# Patient Record
Sex: Female | Born: 1955 | Race: Black or African American | Hispanic: No | Marital: Married | Smoking: Current every day smoker
Health system: Southern US, Community
[De-identification: ages and names within clinical notes are randomized; demographics above are authoritative.]

## PROBLEM LIST (undated history)

## (undated) DIAGNOSIS — M797 Fibromyalgia: Secondary | ICD-10-CM

## (undated) DIAGNOSIS — I2510 Atherosclerotic heart disease of native coronary artery without angina pectoris: Secondary | ICD-10-CM

## (undated) DIAGNOSIS — J45909 Unspecified asthma, uncomplicated: Secondary | ICD-10-CM

## (undated) DIAGNOSIS — E119 Type 2 diabetes mellitus without complications: Secondary | ICD-10-CM

## (undated) DIAGNOSIS — I10 Essential (primary) hypertension: Secondary | ICD-10-CM

## (undated) HISTORY — PX: BREAST MASS EXCISION: SHX1267

## (undated) HISTORY — DX: Fibromyalgia: M79.7

## (undated) HISTORY — PX: BREAST EXCISIONAL BIOPSY: SUR124

---

## 1988-11-15 DIAGNOSIS — E119 Type 2 diabetes mellitus without complications: Secondary | ICD-10-CM

## 1988-11-15 HISTORY — DX: Type 2 diabetes mellitus without complications: E11.9

## 2005-11-16 ENCOUNTER — Other Ambulatory Visit: Payer: Self-pay

## 2005-11-16 ENCOUNTER — Emergency Department: Payer: Self-pay | Admitting: Emergency Medicine

## 2006-05-30 ENCOUNTER — Other Ambulatory Visit: Payer: Self-pay

## 2006-05-30 ENCOUNTER — Emergency Department: Payer: Self-pay | Admitting: Emergency Medicine

## 2006-08-28 ENCOUNTER — Emergency Department: Payer: Self-pay | Admitting: Emergency Medicine

## 2006-08-31 ENCOUNTER — Inpatient Hospital Stay: Payer: Self-pay | Admitting: Internal Medicine

## 2006-11-11 ENCOUNTER — Emergency Department: Payer: Self-pay | Admitting: Emergency Medicine

## 2006-12-28 ENCOUNTER — Ambulatory Visit: Payer: Self-pay | Admitting: Nurse Practitioner

## 2007-02-05 ENCOUNTER — Other Ambulatory Visit: Payer: Self-pay

## 2007-02-06 ENCOUNTER — Inpatient Hospital Stay: Payer: Self-pay | Admitting: Internal Medicine

## 2007-02-07 ENCOUNTER — Inpatient Hospital Stay: Payer: Self-pay | Admitting: Unknown Physician Specialty

## 2007-02-20 ENCOUNTER — Emergency Department: Payer: Self-pay | Admitting: Emergency Medicine

## 2007-04-25 ENCOUNTER — Emergency Department: Payer: Self-pay | Admitting: Emergency Medicine

## 2007-09-14 ENCOUNTER — Emergency Department: Payer: Self-pay

## 2007-09-14 ENCOUNTER — Other Ambulatory Visit: Payer: Self-pay

## 2007-10-25 DIAGNOSIS — I10 Essential (primary) hypertension: Secondary | ICD-10-CM | POA: Diagnosis present

## 2007-11-16 HISTORY — PX: CHOLECYSTECTOMY: SHX55

## 2007-12-28 ENCOUNTER — Ambulatory Visit: Payer: Self-pay | Admitting: Family Medicine

## 2008-01-02 ENCOUNTER — Ambulatory Visit: Payer: Self-pay | Admitting: Gastroenterology

## 2008-04-05 ENCOUNTER — Ambulatory Visit: Payer: Self-pay | Admitting: Family Medicine

## 2008-04-17 ENCOUNTER — Other Ambulatory Visit: Payer: Self-pay

## 2008-04-18 ENCOUNTER — Observation Stay: Payer: Self-pay | Admitting: Internal Medicine

## 2008-04-18 ENCOUNTER — Inpatient Hospital Stay: Payer: Self-pay | Admitting: Psychiatry

## 2008-07-14 ENCOUNTER — Other Ambulatory Visit: Payer: Self-pay

## 2008-07-14 ENCOUNTER — Emergency Department: Payer: Self-pay | Admitting: Emergency Medicine

## 2008-07-19 DIAGNOSIS — E785 Hyperlipidemia, unspecified: Secondary | ICD-10-CM | POA: Insufficient documentation

## 2008-08-08 ENCOUNTER — Emergency Department: Payer: Self-pay | Admitting: Emergency Medicine

## 2008-09-18 ENCOUNTER — Ambulatory Visit: Payer: Self-pay | Admitting: Pain Medicine

## 2008-10-09 ENCOUNTER — Emergency Department: Payer: Self-pay | Admitting: Emergency Medicine

## 2008-10-29 ENCOUNTER — Emergency Department: Payer: Self-pay | Admitting: Internal Medicine

## 2008-10-30 ENCOUNTER — Ambulatory Visit: Payer: Self-pay | Admitting: Internal Medicine

## 2008-11-18 ENCOUNTER — Inpatient Hospital Stay: Payer: Self-pay | Admitting: *Deleted

## 2008-11-19 ENCOUNTER — Inpatient Hospital Stay: Payer: Self-pay | Admitting: Psychiatry

## 2009-01-31 ENCOUNTER — Ambulatory Visit: Payer: Self-pay | Admitting: Family Medicine

## 2009-04-10 ENCOUNTER — Ambulatory Visit: Payer: Self-pay | Admitting: Family Medicine

## 2009-06-06 ENCOUNTER — Emergency Department: Payer: Self-pay | Admitting: Emergency Medicine

## 2009-06-15 ENCOUNTER — Emergency Department: Payer: Self-pay | Admitting: Unknown Physician Specialty

## 2009-08-07 ENCOUNTER — Ambulatory Visit: Payer: Self-pay | Admitting: Gastroenterology

## 2009-11-05 ENCOUNTER — Inpatient Hospital Stay: Payer: Self-pay | Admitting: Student

## 2010-02-22 ENCOUNTER — Inpatient Hospital Stay: Payer: Self-pay | Admitting: Internal Medicine

## 2010-07-01 ENCOUNTER — Ambulatory Visit: Payer: Self-pay | Admitting: Family Medicine

## 2010-09-14 ENCOUNTER — Emergency Department: Payer: Self-pay | Admitting: Emergency Medicine

## 2011-04-16 ENCOUNTER — Emergency Department: Payer: Self-pay | Admitting: Unknown Physician Specialty

## 2011-04-17 ENCOUNTER — Emergency Department: Payer: Self-pay | Admitting: Internal Medicine

## 2011-12-20 ENCOUNTER — Emergency Department: Payer: Self-pay | Admitting: Emergency Medicine

## 2011-12-20 LAB — CBC
HCT: 42.8 % (ref 35.0–47.0)
HGB: 14.7 g/dL (ref 12.0–16.0)
MCH: 31.1 pg (ref 26.0–34.0)
MCHC: 34.4 g/dL (ref 32.0–36.0)
MCV: 91 fL (ref 80–100)
Platelet: 307 10*3/uL (ref 150–440)
RBC: 4.73 10*6/uL (ref 3.80–5.20)
RDW: 14.5 % (ref 11.5–14.5)
WBC: 8.7 10*3/uL (ref 3.6–11.0)

## 2011-12-20 LAB — URINALYSIS, COMPLETE
Bacteria: NONE SEEN
Bilirubin,UR: NEGATIVE
Blood: NEGATIVE
Glucose,UR: >=500 mg/dL (ref 0–75)
Leukocyte Esterase: NEGATIVE
Nitrite: NEGATIVE
Ph: 7 (ref 4.5–8.0)
Protein: NEGATIVE
RBC,UR: <1 /HPF (ref 0–5)
Specific Gravity: 1.018 (ref 1.003–1.030)
Squamous Epithelial: 3
WBC UR: <1 /HPF (ref 0–5)

## 2011-12-20 LAB — COMPREHENSIVE METABOLIC PANEL
Albumin: 3.6 g/dL (ref 3.4–5.0)
Alkaline Phosphatase: 113 U/L (ref 50–136)
Anion Gap: 6 — ABNORMAL LOW (ref 7–16)
BUN: 8 mg/dL (ref 7–18)
Bilirubin,Total: 0.3 mg/dL (ref 0.2–1.0)
Calcium, Total: 9.1 mg/dL (ref 8.5–10.1)
Chloride: 103 mmol/L (ref 98–107)
Co2: 28 mmol/L (ref 21–32)
Creatinine: 0.64 mg/dL (ref 0.60–1.30)
EGFR (African American): >60
EGFR (Non-African Amer.): >60
Glucose: 212 mg/dL — ABNORMAL HIGH (ref 65–99)
Osmolality: 278 (ref 275–301)
Potassium: 3.9 mmol/L (ref 3.5–5.1)
SGOT(AST): 16 U/L (ref 15–37)
SGPT (ALT): 20 U/L
Sodium: 137 mmol/L (ref 136–145)
Total Protein: 7.8 g/dL (ref 6.4–8.2)

## 2011-12-20 LAB — LIPASE, BLOOD: Lipase: 334 U/L (ref 73–393)

## 2011-12-20 LAB — TROPONIN I: Troponin-I: <0.02 ng/mL

## 2012-08-16 ENCOUNTER — Ambulatory Visit: Payer: Self-pay | Admitting: Family Medicine

## 2013-04-18 DIAGNOSIS — E1140 Type 2 diabetes mellitus with diabetic neuropathy, unspecified: Secondary | ICD-10-CM | POA: Insufficient documentation

## 2013-07-14 ENCOUNTER — Emergency Department: Payer: Self-pay | Admitting: Emergency Medicine

## 2013-07-14 LAB — URINALYSIS, COMPLETE
Bacteria: NONE SEEN
Bilirubin,UR: NEGATIVE
Glucose,UR: >=500 mg/dL (ref 0–75)
Nitrite: NEGATIVE
Ph: 6 (ref 4.5–8.0)
Protein: NEGATIVE
RBC,UR: 4 /HPF (ref 0–5)
Specific Gravity: 1.025 (ref 1.003–1.030)
Squamous Epithelial: 1
WBC UR: 19 /HPF (ref 0–5)

## 2013-07-14 LAB — COMPREHENSIVE METABOLIC PANEL
Albumin: 3.7 g/dL (ref 3.4–5.0)
Alkaline Phosphatase: 118 U/L (ref 50–136)
Anion Gap: 6 — ABNORMAL LOW (ref 7–16)
BUN: 12 mg/dL (ref 7–18)
Bilirubin,Total: 0.3 mg/dL (ref 0.2–1.0)
Calcium, Total: 9.3 mg/dL (ref 8.5–10.1)
Chloride: 95 mmol/L — ABNORMAL LOW (ref 98–107)
Co2: 31 mmol/L (ref 21–32)
Creatinine: 0.91 mg/dL (ref 0.60–1.30)
EGFR (African American): >60
EGFR (Non-African Amer.): >60
Glucose: 433 mg/dL — ABNORMAL HIGH (ref 65–99)
Osmolality: 283 (ref 275–301)
Potassium: 4.3 mmol/L (ref 3.5–5.1)
SGOT(AST): 16 U/L (ref 15–37)
SGPT (ALT): 20 U/L (ref 12–78)
Sodium: 132 mmol/L — ABNORMAL LOW (ref 136–145)
Total Protein: 7.9 g/dL (ref 6.4–8.2)

## 2013-07-14 LAB — CBC
HCT: 42.5 % (ref 35.0–47.0)
HGB: 14.7 g/dL (ref 12.0–16.0)
MCH: 32.3 pg (ref 26.0–34.0)
MCHC: 34.6 g/dL (ref 32.0–36.0)
MCV: 93 fL (ref 80–100)
Platelet: 361 10*3/uL (ref 150–440)
RBC: 4.55 10*6/uL (ref 3.80–5.20)
RDW: 13.4 % (ref 11.5–14.5)
WBC: 10.3 10*3/uL (ref 3.6–11.0)

## 2013-08-28 ENCOUNTER — Ambulatory Visit: Payer: Self-pay | Admitting: Family Medicine

## 2013-11-01 DIAGNOSIS — Z794 Long term (current) use of insulin: Secondary | ICD-10-CM | POA: Insufficient documentation

## 2014-03-01 ENCOUNTER — Ambulatory Visit: Payer: Self-pay | Admitting: Family Medicine

## 2015-01-22 ENCOUNTER — Ambulatory Visit: Payer: Self-pay | Admitting: Nurse Practitioner

## 2015-10-21 ENCOUNTER — Other Ambulatory Visit: Payer: Self-pay | Admitting: Nurse Practitioner

## 2015-10-21 DIAGNOSIS — Z1231 Encounter for screening mammogram for malignant neoplasm of breast: Secondary | ICD-10-CM

## 2017-02-22 DIAGNOSIS — R42 Dizziness and giddiness: Secondary | ICD-10-CM | POA: Insufficient documentation

## 2017-03-01 ENCOUNTER — Emergency Department: Payer: Self-pay

## 2017-03-01 ENCOUNTER — Encounter: Payer: Self-pay | Admitting: *Deleted

## 2017-03-01 ENCOUNTER — Emergency Department
Admission: EM | Admit: 2017-03-01 | Discharge: 2017-03-01 | Disposition: A | Discharge status: Home / Self Care | Payer: Self-pay | Attending: Emergency Medicine | Admitting: Emergency Medicine

## 2017-03-01 DIAGNOSIS — Z5321 Procedure and treatment not carried out due to patient leaving prior to being seen by health care provider: Secondary | ICD-10-CM | POA: Insufficient documentation

## 2017-03-01 DIAGNOSIS — F17210 Nicotine dependence, cigarettes, uncomplicated: Secondary | ICD-10-CM | POA: Insufficient documentation

## 2017-03-01 DIAGNOSIS — R079 Chest pain, unspecified: Secondary | ICD-10-CM | POA: Insufficient documentation

## 2017-03-01 DIAGNOSIS — R109 Unspecified abdominal pain: Secondary | ICD-10-CM | POA: Insufficient documentation

## 2017-03-01 DIAGNOSIS — I10 Essential (primary) hypertension: Secondary | ICD-10-CM | POA: Insufficient documentation

## 2017-03-01 DIAGNOSIS — E119 Type 2 diabetes mellitus without complications: Secondary | ICD-10-CM | POA: Insufficient documentation

## 2017-03-01 DIAGNOSIS — J45909 Unspecified asthma, uncomplicated: Secondary | ICD-10-CM | POA: Insufficient documentation

## 2017-03-01 DIAGNOSIS — I2510 Atherosclerotic heart disease of native coronary artery without angina pectoris: Secondary | ICD-10-CM | POA: Insufficient documentation

## 2017-03-01 HISTORY — DX: Essential (primary) hypertension: I10

## 2017-03-01 HISTORY — DX: Unspecified asthma, uncomplicated: J45.909

## 2017-03-01 HISTORY — DX: Atherosclerotic heart disease of native coronary artery without angina pectoris: I25.10

## 2017-03-01 HISTORY — DX: Type 2 diabetes mellitus without complications: E11.9

## 2017-03-01 LAB — CBC
HCT: 40.2 % (ref 35.0–47.0)
Hemoglobin: 14 g/dL (ref 12.0–16.0)
MCH: 31.4 pg (ref 26.0–34.0)
MCHC: 34.8 g/dL (ref 32.0–36.0)
MCV: 90.4 fL (ref 80.0–100.0)
Platelets: 431 10*3/uL (ref 150–440)
RBC: 4.44 MIL/uL (ref 3.80–5.20)
RDW: 13.7 % (ref 11.5–14.5)
WBC: 9.5 10*3/uL (ref 3.6–11.0)

## 2017-03-01 LAB — BASIC METABOLIC PANEL
Anion gap: 9 (ref 5–15)
BUN: 15 mg/dL (ref 6–20)
CO2: 28 mmol/L (ref 22–32)
Calcium: 9.4 mg/dL (ref 8.9–10.3)
Chloride: 98 mmol/L — ABNORMAL LOW (ref 101–111)
Creatinine, Ser: 0.68 mg/dL (ref 0.44–1.00)
GFR calc Af Amer: >60 mL/min (ref >60)
GFR calc non Af Amer: >60 mL/min (ref >60)
Glucose, Bld: 278 mg/dL — ABNORMAL HIGH (ref 65–99)
Potassium: 4 mmol/L (ref 3.5–5.1)
Sodium: 135 mmol/L (ref 135–145)

## 2017-03-01 LAB — LIPASE, BLOOD: Lipase: 151 U/L — ABNORMAL HIGH (ref 11–51)

## 2017-03-01 LAB — TROPONIN I: Troponin I: <0.03 ng/mL (ref <0.03)

## 2017-03-01 NOTE — ED Triage Notes (Signed)
Pt complains of abdominal pain with nausea and intermittent chest pain and dizziness at times for the last week

## 2017-03-02 ENCOUNTER — Telehealth: Payer: Self-pay | Admitting: Emergency Medicine

## 2017-03-02 NOTE — Telephone Encounter (Signed)
Called patient due to lwot to inquire about condition and follow up plans. She says she is still sick and has called [facility] already.  I will fax her results to [facility].  Patient says she still feels bad with abdominal discomfort and dizziness.  Says chest pain has happened, but  That was not all the time.  Called [facility] and faxed results to carolyn.

## 2017-07-27 ENCOUNTER — Encounter: Payer: Self-pay | Admitting: Emergency Medicine

## 2017-07-27 ENCOUNTER — Emergency Department: Payer: Medicare HMO

## 2017-07-27 ENCOUNTER — Observation Stay
Admission: EM | Admit: 2017-07-27 | Discharge: 2017-07-28 | Disposition: A | Discharge status: Home / Self Care | Payer: Medicare HMO | Attending: Internal Medicine | Admitting: Internal Medicine

## 2017-07-27 DIAGNOSIS — Z79899 Other long term (current) drug therapy: Secondary | ICD-10-CM | POA: Insufficient documentation

## 2017-07-27 DIAGNOSIS — Z7982 Long term (current) use of aspirin: Secondary | ICD-10-CM | POA: Diagnosis not present

## 2017-07-27 DIAGNOSIS — F17210 Nicotine dependence, cigarettes, uncomplicated: Secondary | ICD-10-CM | POA: Diagnosis not present

## 2017-07-27 DIAGNOSIS — Z7984 Long term (current) use of oral hypoglycemic drugs: Secondary | ICD-10-CM | POA: Insufficient documentation

## 2017-07-27 DIAGNOSIS — R000 Tachycardia, unspecified: Secondary | ICD-10-CM | POA: Diagnosis not present

## 2017-07-27 DIAGNOSIS — I10 Essential (primary) hypertension: Secondary | ICD-10-CM | POA: Diagnosis not present

## 2017-07-27 DIAGNOSIS — J45909 Unspecified asthma, uncomplicated: Secondary | ICD-10-CM | POA: Insufficient documentation

## 2017-07-27 DIAGNOSIS — E119 Type 2 diabetes mellitus without complications: Secondary | ICD-10-CM | POA: Diagnosis not present

## 2017-07-27 DIAGNOSIS — I2510 Atherosclerotic heart disease of native coronary artery without angina pectoris: Secondary | ICD-10-CM | POA: Diagnosis not present

## 2017-07-27 DIAGNOSIS — G459 Transient cerebral ischemic attack, unspecified: Secondary | ICD-10-CM | POA: Diagnosis present

## 2017-07-27 DIAGNOSIS — R4702 Dysphasia: Secondary | ICD-10-CM

## 2017-07-27 DIAGNOSIS — R4781 Slurred speech: Secondary | ICD-10-CM | POA: Diagnosis present

## 2017-07-27 LAB — PROTIME-INR
INR: 0.9
Prothrombin Time: 12.1 seconds (ref 11.4–15.2)

## 2017-07-27 LAB — COMPREHENSIVE METABOLIC PANEL
ALT: 37 U/L (ref 14–54)
AST: 56 U/L — ABNORMAL HIGH (ref 15–41)
Albumin: 4 g/dL (ref 3.5–5.0)
Alkaline Phosphatase: 95 U/L (ref 38–126)
Anion gap: 11 (ref 5–15)
BUN: 11 mg/dL (ref 6–20)
CO2: 28 mmol/L (ref 22–32)
Calcium: 9.4 mg/dL (ref 8.9–10.3)
Chloride: 102 mmol/L (ref 101–111)
Creatinine, Ser: 0.63 mg/dL (ref 0.44–1.00)
GFR calc Af Amer: >60 mL/min (ref >60)
GFR calc non Af Amer: >60 mL/min (ref >60)
Glucose, Bld: 122 mg/dL — ABNORMAL HIGH (ref 65–99)
Potassium: 3.8 mmol/L (ref 3.5–5.1)
Sodium: 141 mmol/L (ref 135–145)
Total Bilirubin: 0.5 mg/dL (ref 0.3–1.2)
Total Protein: 7.6 g/dL (ref 6.5–8.1)

## 2017-07-27 LAB — DIFFERENTIAL
Basophils Absolute: 0 10*3/uL (ref 0–0.1)
Basophils Relative: 0 %
Eosinophils Absolute: 0 10*3/uL (ref 0–0.7)
Eosinophils Relative: 0 %
Lymphocytes Relative: 13 %
Lymphs Abs: 1.5 10*3/uL (ref 1.0–3.6)
Monocytes Absolute: 0.6 10*3/uL (ref 0.2–0.9)
Monocytes Relative: 6 %
Neutro Abs: 8.9 10*3/uL — ABNORMAL HIGH (ref 1.4–6.5)
Neutrophils Relative %: 81 %

## 2017-07-27 LAB — CBC
HCT: 37.9 % (ref 35.0–47.0)
Hemoglobin: 13.1 g/dL (ref 12.0–16.0)
MCH: 31.7 pg (ref 26.0–34.0)
MCHC: 34.5 g/dL (ref 32.0–36.0)
MCV: 91.8 fL (ref 80.0–100.0)
Platelets: 415 10*3/uL (ref 150–440)
RBC: 4.13 MIL/uL (ref 3.80–5.20)
RDW: 13.9 % (ref 11.5–14.5)
WBC: 11.1 10*3/uL — ABNORMAL HIGH (ref 3.6–11.0)

## 2017-07-27 LAB — GLUCOSE, CAPILLARY
Glucose-Capillary: 143 mg/dL — ABNORMAL HIGH (ref 65–99)
Glucose-Capillary: 130 mg/dL — ABNORMAL HIGH (ref 65–99)

## 2017-07-27 LAB — TROPONIN I: Troponin I: <0.03 ng/mL (ref <0.03)

## 2017-07-27 LAB — APTT: aPTT: 33 seconds (ref 24–36)

## 2017-07-27 MED ORDER — GABAPENTIN 300 MG PO CAPS
300.0000 mg | ORAL_CAPSULE | Freq: Every day | ORAL | Status: DC
  Filled 2017-07-27: qty 1

## 2017-07-27 MED ORDER — FLUOXETINE HCL 20 MG PO CAPS
40.0000 mg | ORAL_CAPSULE | Freq: Every day | ORAL | Status: DC
  Administered 2017-07-28: 40 mg via ORAL
  Filled 2017-07-27: qty 2

## 2017-07-27 MED ORDER — ACETAMINOPHEN 325 MG PO TABS
650.0000 mg | ORAL_TABLET | ORAL | Status: DC | PRN
  Administered 2017-07-28: 650 mg via ORAL
  Filled 2017-07-27: qty 2

## 2017-07-27 MED ORDER — INSULIN ASPART 100 UNIT/ML ~~LOC~~ SOLN
0.0000 [IU] | Freq: Three times a day (TID) | SUBCUTANEOUS | Status: DC
  Administered 2017-07-28: 2 [IU] via SUBCUTANEOUS
  Administered 2017-07-28: 5 [IU] via SUBCUTANEOUS
  Filled 2017-07-27 (×2): qty 1

## 2017-07-27 MED ORDER — ACETAMINOPHEN 650 MG RE SUPP
650.0000 mg | RECTAL | Status: DC | PRN
Start: 2017-07-27 — End: 2017-07-28

## 2017-07-27 MED ORDER — STROKE: EARLY STAGES OF RECOVERY BOOK
Freq: Once | Status: AC
  Administered 2017-07-27

## 2017-07-27 MED ORDER — AMITRIPTYLINE HCL 25 MG PO TABS: 50.0000 mg | ORAL_TABLET | Freq: Every evening | ORAL | Status: DC | PRN

## 2017-07-27 MED ORDER — ASPIRIN 81 MG PO CHEW
324.0000 mg | CHEWABLE_TABLET | Freq: Once | ORAL | Status: DC
Start: 2017-07-27 — End: 2017-07-27

## 2017-07-27 MED ORDER — ACETAMINOPHEN 160 MG/5ML PO SOLN
650.0000 mg | ORAL | Status: DC | PRN
  Filled 2017-07-27: qty 20.3

## 2017-07-27 MED ORDER — ENOXAPARIN SODIUM 40 MG/0.4ML ~~LOC~~ SOLN
40.0000 mg | SUBCUTANEOUS | Status: DC
  Administered 2017-07-27: 40 mg via SUBCUTANEOUS
  Filled 2017-07-27: qty 0.4

## 2017-07-27 NOTE — ED Provider Notes (Signed)
[facility] Emergency Department Provider Note  Time seen: 6:54 PM  I have reviewed the triage vital signs and the nursing notes.   HISTORY  Chief Complaint Aphasia    HPI Kristie Olson is a 61 y.o. female With a past medical history of asthma, diabetes, hypertension, presents to the emergency department for difficulty speaking. According to the patient she went to bed normal last night but awoke this morning with difficulty speaking. Per patient she was slurring her words and could not get out the words she was thinking. Patient was hoping this would improve on its own but it did not so eventually she called EMS. Patient was found have a blood sugar in the mid 50s was given an amp of dextrose, with improvement in blood sugar but no improvement in slurred speech. Patient denies any history of slurred speech or stroke in the past. Denies any weakness, numbness of any specific arm or leg. Denies any headache. Upon arrival to the emergency department the patient continues have mild slurred speech which appears to be somewhat intermittent. She states her symptoms are much improved but remained present.  Past Medical History:  Diagnosis Date  . Asthma   . Coronary artery disease   . Diabetes mellitus without complication ([place])   . Hypertension     There are no active problems to display for this patient.   Past Surgical History:  Procedure Laterality Date  . CESAREAN SECTION      Prior to Admission medications   Medication Sig Start Date End Date Taking? Authorizing Provider  amitriptyline (ELAVIL) 100 MG tablet Take 50 mg by mouth at bedtime as needed for sleep.   Yes [provider]  FLUoxetine (PROZAC) 40 MG capsule Take 40 mg by mouth daily.   Yes [provider]  gabapentin (NEURONTIN) 100 MG capsule Take 300 mg by mouth at bedtime.   Yes [provider]  glipiZIDE (GLUCOTROL) 10 MG tablet Take 10 mg by mouth daily before  breakfast.   Yes [provider]  lisinopril-hydrochlorothiazide (PRINZIDE,ZESTORETIC) 20-25 MG tablet Take 1 tablet by mouth daily.   Yes [provider]    No Known Allergies  No family history on file.  Social History Social History  Substance Use Topics  . Smoking status: Current Every Day Smoker    Packs/day: 0.50    Types: Cigarettes  . Smokeless tobacco: Never Used  . Alcohol use No    Review of Systems Constitutional: Negative for fever. Cardiovascular: Negative for chest pain. Respiratory: Negative for shortness of breath. Gastrointestinal: Negative for abdominal pain Neurological: Negative for headaches, focal weakness or numbness. positive for slurred speech. All other ROS negative  ____________________________________________   PHYSICAL EXAM:  VITAL SIGNS: ED Triage Vitals  Enc Vitals Group     BP 07/27/17 1724 136/78     Pulse Rate 07/27/17 1724 100     Resp 07/27/17 1724 14     Temp 07/27/17 1724 98.5 F (36.9 C)     Temp Source 07/27/17 1724 Oral     SpO2 07/27/17 1724 95 %     Weight 07/27/17 1720 216 lb (98 kg)     Height 07/27/17 1720 5' (1.524 m)     Head Circumference --      Peak Flow --      Pain Score 07/27/17 1720 5     Pain Loc --      Pain Edu? --      Excl. in  GC? --     Constitutional: Alert and oriented. Well appearing and in no distress. Eyes: Normal exam ENT   Head: Normocephalic and atraumatic.   Nose: No congestion/rhinnorhea.   Mouth/Throat: Mucous membranes are moist.  patient's time. Slightly enlarged however the patient states it is normal. Cardiovascular: Normal rate, regular rhythm.  Respiratory: Normal respiratory effort without tachypnea nor retractions. Breath sounds are clear  Gastrointestinal: Soft and nontender. No distention.   Musculoskeletal: Nontender with normal range of motion in all extremities.  Neurologic:  normal language, but fairly significant slurred speech at times.  No  gross focal neurologic deficits are appreciated.  cranial nerves are normal. No pronator drift. Skin:  Skin is warm, dry and intact.  Psychiatric: Mood and affect are normal.   ____________________________________________    EKG  EKG reviewed and interpreted by myself shows sinus tachycardia 103 bpm, narrow QRS, normal axis, origin normal intervals with nonspecific ST changes without ST elevation.  ____________________________________________    RADIOLOGY  CT head negative  ____________________________________________   INITIAL IMPRESSION / ASSESSMENT AND PLAN / ED COURSE  Pertinent labs & imaging results that were available during my care of the patient were reviewed by me and considered in my medical decision making (see chart for details).  patient presents to the emergency Department for slurred speech upon awakening this morning. Patient continues to have slurred speech in the emergency department although she states it is much improved from earlier today. Given the patient's continued slurred speech although improving I believe the patient is likely suffering from a mild CVA versus TIA. Patient's workup is otherwise normal. Labs are within normal limits. CT negative. Patient's neurological exam besides slurred speech is intact and normal. We'll admit the patient for a TIA workup. Patient agreeable to plan.  ____________________________________________   FINAL CLINICAL IMPRESSION(S) / ED DIAGNOSES  transient ischemic attack Slurred speech    Harvest Dark, MD 07/27/17 1901

## 2017-07-27 NOTE — ED Notes (Signed)
Patient transported to CT 

## 2017-07-27 NOTE — ED Notes (Signed)
ED Provider at bedside. 

## 2017-07-27 NOTE — ED Triage Notes (Addendum)
Pt comes into the ED via [facility] where her family called EMS due to her having slurred speech.  Patient states late last evening was the last time she had normal speech.  Patient has negative NIH screen with EMs.  States she had chest pain last night and took a 325 asp.  At home.  Patient presents with slurred speech, VS stable, and alert and oriented at this time. Initial CBG with EMS was 54, patient given 1 tube of dextrose and CBg then was 95.  Slurred speech still present after dextrose administration.

## 2017-07-27 NOTE — H&P (Signed)
[name] [place] NAME: Kristie Olson    MR#:  [id number]  DATE OF BIRTH:  [birth year]  DATE OF ADMISSION:  07/27/2017  PRIMARY CARE PHYSICIAN: [facility]   REQUESTING/REFERRING PHYSICIAN: Dr.Kevin Paduchowski  CHIEF COMPLAINT: Slurred Education officer, community Complaint  Patient presents with  . Aphasia    HISTORY OF PRESENT ILLNESS:  Kristie Olson  is a 61 y.o. female with a known history ofHistory of CAD, diabetes mellitus type 2, asthma comes in because of slurred speech. Patient woke up with slurred speech, continued to have slurred speech so she called EMS. EMS found that she was hypoglycemic with sugars in the 50s. They gave dextrose without any improvement in slurred speech. Patient continued to have slurred speech. She told me that she had headache last night. No blurred vision. Complains of right-sided weakness. Patient has history of falls and also uses cane.  PAST MEDICAL HISTORY:   Past Medical History:  Diagnosis Date  . Asthma   . Coronary artery disease   . Diabetes mellitus without complication ([place])   . Hypertension     PAST SURGICAL HISTOIRY:   Past Surgical History:  Procedure Laterality Date  . CESAREAN SECTION      SOCIAL HISTORY:   Social History  Substance Use Topics  . Smoking status: Current Every Day Smoker    Packs/day: 0.50    Types: Cigarettes  . Smokeless tobacco: Never Used  . Alcohol use No    FAMILY HISTORY:  No family history on file.  DRUG ALLERGIES:  No Known Allergies  REVIEW OF SYSTEMS:  CONSTITUTIONAL: No fever, fatigue or weakness.  EYES: No blurred or double vision.  EARS, NOSE, AND THROAT: No tinnitus or ear pain.  RESPIRATORY: No cough, shortness of breath, wheezing or hemoptysis.  CARDIOVASCULAR: No chest pain, orthopnea, edema.  GASTROINTESTINAL: No nausea, vomiting, diarrhea or abdominal pain.  GENITOURINARY: No dysuria, hematuria.  ENDOCRINE:  No polyuria, nocturia,  HEMATOLOGY: No anemia, easy bruising or bleeding SKIN: No rash or lesion. MUSCULOSKELETAL: No joint pain or arthritis.   NEUROLOGIC: Slurred speech, headache, right-sided weakness  PSYCHIATRY: No anxiety or depression.   MEDICATIONS AT HOME:   Prior to Admission medications   Medication Sig Start Date End Date Taking? Authorizing Provider  amitriptyline (ELAVIL) 100 MG tablet Take 50 mg by mouth at bedtime as needed for sleep.   Yes [provider]  FLUoxetine (PROZAC) 40 MG capsule Take 40 mg by mouth daily.   Yes [provider]  gabapentin (NEURONTIN) 100 MG capsule Take 300 mg by mouth at bedtime.   Yes [provider]  glipiZIDE (GLUCOTROL) 10 MG tablet Take 10 mg by mouth daily before breakfast.   Yes [provider]  lisinopril-hydrochlorothiazide (PRINZIDE,ZESTORETIC) 20-25 MG tablet Take 1 tablet by mouth daily.   Yes [provider]      VITAL SIGNS:  Blood pressure 133/66, pulse 91, temperature 98.5 F (36.9 C), temperature source Oral, resp. rate 12, height 5' (1.524 m), weight 98 kg (216 lb), SpO2 97 %.  PHYSICAL EXAMINATION:  GENERAL:  61 y.o.-year-old patient lying in the bed with no acute distress.  EYES: Pupils equal, round, reactive to light and accommodation. No scleral icterus. Extraocular muscles intact.  HEENT: Head atraumatic, normocephalic. Oropharynx and nasopharynx clear.  NECK:  Supple, no jugular venous distention. No thyroid enlargement, no tenderness.  LUNGS: Normal breath sounds bilaterally, no wheezing, rales,rhonchi or crepitation. No  use of accessory muscles of respiration.  CARDIOVASCULAR: S1, S2 normal. No murmurs, rubs, or gallops.  ABDOMEN: Soft, nontender, nondistended. Bowel sounds present. No organomegaly or mass.  EXTREMITIES: No pedal edema, cyanosis, or clubbing.  NEUROLOGIC: Cranial nerves II through XII are intact. Decreased muscle strength in the right side compared to  left side. Also has slurred speech but no facial droop.  PSYCHIATRIC: The patient is alert and oriented x 3.  SKIN: No obvious rash, lesion, or ulcer.   LABORATORY PANEL:   CBC  Recent Labs Lab 07/27/17 1721  WBC 11.1*  HGB 13.1  HCT 37.9  PLT 415   ------------------------------------------------------------------------------------------------------------------  Chemistries   Recent Labs Lab 07/27/17 1721  NA 141  K 3.8  CL 102  CO2 28  GLUCOSE 122*  BUN 11  CREATININE 0.63  CALCIUM 9.4  AST 56*  ALT 37  ALKPHOS 95  BILITOT 0.5   ------------------------------------------------------------------------------------------------------------------  Cardiac Enzymes  Recent Labs Lab 07/27/17 1721  TROPONINI <0.03   ------------------------------------------------------------------------------------------------------------------  RADIOLOGY:  Ct Head Wo Contrast  Result Date: 07/27/2017 CLINICAL DATA:  Slurred speech. EXAM: CT HEAD WITHOUT CONTRAST TECHNIQUE: Contiguous axial images were obtained from the base of the skull through the vertex without intravenous contrast. COMPARISON:  09/14/2010 FINDINGS: Brain: No acute intracranial abnormality. Specifically, no hemorrhage, hydrocephalus, mass lesion, acute infarction, or significant intracranial injury. Vascular: No hyperdense vessel or unexpected calcification. Skull: No acute calvarial abnormality. Sinuses/Orbits: Visualized paranasal sinuses and mastoids clear. Orbital soft tissues unremarkable. Other: None IMPRESSION: No acute intracranial abnormality. Electronically Signed   By: Rolm Baptise M.D.   On: 07/27/2017 17:41    EKG:   Orders placed or performed during the hospital encounter of 07/27/17  . ED EKG  . ED EKG  . EKG 12-Lead  . EKG 12-Lead  Sinus tachycardia 10 3 bpm  IMPRESSION AND PLAN:   #23.61 year old female patient with slurred speech and right-sided weakness: Patient still has slurred speech.  Admit to stroke unit under observation, Ultrasound of carotids, echocardiogram, MRI of the brain. Start aspirin, check fasting lipids #2/diabetes mellitus type 2 with hypoglycemia today: Continue sliding scale insulin coverage, stop oral hypoglycemics. #3. history of depression: Continue amitriptyline 50 mg as needed for sleep Continue Prozac 40 MG daily #4/ history of hypertension: Patient is on lisinopril/HCTZ.       All the records are reviewed and case discussed with ED provider. Management plans discussed with the patient, family and they are in agreement.  CODE STATUS: Full code  TOTAL TIME TAKING CARE OF THIS PATIENT: 55 minutes.    Epifanio Lesches M.D on 07/27/2017 at 8:46 PM  Between 7am to 6pm - Pager - [phone]  After 6pm go to [email or web address] - password [email or web address] [facility]  Office  [phone]  CC: Primary care physician; [facility]  Note: This dictation was prepared with Dragon dictation along with smaller phrase technology. Any transcriptional errors that result from this process are unintentional.

## 2017-07-27 NOTE — ED Notes (Signed)
Patient completed MRI screening with tech.

## 2017-07-28 ENCOUNTER — Observation Stay: Payer: Medicare HMO

## 2017-07-28 ENCOUNTER — Observation Stay (HOSPITAL_BASED_OUTPATIENT_CLINIC_OR_DEPARTMENT_OTHER)
Admit: 2017-07-28 | Discharge: 2017-07-28 | Disposition: A | Discharge status: Home / Self Care | Payer: Medicare HMO | Attending: Internal Medicine | Admitting: Internal Medicine

## 2017-07-28 DIAGNOSIS — G451 Carotid artery syndrome (hemispheric): Secondary | ICD-10-CM

## 2017-07-28 DIAGNOSIS — I5030 Unspecified diastolic (congestive) heart failure: Secondary | ICD-10-CM | POA: Diagnosis not present

## 2017-07-28 LAB — LIPID PANEL
Cholesterol: 138 mg/dL (ref 0–200)
HDL: 39 mg/dL — ABNORMAL LOW (ref >40)
LDL Cholesterol: 72 mg/dL (ref 0–99)
Total CHOL/HDL Ratio: 3.5 RATIO
Triglycerides: 133 mg/dL (ref <150)
VLDL: 27 mg/dL (ref 0–40)

## 2017-07-28 LAB — HEMOGLOBIN A1C
Hgb A1c MFr Bld: 9.6 % — ABNORMAL HIGH (ref 4.8–5.6)
Mean Plasma Glucose: 228.82 mg/dL

## 2017-07-28 LAB — ECHOCARDIOGRAM COMPLETE
Height: 60 in
Weight: 3456 oz

## 2017-07-28 LAB — GLUCOSE, CAPILLARY: Glucose-Capillary: 155 mg/dL — ABNORMAL HIGH (ref 65–99)

## 2017-07-28 MED ORDER — SALINE SPRAY 0.65 % NA SOLN
1.0000 | NASAL | Status: DC | PRN
Start: 2017-07-28 — End: 2017-07-28
  Filled 2017-07-28: qty 44

## 2017-07-28 MED ORDER — KETOROLAC TROMETHAMINE 30 MG/ML IJ SOLN
30.0000 mg | Freq: Once | INTRAMUSCULAR | Status: AC
  Administered 2017-07-28: 30 mg via INTRAVENOUS
  Filled 2017-07-28: qty 1

## 2017-07-28 MED ORDER — ATORVASTATIN CALCIUM 10 MG PO TABS: 10.0000 mg | ORAL_TABLET | Freq: Every day | ORAL | 0 refills | Status: DC

## 2017-07-28 MED ORDER — ASPIRIN EC 81 MG PO TBEC: 81.0000 mg | DELAYED_RELEASE_TABLET | Freq: Every day | ORAL | Status: DC

## 2017-07-28 NOTE — Progress Notes (Signed)
OT Cancellation Note  Patient Details Name: Kristie Olson MRN: [id number] DOB: 25-Sep-1956   Cancelled Treatment:    Reason Eval/Treat Not Completed: Patient at procedure or test/ unavailable. Order received, chart reviewed. Pt out of room for testing upon attempt. Will re-attempt OT evaluation at later date/time as pt is available and as schedule permits.   Jeni Salles, MPH, MS, OTR/L ascom [phone] 07/28/17, 10:33 AM

## 2017-07-28 NOTE — Care Management Note (Addendum)
Case Management Note  Patient Details  Name: Kristie Olson MRN: [id number] Date of Birth: 13-Aug-1956  Subjective/Objective:      Admitted to [facility] under observation status with the problem of slurred speech. Lives with husband, Leandro Reasoner [phone]), Last was at [facility] a month ago. Prescriptions are filled per mail order, [facility], or [facility]. Home Health 2-3 years ago. Doesn't remember name of agency. No skilled nursing. No home oxygen. Home nebulizer and cane. Takes care of all basic and instrumental activities of daily living herself, drives. [place] [address] last month. Good appetite. Husband will transport.            Action/Plan: Physical therapy evaluation completed. Recommending Home Health and Physical therapy. Requested [facility]. Also, youth rolling walker.. Doesn't want to wait for walker.  Will update Floydene Flock, [facility] representative.    Expected Discharge Date:                  Expected Discharge Plan:     In-House Referral:     Discharge planning Services     Post Acute Care Choice:   yes Choice offered to:   Ms. Almyra Deforest   DME Arranged:   yes [facility]  HH Arranged: yes    HH Agency:   [facility]  Status of Service:     If discussed at [place] of Stay Meetings, dates discussed:    Additional Comments:  Shelbie Ammons, RN MSN CCM Care Management [phone] 07/28/2017, 9:06 AM

## 2017-07-28 NOTE — Progress Notes (Signed)
OT Cancellation Note  Patient Details Name: Kristie Olson MRN: [id number] DOB: Aug 10, 1956   Cancelled Treatment:    Reason Eval/Treat Not Completed: Patient at procedure or test/ unavailable. Upon 2nd attempt, pt in process of discharging from hospital. Per chart review, pt back to baseline functional status. Will sign off. Please re-consult as needed.  Jeni Salles, MPH, MS, OTR/L ascom [phone] 07/28/17, 3:13 PM

## 2017-07-28 NOTE — Evaluation (Signed)
Clinical/Bedside Swallow Evaluation Patient Details  Name: Kristie Olson MRN: [id number] Date of Birth: [birth year]  Today's Date: 07/28/2017 Time: SLP Start Time (ACUTE ONLY): 0900 SLP Stop Time (ACUTE ONLY): 1000 SLP Time Calculation (min) (ACUTE ONLY): 60 min  Past Medical History:  Past Medical History:  Diagnosis Date  . Asthma   . Coronary artery disease   . Diabetes mellitus without complication ([place])   . Hypertension    Past Surgical History:  Past Surgical History:  Procedure Laterality Date  . CESAREAN SECTION     HPI:  Pt is a 61 y.o. female with a known history of HTN, CAD, diabetes mellitus type 2, asthma comes in because of slurred speech. Patient woke up with slurred speech, continued to have slurred speech so she called EMS. EMS found that she was hypoglycemic with sugars in the 50s. They gave dextrose without any improvement in slurred speech. Patient continued to have slurred speech. She told me that she had headache last night. No blurred vision. Complains of right-sided weakness. Patient has history of falls and also uses cane. Currently, pt stated her speech is "fine, I'm talking just fine" - pt is edentulous and does not wear dentures which can impact precise articulation of speech. Pt is following all commands; engaged in conversation. Pt is requesting food/drink.    Assessment / Plan / Recommendation Clinical Impression  Pt appears to present w/ no overt oropharyngeal phase dysphagia; she appears at reduced risk for aspiration when following general aspiration precautions w/ oral intake. Pt consumed po trials w/ no overt s/s of aspiration; no decline in vocal quality or respiratory status w/ po trials. Oral phase wfl for bolus management and clearing; pt is edentulous and does take min longer to chew the solid foods sufficiently for swallowing. Pt fed self. Recommend a regular consistency diet w/ thin liquids; general aspiration precautions. As pt appears at her  baseline w/ swallowing, no further services indicated at this time. Of note, pt stated and described that her speech-language issues have resolved and she is "talking fine now". Pt denied any need for ST services for speech-language issues. NSG to reconsult if any change in status while admitted. MD updated.  SLP Visit Diagnosis: Dysphagia, unspecified (R13.10)    Aspiration Risk   (reduced)    Diet Recommendation  Regular diet w/ thin liquids; general aspiration precautions  Medication Administration: Whole meds with liquid    Other  Recommendations Recommended Consults:  (none) Oral Care Recommendations: Oral care BID;Patient independent with oral care   Follow up Recommendations None      Frequency and Duration  n/a         Prognosis Prognosis for Safe Diet Advancement: Good Barriers to Reach Goals:  (none)      Swallow Study   General Date of Onset: 07/27/17 HPI: Pt is a 61 y.o. female with a known history of HTN, CAD, diabetes mellitus type 2, asthma comes in because of slurred speech. Patient woke up with slurred speech, continued to have slurred speech so she called EMS. EMS found that she was hypoglycemic with sugars in the 50s. They gave dextrose without any improvement in slurred speech. Patient continued to have slurred speech. She told me that she had headache last night. No blurred vision. Complains of right-sided weakness. Patient has history of falls and also uses cane. Currently, pt stated her speech is "fine, I'm talking just fine" - pt is edentulous and does not wear dentures which can impact precise articulation  of speech. Pt is following all commands; engaged in conversation. Pt is requesting food/drink.  Type of Study: Bedside Swallow Evaluation Previous Swallow Assessment: none Diet Prior to this Study: Regular;Thin liquids Temperature Spikes Noted: No (wbc 11.1) Respiratory Status: Room air History of Recent Intubation: No Behavior/Cognition:  Alert;Cooperative;Pleasant mood (follows all instructions appropriately) Oral Cavity Assessment: Within Functional Limits Oral Care Completed by SLP: Recent completion by staff Oral Cavity - Dentition: Edentulous (does not wear dentures) Vision: Functional for self-feeding Self-Feeding Abilities: Able to feed self Patient Positioning: Upright in bed Baseline Vocal Quality: Normal Volitional Cough: Strong Volitional Swallow: Able to elicit    Oral/Motor/Sensory Function Overall Oral Motor/Sensory Function: Within functional limits   Ice Chips Ice chips: Not tested   Thin Liquid Thin Liquid: Within functional limits Presentation: Cup;Self Fed;Straw (~4 ozs total)    Nectar Thick Nectar Thick Liquid: Not tested   Honey Thick Honey Thick Liquid: Not tested   Puree Puree: Within functional limits Presentation: Self Fed;Spoon (3 ozs)   Solid   GO   Solid: Within functional limits Presentation: Self Fed (graham crackers - 4 full crackers)    Functional Assessment Tool Used: clinical judgement Functional Limitations: Swallowing Swallow Current Status (Q2449): At least 1 percent but less than 20 percent impaired, limited or restricted Swallow Goal Status [phone]): At least 1 percent but less than 20 percent impaired, limited or restricted Swallow Discharge Status [phone]): At least 1 percent but less than 20 percent impaired, limited or restricted    Orinda Kenner, MS, CCC-SLP Eugina Row 07/28/2017,10:48 AM

## 2017-07-28 NOTE — Evaluation (Signed)
Physical Therapy Evaluation Patient Details Name: Kristie Olson MRN: [id number] DOB: 1956/09/22 Today's Date: 07/28/2017   History of Present Illness  presented to ER secondary to slurred speech; admitted with hyperglycemia.  TIA/CVA work up negative.  Clinical Impression  Upon evaluation, patient alert and oriented; follows commands.  Eager for upcoming discharge.  Bilat UE/LE strength and ROM grossly symmetrical and WFL; no focal weakness or sensory deficit appreciated (except baseline neuropathy in bilat hands and feet).  Currently requring cga/min assist to complete bed mobility; cga/close sup for sit/stand, basic transfers and gait (120').  Two standing rest breaks required throughout distance due to Roff; do recommend continued use of RW for optimal balance, safety and overall energy conservation.  Patient voiced understanding and awareness.  Higher level balance deficits indicated with poor SLS ability, increased time required to complete 5x sit/stand (34 seconds). Would benefit from skilled PT to address above deficits and promote optimal return to PLOF; Recommend transition to [facility] upon discharge from acute hospitalization.     Follow Up Recommendations Home health PT    Equipment Recommendations   (youth RW)    Recommendations for Other Services       Precautions / Restrictions Precautions Precautions: Fall Restrictions Weight Bearing Restrictions: No      Mobility  Bed Mobility Overal bed mobility: Needs Assistance Bed Mobility: Supine to Sit     Supine to sit: Min assist        Transfers Overall transfer level: Needs assistance Equipment used: Rolling walker (2 wheeled) Transfers: Sit to/from Stand Sit to Stand: Supervision;Min guard            Ambulation/Gait Ambulation/Gait assistance: Supervision;Min guard Ambulation Distance (Feet): 120 Feet Assistive device: Rolling walker (2 wheeled)       General Gait Details: reciprocal stepping  pattern with slow, guarded cadence; two standing rest breaks throughout distances due to fatigue.  Reports this is much longer distance than she typically mobilizes at home  Stairs            Wheelchair Mobility    Modified Rankin (Stroke Patients Only)       Balance Overall balance assessment: Needs assistance Sitting-balance support: No upper extremity supported;Feet supported Sitting balance-Leahy Scale: Normal     Standing balance support: Bilateral upper extremity supported Standing balance-Leahy Scale: Fair   Single Leg Stance - Right Leg: 2 Single Leg Stance - Left Leg: 1                         Pertinent Vitals/Pain Pain Assessment: No/denies pain    Home Living Family/patient expects to be discharged to:: Private residence Living Arrangements: Spouse/significant other;Children Available Help at Discharge: Family Type of Home: House Home Access: Stairs to enter Entrance Stairs-Rails: Right;Left;Can reach both Technical brewer of Steps: 7 Home Layout: One level Home Equipment: Cane - single point      Prior Function Level of Independence: Independent with assistive device(s)         Comments: Mod indep with ADLs (sponge bath) and household mobility, tends to 'furniture cruise'; [facility] for limited community distances.  Does endorse at least 3 falls in previous six months.     Hand Dominance        Extremity/Trunk Assessment   Upper Extremity Assessment Upper Extremity Assessment: Overall WFL for tasks assessed    Lower Extremity Assessment Lower Extremity Assessment: Overall WFL for tasks assessed (grossly 4/5 throughout; baseline paresthesia ankles distally)  Communication   Communication: No difficulties  Cognition Arousal/Alertness: Awake/alert Behavior During Therapy: WFL for tasks assessed/performed Overall Cognitive Status: Within Functional Limits for tasks assessed                                         General Comments      Exercises Other Exercises Other Exercises: 5x sit/stand without assist device, 34 seconds; significantly greater than age-matched norms, indicative of decreaased LE power, increased fall risk with functional activities   Assessment/Plan    PT Assessment Patient needs continued PT services  PT Problem List Decreased activity tolerance;Decreased mobility;Decreased balance;Decreased safety awareness       PT Treatment Interventions DME instruction;Gait training;Stair training;Functional mobility training;Therapeutic activities;Therapeutic exercise;Balance training;Patient/family education    PT Goals (Current goals can be found in the Care Plan section)  Acute Rehab PT Goals Patient Stated Goal: to go home PT Goal Formulation: With patient/family Time For Goal Achievement: 08/11/17 Potential to Achieve Goals: Good    Frequency Min 2X/week   Barriers to discharge        Co-evaluation               AM-PAC PT "6 Clicks" Daily Activity  Outcome Measure Difficulty turning over in bed (including adjusting bedclothes, sheets and blankets)?: Unable Difficulty moving from lying on back to sitting on the side of the bed? : Unable Difficulty sitting down on and standing up from a chair with arms (e.g., wheelchair, bedside commode, etc,.)?: Unable Help needed moving to and from a bed to chair (including a wheelchair)?: A Little Help needed walking in hospital room?: A Little Help needed climbing 3-5 steps with a railing? : A Little 6 Click Score: 12    End of Session Equipment Utilized During Treatment: Gait belt Activity Tolerance: Patient tolerated treatment well Patient left: in bed;with call bell/phone within reach;with bed alarm set;with family/visitor present Nurse Communication: Mobility status PT Visit Diagnosis: Muscle weakness (generalized) (M62.81);Difficulty in walking, not elsewhere classified (R26.2)    Time: 1355-1416 PT Time  Calculation (min) (ACUTE ONLY): 21 min   Charges:   PT Evaluation $PT Eval Low Complexity: 1 Low PT Treatments $Therapeutic Activity: 8-22 mins   PT G Codes:   PT G-Codes **NOT FOR INPATIENT CLASS** Functional Assessment Tool Used: AM-PAC 6 Clicks Basic Mobility Functional Limitation: Mobility: Walking and moving around Mobility: Walking and Moving Around Current Status (R4270): At least 40 percent but less than 60 percent impaired, limited or restricted Mobility: Walking and Moving Around Goal Status [phone]): At least 1 percent but less than 20 percent impaired, limited or restricted    Ellis Mehaffey H. Owens Shark, PT, DPT, NCS 07/28/17, 10:35 PM [phone]

## 2017-07-28 NOTE — Care Management Obs Status (Signed)
[place] NOTIFICATION   Patient Details  Name: OCEANA WALTHALL MRN: [id number] Date of Birth: 1955-12-21   Medicare Observation Status Notification Given:  Yes    Shelbie Ammons, RN 07/28/2017, 9:05 AM

## 2017-07-28 NOTE — Progress Notes (Signed)
PT Cancellation Note  Patient Details Name: FYNLEE ROWLANDS MRN: [id number] DOB: 1956/08/04   Cancelled Treatment:    Reason Eval/Treat Not Completed: Patient at procedure or test/unavailable (Consult received and chart reviewed.  Patient currently off unit for diagnostic imaging.  Will re-attempt at later time/date as medically appropriate and available.)   Brailon Don H. Owens Shark, PT, DPT, NCS 07/28/17, 11:02 AM [phone]

## 2017-07-28 NOTE — Progress Notes (Signed)
Pt for discharge home.no distress. Discharge instructions discussed with pt . presc given and discussed and home meds discussed. Diet / activity  F/u discussed.   Verbalize understanding of all.

## 2017-07-28 NOTE — Progress Notes (Signed)
Inpatient Diabetes Program Recommendations  AACE/ADA: New Consensus Statement on Inpatient Glycemic Control (2015)  Target Ranges:  Prepandial:   less than 140 mg/dL      Peak postprandial:   less than 180 mg/dL (1-2 hours)      Critically ill patients:  140 - 180 mg/dL  Results for Kristie Olson, Kristie Olson (MRN [id number]) as of 07/28/2017 12:52  Ref. Range 07/27/2017 18:29 07/27/2017 22:29 07/28/2017 07:45  Glucose-Capillary Latest Ref Range: 65 - 99 mg/dL 143 (H) 130 (H) 155 (H)   Results for Kristie Olson, Kristie Olson (MRN [id number]) as of 07/28/2017 12:52  Ref. Range 07/28/2017 02:51  Hemoglobin A1C Latest Ref Range: 4.8 - 5.6 % 9.6 (H)   Review of Glycemic Control  Diabetes history: DM2 Outpatient Diabetes medications: Glipizide XL 10 mg QAM Current orders for Inpatient glycemic control: Novolog 0-9 units TID with meals  Inpatient Diabetes Program Recommendations: HgbA1C: A1C 9.6% on 07/28/17 indicating an average glucose of 229 mg/dl over the past 2-3 months. Encouraged patient to monitor glucose at least 2 times per day and follow up at the clinic as directed by discharge instructions.  NOTE: Noted patient admitted with hypoglycemia and slurred speech. Patient also noted to have discharge order. Called room to try to talk with patient before she left. Spoke with patient (over phone) about diabetes and home regimen for diabetes control. Patient reports that she is followed by a PA at [facility] for diabetes management and currently she takes Glipizide 10 mg QAM as an outpatient for diabetes control. Patient reports that she has been taking Glipizide as prescribed and she has been taking the same medication and dose for a long time. Patient is not checking her glucose at home but states that she has everything for glucose monitoring at home. Inquired about events prior to admission. Patient reports that she had eaten early in the day (spagetti and bread) and laid down and went to sleep and when her son woke  her up she could not talk clear and was not feeling right. So EMS was called and her glucose was noted to be low. Inquired about any other hypoglycemic episodes and patient reports that her glucose has gotten low before (has symptoms of sweating and shaky) but usually her glucose is high.  Discussed A1C results (9.6% on 07/28/17) and explained that her current A1C indicates an average glucose of 229 mg/dl over the past 2-3 months. Discussed glucose and A1C goals. Discussed importance of checking CBGs and maintaining good CBG control to prevent long-term and short-term complications.  Discussed impact of nutrition, exercise, stress, sickness, and medications on diabetes control. Patient states that she knows what she should eat but she can not afford to purchase expensive foods which tend to be more expensive. Patient admits that she has recently been drinking sweet tea (for last month or so) but she usually drinks water and diet soda. Recommended she stop drinking sweet tea and that she use unsweetened tea and add artificial sweetener if needed. Discussed carbohydrates, carbohydrate goals per day and meal, along with portion sizes. Encouraged patient to check her glucose at least 2 times per day and to keep a log book of glucose readings which she will need to take to doctor appointments. Explained how the doctor she follows up with can use the log book to continue to make adjustments with DM medications if needed. In further talking with patient she reported that she was having nausea and some vomiting so the clinic ordered an ultrasound  of her abdomen and it was noted that she had a place on her pancreas and she was ordered an MRI of her pancreas. However, when she was called about scheduling the MRI she was told she would have to pay $295 up front the day she came and she told them she could not afford $295 and so the MRI was not scheduled. Explained in basic terms how the pancreas functions and stressed how the  pancreas impacts glycemic control. Stressed to the patient that she needed to have the MRI and encouraged her to check and see if she could make payments perhaps. Patient inquired about having the MRI now as an inpatient. Informed patient I would call Dr. Darvin Neighbours and make sure he is aware of pancreas issue.  Patient verbalized understanding of information discussed and she states that she has no further questions at this time related to diabetes. Talked with Dr. Darvin Neighbours and made him aware of patient's statements regarding pancreas issue and request for MRI as an inpatient.   Thanks, Barnie Alderman, RN, MSN, CDE Diabetes Coordinator Inpatient Diabetes Program [phone] (Team Pager)

## 2017-07-28 NOTE — Discharge Instructions (Signed)
Heart healthy diabetic diet ° °Activity as tolerated °

## 2017-07-28 NOTE — Progress Notes (Signed)
*  PRELIMINARY RESULTS* Echocardiogram 2D Echocardiogram has been performed.  Kristie Olson 07/28/2017, 11:04 AM

## 2017-07-28 NOTE — Consult Note (Signed)
Reason for Consult: slurry speech  Referring Physician: Dr. Darvin Neighbours  CC: Slurry speech   HPI: Kristie Olson is an 61 y.o. female with a known history of CAD, diabetes mellitus type 2, asthma comes in because of slurred speech which was transient and self resolve. Patient woke up with slurred speech, continued to have slurred speech so she called EMS. EMS found that she was hypoglycemic with sugars in the 50s. They gave dextrose without any improvement in slurred speech. Currently back to baseline. No anti platelet use prior to admission.    Past Medical History:  Diagnosis Date  . Asthma   . Coronary artery disease   . Diabetes mellitus without complication ([place])   . Hypertension     Past Surgical History:  Procedure Laterality Date  . CESAREAN SECTION      No family history on file.  Social History:  reports that she has been smoking Cigarettes.  She has been smoking about 0.50 packs per day. She has never used smokeless tobacco. She reports that she does not drink alcohol. Her drug history is not on file.  No Known Allergies  Medications: I have reviewed the patient's current medications.  ROS: History obtained from the patient  General ROS: negative for - chills, fatigue, fever, night sweats, weight gain or weight loss Psychological ROS: negative for - behavioral disorder, hallucinations, memory difficulties, mood swings or suicidal ideation Ophthalmic ROS: negative for - blurry vision, double vision, eye pain or loss of vision ENT ROS: negative for - epistaxis, nasal discharge, oral lesions, sore throat, tinnitus or vertigo Allergy and Immunology ROS: negative for - hives or itchy/watery eyes Hematological and Lymphatic ROS: negative for - bleeding problems, bruising or swollen lymph nodes Endocrine ROS: negative for - galactorrhea, hair pattern changes, polydipsia/polyuria or temperature intolerance Respiratory ROS: negative for - cough, hemoptysis, shortness of breath or  wheezing Cardiovascular ROS: negative for - chest pain, dyspnea on exertion, edema or irregular heartbeat Gastrointestinal ROS: negative for - abdominal pain, diarrhea, hematemesis, nausea/vomiting or stool incontinence Genito-Urinary ROS: negative for - dysuria, hematuria, incontinence or urinary frequency/urgency Musculoskeletal ROS: negative for - joint swelling or muscular weakness Neurological ROS: as noted in HPI Dermatological ROS: negative for rash and skin lesion changes  Physical Examination: Blood pressure (!) 148/55, pulse 88, temperature 98.6 F (37 C), resp. rate 18, height 5' (1.524 m), weight 98 kg (216 lb), SpO2 96 %.    Neurological Examination   Mental Status: Alert, oriented, thought content appropriate.  Speech fluent without evidence of aphasia.  Able to follow 3 step commands without difficulty. Cranial Nerves: II: Discs flat bilaterally; Visual fields grossly normal, pupils equal, round, reactive to light and accommodation III,IV, VI: ptosis not present, extra-ocular motions intact bilaterally V,VII: smile symmetric, facial light touch sensation normal bilaterally VIII: hearing normal bilaterally IX,X: gag reflex present XI: bilateral shoulder shrug XII: midline tongue extension Motor: Right : Upper extremity   5/5    Left:     Upper extremity   5/5  Lower extremity   5/5     Lower extremity   5/5 Tone and bulk:normal tone throughout; no atrophy noted Sensory: Pinprick and light touch intact throughout, bilaterally Deep Tendon Reflexes: 2+ and symmetric throughout Plantars: Right: downgoing   Left: downgoing Cerebellar: normal finger-to-nose, normal rapid alternating movements and normal heel-to-shin test Gait: not tested      Laboratory Studies:   Basic Metabolic Panel:  Recent Labs Lab 07/27/17 1721  NA 141  K  3.8  CL 102  CO2 28  GLUCOSE 122*  BUN 11  CREATININE 0.63  CALCIUM 9.4    Liver Function Tests:  Recent Labs Lab  07/27/17 1721  AST 56*  ALT 37  ALKPHOS 95  BILITOT 0.5  PROT 7.6  ALBUMIN 4.0   No results for input(s): LIPASE, AMYLASE in the last 168 hours. No results for input(s): AMMONIA in the last 168 hours.  CBC:  Recent Labs Lab 07/27/17 1721  WBC 11.1*  NEUTROABS 8.9*  HGB 13.1  HCT 37.9  MCV 91.8  PLT 415    Cardiac Enzymes:  Recent Labs Lab 07/27/17 1721  TROPONINI <0.03    BNP: Invalid input(s): POCBNP  CBG:  Recent Labs Lab 07/27/17 1829 07/27/17 2229 07/28/17 0745  GLUCAP 143* 130* 155*    Microbiology: No results found for this or any previous visit.  Coagulation Studies:  Recent Labs  07/27/17 1721  LABPROT 12.1  INR 0.90    Urinalysis: No results for input(s): COLORURINE, LABSPEC, PHURINE, GLUCOSEU, HGBUR, BILIRUBINUR, KETONESUR, PROTEINUR, UROBILINOGEN, NITRITE, LEUKOCYTESUR in the last 168 hours.  Invalid input(s): APPERANCEUR  Lipid Panel:     Component Value Date/Time   CHOL 138 07/28/2017 0251   TRIG 133 07/28/2017 0251   HDL 39 (L) 07/28/2017 0251   CHOLHDL 3.5 07/28/2017 0251   VLDL 27 07/28/2017 0251   LDLCALC 72 07/28/2017 0251    HgbA1C:  Lab Results  Component Value Date   HGBA1C 9.6 (H) 07/28/2017    Urine Drug Screen:  No results found for: LABOPIA, COCAINSCRNUR, LABBENZ, AMPHETMU, THCU, LABBARB  Alcohol Level: No results for input(s): ETH in the last 168 hours.  Other results: EKG: normal EKG, normal sinus rhythm, unchanged from previous tracings.  Imaging: Ct Head Wo Contrast  Result Date: 07/27/2017 CLINICAL DATA:  Slurred speech. EXAM: CT HEAD WITHOUT CONTRAST TECHNIQUE: Contiguous axial images were obtained from the base of the skull through the vertex without intravenous contrast. COMPARISON:  09/14/2010 FINDINGS: Brain: No acute intracranial abnormality. Specifically, no hemorrhage, hydrocephalus, mass lesion, acute infarction, or significant intracranial injury. Vascular: No hyperdense vessel or  unexpected calcification. Skull: No acute calvarial abnormality. Sinuses/Orbits: Visualized paranasal sinuses and mastoids clear. Orbital soft tissues unremarkable. Other: None IMPRESSION: No acute intracranial abnormality. Electronically Signed   By: Rolm Baptise M.D.   On: 07/27/2017 17:41   Mr Brain Wo Contrast  Result Date: 07/28/2017 CLINICAL DATA:  Slurred speech, altered level of  consciousness. EXAM: MRI HEAD WITHOUT CONTRAST MRA HEAD WITHOUT CONTRAST TECHNIQUE: Multiplanar, multiecho pulse sequences of the brain and surrounding structures were obtained without intravenous contrast. Angiographic images of the head were obtained using MRA technique without contrast. COMPARISON:  CT head 07/27/2017 FINDINGS: MRI HEAD FINDINGS Brain: Negative for acute infarct. Ventricle size normal. No significant chronic ischemia. Negative for hemorrhage or mass. Cerebral volume normal. Vascular: Normal arterial flow voids Skull and upper cervical spine: Negative Sinuses/Orbits: Mild mucosal edema paranasal sinuses. Bilateral lens replacement Other: None MRA HEAD FINDINGS Moderate stenosis left cavernous carotid and mild stenosis right cavernous carotid. Anterior and middle cerebral artery patent without significant stenosis or occlusion Both vertebral arteries patent to the basilar. PICA not included on the scan due to patient positioning. Basilar widely patent. AICA, superior cerebellar, and posterior cerebral artery patent. Fetal origin right posterior cerebral artery. Negative for cerebral aneurysm. IMPRESSION: Negative MRI head Negative MRA head Electronically Signed   By: Franchot Gallo M.D.   On: 07/28/2017 07:20   Mr Jodene Nam  Head/brain Wo Cm  Result Date: 07/28/2017 CLINICAL DATA:  Slurred speech, altered level of  consciousness. EXAM: MRI HEAD WITHOUT CONTRAST MRA HEAD WITHOUT CONTRAST TECHNIQUE: Multiplanar, multiecho pulse sequences of the brain and surrounding structures were obtained without intravenous  contrast. Angiographic images of the head were obtained using MRA technique without contrast. COMPARISON:  CT head 07/27/2017 FINDINGS: MRI HEAD FINDINGS Brain: Negative for acute infarct. Ventricle size normal. No significant chronic ischemia. Negative for hemorrhage or mass. Cerebral volume normal. Vascular: Normal arterial flow voids Skull and upper cervical spine: Negative Sinuses/Orbits: Mild mucosal edema paranasal sinuses. Bilateral lens replacement Other: None MRA HEAD FINDINGS Moderate stenosis left cavernous carotid and mild stenosis right cavernous carotid. Anterior and middle cerebral artery patent without significant stenosis or occlusion Both vertebral arteries patent to the basilar. PICA not included on the scan due to patient positioning. Basilar widely patent. AICA, superior cerebellar, and posterior cerebral artery patent. Fetal origin right posterior cerebral artery. Negative for cerebral aneurysm. IMPRESSION: Negative MRI head Negative MRA head Electronically Signed   By: Franchot Gallo M.D.   On: 07/28/2017 07:20     Assessment/Plan:  61 y.o. female with a known history of CAD, diabetes mellitus type 2, asthma comes in because of slurred speech which was transient and self resolve. Patient woke up with slurred speech, continued to have slurred speech so she called EMS. EMS found that she was hypoglycemic with sugars in the 50s. They gave dextrose without any improvement in slurred speech. Currently back to baseline. No anti platelet use prior to admission.    At baseline - Imaging no acute abnormality - awaiting echo results - ASA daily/statin - pt/ot and d/c planning today 07/28/2017, 12:02 PM

## 2017-07-29 LAB — HIV ANTIBODY (ROUTINE TESTING W REFLEX): HIV Screen 4th Generation wRfx: NONREACTIVE

## 2017-07-29 LAB — GLUCOSE, CAPILLARY: Glucose-Capillary: 268 mg/dL — ABNORMAL HIGH (ref 65–99)

## 2017-08-01 NOTE — Discharge Summary (Signed)
[facility] [place] NAME: Kristie Olson    MR#:  [id number]  DATE OF BIRTH:  [birth year]  DATE OF ADMISSION:  07/27/2017 ADMITTING PHYSICIAN: Epifanio Lesches, MD  DATE OF DISCHARGE: 07/28/2017  4:14 PM  PRIMARY CARE PHYSICIAN: [facility] [place]   ADMISSION DIAGNOSIS:  Dysphasia [R47.02] Transient cerebral ischemia, unspecified type [G45.9]  DISCHARGE DIAGNOSIS:  Active Problems:   Slurred speech   SECONDARY DIAGNOSIS:   Past Medical History:  Diagnosis Date  . Asthma   . Coronary artery disease   . Diabetes mellitus without complication ([place])   . Hypertension      ADMITTING HISTORY  HISTORY OF PRESENT ILLNESS:  Kristie Olson  is a 61 y.o. female with a known history ofHistory of CAD, diabetes mellitus type 2, asthma comes in because of slurred speech. Patient woke up with slurred speech, continued to have slurred speech so she called EMS. EMS found that she was hypoglycemic with sugars in the 50s. They gave dextrose without any improvement in slurred speech. Patient continued to have slurred speech. She told me that she had headache last night. No blurred vision. Complains of right-sided weakness. Patient has history of falls and also uses cane.   HOSPITAL COURSE:   * TIA MRI showed nothing acute. Echocardiogram and carotid Dopplers showed no significant stenosis or source of embolus. Patient started on aspirin and statin. PT and OT and speech saw the patient. Home health PT set up at discharge.  Other comorbidities remained stable.  Discharged home with home health physical therapy.  CONSULTS OBTAINED:  Treatment Team:  [name], MD  DRUG ALLERGIES:  No Known Allergies  DISCHARGE MEDICATIONS:   Discharge Medication List as of 07/28/2017  2:00 PM    START taking these medications   Details  aspirin EC 81 MG tablet Take 1 tablet (81 mg total) by mouth daily., Starting Thu 07/28/2017, No Print     atorvastatin (LIPITOR) 10 MG tablet Take 1 tablet (10 mg total) by mouth daily at 6 PM., Starting Thu 07/28/2017, Until Fri 07/28/2018, Print      CONTINUE these medications which have NOT CHANGED   Details  amitriptyline (ELAVIL) 100 MG tablet Take 50 mg by mouth at bedtime as needed for sleep., Historical Med    FLUoxetine (PROZAC) 40 MG capsule Take 40 mg by mouth daily., Historical Med    gabapentin (NEURONTIN) 100 MG capsule Take 300 mg by mouth at bedtime., Historical Med    glipiZIDE (GLUCOTROL) 10 MG tablet Take 10 mg by mouth daily before breakfast., Historical Med    lisinopril-hydrochlorothiazide (PRINZIDE,ZESTORETIC) 20-25 MG tablet Take 1 tablet by mouth daily., Historical Med        Today   VITAL SIGNS:  Blood pressure 139/61, pulse 84, temperature 98.6 F (37 C), resp. rate 18, height 5' (1.524 m), weight 98 kg (216 lb), SpO2 97 %.  I/O:  No intake or output data in the 24 hours ending 08/01/17 1425  PHYSICAL EXAMINATION:  Physical Exam  GENERAL:  61 y.o.-year-old patient lying in the bed with no acute distress.  LUNGS: Normal breath sounds bilaterally, no wheezing, rales,rhonchi or crepitation. No use of accessory muscles of respiration.  CARDIOVASCULAR: S1, S2 normal. No murmurs, rubs, or gallops.  ABDOMEN: Soft, non-tender, non-distended. Bowel sounds present. No organomegaly or mass.  NEUROLOGIC: Moves all 4 extremities. PSYCHIATRIC: The patient is alert and oriented x 3.  SKIN: No obvious rash, lesion, or ulcer.  DATA REVIEW:   CBC  Recent Labs Lab 07/27/17 1721  WBC 11.1*  HGB 13.1  HCT 37.9  PLT 415    Chemistries   Recent Labs Lab 07/27/17 1721  NA 141  K 3.8  CL 102  CO2 28  GLUCOSE 122*  BUN 11  CREATININE 0.63  CALCIUM 9.4  AST 56*  ALT 37  ALKPHOS 95  BILITOT 0.5    Cardiac Enzymes  Recent Labs Lab 07/27/17 1721  TROPONINI <0.03    Microbiology Results  No results found for this or any previous  visit.  RADIOLOGY:  No results found.  Follow up with PCP in 1 week.  Management plans discussed with the patient, family and they are in agreement.  CODE STATUS:  Code Status History    Date Active Date Inactive Code Status Order ID Comments User Context   07/27/2017  7:47 PM 07/28/2017  7:20 PM Full Code [id number]  Epifanio Lesches, MD ED      TOTAL TIME TAKING CARE OF THIS PATIENT ON DAY OF DISCHARGE: more than 30 minutes.   Hillary Bow R M.D on 08/01/2017 at 2:25 PM  Between 7am to 6pm - Pager - [phone]  After 6pm go to [email or web address] - password [email or web address] [facility]  Office  [phone]  CC: Primary care physician; [facility]  Note: This dictation was prepared with Dragon dictation along with smaller phrase technology. Any transcriptional errors that result from this process are unintentional.

## 2017-08-05 DIAGNOSIS — G459 Transient cerebral ischemic attack, unspecified: Secondary | ICD-10-CM | POA: Insufficient documentation

## 2018-02-07 ENCOUNTER — Emergency Department: Payer: Medicare HMO

## 2018-02-07 ENCOUNTER — Other Ambulatory Visit: Payer: Self-pay

## 2018-02-07 ENCOUNTER — Emergency Department
Admission: EM | Admit: 2018-02-07 | Discharge: 2018-02-07 | Disposition: A | Discharge status: Home / Self Care | Payer: Medicare HMO | Attending: Emergency Medicine | Admitting: Emergency Medicine

## 2018-02-07 DIAGNOSIS — F17210 Nicotine dependence, cigarettes, uncomplicated: Secondary | ICD-10-CM | POA: Insufficient documentation

## 2018-02-07 DIAGNOSIS — I10 Essential (primary) hypertension: Secondary | ICD-10-CM | POA: Insufficient documentation

## 2018-02-07 DIAGNOSIS — Z7984 Long term (current) use of oral hypoglycemic drugs: Secondary | ICD-10-CM | POA: Insufficient documentation

## 2018-02-07 DIAGNOSIS — R079 Chest pain, unspecified: Secondary | ICD-10-CM | POA: Insufficient documentation

## 2018-02-07 DIAGNOSIS — Z7982 Long term (current) use of aspirin: Secondary | ICD-10-CM | POA: Diagnosis not present

## 2018-02-07 DIAGNOSIS — I2510 Atherosclerotic heart disease of native coronary artery without angina pectoris: Secondary | ICD-10-CM | POA: Insufficient documentation

## 2018-02-07 DIAGNOSIS — Z79899 Other long term (current) drug therapy: Secondary | ICD-10-CM | POA: Insufficient documentation

## 2018-02-07 DIAGNOSIS — J45909 Unspecified asthma, uncomplicated: Secondary | ICD-10-CM | POA: Insufficient documentation

## 2018-02-07 DIAGNOSIS — E119 Type 2 diabetes mellitus without complications: Secondary | ICD-10-CM | POA: Insufficient documentation

## 2018-02-07 LAB — CBC WITH DIFFERENTIAL/PLATELET
Basophils Absolute: 0 10*3/uL (ref 0–0.1)
Basophils Relative: 0 %
Eosinophils Absolute: 0 10*3/uL (ref 0–0.7)
Eosinophils Relative: 1 %
HCT: 43.4 % (ref 35.0–47.0)
Hemoglobin: 14.4 g/dL (ref 12.0–16.0)
Lymphocytes Relative: 44 %
Lymphs Abs: 3 10*3/uL (ref 1.0–3.6)
MCH: 31 pg (ref 26.0–34.0)
MCHC: 33.2 g/dL (ref 32.0–36.0)
MCV: 93.2 fL (ref 80.0–100.0)
Monocytes Absolute: 0.5 10*3/uL (ref 0.2–0.9)
Monocytes Relative: 7 %
Neutro Abs: 3.3 10*3/uL (ref 1.4–6.5)
Neutrophils Relative %: 48 %
Platelets: 461 10*3/uL — ABNORMAL HIGH (ref 150–440)
RBC: 4.65 MIL/uL (ref 3.80–5.20)
RDW: 13.6 % (ref 11.5–14.5)
WBC: 6.9 10*3/uL (ref 3.6–11.0)

## 2018-02-07 LAB — COMPREHENSIVE METABOLIC PANEL
ALT: 24 U/L (ref 14–54)
AST: 30 U/L (ref 15–41)
Albumin: 4.1 g/dL (ref 3.5–5.0)
Alkaline Phosphatase: 105 U/L (ref 38–126)
Anion gap: 12 (ref 5–15)
BUN: 9 mg/dL (ref 6–20)
CO2: 24 mmol/L (ref 22–32)
Calcium: 9.4 mg/dL (ref 8.9–10.3)
Chloride: 96 mmol/L — ABNORMAL LOW (ref 101–111)
Creatinine, Ser: 0.83 mg/dL (ref 0.44–1.00)
GFR calc Af Amer: >60 mL/min (ref >60)
GFR calc non Af Amer: >60 mL/min (ref >60)
Glucose, Bld: 364 mg/dL — ABNORMAL HIGH (ref 65–99)
Potassium: 4.8 mmol/L (ref 3.5–5.1)
Sodium: 132 mmol/L — ABNORMAL LOW (ref 135–145)
Total Bilirubin: 0.9 mg/dL (ref 0.3–1.2)
Total Protein: 8.2 g/dL — ABNORMAL HIGH (ref 6.5–8.1)

## 2018-02-07 LAB — TROPONIN I
Troponin I: <0.03 ng/mL (ref <0.03)
Troponin I: <0.03 ng/mL (ref <0.03)

## 2018-02-07 LAB — LIPASE, BLOOD: Lipase: 18 U/L (ref 11–51)

## 2018-02-07 NOTE — ED Provider Notes (Signed)
[facility] Emergency Department Provider Note  ____________________________________________   First MD Initiated Contact with Patient 02/07/18 1724     (approximate)  I have reviewed the triage vital signs and the nursing notes.   HISTORY is Chief Complaint Chest Pain    HPI Kristie Olson is a 62 y.o. female with medical history as listed below who presents for evaluation of acute onset chest pain while she was walking in her house.  She states she has never felt anything like this in the past.  It was present substernally in the top of her chest and radiated a little bit up into her neck.  She says that it lasted for "a while" and she asked her grandson to call EMS, but as soon as she got loaded onto the ambulance the chest pain stopped.  She still feels a little bit of discomfort in her throat, like something is stuck.  She denies any burning sensation, like acid reflux, but states it felt more of a sharp and pressure-like sensation.  Nothing in particular made it better or worse.  She denies shortness of breath, sweating, fever/chills, nausea, vomiting, and abdominal pain.  She still feels a little bit of discomfort in her neck but those symptoms are mild, the chest pain was severe and acute in onset.  She has a history of hypertension, diabetes, hyperlipidemia, and she is a current smoker.  She denies ever having a heart attack and does not have a cardiologist.  She takes a daily aspirin.  She has no first-degree relatives who have had a heart attack.  Past Medical History:  Diagnosis Date  . Asthma   . Coronary artery disease   . Diabetes mellitus without complication ([place])   . Hypertension     Patient Active Problem List   Diagnosis Date Noted  . Slurred speech 07/27/2017    Past Surgical History:  Procedure Laterality Date  . CESAREAN SECTION      Prior to Admission medications   Medication Sig Start Date End Date Taking? Authorizing  Provider  amitriptyline (ELAVIL) 100 MG tablet Take 50 mg by mouth at bedtime as needed for sleep.    [provider]  aspirin EC 81 MG tablet Take 1 tablet (81 mg total) by mouth daily. 07/28/17   Hillary Bow, MD  atorvastatin (LIPITOR) 10 MG tablet Take 1 tablet (10 mg total) by mouth daily at 6 PM. 07/28/17 07/28/18  Hillary Bow, MD  FLUoxetine (PROZAC) 40 MG capsule Take 40 mg by mouth daily.    [provider]  gabapentin (NEURONTIN) 100 MG capsule Take 300 mg by mouth at bedtime.    [provider]  glipiZIDE (GLUCOTROL) 10 MG tablet Take 10 mg by mouth daily before breakfast.    [provider]  lisinopril-hydrochlorothiazide (PRINZIDE,ZESTORETIC) 20-25 MG tablet Take 1 tablet by mouth daily.    [provider]    Allergies Patient has no known allergies.  No family history on file.  Social History Social History   Tobacco Use  . Smoking status: Current Every Day Smoker    Packs/day: 0.50    Types: Cigarettes  . Smokeless tobacco: Never Used  Substance Use Topics  . Alcohol use: No  . Drug use: Not on file    Review of Systems Constitutional: No fever/chills Eyes: No visual changes. ENT: No sore throat. Cardiovascular: Chest pain as described above Respiratory: Denies shortness of breath. Gastrointestinal: No abdominal pain.  No nausea, no vomiting.  No diarrhea.  No constipation. Genitourinary: Negative for dysuria. Musculoskeletal: Negative for neck pain.  Negative for back pain. Integumentary: Negative for rash. Neurological: Negative for headaches, focal weakness or numbness.   ____________________________________________   PHYSICAL EXAM:  VITAL SIGNS: ED Triage Vitals  Enc Vitals Group     BP 02/07/18 1713 (!) 143/82     Pulse Rate 02/07/18 1713 84     Resp 02/07/18 1713 18     Temp 02/07/18 1713 98.6 F (37 C)     Temp Source 02/07/18 1713 Oral     SpO2 02/07/18 1713 99 %     Weight 02/07/18 1715 92.5  kg (204 lb)     Height 02/07/18 1715 1.549 m (5\' 1" )     Head Circumference --      Peak Flow --      Pain Score 02/07/18 1715 1     Pain Loc --      Pain Edu? --      Excl. in [place]? --     Constitutional: Alert and oriented. Well appearing and in no acute distress. Eyes: Conjunctivae are normal.  Head: Atraumatic. Nose: No congestion/rhinnorhea. Mouth/Throat: Mucous membranes are moist. Neck: No stridor.  No meningeal signs.   Cardiovascular: Normal rate, regular rhythm. Good peripheral circulation. Grossly normal heart sounds.  Some reproducible chest wall tenderness Respiratory: Normal respiratory effort.  No retractions. Lungs CTAB. Gastrointestinal: Soft with mild tenderness to the epigastrium, otherwise no abdominal tenderness, no rebound, no guarding Musculoskeletal: No lower extremity tenderness nor edema. No gross deformities of extremities. Neurologic:  Normal speech and language. No gross focal neurologic deficits are appreciated.  Skin:  Skin is warm, dry and intact. No rash noted. Psychiatric: Mood and affect are normal. Speech and behavior are normal.  ____________________________________________   LABS (all labs ordered are listed, but only abnormal results are displayed)  Labs Reviewed  CBC WITH DIFFERENTIAL/PLATELET - Abnormal; Notable for the following components:      Result Value   Platelets 461 (*)    All other components within normal limits  COMPREHENSIVE METABOLIC PANEL - Abnormal; Notable for the following components:   Sodium 132 (*)    Chloride 96 (*)    Glucose, Bld 364 (*)    Total Protein 8.2 (*)    All other components within normal limits  LIPASE, BLOOD  TROPONIN I  TROPONIN I   ____________________________________________  EKG  ED ECG REPORT I, Hinda Kehr, the attending physician, personally viewed and interpreted this ECG.  Date: 02/07/2018 EKG Time: 17: 16 Rate: 84 Rhythm: Sinus rhythm QRS Axis: normal Intervals: Incomplete  right bundle branch block and left anterior fascicular block ST/T Wave abnormalities: normal Narrative Interpretation: no evidence of acute ischemia and unchanged from prior EKG  ____________________________________________  RADIOLOGY I, Hinda Kehr, personally viewed and evaluated these images (plain radiographs) as part of my medical decision making, as well as reviewing the written report by the radiologist.  ED MD interpretation: 7 mm nodular density over the left hemidiaphragm of indeterminate origin.  Radiology recommends nonemergent outpatient follow-up  Official radiology report(s): Dg Chest 2 View  Result Date: 02/07/2018 CLINICAL DATA:  Initial evaluation for acute chest pain. EXAM: CHEST - 2 VIEW COMPARISON:  Prior radiograph from 03/01/2017. FINDINGS: The cardiac and mediastinal silhouettes are stable in size and contour, and remain within normal limits. The lungs are normally inflated. No airspace consolidation, pleural effusion, or pulmonary edema is identified. There is no pneumothorax. 7 mm nodular density overlying  the left hemidiaphragm noted, indeterminate. No acute osseus abnormality. Degenerative changes noted about the shoulders bilaterally. IMPRESSION: 1. No active cardiopulmonary disease. 2. 7 mm nodular density overlying the left hemidiaphragm, indeterminate. Follow-up examination with dedicated cross-sectional imaging of the chest suggested for complete evaluation. This could be performed on a nonemergent basis. Electronically Signed   By: Jeannine Boga M.D.   On: 02/07/2018 18:27    ____________________________________________   PROCEDURES  Critical Care performed: No   Procedure(s) performed:   Procedures   ____________________________________________   INITIAL IMPRESSION / ASSESSMENT AND PLAN / ED COURSE  As part of my medical decision making, I reviewed the following data within the [place] notes reviewed and  incorporated, Labs reviewed , EKG interpreted , Old EKG reviewed, Old chart reviewed and Radiograph reviewed     Differential diagnosis includes, but is not limited to, ACS, aortic dissection, pulmonary embolism, cardiac tamponade, pneumothorax, pneumonia, pericarditis, myocarditis, GI-related causes including esophagitis/gastritis, and musculoskeletal chest wall pain.    I believe that most likely the cause of the patient's chest pain is GI related because she is tender to palpation of the epigastrium and feels a little bit of discomfort in her throat.  She is in no distress at this time and her abdominal exam is benign.  Her first troponin is negative, CBC unremarkable, lipase normal, and metabolic panel is essentially normal with a sodium at 132, glucose 364, and vital signs are all stable.  Given that the patient is asymptomatic and has an unchanged EKG from prior I will check a second troponin and if her results are within normal limits and she is still pain-free I will encourage close outpatient follow-up with cardiology or her primary care doctor.  She understands and agrees with the plan.  Clinical Course as of Feb 08 2139  Tue Feb 07, 2018  2138 Patient is asymptomatic and has been asymptomatic since coming to the emergency department.  I just checked on her and she ate a full meal without any difficulties.  She says she is ready to go home.  I told her that it is extremely important she follow up with a cardiologist I am providing the contact information, and she says that she will do so.  Her husband is now present and I went over everything with him and he agrees with the plan as well.  She says that she took a full dose aspirin today so I will not give her an additional aspirin.  I gave my usual and customary return precautions.    [CF]    Clinical Course User Index [CF] Hinda Kehr, MD    ____________________________________________  FINAL CLINICAL IMPRESSION(S) / ED  DIAGNOSES  Final diagnoses:  Chest pain, unspecified type     MEDICATIONS GIVEN DURING THIS VISIT:  Medications - No data to display   ED Discharge Orders    None       Note:  This document was prepared using Dragon voice recognition software and may include unintentional dictation errors.    Hinda Kehr, MD 02/07/18 2140

## 2018-02-07 NOTE — ED Notes (Signed)

## 2018-02-07 NOTE — Discharge Instructions (Signed)

## 2018-02-07 NOTE — ED Triage Notes (Signed)
To ER via [facility] from home c/o chest pain to central part of chest that radiates up into throat at approx 4PM today. States when EMS arrived, chest pain gone but discomfort in neck continues. States pain radiated to left arm when event occurred. Pt talking on phone when CP occurred. Pt alert and oriented X4, active, cooperative, pt in NAD. RR even and unlabored, color WNL.

## 2018-03-01 ENCOUNTER — Encounter: Payer: Self-pay | Admitting: *Deleted

## 2018-03-28 ENCOUNTER — Encounter: Payer: Self-pay | Admitting: General Surgery

## 2018-03-28 ENCOUNTER — Ambulatory Visit (INDEPENDENT_AMBULATORY_CARE_PROVIDER_SITE_OTHER): Payer: Medicare HMO

## 2018-03-28 ENCOUNTER — Ambulatory Visit: Payer: Medicare HMO | Admitting: General Surgery

## 2018-03-28 VITALS — BP 112/64 | HR 99 | Temp 97.5°F | Resp 14 | Ht 61.0 in | Wt 202.0 lb

## 2018-03-28 DIAGNOSIS — N6312 Unspecified lump in the right breast, upper inner quadrant: Secondary | ICD-10-CM | POA: Diagnosis not present

## 2018-03-28 DIAGNOSIS — N611 Abscess of the breast and nipple: Secondary | ICD-10-CM

## 2018-03-28 NOTE — Progress Notes (Signed)
Patient ID: Kristie Olson, female   DOB: Mar 23, 1956, 62 y.o.   MRN: [id number]  Chief Complaint  Patient presents with  . Other    HPI Palyn Scrima Pelphrey is a 62 y.o. female here today for a evaluation pf a right breast abscess referred by Elisabeth Cara NP. Patient states this started about a 3-4 weeks ago. She states her right breast became red and swollen, no drainage. She states there was 2 little knots and one large knot. She completed the antibiotics about 2 weeks ago, she states they did help some. She states her sister had breast cancer, so she was worried.  She is recovering from a head cold and congestion that she has had for 2 weeks. Denies any breast injury or trauma. She states for 2 days she has been "weak as water". She states her last mammogram June 2018.  HPI  Past Medical History:  Diagnosis Date  . Asthma   . Coronary artery disease   . Diabetes mellitus without complication ([place]) 5732  . Hypertension     Past Surgical History:  Procedure Laterality Date  . BREAST MASS EXCISION Left    age 35"s, benign  . CESAREAN SECTION    . CHOLECYSTECTOMY  2009    Family History  Problem Relation Age of Onset  . Breast cancer Sister 33  . Breast cancer Paternal Aunt     Social History Social History   Tobacco Use  . Smoking status: Current Every Day Smoker    Packs/day: 0.50    Types: Cigarettes  . Smokeless tobacco: Never Used  Substance Use Topics  . Alcohol use: No  . Drug use: Not on file    No Known Allergies  Current Outpatient Medications  Medication Sig Dispense Refill  . amitriptyline (ELAVIL) 100 MG tablet Take 50 mg by mouth at bedtime as needed for sleep.    Marland Kitchen aspirin EC 81 MG tablet Take 1 tablet (81 mg total) by mouth daily.    Marland Kitchen atorvastatin (LIPITOR) 10 MG tablet Take 1 tablet (10 mg total) by mouth daily at 6 PM. 30 tablet 0  . FLUoxetine (PROZAC) 40 MG capsule Take 40 mg by mouth daily.    Marland Kitchen gabapentin (NEURONTIN) 100 MG capsule Take 300  mg by mouth at bedtime.    Marland Kitchen glipiZIDE (GLUCOTROL) 10 MG tablet Take 10 mg by mouth daily before breakfast.    . lisinopril-hydrochlorothiazide (PRINZIDE,ZESTORETIC) 20-25 MG tablet Take 1 tablet by mouth daily.     No current facility-administered medications for this visit.     Review of Systems Review of Systems  Constitutional: Negative.   Respiratory: Positive for cough.   Cardiovascular: Negative.   Neurological: Positive for weakness.    Blood pressure 112/64, pulse 99, temperature (!) 97.5 F (36.4 C), temperature source Oral, resp. rate 14, height 5\' 1"  (1.549 m), weight 202 lb (91.6 kg), SpO2 95 %.  Physical Exam Physical Exam  Constitutional: She is oriented to person, place, and time. She appears well-developed and well-nourished.  HENT:  Mouth/Throat: Oropharynx is clear and moist.  Eyes: Conjunctivae are normal. No scleral icterus.  Neck: Neck supple.  Cardiovascular: Normal rate, regular rhythm and normal heart sounds.  Pulmonary/Chest: Effort normal. She has wheezes. Right breast exhibits mass. Right breast exhibits no inverted nipple, no nipple discharge, no skin change and no tenderness. Left breast exhibits no inverted nipple, no mass, no nipple discharge, no skin change and no tenderness.  Right breast 10 oclock 3 CFN  a 1.5 cm nodule, 1.5 cm nodule at 1 oclock 9 CFN and at 2 oclock 2 CFN a < 1cm nodule    Lymphadenopathy:    She has no cervical adenopathy.    She has no axillary adenopathy.  Neurological: She is alert and oriented to person, place, and time.  Skin: Skin is warm and dry.  Psychiatric: Her behavior is normal.    Data Reviewed Ultrasound examination of the right breast was completed to assess the multiple palpable areas.  In the 1 o'clock position, 9 cm from the nipple a 0.8 x 1.1 x 2.5 cm irregular hypoechoic mass with areas of isoechoic tissue without increased vascular flow is noted in the area abutting the dermis and extending down to the  edge of the breast parenchyma.  This is consistent with a resolving abscess.  At the 10 o'clock position, 3 cm from the nipple a hypoechoic mass with posterior acoustic enhancement measuring 0.7 x 1.37 x 1.39 with suggestive of debris and fluid is noted, consistent with a resolving abscess.  At the 2 o'clock position, 2 cm from nipple a ill-defined hypoechoic area measuring 0.5 x 0.68 x 0.83 cm is noted adjacent to multiple prominent ducts.  Likely resolving abscess versus duct inflammation.  BI-RADS-3.  Assessment    Likely multiple breast abscesses in a poorly controlled long-standing diabetic without a predisposing event.      Plan     Would withhold additional antibiotic therapy at this time.  The patient has been encouraged to call promptly if she notices recurrent redness or tenderness in the breast.    The patient is aware to use a heating pad as needed for comfort. Follow up in one month     HPI, Physical Exam, Assessment and Plan have been scribed under the direction and in the presence of Robert Bellow, MD. Karie Fetch, RN  I have completed the exam and reviewed the above documentation for accuracy and completeness.  I agree with the above.  Haematologist has been used and any errors in dictation or transcription are unintentional.  Hervey Ard, M.D., F.A.C.S.  Forest Gleason Genavive Kubicki 03/29/2018, 9:13 PM

## 2018-03-28 NOTE — Patient Instructions (Addendum)
The patient is aware to call back for any questions or concerns. The patient is aware to use a heating pad as needed for comfort.  

## 2018-03-29 ENCOUNTER — Telehealth: Payer: Self-pay | Admitting: General Surgery

## 2018-03-29 DIAGNOSIS — N611 Abscess of the breast and nipple: Secondary | ICD-10-CM | POA: Insufficient documentation

## 2018-03-29 NOTE — Telephone Encounter (Signed)
I CALLED & L/M FOR PATIENT TO CALL BACK AND CLARIFY INFORMATION ON HER PATIENT DEMOGRAPHIC FORM & DPR .

## 2018-04-25 ENCOUNTER — Ambulatory Visit (INDEPENDENT_AMBULATORY_CARE_PROVIDER_SITE_OTHER): Payer: Medicare HMO | Admitting: General Surgery

## 2018-04-25 ENCOUNTER — Encounter: Payer: Self-pay | Admitting: General Surgery

## 2018-04-25 VITALS — BP 146/76 | HR 95 | Resp 14 | Ht 61.0 in | Wt 209.0 lb

## 2018-04-25 DIAGNOSIS — N611 Abscess of the breast and nipple: Secondary | ICD-10-CM | POA: Diagnosis not present

## 2018-04-25 NOTE — Progress Notes (Signed)
Patient ID: Kristie Olson, female   DOB: 1955-12-25, 62 y.o.   MRN: [id number]  Chief Complaint  Patient presents with  . Follow-up    HPI Kristie Olson is a 62 y.o. female.  Here for follow up right breast abscess. She states she is doing well. The area is less swollen and the redness has improved. Admits to occasional tenderness with pressure. She states she can feel a left abdominal knot. She is here with her granddaughter, Kristie Olson.  HPI  Past Medical History:  Diagnosis Date  . Asthma   . Coronary artery disease   . Diabetes mellitus without complication (Kristie Olson) 3474  . Hypertension     Past Surgical History:  Procedure Laterality Date  . BREAST MASS EXCISION Left    age 49"s, benign  . CESAREAN SECTION    . CHOLECYSTECTOMY  2009    Family History  Problem Relation Age of Onset  . Breast cancer Sister 44  . Breast cancer Paternal Aunt     Social History Social History   Tobacco Use  . Smoking status: Current Every Day Smoker    Packs/day: 0.50    Types: Cigarettes  . Smokeless tobacco: Never Used  Substance Use Topics  . Alcohol use: No  . Drug use: Not on file    No Known Allergies  Current Outpatient Medications  Medication Sig Dispense Refill  . amitriptyline (ELAVIL) 100 MG tablet Take 50 mg by mouth at bedtime as needed for sleep.    Marland Kitchen aspirin EC 81 MG tablet Take 1 tablet (81 mg total) by mouth daily.    Marland Kitchen atorvastatin (LIPITOR) 10 MG tablet Take 1 tablet (10 mg total) by mouth daily at 6 PM. 30 tablet 0  . FLUoxetine (PROZAC) 40 MG capsule Take 40 mg by mouth daily.    Marland Kitchen gabapentin (NEURONTIN) 100 MG capsule Take 300 mg by mouth at bedtime.    Marland Kitchen glipiZIDE (GLUCOTROL) 10 MG tablet Take 10 mg by mouth daily before breakfast.    . lisinopril-hydrochlorothiazide (PRINZIDE,ZESTORETIC) 20-25 MG tablet Take 1 tablet by mouth daily.     No current facility-administered medications for this visit.     Review of Systems Review of Systems   Constitutional: Negative.   Respiratory: Negative.   Cardiovascular: Negative.     Blood pressure (!) 146/76, pulse 95, resp. rate 14, height 5\' 1"  (1.549 m), weight 209 lb (94.8 kg), SpO2 99 %.  Physical Exam Physical Exam  Constitutional: She is oriented to person, place, and time. She appears well-developed and well-nourished.  HENT:  Mouth/Throat: Oropharynx is clear and moist.  Eyes: Conjunctivae are normal. No scleral icterus.  Neck: Neck supple.  Cardiovascular: Normal rate, regular rhythm and normal heart sounds.  Pulmonary/Chest: Effort normal and breath sounds normal. Right breast exhibits no inverted nipple, no mass, no nipple discharge, no skin change and no tenderness. Left breast exhibits no inverted nipple, no mass, no nipple discharge, no skin change and no tenderness.  Right breast at 12 o'clock 3 CFN area of residual thickening.     Abdominal: Soft.    Lymphadenopathy:    She has no cervical adenopathy.    She has no axillary adenopathy.  Neurological: She is alert and oriented to person, place, and time.  Skin: Skin is warm and dry.  Psychiatric: Her behavior is normal.       Assessment    Resolution of multiple right breast abscesses.    Plan    Encouraged Dietician evaluation  as recommended by her Primary Care.  With steady improvement over the last month, and tenderness only on self-exam, I think is reasonable to observe these areas for an additional 2 months.  The patient been encouraged to make use of local heat to the area to accelerate resolution.  She was encouraged to call if she notices worsening or recurrent symptoms.  Follow up in 2 months with office ultrasound.      HPI, Physical Exam, Assessment and Plan have been scribed under the direction and in the presence of Robert Bellow, MD. Karie Fetch, RN  I have completed the exam and reviewed the above documentation for accuracy and completeness.  I agree with the above.   Haematologist has been used and any errors in dictation or transcription are unintentional.  Hervey Ard, M.D., F.A.C.S.  Kristie Olson Permelia Bamba 04/26/2018, 4:55 PM

## 2018-04-25 NOTE — Patient Instructions (Addendum)
The patient is aware to call back for any questions or concerns. Follow up in 2 months with office ultrasound. Encouraged Dietician evaluation as recommended by her Primary Care.

## 2018-06-20 DIAGNOSIS — E1165 Type 2 diabetes mellitus with hyperglycemia: Secondary | ICD-10-CM | POA: Insufficient documentation

## 2018-06-21 ENCOUNTER — Other Ambulatory Visit: Payer: Self-pay | Admitting: Nurse Practitioner

## 2018-06-21 DIAGNOSIS — Z1231 Encounter for screening mammogram for malignant neoplasm of breast: Secondary | ICD-10-CM

## 2018-06-22 ENCOUNTER — Ambulatory Visit: Payer: Self-pay

## 2018-06-22 ENCOUNTER — Encounter: Payer: Self-pay | Admitting: General Surgery

## 2018-06-22 ENCOUNTER — Ambulatory Visit (INDEPENDENT_AMBULATORY_CARE_PROVIDER_SITE_OTHER): Payer: Medicare HMO | Admitting: General Surgery

## 2018-06-22 VITALS — BP 150/80 | HR 78 | Resp 18 | Ht 61.0 in | Wt 213.0 lb

## 2018-06-22 DIAGNOSIS — N611 Abscess of the breast and nipple: Secondary | ICD-10-CM

## 2018-06-22 NOTE — Progress Notes (Signed)
Patient ID: Kristie Olson, female   DOB: 12-Apr-1956, 62 y.o.   MRN: [id number]  Chief Complaint  Patient presents with  . Follow-up    HPI Kristie Olson is a 62 y.o. female.  Here for follow up right breast ultrasound for a right breast abscess. She states the area has improved.  She plans to see the dietician next week.  HPI  Past Medical History:  Diagnosis Date  . Asthma   . Coronary artery disease   . Diabetes mellitus without complication ([place]) 4098  . Hypertension     Past Surgical History:  Procedure Laterality Date  . BREAST MASS EXCISION Left    age 63"s, benign  . CESAREAN SECTION    . CHOLECYSTECTOMY  2009    Family History  Problem Relation Age of Onset  . Breast cancer Sister 85  . Breast cancer Paternal Aunt     Social History Social History   Tobacco Use  . Smoking status: Current Every Day Smoker    Packs/day: 0.50    Types: Cigarettes  . Smokeless tobacco: Never Used  Substance Use Topics  . Alcohol use: No  . Drug use: Not on file    No Known Allergies  Current Outpatient Medications  Medication Sig Dispense Refill  . amitriptyline (ELAVIL) 100 MG tablet Take 50 mg by mouth at bedtime as needed for sleep.    Marland Kitchen aspirin EC 81 MG tablet Take 1 tablet (81 mg total) by mouth daily.    Marland Kitchen atorvastatin (LIPITOR) 10 MG tablet Take 1 tablet (10 mg total) by mouth daily at 6 PM. 30 tablet 0  . FLUoxetine (PROZAC) 40 MG capsule Take 40 mg by mouth daily.    Marland Kitchen gabapentin (NEURONTIN) 100 MG capsule Take 300 mg by mouth at bedtime.    Marland Kitchen glipiZIDE (GLUCOTROL) 10 MG tablet Take 10 mg by mouth daily before breakfast.    . lisinopril-hydrochlorothiazide (PRINZIDE,ZESTORETIC) 20-25 MG tablet Take 1 tablet by mouth daily.     No current facility-administered medications for this visit.     Review of Systems Review of Systems  Constitutional: Negative.   Respiratory: Negative.   Cardiovascular: Negative.     Blood pressure (!) 150/80, pulse 78,  resp. rate 18, height 5\' 1"  (1.549 m), weight 213 lb (96.6 kg).  Physical Exam Physical Exam  Constitutional: She is oriented to person, place, and time. She appears well-developed and well-nourished.  HENT:  Mouth/Throat: Oropharynx is clear and moist.  Eyes: Conjunctivae are normal. No scleral icterus.  Neck: Neck supple.  Cardiovascular: Normal rate, regular rhythm and normal heart sounds.  Pulmonary/Chest: Effort normal and breath sounds normal. Right breast exhibits no inverted nipple, no mass, no nipple discharge, no skin change and no tenderness. Left breast exhibits no inverted nipple, no mass, no nipple discharge, no skin change and no tenderness.    Lymphadenopathy:    She has no cervical adenopathy.    She has no axillary adenopathy.  Neurological: She is alert and oriented to person, place, and time.  Skin: Skin is warm and dry.  Psychiatric: Her behavior is normal.    Data Reviewed Ultrasound examination of the right breast was completed to reassess the area of abscesses previously identified in May of this year.   Scanning in the 1 o'clock position, 9 cm from the nipple, 10 o'clock position 3 cm from the nipple and 2 o'clock position 2 cm from the nipple revealed resolution of all the previously noted inflammatory changes  in the soft tissue between the breast parenchyma and the dermis.  BI-RADS-1.  Assessment    Resolution of previously identified breast abscesses.  Candidate for annual screening mammogram with her PCP.    Plan    Recommend better control of her diabetes. Follow up as needed.     HPI, Physical Exam, Assessment and Plan have been scribed under the direction and in the presence of Robert Bellow, MD. Kristie Fetch, Kristie Olson  I have completed the exam and reviewed the above documentation for accuracy and completeness.  I agree with the above.  Haematologist has been used and any errors in dictation or transcription are unintentional.  Hervey Ard, M.D., F.A.C.S.   Kristie Olson 06/22/2018, 2:36 PM

## 2018-06-22 NOTE — Patient Instructions (Addendum)
The patient is aware to call back for any questions or new concerns.  

## 2018-07-06 ENCOUNTER — Encounter: Admission: RE | Payer: Self-pay | Source: Ambulatory Visit

## 2018-07-06 ENCOUNTER — Ambulatory Visit: Admission: RE | Admit: 2018-07-06 | Payer: Medicare HMO | Source: Ambulatory Visit | Admitting: Internal Medicine

## 2018-07-06 SURGERY — LEFT HEART CATH AND CORONARY ANGIOGRAPHY
Anesthesia: Moderate Sedation | Laterality: Left

## 2018-07-10 ENCOUNTER — Encounter: Payer: Self-pay | Admitting: Emergency Medicine

## 2018-07-10 ENCOUNTER — Emergency Department: Payer: Medicare HMO

## 2018-07-10 DIAGNOSIS — Z7982 Long term (current) use of aspirin: Secondary | ICD-10-CM | POA: Diagnosis not present

## 2018-07-10 DIAGNOSIS — I10 Essential (primary) hypertension: Secondary | ICD-10-CM | POA: Diagnosis not present

## 2018-07-10 DIAGNOSIS — I2510 Atherosclerotic heart disease of native coronary artery without angina pectoris: Secondary | ICD-10-CM | POA: Diagnosis not present

## 2018-07-10 DIAGNOSIS — F17210 Nicotine dependence, cigarettes, uncomplicated: Secondary | ICD-10-CM | POA: Insufficient documentation

## 2018-07-10 DIAGNOSIS — Z79899 Other long term (current) drug therapy: Secondary | ICD-10-CM | POA: Insufficient documentation

## 2018-07-10 DIAGNOSIS — Z7984 Long term (current) use of oral hypoglycemic drugs: Secondary | ICD-10-CM | POA: Diagnosis not present

## 2018-07-10 DIAGNOSIS — E119 Type 2 diabetes mellitus without complications: Secondary | ICD-10-CM | POA: Diagnosis not present

## 2018-07-10 DIAGNOSIS — M25511 Pain in right shoulder: Secondary | ICD-10-CM | POA: Diagnosis not present

## 2018-07-10 LAB — BASIC METABOLIC PANEL
Anion gap: 8 (ref 5–15)
BUN: 16 mg/dL (ref 8–23)
CO2: 27 mmol/L (ref 22–32)
Calcium: 9 mg/dL (ref 8.9–10.3)
Chloride: 101 mmol/L (ref 98–111)
Creatinine, Ser: 0.81 mg/dL (ref 0.44–1.00)
GFR calc Af Amer: >60 mL/min (ref >60)
GFR calc non Af Amer: >60 mL/min (ref >60)
Glucose, Bld: 223 mg/dL — ABNORMAL HIGH (ref 70–99)
Potassium: 4 mmol/L (ref 3.5–5.1)
Sodium: 136 mmol/L (ref 135–145)

## 2018-07-10 LAB — CBC
HCT: 38 % (ref 35.0–47.0)
Hemoglobin: 13.4 g/dL (ref 12.0–16.0)
MCH: 32.9 pg (ref 26.0–34.0)
MCHC: 35.3 g/dL (ref 32.0–36.0)
MCV: 93.3 fL (ref 80.0–100.0)
Platelets: 414 10*3/uL (ref 150–440)
RBC: 4.07 MIL/uL (ref 3.80–5.20)
RDW: 14.3 % (ref 11.5–14.5)
WBC: 10.9 10*3/uL (ref 3.6–11.0)

## 2018-07-10 LAB — TROPONIN I: Troponin I: <0.03 ng/mL (ref <0.03)

## 2018-07-10 NOTE — ED Triage Notes (Addendum)
Pt to triage via w/c with no distress noted, brought in by EMS; c/o right arm pain x 2 days with mid CP this evening radiating to left side; denies any other accomp symptoms; st hx of same and dx with "light stroke"; 324mg  ASA admin by EMS

## 2018-07-11 ENCOUNTER — Emergency Department
Admission: EM | Admit: 2018-07-11 | Discharge: 2018-07-11 | Disposition: A | Discharge status: Home / Self Care | Payer: Medicare HMO | Attending: Emergency Medicine | Admitting: Emergency Medicine

## 2018-07-11 DIAGNOSIS — M25511 Pain in right shoulder: Secondary | ICD-10-CM

## 2018-07-11 MED ORDER — METHYLPREDNISOLONE SODIUM SUCC 125 MG IJ SOLR
125.0000 mg | Freq: Once | INTRAMUSCULAR | Status: AC
  Administered 2018-07-11: 125 mg via INTRAVENOUS

## 2018-07-11 MED ORDER — METHYLPREDNISOLONE SODIUM SUCC 125 MG IJ SOLR
INTRAMUSCULAR | Status: AC
  Filled 2018-07-11: qty 2

## 2018-07-11 MED ORDER — LIDOCAINE 5 % EX PTCH
1.0000 | MEDICATED_PATCH | CUTANEOUS | Status: DC
  Administered 2018-07-11: 1 via TRANSDERMAL
  Filled 2018-07-11: qty 1

## 2018-07-11 MED ORDER — KETOROLAC TROMETHAMINE 30 MG/ML IJ SOLN
30.0000 mg | Freq: Once | INTRAMUSCULAR | Status: AC
  Administered 2018-07-11: 30 mg via INTRAMUSCULAR
  Filled 2018-07-11: qty 1

## 2018-07-11 MED ORDER — DEXAMETHASONE SODIUM PHOSPHATE 10 MG/ML IJ SOLN
10.0000 mg | Freq: Once | INTRAMUSCULAR | Status: DC
  Filled 2018-07-11: qty 1

## 2018-07-11 MED ORDER — TRAMADOL HCL 50 MG PO TABS
50.0000 mg | ORAL_TABLET | Freq: Once | ORAL | Status: AC
  Administered 2018-07-11: 50 mg via ORAL
  Filled 2018-07-11: qty 1

## 2018-07-11 MED ORDER — TRAMADOL HCL 50 MG PO TABS: 50.0000 mg | ORAL_TABLET | Freq: Four times a day (QID) | ORAL | 0 refills | Status: AC | PRN

## 2018-07-11 NOTE — ED Provider Notes (Signed)
[facility] Emergency Department Provider Note    First MD Initiated Contact with Patient 07/11/18 0018     (approximate)  I have reviewed the triage vital signs and the nursing notes.   HISTORY  Chief Complaint Chest Pain    HPI Kristie Olson is a 62 y.o. female with below list of chronic medical conditions including chronic right shoulder pain presents to the emergency department with 10 out of 10 right shoulder pain x2 days is worse with any movement of the shoulder.  Patient currently denies any chest pain or shortness of breath no arm swelling or decrease sensation.   Past Medical History:  Diagnosis Date  . Asthma   . Coronary artery disease   . Diabetes mellitus without complication ([place]) 3007  . Hypertension     Patient Active Problem List   Diagnosis Date Noted  . Breast abscess of female 03/29/2018  . Slurred speech 07/27/2017    Past Surgical History:  Procedure Laterality Date  . BREAST MASS EXCISION Left    age 44"s, benign  . CESAREAN SECTION    . CHOLECYSTECTOMY  2009    Prior to Admission medications   Medication Sig Start Date End Date Taking? Authorizing Provider  acetaminophen (TYLENOL) 500 MG tablet Take 1,000 mg by mouth every 8 (eight) hours as needed for moderate pain.    [provider]  albuterol (PROVENTIL HFA;VENTOLIN HFA) 108 (90 Base) MCG/ACT inhaler Inhale 2 puffs into the lungs every 4 (four) hours as needed for wheezing or shortness of breath.    [provider]  amitriptyline (ELAVIL) 100 MG tablet Take 50 mg by mouth at bedtime as needed for sleep.    [provider]  aspirin 325 MG tablet Take 325 mg by mouth daily.    [provider]  aspirin EC 81 MG tablet Take 1 tablet (81 mg total) by mouth daily. Patient not taking: Reported on 07/04/2018 07/28/17   Hillary Bow, MD  atorvastatin (LIPITOR) 10 MG tablet Take 1 tablet (10 mg total) by mouth daily at 6 PM. Patient  not taking: Reported on 07/03/2018 07/28/17 07/28/18  Hillary Bow, MD  diphenhydrAMINE (BENADRYL) 25 MG tablet Take 25 mg by mouth 2 (two) times daily as needed for itching or allergies.    [provider]  FLUoxetine (PROZAC) 40 MG capsule Take 40 mg by mouth daily.    [provider]  gabapentin (NEURONTIN) 100 MG capsule Take 300 mg by mouth at bedtime.    [provider]  glipiZIDE (GLUCOTROL) 10 MG tablet Take 10 mg by mouth daily before breakfast.    [provider]  lisinopril-hydrochlorothiazide (PRINZIDE,ZESTORETIC) 20-25 MG tablet Take 1 tablet by mouth daily.    [provider]  omeprazole (PRILOSEC) 40 MG capsule Take 40 mg by mouth daily.    [provider]  traMADol (ULTRAM) 50 MG tablet Take 1 tablet (50 mg total) by mouth every 6 (six) hours as needed. 07/11/18 07/11/19  Gregor Hams, MD    Allergies No known drug allergies  Family History  Problem Relation Age of Onset  . Breast cancer Sister 71  . Breast cancer Paternal Aunt     Social History Social History   Tobacco Use  . Smoking status: Current Every Day Smoker    Packs/day: 0.50    Types: Cigarettes  . Smokeless tobacco: Never Used  Substance Use Topics  . Alcohol use: No  . Drug use: Not on file  Review of Systems Constitutional: No fever/chills Eyes: No visual changes. ENT: No sore throat. Cardiovascular: Denies chest pain. Respiratory: Denies shortness of breath. Gastrointestinal: No abdominal pain.  No nausea, no vomiting.  No diarrhea.  No constipation. Genitourinary: Negative for dysuria. Musculoskeletal: Negative for neck pain.  Negative for back pain.  Positive for right shoulder pain Integumentary: Negative for rash. Neurological: Negative for headaches, focal weakness or numbness.   ____________________________________________   PHYSICAL EXAM:  VITAL SIGNS: ED Triage Vitals  Enc Vitals Group     BP 07/10/18 2155 (!) 151/80       Pulse Rate 07/10/18 2155 (!) 106     Resp 07/10/18 2155 18     Temp 07/10/18 2155 98 F (36.7 C)     Temp Source 07/10/18 2155 Oral     SpO2 07/10/18 2155 100 %     Weight 07/10/18 2152 94.8 kg (209 lb)     Height 07/10/18 2152 1.549 m (5\' 1" )     Head Circumference --      Peak Flow --      Pain Score 07/10/18 2150 8     Pain Loc --      Pain Edu? --      Excl. in [place]? --     Constitutional: Alert and oriented. Well appearing and in no acute distress. Eyes: Conjunctivae are normal.  Head: Atraumatic. Mouth/Throat: Mucous membranes are moist.  Oropharynx non-erythematous. Neck: No stridor. No cervical spine tenderness to palpation. Cardiovascular: Normal rate, regular rhythm. Good peripheral circulation. Grossly normal heart sounds. Respiratory: Normal respiratory effort.  No retractions. Lungs CTAB. Gastrointestinal: Soft and nontender. No distention.  Musculoskeletal: Pain to palpation of the anterior and posterior portions of the shoulder.  Pain with active and passive range of motion.  Patient unable to raise beyond 90 degrees abduction secondary to pain.   Neurologic:  Normal speech and language. No gross focal neurologic deficits are appreciated.  Skin:  Skin is warm, dry and intact. No rash noted. Psychiatric: Mood and affect are normal. Speech and behavior are normal.  ____________________________________________   LABS (all labs ordered are listed, but only abnormal results are displayed)  Labs Reviewed  BASIC METABOLIC PANEL - Abnormal; Notable for the following components:      Result Value   Glucose, Bld 223 (*)    All other components within normal limits  CBC  TROPONIN I   ____________________________________________  EKG  ED ECG REPORT I, Cassel N Latarsha Zani, the attending physician, personally viewed and interpreted this ECG.   Date: 07/11/2018  EKG Time: 9:55 PM  Rate: 104  Rhythm: Sinus Tachycardia RBBB  Axis: Normal   Intervals:Normal  ST&T  Change: None  ____________________________________________  RADIOLOGY I, [place] Kylia Grajales, personally viewed and evaluated these images (plain radiographs) as part of my medical decision making, as well as reviewing the written report by the radiologist.  ED MD interpretation: No acute pulmonary process noted on chest x-ray.  Official radiology report(s): Dg Chest 2 View  Result Date: 07/10/2018 CLINICAL DATA:  62 y/o  F; chest pain. EXAM: CHEST - 2 VIEW COMPARISON:  02/07/2018 chest radiograph FINDINGS: Normal cardiac silhouette. Aortic atherosclerosis with calcification. No consolidation, effusion, or pneumothorax. Stable subcentimeter nodule projecting over the left lower lung zone. No pleural effusion or pneumothorax. No acute osseous abnormality is evident. Cholecystectomy clips. IMPRESSION: No acute pulmonary process identified. Stable subcentimeter nodule projecting over left lower lung zone. This can be further characterized with chest CT on a nonemergent  basis. Electronically Signed   By: Kristine Garbe M.D.   On: 07/10/2018 22:18     Procedures   ____________________________________________   INITIAL IMPRESSION / ASSESSMENT AND PLAN / ED COURSE  As part of my medical decision making, I reviewed the following data within the electronic MEDICAL RECORD NUMBER   62 year old female presenting with above-stated history and physical exam secondary to right shoulder pain that is worse with movement.  Concern for possible bursitis versus rotator cuff injury.  Patient given Decadron Toradol and tramadol in the emergency department.  Will prescribe tramadol for home.  Patient has been taking prednisone as prescribed outpatient.  Spoke with patient at length regarding close monitoring of glucose secondary to steroid use.  Patient referred to orthopedic surgery for further outpatient evaluation management.    ____________________________________________  FINAL CLINICAL IMPRESSION(S)  / ED DIAGNOSES  Final diagnoses:  Acute pain of right shoulder     MEDICATIONS GIVEN DURING THIS VISIT:  Medications  lidocaine (LIDODERM) 5 % 1 patch (1 patch Transdermal Patch Applied 07/11/18 0058)  ketorolac (TORADOL) 30 MG/ML injection 30 mg (30 mg Intramuscular Given 07/11/18 0057)  traMADol (ULTRAM) tablet 50 mg (50 mg Oral Given 07/11/18 0057)  methylPREDNISolone sodium succinate (SOLU-MEDROL) 125 mg/2 mL injection 125 mg (125 mg Intravenous Given 07/11/18 0057)     ED Discharge Orders         Ordered    traMADol (ULTRAM) 50 MG tablet  Every 6 hours PRN     07/11/18 0103           Note:  This document was prepared using Dragon voice recognition software and may include unintentional dictation errors.    Gregor Hams, MD 07/11/18 [phone]

## 2018-08-08 ENCOUNTER — Ambulatory Visit
Admission: RE | Admit: 2018-08-08 | Discharge: 2018-08-08 | Disposition: A | Discharge status: Home / Self Care | Payer: Medicare HMO | Source: Ambulatory Visit | Attending: Nurse Practitioner | Admitting: Nurse Practitioner

## 2018-08-08 DIAGNOSIS — Z1231 Encounter for screening mammogram for malignant neoplasm of breast: Secondary | ICD-10-CM | POA: Insufficient documentation

## 2018-08-14 DIAGNOSIS — M5412 Radiculopathy, cervical region: Secondary | ICD-10-CM | POA: Insufficient documentation

## 2018-09-27 ENCOUNTER — Telehealth: Payer: Self-pay | Admitting: *Deleted

## 2018-09-27 NOTE — Telephone Encounter (Signed)
Received referral for low dose lung cancer screening CT scan. Message left at phone number listed in EMR for patient to call me back to facilitate scheduling scan.  

## 2018-10-04 ENCOUNTER — Telehealth: Payer: Self-pay | Admitting: *Deleted

## 2018-10-04 DIAGNOSIS — Z122 Encounter for screening for malignant neoplasm of respiratory organs: Secondary | ICD-10-CM

## 2018-10-04 DIAGNOSIS — Z87891 Personal history of nicotine dependence: Secondary | ICD-10-CM

## 2018-10-04 NOTE — Telephone Encounter (Signed)
Received referral for initial lung cancer screening scan. Contacted patient and obtained smoking history,(current, 34.5 pack year) as well as answering questions related to screening process. Patient denies signs of lung cancer such as weight loss or hemoptysis. Patient denies comorbidity that would prevent curative treatment if lung cancer were found. Patient is scheduled for shared decision making visit and CT scan on 10/24/18 at 145pm.

## 2018-10-05 IMAGING — MR MR HEAD W/O CM
9 of 11 series · 32 of 48 positions shown · non-contrast
Comparison: CT head 07/27/2017

CLINICAL DATA: Slurred speech, altered level of  consciousness.

EXAM:
MRI HEAD WITHOUT CONTRAST
MRA HEAD WITHOUT CONTRAST
TECHNIQUE: Multiplanar, multiecho pulse sequences of the brain and surrounding
structures were obtained without intravenous contrast. Angiographic
images of the head were obtained using MRA technique without
contrast.

[Series 2: GRE · sagittal · 5.0mm · 0.45mm/px · 2 of 21 slices shown (1 of 2)]
[im 1/21]
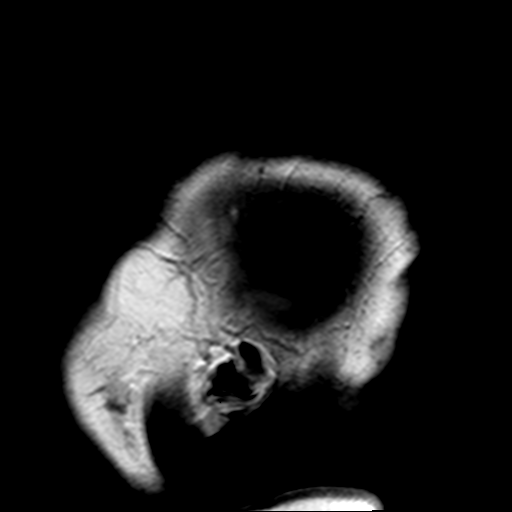
[im 21/21]
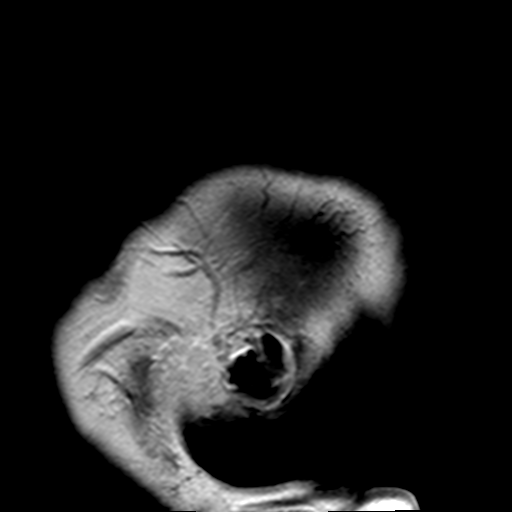

[Series 4: DWI · axial · 3.0mm · 1.80mm/px · z∈[-92,+58]mm · 5 of 52 slices shown (1 of 2)]
[im 1/52]
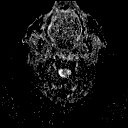
[im 13/52]
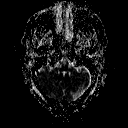
[im 26/52]
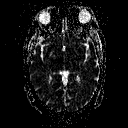
[im 39/52]
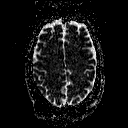
[im 52/52]
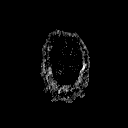

[Series 6: DWI · coronal · 3.0mm · 1.80mm/px · 4 of 43 slices shown (2 of 2)]
[im 1/43]
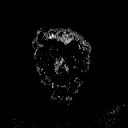
[im 15/43]
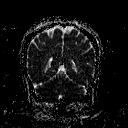
[im 29/43]
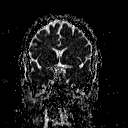
[im 43/43]
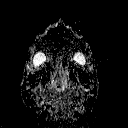

[Series 7: TOF · axial · non-contrast · 0.7mm · 0.37mm/px · z∈[-79,-50]mm · 4 of 110 slices shown]
[im 1/110]
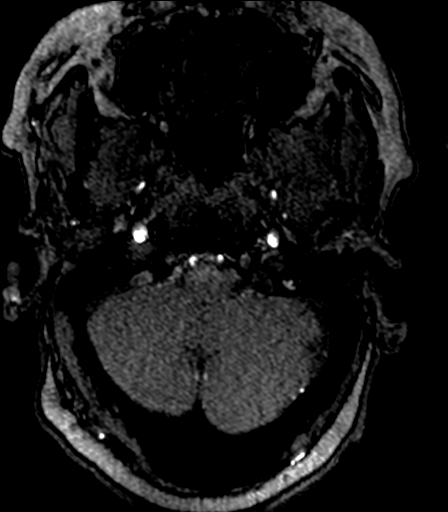
[im 22/110]
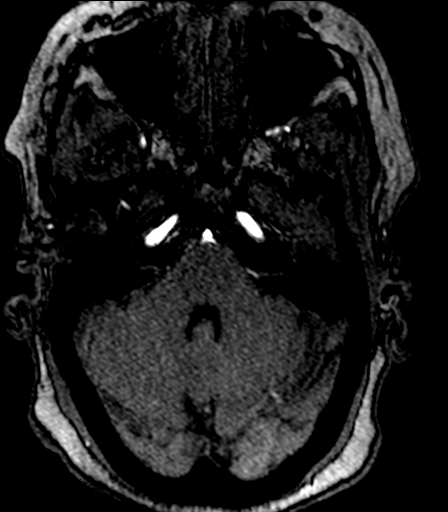
[im 33/110]
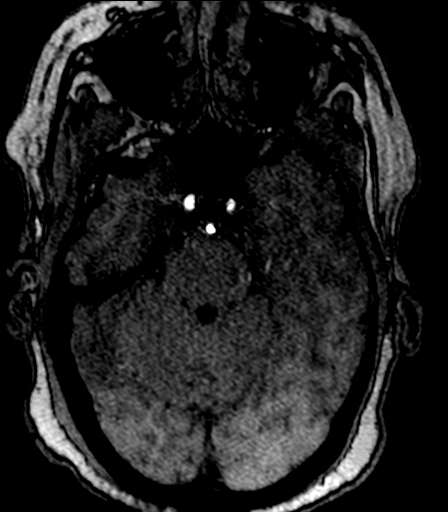
[im 44/110]
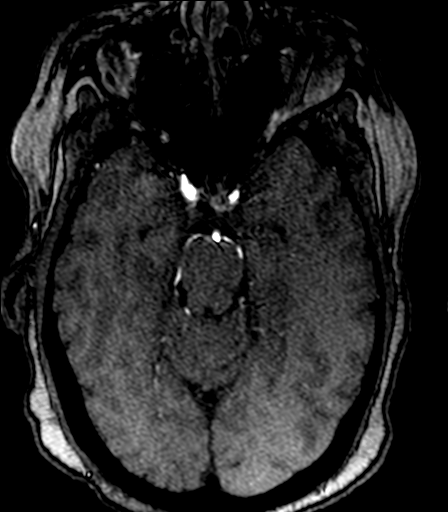

[Series 11: T2 · axial · 5.0mm · 0.45mm/px · z∈[-93,+59]mm · 3 of 25 slices shown (1 of 3)]
[im 1/25]
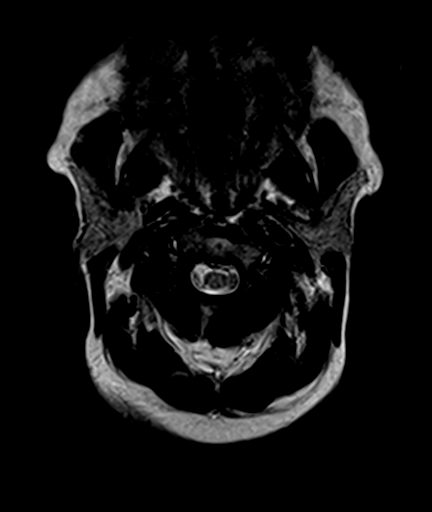
[im 13/25]
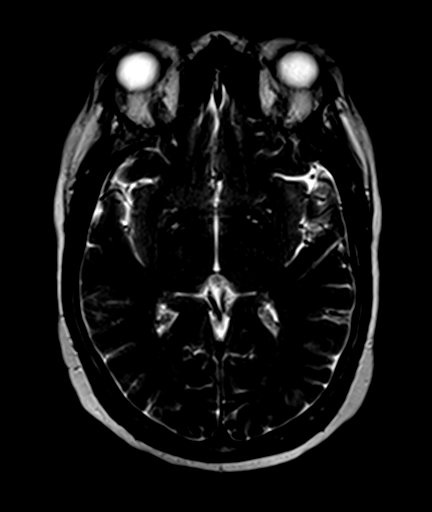
[im 25/25]
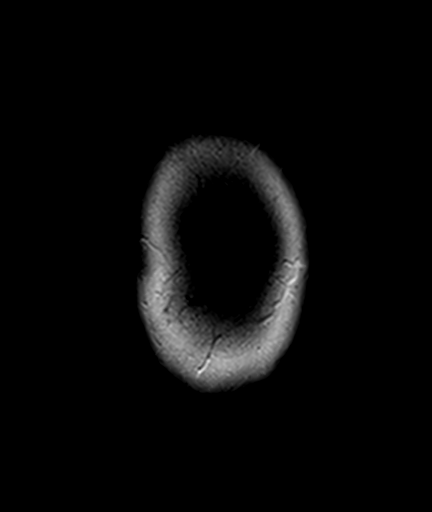

[Series 13: FLAIR · axial · 3.0mm · 0.45mm/px · z∈[-93,+59]mm · 5 of 53 slices shown]
[im 1/53]
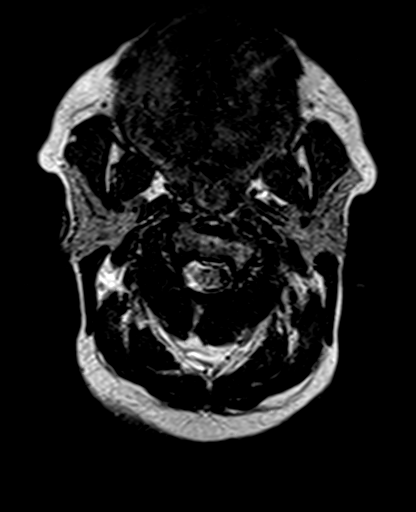
[im 14/53]
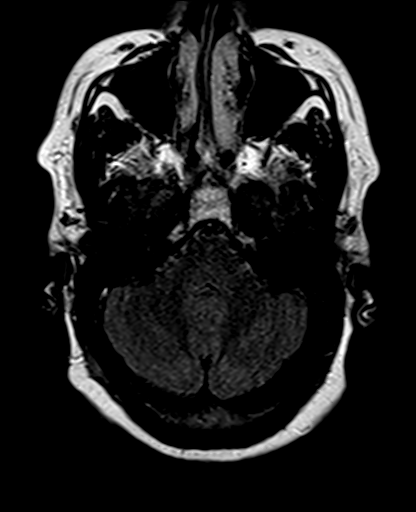
[im 27/53]
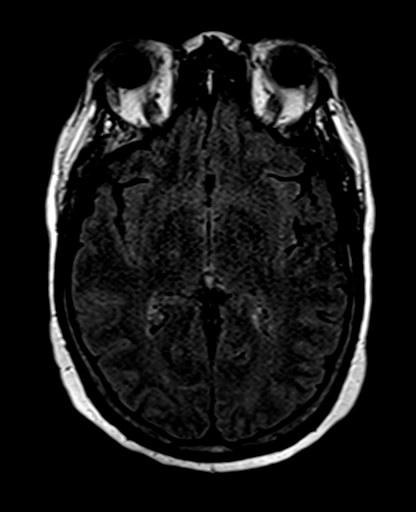
[im 40/53]
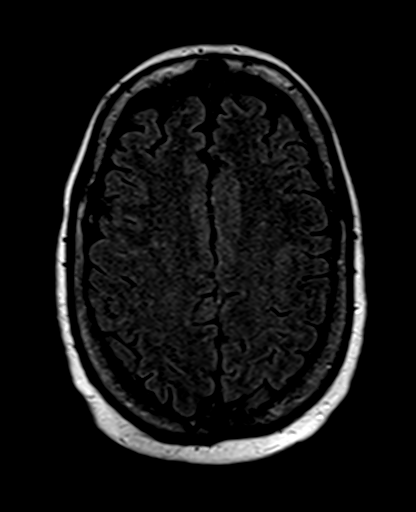
[im 53/53]
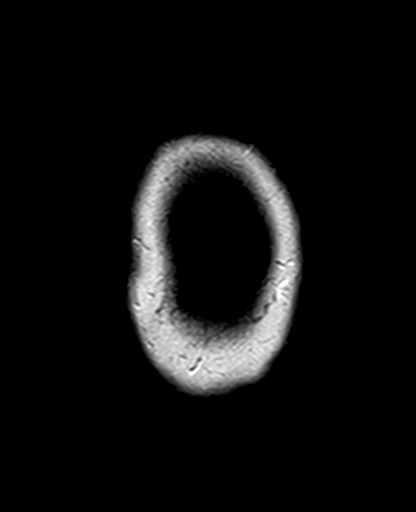

[Series 14: T2 · axial · 5.0mm · 1.20mm/px · z∈[-94,+59]mm · 3 of 25 slices shown (2 of 3)]
[im 1/25]
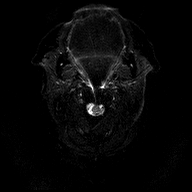
[im 13/25]
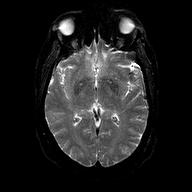
[im 25/25]
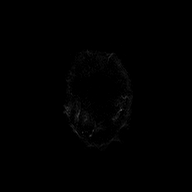

[Series 15: GRE · axial · 5.0mm · 0.45mm/px · z∈[-94,+59]mm · 3 of 25 slices shown (2 of 2)]
[im 1/25]
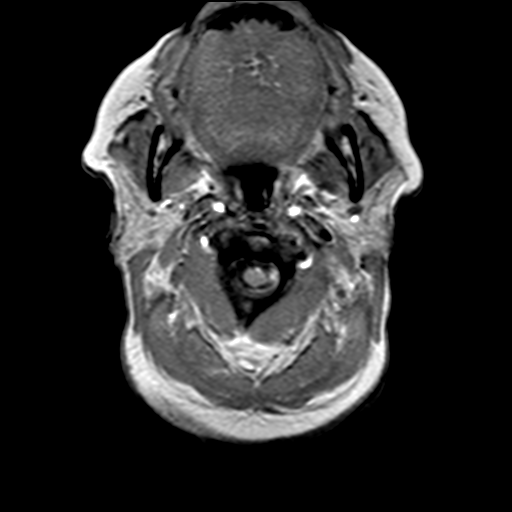
[im 13/25]
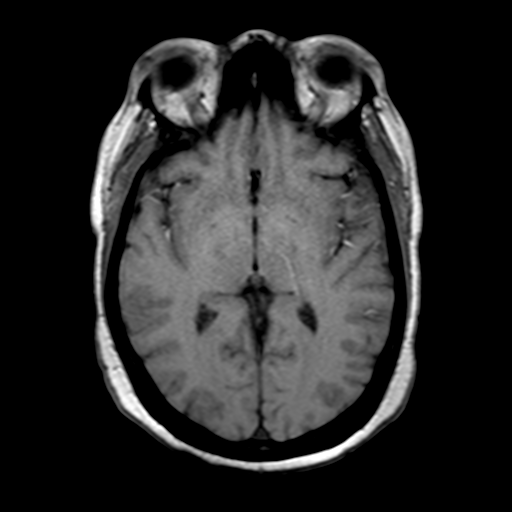
[im 25/25]
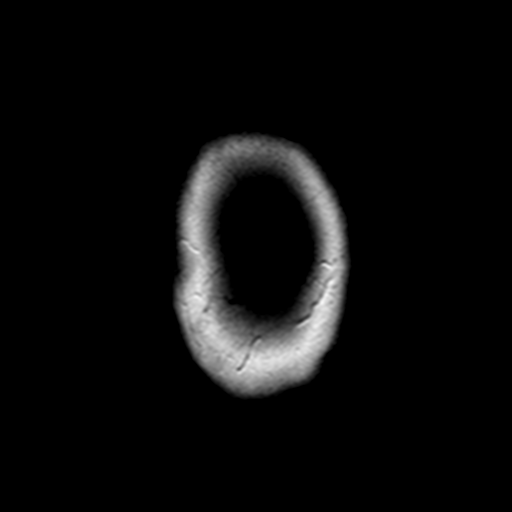

[Series 16: T2 · coronal · 5.0mm · 0.45mm/px · 3 of 27 slices shown (3 of 3)]
[im 1/27]
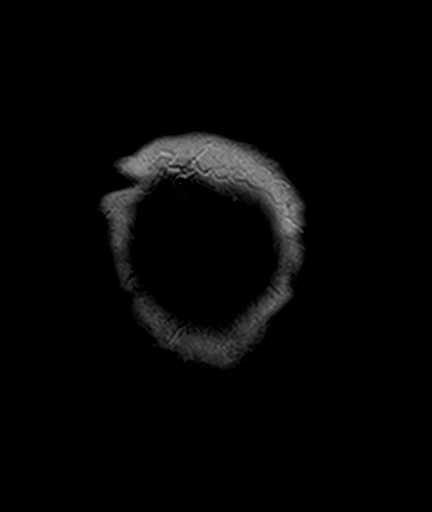
[im 14/27]
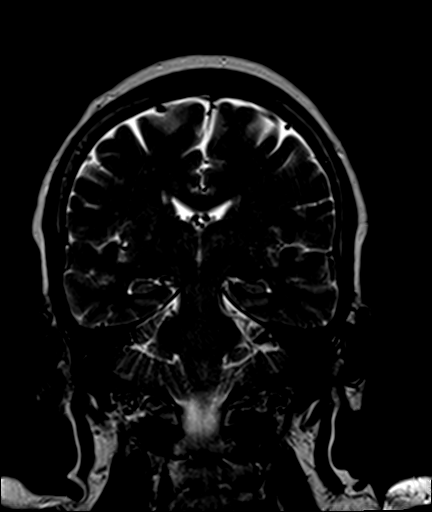
[im 27/27]
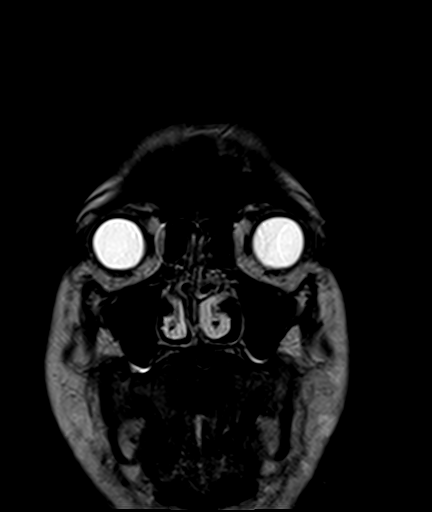

[32 of 48 positions shown; findings below may reference images not displayed]

FINDINGS: MRI HEAD FINDINGS

Brain: Negative for acute infarct. Ventricle size normal. No
significant chronic ischemia. Negative for hemorrhage or mass.
Cerebral volume normal.

Vascular: Normal arterial flow voids

Skull and upper cervical spine: Negative

Sinuses/Orbits: Mild mucosal edema paranasal sinuses. Bilateral lens
replacement

Other: None

MRA HEAD FINDINGS

Moderate stenosis left cavernous carotid and mild stenosis right
cavernous carotid. Anterior and middle cerebral artery patent
without significant stenosis or occlusion

Both vertebral arteries patent to the basilar. PICA not included on
the scan due to patient positioning. Basilar widely patent. AICA,
superior cerebellar, and posterior cerebral artery patent. Fetal
origin right posterior cerebral artery.

Negative for cerebral aneurysm.
IMPRESSION: Negative MRI head

Negative MRA head

## 2018-10-24 ENCOUNTER — Ambulatory Visit
Admission: RE | Admit: 2018-10-24 | Discharge: 2018-10-24 | Disposition: A | Discharge status: Home / Self Care | Payer: Medicare HMO | Source: Ambulatory Visit | Attending: Oncology | Admitting: Oncology

## 2018-10-24 ENCOUNTER — Inpatient Hospital Stay: Payer: Medicare HMO | Attending: Oncology | Admitting: Oncology

## 2018-10-24 ENCOUNTER — Encounter: Payer: Self-pay | Admitting: Oncology

## 2018-10-24 DIAGNOSIS — Z87891 Personal history of nicotine dependence: Secondary | ICD-10-CM | POA: Diagnosis not present

## 2018-10-24 DIAGNOSIS — Z122 Encounter for screening for malignant neoplasm of respiratory organs: Secondary | ICD-10-CM | POA: Diagnosis not present

## 2018-10-24 NOTE — Progress Notes (Signed)
In accordance with CMS guidelines, patient has met eligibility criteria including age, absence of signs or symptoms of lung cancer.  Social History   Tobacco Use  . Smoking status: Current Every Day Smoker    Packs/day: 0.75    Years: 46.00    Pack years: 34.50    Types: Cigarettes  . Smokeless tobacco: Never Used  Substance Use Topics  . Alcohol use: No  . Drug use: Not on file     A shared decision-making session was conducted prior to the performance of CT scan. This includes one or more decision aids, includes benefits and harms of screening, follow-up diagnostic testing, over-diagnosis, false positive rate, and total radiation exposure.  Counseling on the importance of adherence to annual lung cancer LDCT screening, impact of co-morbidities, and ability or willingness to undergo diagnosis and treatment is imperative for compliance of the program.  Counseling on the importance of continued smoking cessation for former smokers; the importance of smoking cessation for current smokers, and information about tobacco cessation interventions have been given to patient including [place] and [employer] quit [place] programs.  Written order for lung cancer screening with LDCT has been given to the patient and any and all questions have been answered to the best of my abilities.   Yearly follow up will be coordinated by Burgess Estelle, Thoracic Navigator.  Faythe Casa, NP 10/24/2018 2:39 PM

## 2018-10-27 ENCOUNTER — Encounter: Payer: Self-pay | Admitting: *Deleted

## 2018-11-26 ENCOUNTER — Emergency Department: Payer: Medicare Other

## 2018-11-26 ENCOUNTER — Other Ambulatory Visit: Payer: Self-pay

## 2018-11-26 ENCOUNTER — Observation Stay
Admission: EM | Admit: 2018-11-26 | Discharge: 2018-11-30 | Disposition: A | Discharge status: Home / Self Care | Payer: Medicare Other | Attending: Internal Medicine | Admitting: Internal Medicine

## 2018-11-26 ENCOUNTER — Encounter: Payer: Self-pay | Admitting: Emergency Medicine

## 2018-11-26 DIAGNOSIS — R9431 Abnormal electrocardiogram [ECG] [EKG]: Secondary | ICD-10-CM | POA: Insufficient documentation

## 2018-11-26 DIAGNOSIS — I10 Essential (primary) hypertension: Secondary | ICD-10-CM | POA: Insufficient documentation

## 2018-11-26 DIAGNOSIS — I2510 Atherosclerotic heart disease of native coronary artery without angina pectoris: Secondary | ICD-10-CM | POA: Diagnosis not present

## 2018-11-26 DIAGNOSIS — I4510 Unspecified right bundle-branch block: Secondary | ICD-10-CM | POA: Diagnosis not present

## 2018-11-26 DIAGNOSIS — M79642 Pain in left hand: Secondary | ICD-10-CM | POA: Diagnosis not present

## 2018-11-26 DIAGNOSIS — J441 Chronic obstructive pulmonary disease with (acute) exacerbation: Secondary | ICD-10-CM | POA: Insufficient documentation

## 2018-11-26 DIAGNOSIS — M25512 Pain in left shoulder: Secondary | ICD-10-CM | POA: Insufficient documentation

## 2018-11-26 DIAGNOSIS — F17210 Nicotine dependence, cigarettes, uncomplicated: Secondary | ICD-10-CM | POA: Insufficient documentation

## 2018-11-26 DIAGNOSIS — R739 Hyperglycemia, unspecified: Secondary | ICD-10-CM | POA: Diagnosis present

## 2018-11-26 DIAGNOSIS — M25511 Pain in right shoulder: Secondary | ICD-10-CM | POA: Diagnosis not present

## 2018-11-26 DIAGNOSIS — R296 Repeated falls: Secondary | ICD-10-CM | POA: Diagnosis not present

## 2018-11-26 DIAGNOSIS — Z794 Long term (current) use of insulin: Secondary | ICD-10-CM | POA: Diagnosis not present

## 2018-11-26 DIAGNOSIS — M25571 Pain in right ankle and joints of right foot: Secondary | ICD-10-CM | POA: Insufficient documentation

## 2018-11-26 DIAGNOSIS — Z9049 Acquired absence of other specified parts of digestive tract: Secondary | ICD-10-CM | POA: Diagnosis not present

## 2018-11-26 DIAGNOSIS — Z7982 Long term (current) use of aspirin: Secondary | ICD-10-CM | POA: Insufficient documentation

## 2018-11-26 DIAGNOSIS — Z716 Tobacco abuse counseling: Secondary | ICD-10-CM | POA: Insufficient documentation

## 2018-11-26 DIAGNOSIS — J440 Chronic obstructive pulmonary disease with acute lower respiratory infection: Secondary | ICD-10-CM | POA: Diagnosis not present

## 2018-11-26 DIAGNOSIS — M25572 Pain in left ankle and joints of left foot: Secondary | ICD-10-CM | POA: Diagnosis not present

## 2018-11-26 DIAGNOSIS — T380X5A Adverse effect of glucocorticoids and synthetic analogues, initial encounter: Secondary | ICD-10-CM | POA: Insufficient documentation

## 2018-11-26 DIAGNOSIS — E1165 Type 2 diabetes mellitus with hyperglycemia: Secondary | ICD-10-CM | POA: Diagnosis not present

## 2018-11-26 DIAGNOSIS — J40 Bronchitis, not specified as acute or chronic: Secondary | ICD-10-CM | POA: Diagnosis present

## 2018-11-26 DIAGNOSIS — M79641 Pain in right hand: Secondary | ICD-10-CM | POA: Insufficient documentation

## 2018-11-26 DIAGNOSIS — R000 Tachycardia, unspecified: Secondary | ICD-10-CM | POA: Insufficient documentation

## 2018-11-26 DIAGNOSIS — M6281 Muscle weakness (generalized): Secondary | ICD-10-CM | POA: Diagnosis not present

## 2018-11-26 DIAGNOSIS — J209 Acute bronchitis, unspecified: Secondary | ICD-10-CM | POA: Insufficient documentation

## 2018-11-26 DIAGNOSIS — Z79899 Other long term (current) drug therapy: Secondary | ICD-10-CM | POA: Diagnosis not present

## 2018-11-26 DIAGNOSIS — F329 Major depressive disorder, single episode, unspecified: Secondary | ICD-10-CM | POA: Diagnosis not present

## 2018-11-26 LAB — RESPIRATORY PANEL BY PCR

## 2018-11-26 LAB — URINALYSIS, COMPLETE (UACMP) WITH MICROSCOPIC
Bacteria, UA: NONE SEEN
Bilirubin Urine: NEGATIVE
Glucose, UA: >=500 mg/dL — AB
Hgb urine dipstick: NEGATIVE
Ketones, ur: 5 mg/dL — AB
Leukocytes, UA: NEGATIVE
Nitrite: NEGATIVE
Protein, ur: NEGATIVE mg/dL
Specific Gravity, Urine: 1.026 (ref 1.005–1.030)
Squamous Epithelial / LPF: NONE SEEN (ref 0–5)
pH: 7 (ref 5.0–8.0)

## 2018-11-26 LAB — COMPREHENSIVE METABOLIC PANEL
ALT: 15 U/L (ref 0–44)
AST: 16 U/L (ref 15–41)
Albumin: 3.1 g/dL — ABNORMAL LOW (ref 3.5–5.0)
Alkaline Phosphatase: 116 U/L (ref 38–126)
Anion gap: 12 (ref 5–15)
BUN: 11 mg/dL (ref 8–23)
CO2: 27 mmol/L (ref 22–32)
Calcium: 8.9 mg/dL (ref 8.9–10.3)
Chloride: 97 mmol/L — ABNORMAL LOW (ref 98–111)
Creatinine, Ser: 0.8 mg/dL (ref 0.44–1.00)
GFR calc Af Amer: >60 mL/min (ref >60)
GFR calc non Af Amer: >60 mL/min (ref >60)
Glucose, Bld: 453 mg/dL — ABNORMAL HIGH (ref 70–99)
Potassium: 4.1 mmol/L (ref 3.5–5.1)
Sodium: 136 mmol/L (ref 135–145)
Total Bilirubin: 0.4 mg/dL (ref 0.3–1.2)
Total Protein: 7.6 g/dL (ref 6.5–8.1)

## 2018-11-26 LAB — CBC WITH DIFFERENTIAL/PLATELET
Abs Immature Granulocytes: 0.24 10*3/uL — ABNORMAL HIGH (ref 0.00–0.07)
Basophils Absolute: 0 10*3/uL (ref 0.0–0.1)
Basophils Relative: 0 %
Eosinophils Absolute: 0 10*3/uL (ref 0.0–0.5)
Eosinophils Relative: 0 %
HCT: 36.3 % (ref 36.0–46.0)
Hemoglobin: 12 g/dL (ref 12.0–15.0)
Immature Granulocytes: 3 %
Lymphocytes Relative: 40 %
Lymphs Abs: 3.7 10*3/uL (ref 0.7–4.0)
MCH: 30.6 pg (ref 26.0–34.0)
MCHC: 33.1 g/dL (ref 30.0–36.0)
MCV: 92.6 fL (ref 80.0–100.0)
Monocytes Absolute: 0.9 10*3/uL (ref 0.1–1.0)
Monocytes Relative: 10 %
Neutro Abs: 4.4 10*3/uL (ref 1.7–7.7)
Neutrophils Relative %: 47 %
Platelets: 570 10*3/uL — ABNORMAL HIGH (ref 150–400)
RBC: 3.92 MIL/uL (ref 3.87–5.11)
RDW: 12.7 % (ref 11.5–15.5)
WBC: 9.4 10*3/uL (ref 4.0–10.5)
nRBC: 0 % (ref 0.0–0.2)

## 2018-11-26 LAB — GLUCOSE, CAPILLARY
Glucose-Capillary: 427 mg/dL — ABNORMAL HIGH (ref 70–99)
Glucose-Capillary: 427 mg/dL — ABNORMAL HIGH (ref 70–99)
Glucose-Capillary: 436 mg/dL — ABNORMAL HIGH (ref 70–99)
Glucose-Capillary: 414 mg/dL — ABNORMAL HIGH (ref 70–99)
Glucose-Capillary: 372 mg/dL — ABNORMAL HIGH (ref 70–99)

## 2018-11-26 LAB — INFLUENZA PANEL BY PCR (TYPE A & B)
Influenza A By PCR: NEGATIVE
Influenza B By PCR: NEGATIVE

## 2018-11-26 LAB — TROPONIN I: Troponin I: <0.03 ng/mL (ref <0.03)

## 2018-11-26 MED ORDER — IPRATROPIUM-ALBUTEROL 0.5-2.5 (3) MG/3ML IN SOLN
3.0000 mL | Freq: Once | RESPIRATORY_TRACT | Status: AC
  Administered 2018-11-26: 3 mL via RESPIRATORY_TRACT
  Filled 2018-11-26: qty 3

## 2018-11-26 MED ORDER — INSULIN ASPART 100 UNIT/ML ~~LOC~~ SOLN
0.0000 [IU] | Freq: Three times a day (TID) | SUBCUTANEOUS | Status: DC
  Administered 2018-11-27 (×2): 20 [IU] via SUBCUTANEOUS
  Administered 2018-11-27: 11 [IU] via SUBCUTANEOUS
  Administered 2018-11-28: 20 [IU] via SUBCUTANEOUS
  Administered 2018-11-28: 4 [IU] via SUBCUTANEOUS
  Administered 2018-11-28: 11 [IU] via SUBCUTANEOUS
  Administered 2018-11-29: 20 [IU] via SUBCUTANEOUS
  Administered 2018-11-29 (×2): 15 [IU] via SUBCUTANEOUS
  Administered 2018-11-30: 20 [IU] via SUBCUTANEOUS
  Administered 2018-11-30: 11 [IU] via SUBCUTANEOUS
  Filled 2018-11-26 (×11): qty 1

## 2018-11-26 MED ORDER — DOXYCYCLINE HYCLATE 100 MG PO TABS
100.0000 mg | ORAL_TABLET | Freq: Two times a day (BID) | ORAL | Status: DC
  Administered 2018-11-26 – 2018-11-30 (×9): 100 mg via ORAL
  Filled 2018-11-26 (×9): qty 1

## 2018-11-26 MED ORDER — INSULIN ASPART 100 UNIT/ML ~~LOC~~ SOLN
6.0000 [IU] | Freq: Once | SUBCUTANEOUS | Status: AC
  Administered 2018-11-26: 6 [IU] via INTRAVENOUS
  Filled 2018-11-26: qty 1

## 2018-11-26 MED ORDER — METHYLPREDNISOLONE SODIUM SUCC 40 MG IJ SOLR: 40.0000 mg | Freq: Every day | INTRAMUSCULAR | Status: DC

## 2018-11-26 MED ORDER — GUAIFENESIN-DM 100-10 MG/5ML PO SYRP
5.0000 mL | ORAL_SOLUTION | ORAL | Status: DC | PRN
  Administered 2018-11-26 – 2018-11-29 (×12): 5 mL via ORAL
  Filled 2018-11-26 (×12): qty 5

## 2018-11-26 MED ORDER — LEVALBUTEROL HCL 1.25 MG/0.5ML IN NEBU
1.2500 mg | INHALATION_SOLUTION | Freq: Four times a day (QID) | RESPIRATORY_TRACT | Status: DC | PRN
  Administered 2018-11-26 – 2018-11-27 (×2): 1.25 mg via RESPIRATORY_TRACT
  Filled 2018-11-26 (×3): qty 0.5

## 2018-11-26 MED ORDER — ONDANSETRON HCL 4 MG PO TABS: 4.0000 mg | ORAL_TABLET | Freq: Four times a day (QID) | ORAL | Status: DC | PRN

## 2018-11-26 MED ORDER — INSULIN ASPART 100 UNIT/ML ~~LOC~~ SOLN
18.0000 [IU] | Freq: Once | SUBCUTANEOUS | Status: AC
  Administered 2018-11-26: 18 [IU] via SUBCUTANEOUS
  Filled 2018-11-26: qty 1

## 2018-11-26 MED ORDER — IPRATROPIUM-ALBUTEROL 0.5-2.5 (3) MG/3ML IN SOLN: 3.0000 mL | Freq: Three times a day (TID) | RESPIRATORY_TRACT | Status: DC

## 2018-11-26 MED ORDER — SODIUM CHLORIDE 0.9 % IV SOLN
INTRAVENOUS | Status: AC
  Administered 2018-11-26 – 2018-11-27 (×2): via INTRAVENOUS

## 2018-11-26 MED ORDER — INSULIN ASPART 100 UNIT/ML ~~LOC~~ SOLN: 0.0000 [IU] | Freq: Three times a day (TID) | SUBCUTANEOUS | Status: DC

## 2018-11-26 MED ORDER — ACETAMINOPHEN 325 MG PO TABS
650.0000 mg | ORAL_TABLET | Freq: Four times a day (QID) | ORAL | Status: DC | PRN
  Administered 2018-11-26 – 2018-11-30 (×10): 650 mg via ORAL
  Filled 2018-11-26 (×10): qty 2

## 2018-11-26 MED ORDER — INSULIN ASPART 100 UNIT/ML ~~LOC~~ SOLN
25.0000 [IU] | Freq: Once | SUBCUTANEOUS | Status: AC
  Administered 2018-11-26: 25 [IU] via SUBCUTANEOUS
  Filled 2018-11-26: qty 1

## 2018-11-26 MED ORDER — METHYLPREDNISOLONE SODIUM SUCC 40 MG IJ SOLR
40.0000 mg | Freq: Two times a day (BID) | INTRAMUSCULAR | Status: DC
  Administered 2018-11-26: 40 mg via INTRAVENOUS
  Filled 2018-11-26: qty 1

## 2018-11-26 MED ORDER — ONDANSETRON HCL 4 MG/2ML IJ SOLN: 4.0000 mg | Freq: Four times a day (QID) | INTRAMUSCULAR | Status: DC | PRN

## 2018-11-26 MED ORDER — DOCUSATE SODIUM 100 MG PO CAPS
100.0000 mg | ORAL_CAPSULE | Freq: Two times a day (BID) | ORAL | Status: DC
  Administered 2018-11-26 – 2018-11-30 (×9): 100 mg via ORAL
  Filled 2018-11-26 (×9): qty 1

## 2018-11-26 MED ORDER — HYDROCOD POLST-CPM POLST ER 10-8 MG/5ML PO SUER
5.0000 mL | Freq: Two times a day (BID) | ORAL | Status: DC
  Administered 2018-11-26 – 2018-11-29 (×7): 5 mL via ORAL
  Filled 2018-11-26 (×8): qty 5

## 2018-11-26 MED ORDER — DOXYCYCLINE HYCLATE 100 MG PO TABS
100.0000 mg | ORAL_TABLET | Freq: Once | ORAL | Status: AC
  Administered 2018-11-26: 100 mg via ORAL
  Filled 2018-11-26: qty 1

## 2018-11-26 MED ORDER — INSULIN GLARGINE 100 UNIT/ML ~~LOC~~ SOLN
40.0000 [IU] | Freq: Every day | SUBCUTANEOUS | Status: DC
  Administered 2018-11-26: 40 [IU] via SUBCUTANEOUS
  Filled 2018-11-26 (×2): qty 0.4

## 2018-11-26 MED ORDER — ACETAMINOPHEN 650 MG RE SUPP: 650.0000 mg | Freq: Four times a day (QID) | RECTAL | Status: DC | PRN

## 2018-11-26 MED ORDER — INSULIN ASPART 100 UNIT/ML ~~LOC~~ SOLN
22.0000 [IU] | Freq: Once | SUBCUTANEOUS | Status: AC
  Administered 2018-11-26: 22 [IU] via SUBCUTANEOUS
  Filled 2018-11-26: qty 1

## 2018-11-26 MED ORDER — ALBUTEROL SULFATE (2.5 MG/3ML) 0.083% IN NEBU
2.5000 mg | INHALATION_SOLUTION | RESPIRATORY_TRACT | Status: DC | PRN
  Filled 2018-11-26: qty 3

## 2018-11-26 MED ORDER — INSULIN DETEMIR 100 UNIT/ML ~~LOC~~ SOLN
10.0000 [IU] | Freq: Once | SUBCUTANEOUS | Status: AC
  Administered 2018-11-26: 10 [IU] via SUBCUTANEOUS
  Filled 2018-11-26: qty 0.1

## 2018-11-26 MED ORDER — METHYLPREDNISOLONE SODIUM SUCC 125 MG IJ SOLR
125.0000 mg | Freq: Once | INTRAMUSCULAR | Status: AC
  Administered 2018-11-26: 125 mg via INTRAVENOUS
  Filled 2018-11-26: qty 2

## 2018-11-26 MED ORDER — LISINOPRIL-HYDROCHLOROTHIAZIDE 20-25 MG PO TABS: 1.0000 | ORAL_TABLET | Freq: Every day | ORAL | Status: DC

## 2018-11-26 MED ORDER — LISINOPRIL 10 MG PO TABS
20.0000 mg | ORAL_TABLET | Freq: Every day | ORAL | Status: DC
  Administered 2018-11-26 – 2018-11-29 (×4): 20 mg via ORAL
  Filled 2018-11-26 (×4): qty 2

## 2018-11-26 MED ORDER — ENOXAPARIN SODIUM 40 MG/0.4ML ~~LOC~~ SOLN
40.0000 mg | SUBCUTANEOUS | Status: DC
  Administered 2018-11-26 – 2018-11-29 (×4): 40 mg via SUBCUTANEOUS
  Filled 2018-11-26 (×4): qty 0.4

## 2018-11-26 MED ORDER — HYDROCHLOROTHIAZIDE 25 MG PO TABS
25.0000 mg | ORAL_TABLET | Freq: Every day | ORAL | Status: DC
  Administered 2018-11-26 – 2018-11-29 (×4): 25 mg via ORAL
  Filled 2018-11-26 (×4): qty 1

## 2018-11-26 MED ORDER — INSULIN GLARGINE 100 UNIT/ML ~~LOC~~ SOLN
50.0000 [IU] | Freq: Every day | SUBCUTANEOUS | Status: DC
  Filled 2018-11-26: qty 0.5

## 2018-11-26 MED ORDER — SODIUM CHLORIDE 0.9 % IV BOLUS
500.0000 mL | Freq: Once | INTRAVENOUS | Status: AC
  Administered 2018-11-26: 500 mL via INTRAVENOUS

## 2018-11-26 MED ORDER — ACETAMINOPHEN 500 MG PO TABS
1000.0000 mg | ORAL_TABLET | Freq: Once | ORAL | Status: AC
  Administered 2018-11-26: 1000 mg via ORAL
  Filled 2018-11-26: qty 2

## 2018-11-26 MED ORDER — IPRATROPIUM-ALBUTEROL 0.5-2.5 (3) MG/3ML IN SOLN
3.0000 mL | Freq: Three times a day (TID) | RESPIRATORY_TRACT | Status: DC
  Administered 2018-11-26 – 2018-11-30 (×12): 3 mL via RESPIRATORY_TRACT
  Filled 2018-11-26 (×14): qty 3

## 2018-11-26 NOTE — H&P (Signed)
Kristie Olson is an 63 y.o. female.   Chief Complaint: Cough HPI: The patient with past medical history of hypertension, diabetes and coronary artery disease presents to the emergency department due to cough.  The cough is productive but patient denies fever.  She admits to general malaise as well as some shortness of breath.  The patient received breathing treatments in the emergency department but remained wheezing.  Due to continued increased respiratory effort and discomfort the emergency department staff asked the hospitalist service for further further management  Past Medical History:  Diagnosis Date  . Asthma   . Coronary artery disease   . Diabetes mellitus without complication ([place]) 5956  . Hypertension     Past Surgical History:  Procedure Laterality Date  . BREAST EXCISIONAL BIOPSY Left 1980's   neg  . BREAST MASS EXCISION Left    age 71"s, benign  . CESAREAN SECTION    . CHOLECYSTECTOMY  2009    Family History  Problem Relation Age of Onset  . Breast cancer Sister 11  . Breast cancer Paternal Aunt    Social History:  reports that she has been smoking cigarettes. She has a 34.50 pack-year smoking history. She has never used smokeless tobacco. She reports that she does not drink alcohol. No history on file for drug.  Allergies: No Known Allergies  Medications Prior to Admission  Medication Sig Dispense Refill  . FLUoxetine (PROZAC) 40 MG capsule Take 40 mg by mouth daily.    Marland Kitchen gabapentin (NEURONTIN) 100 MG capsule Take 300 mg by mouth every evening.     Marland Kitchen glipiZIDE (GLUCOTROL) 10 MG tablet Take 10 mg by mouth daily with lunch.     . insulin aspart protamine- aspart (NOVOLOG MIX 70/30) (70-30) 100 UNIT/ML injection Inject 35-75 Units into the skin 2 (two) times daily with a meal. 75 units in the morning and 35 units at night    . lisinopril-hydrochlorothiazide (PRINZIDE,ZESTORETIC) 20-25 MG tablet Take 1 tablet by mouth daily.    Marland Kitchen acetaminophen (TYLENOL) 500 MG  tablet Take 1,000 mg by mouth every 8 (eight) hours as needed for moderate pain.    Marland Kitchen albuterol (PROVENTIL HFA;VENTOLIN HFA) 108 (90 Base) MCG/ACT inhaler Inhale 2 puffs into the lungs every 4 (four) hours as needed for wheezing or shortness of breath.    Marland Kitchen amitriptyline (ELAVIL) 100 MG tablet Take 50 mg by mouth at bedtime as needed for sleep.    Marland Kitchen aspirin 325 MG tablet Take 325 mg by mouth daily.    Marland Kitchen aspirin EC 81 MG tablet Take 1 tablet (81 mg total) by mouth daily. (Patient not taking: Reported on 07/04/2018)    . atorvastatin (LIPITOR) 10 MG tablet Take 1 tablet (10 mg total) by mouth daily at 6 PM. (Patient not taking: Reported on 07/03/2018) 30 tablet 0  . diphenhydrAMINE (BENADRYL) 25 MG tablet Take 25 mg by mouth 2 (two) times daily as needed for itching or allergies.    Marland Kitchen omeprazole (PRILOSEC) 40 MG capsule Take 40 mg by mouth daily.    . traMADol (ULTRAM) 50 MG tablet Take 1 tablet (50 mg total) by mouth every 6 (six) hours as needed. 20 tablet 0    Results for orders placed or performed during the hospital encounter of 11/26/18 (from the past 48 hour(s))  Influenza panel by PCR (type A & B)     Status: None   Collection Time: 11/26/18  1:05 AM  Result Value Ref Range   Influenza A  By PCR NEGATIVE NEGATIVE   Influenza B By PCR NEGATIVE NEGATIVE    Comment: (NOTE) The Xpert Xpress Flu assay is intended as an aid in the diagnosis of  influenza and should not be used as a sole basis for treatment.  This  assay is FDA approved for nasopharyngeal swab specimens only. Nasal  washings and aspirates are unacceptable for Xpert Xpress Flu testing. Performed at [facility], [address] [place], [place] [address]   CBC with Differential/Platelet     Status: Abnormal   Collection Time: 11/26/18  1:05 AM  Result Value Ref Range   WBC 9.4 4.0 - 10.5 K/uL   RBC 3.92 3.87 - 5.11 MIL/uL   Hemoglobin 12.0 12.0 - 15.0 g/dL   HCT 36.3 36.0 - 46.0 %   MCV 92.6 80.0 - 100.0 fL    MCH 30.6 26.0 - 34.0 pg   MCHC 33.1 30.0 - 36.0 g/dL   RDW 12.7 11.5 - 15.5 %   Platelets 570 (H) 150 - 400 K/uL   nRBC 0.0 0.0 - 0.2 %   Neutrophils Relative % 47 %   Neutro Abs 4.4 1.7 - 7.7 K/uL   Lymphocytes Relative 40 %   Lymphs Abs 3.7 0.7 - 4.0 K/uL   Monocytes Relative 10 %   Monocytes Absolute 0.9 0.1 - 1.0 K/uL   Eosinophils Relative 0 %   Eosinophils Absolute 0.0 0.0 - 0.5 K/uL   Basophils Relative 0 %   Basophils Absolute 0.0 0.0 - 0.1 K/uL   Immature Granulocytes 3 %   Abs Immature Granulocytes 0.24 (H) 0.00 - 0.07 K/uL    Comment: Performed at [facility], [address] [place], [place] [address]  Comprehensive metabolic panel     Status: Abnormal   Collection Time: 11/26/18  1:05 AM  Result Value Ref Range   Sodium 136 135 - 145 mmol/L   Potassium 4.1 3.5 - 5.1 mmol/L   Chloride 97 (L) 98 - 111 mmol/L   CO2 27 22 - 32 mmol/L   Glucose, Bld 453 (H) 70 - 99 mg/dL   BUN 11 8 - 23 mg/dL   Creatinine, Ser 0.80 0.44 - 1.00 mg/dL   Calcium 8.9 8.9 - 10.3 mg/dL   Total Protein 7.6 6.5 - 8.1 g/dL   Albumin 3.1 (L) 3.5 - 5.0 g/dL   AST 16 15 - 41 U/L   ALT 15 0 - 44 U/L   Alkaline Phosphatase 116 38 - 126 U/L   Total Bilirubin 0.4 0.3 - 1.2 mg/dL   GFR calc non Af Amer >60 >60 mL/min   GFR calc Af Amer >60 >60 mL/min   Anion gap 12 5 - 15    Comment: Performed at [facility], [address] [place], [place] [address]  Urinalysis, Complete w Microscopic     Status: Abnormal   Collection Time: 11/26/18  1:05 AM  Result Value Ref Range   Color, Urine STRAW (A) YELLOW   APPearance CLEAR (A) CLEAR   Specific Gravity, Urine 1.026 1.005 - 1.030   pH 7.0 5.0 - 8.0   Glucose, UA >=500 (A) NEGATIVE mg/dL   Hgb urine dipstick NEGATIVE NEGATIVE   Bilirubin Urine NEGATIVE NEGATIVE   Ketones, ur 5 (A) NEGATIVE mg/dL   Protein, ur NEGATIVE NEGATIVE mg/dL   Nitrite NEGATIVE NEGATIVE   Leukocytes, UA NEGATIVE NEGATIVE   RBC / HPF 0-5 0 - 5 RBC/hpf    WBC, UA 0-5 0 - 5  WBC/hpf   Bacteria, UA NONE SEEN NONE SEEN   Squamous Epithelial / LPF NONE SEEN 0 - 5    Comment: Performed at [facility], [address] [place], [place] [address]  Troponin I - ONCE - STAT     Status: None   Collection Time: 11/26/18  1:05 AM  Result Value Ref Range   Troponin I <0.03 <0.03 ng/mL    Comment: Performed at [facility], [address] [place], [place] [address]  Glucose, capillary     Status: Abnormal   Collection Time: 11/26/18  1:10 AM  Result Value Ref Range   Glucose-Capillary 427 (H) 70 - 99 mg/dL   Dg Chest 2 View  Result Date: 11/26/2018 CLINICAL DATA:  Productive cough for 8 days.  Shortness of breath. EXAM: CHEST - 2 VIEW COMPARISON:  07/10/2018 FINDINGS: Normal heart size and pulmonary vascularity. No focal airspace disease or consolidation in the lungs. No blunting of costophrenic angles. No pneumothorax. Mediastinal contours appear intact. Calcification projected over the left lower chest corresponding to breast calcification on previous CT 10/24/2018. Surgical clips in the right upper quadrant. IMPRESSION: No active cardiopulmonary disease. Electronically Signed   By: Lucienne Capers M.D.   On: 11/26/2018 02:17    Review of Systems  Constitutional: Negative for chills and fever.  HENT: Negative for sore throat and tinnitus.   Eyes: Negative for blurred vision and redness.  Respiratory: Positive for cough, shortness of breath and wheezing.   Cardiovascular: Negative for chest pain, palpitations, orthopnea and PND.  Gastrointestinal: Negative for abdominal pain, diarrhea, nausea and vomiting.  Genitourinary: Negative for dysuria, frequency and urgency.  Musculoskeletal: Negative for joint pain and myalgias.  Skin: Negative for rash.       No lesions  Neurological: Negative for speech change, focal weakness and weakness.  Endo/Heme/Allergies: Does not bruise/bleed easily.       No temperature intolerance   Psychiatric/Behavioral: Negative for depression and suicidal ideas.    Blood pressure (!) 141/67, pulse 99, temperature 97.8 F (36.6 C), temperature source Oral, resp. rate 18, height 5\' 5"  (1.651 m), weight 89.8 kg, SpO2 96 %. Physical Exam  Vitals reviewed. Constitutional: She is oriented to person, place, and time. She appears well-developed and well-nourished.  HENT:  Head: Normocephalic and atraumatic.  Eyes: Pupils are equal, round, and reactive to light. EOM are normal.  Neck: Normal range of motion. No tracheal deviation present. No thyromegaly present.  Cardiovascular: Normal rate, regular rhythm and normal heart sounds. Exam reveals no gallop and no friction rub.  No murmur heard. Respiratory: Effort normal and breath sounds normal.  GI: Soft. Bowel sounds are normal. She exhibits no distension. There is no abdominal tenderness.  Lymphadenopathy:    She has no cervical adenopathy.  Neurological: She is alert and oriented to person, place, and time. No cranial nerve deficit. She exhibits normal muscle tone.  Skin: Skin is warm and dry.  Psychiatric: She has a normal mood and affect. Judgment and thought content normal.     Assessment/Plan This is a 63 year old female admitted for bronchitis. 1.  Bronchitis: Chest x-ray does not show infiltrate.  Continue breathing treatments.  Steroids if necessary.   2.  Hypertension: Uncontrolled; continue lisinopril and hydrochlorothiazide.  Labetalol as needed. 3.  CAD: Stable 4.  Diabetes mellitus type 2: Continue basal insulin therapy as well as sliding scale insulin. 5.  DVT prophylaxis: Lovenox 6.  GI prophylaxis: None The patient is a full code.  Time  spent on admission orders and patient care approximately 45 minutes  Harrie Foreman, MD 11/26/2018, 6:58 AM

## 2018-11-26 NOTE — Care Management Obs Status (Signed)
MEDICARE OBSERVATION STATUS NOTIFICATION   Patient Details  Name: Kristie Olson MRN: [id number] Date of Birth: 1956-10-26   Medicare Observation Status Notification Given:  Yes    Gaynor Genco A Raydon Chappuis, RN 11/26/2018, 11:14 AM

## 2018-11-26 NOTE — ED Notes (Signed)
Pt ambulating in room with oxygen saturation at 96% on room air. Pt unable to ambulate large distances, but pt states this is her baseline. Pt states she became slightly short of breath with ambulation. Pt assisted back to bed. Pt in NAD at this time. MD notified.

## 2018-11-26 NOTE — Progress Notes (Signed)
[place] [facility] [place] NAME: Kristie Olson    MR#:  [id number]  DATE OF BIRTH:  January 31, 1956  SUBJECTIVE:  CHIEF COMPLAINT:  Pt became tachycardic today and heart rate went up to 130s.  EKG has revealed sinus tachycardia.  Patient is hyperglycemic No family at bedside  REVIEW OF SYSTEMS:  CONSTITUTIONAL: No fever, fatigue or weakness.  EYES: No blurred or double vision.  EARS, NOSE, AND THROAT: No tinnitus or ear pain.  RESPIRATORY: Reports cough, shortness of breath, wheezing or hemoptysis.  CARDIOVASCULAR: No chest pain, orthopnea, edema.  Endorses palpitations which are resolved eventually GASTROINTESTINAL: No nausea, vomiting, diarrhea or abdominal pain.  GENITOURINARY: No dysuria, hematuria.  ENDOCRINE: No polyuria, nocturia,  HEMATOLOGY: No anemia, easy bruising or bleeding SKIN: No rash or lesion. MUSCULOSKELETAL: No joint pain or arthritis.   NEUROLOGIC: No tingling, numbness, weakness.  PSYCHIATRY: No anxiety or depression.   DRUG ALLERGIES:  No Known Allergies  VITALS:  Blood pressure 135/65, pulse 98, temperature 97.9 F (36.6 C), temperature source Oral, resp. rate 17, height 5' (1.524 m), weight 86.4 kg, SpO2 97 %.  PHYSICAL EXAMINATION:  GENERAL:  63 y.o.-year-old patient lying in the bed with no acute distress.  EYES: Pupils equal, round, reactive to light and accommodation. No scleral icterus. Extraocular muscles intact.  HEENT: Head atraumatic, normocephalic. Oropharynx and nasopharynx clear.  NECK:  Supple, no jugular venous distention. No thyroid enlargement, no tenderness.  LUNGS: Normal breath sounds bilaterally, no wheezing, rales,rhonchi or crepitation. No use of accessory muscles of respiration.  CARDIOVASCULAR: S1, S2 normal.  Tachycardic no murmurs, rubs, or gallops.  ABDOMEN: Soft, nontender, nondistended. Bowel sounds present.  EXTREMITIES: No pedal edema, cyanosis, or clubbing.  NEUROLOGIC: Awake,  alert and oriented x3 sensation intact. Gait not checked.  PSYCHIATRIC: The patient is alert and oriented x 3.  SKIN: No obvious rash, lesion, or ulcer.    LABORATORY PANEL:   CBC Recent Labs  Lab 11/26/18 0105  WBC 9.4  HGB 12.0  HCT 36.3  PLT 570*   ------------------------------------------------------------------------------------------------------------------  Chemistries  Recent Labs  Lab 11/26/18 0105  NA 136  K 4.1  CL 97*  CO2 27  GLUCOSE 453*  BUN 11  CREATININE 0.80  CALCIUM 8.9  AST 16  ALT 15  ALKPHOS 116  BILITOT 0.4   ------------------------------------------------------------------------------------------------------------------  Cardiac Enzymes Recent Labs  Lab 11/26/18 0105  TROPONINI <0.03   --------------------------------------------------------------------------------------------------------- twelve-lead EKG with sinus tachycardia  RADIOLOGY:  Dg Chest 2 View  Result Date: 11/26/2018 CLINICAL DATA:  Productive cough for 8 days.  Shortness of breath. EXAM: CHEST - 2 VIEW COMPARISON:  07/10/2018 FINDINGS: Normal heart size and pulmonary vascularity. No focal airspace disease or consolidation in the lungs. No blunting of costophrenic angles. No pneumothorax. Mediastinal contours appear intact. Calcification projected over the left lower chest corresponding to breast calcification on previous CT 10/24/2018. Surgical clips in the right upper quadrant. IMPRESSION: No active cardiopulmonary disease. Electronically Signed   By: Lucienne Capers M.D.   On: 11/26/2018 02:17    EKG:   Orders placed or performed during the hospital encounter of 11/26/18  . ED EKG  . ED EKG  . EKG 12-Lead  . EKG 12-Lead  . EKG 12-Lead  . EKG 12-Lead    ASSESSMENT AND PLAN:   This is a 63 year old female admitted for bronchitis.  1.  Acute Bronchitis:  Bronchodilator treatments, IV steroids, doxycycline and IV fluids  chest x-ray does not  show infiltrate.    Flu test negative.  Respiratory panel also negative 2.  Hypertension: Uncontrolled; continue lisinopril and hydrochlorothiazide.  Labetalol as needed. 3.  CAD: Stable 4.  Diabetes mellitus type 2: With hyperglycemia steroid-induced  continue basal insulin therapy as well as sliding scale insulin-resistant scale Extra dose of 10 units of Levemir was given.  Diabetic coordinator consult placed 5.  DVT prophylaxis: Lovenox 6.  GI prophylaxis: None     All the records are reviewed and case discussed with Care Management/Social Workerr. Management plans discussed with the patient, she is  in agreement.  CODE STATUS: fc  TOTAL CRITICAL CARE TIME TAKING CARE OF THIS PATIENT: 56minutes.   POSSIBLE D/C IN 2DAYS, DEPENDING ON CLINICAL CONDITION.  Note: This dictation was prepared with Dragon dictation along with smaller phrase technology. Any transcriptional errors that result from this process are unintentional.   Nicholes Mango M.D on 11/26/2018 at 6:51 PM  Between 7am to 6pm - Pager - [phone] After 6pm go to [email or web address] - password [email or web address] [facility]  [facility]  Office  [phone]  CC: Primary care physician; Elisabeth Cara, NP

## 2018-11-26 NOTE — ED Triage Notes (Signed)
Pt presents from home via acems with c/o Productive cough for last 8 days. Afebrile for EMS. EMS reports breath sounds diminshed in bilateral lower lobes. Took Tylenol at home. BS 407, not taking insulin at home due to poor apetite. 20G in [place]. Received 100cc fluid in route. Pt currently c/o generalized body aches.

## 2018-11-26 NOTE — Care Management Note (Signed)
Case Management Note  Patient Details  Name: Kristie Olson MRN: [id number] Date of Birth: 06-09-1956  Subjective/Objective:   Patient admitted to [facility] under observation status for acute bronichitis. RNCM consulted on patient to provide MOON letter and complete assessment. Patient lives at home with spouse and son. Patient is ordinarily independent with activities of daily living and able to drive. She has reported increasing shortness of breath on exertion and difficulty completing household task, she is hopeful this will resolve with her current medical management plan. Patient would need a nebulizer should she continue to require SVN treatments, also is currently on acute O2. PCP is at [facility] clinic. Patient uses multiple pharmacies she recently canceled her [employer] delivery sytem, now fills her scripts at [facility].                     Action/Plan: RNCM to continue to follow for any DME needs.  Expected Discharge Date:                  Expected Discharge Plan:     In-House Referral:     Discharge planning Services     Post Acute Care Choice:    Choice offered to:     DME Arranged:    DME Agency:     HH Arranged:    HH Agency:     Status of Service:     If discussed at [employer], dates discussed:    Additional Comments:  Latanya Maudlin, RN 11/26/2018, 11:15 AM

## 2018-11-26 NOTE — ED Notes (Signed)
Pt given saltines and peanut butter as well as water; call bell in reach

## 2018-11-26 NOTE — ED Provider Notes (Signed)
[facility] Emergency Department Provider Note    First MD Initiated Contact with Patient 11/26/18 0112     (approximate)  I have reviewed the triage vital signs and the nursing notes.   HISTORY  Chief Complaint Cough and Hyperglycemia    HPI Kristie Olson is a 63 y.o. female below listed past medical history presents with myalgias productive cough shortness of breath and malaise over the past 24 hours.  Has had several sick contacts at home.  No recent antibiotics.  Denies any nausea or vomiting.  States that she feels generalized weakness.  No chest pain.  No back pain.  No diarrhea.    Past Medical History:  Diagnosis Date  . Asthma   . Coronary artery disease   . Diabetes mellitus without complication ([place]) 6962  . Hypertension    Family History  Problem Relation Age of Onset  . Breast cancer Sister 67  . Breast cancer Paternal Aunt    Past Surgical History:  Procedure Laterality Date  . BREAST EXCISIONAL BIOPSY Left 1980's   neg  . BREAST MASS EXCISION Left    age 33"s, benign  . CESAREAN SECTION    . CHOLECYSTECTOMY  2009   Patient Active Problem List   Diagnosis Date Noted  . Breast abscess of female 03/29/2018  . Slurred speech 07/27/2017      Prior to Admission medications   Medication Sig Start Date End Date Taking? Authorizing Provider  acetaminophen (TYLENOL) 500 MG tablet Take 1,000 mg by mouth every 8 (eight) hours as needed for moderate pain.    [provider]  albuterol (PROVENTIL HFA;VENTOLIN HFA) 108 (90 Base) MCG/ACT inhaler Inhale 2 puffs into the lungs every 4 (four) hours as needed for wheezing or shortness of breath.    [provider]  amitriptyline (ELAVIL) 100 MG tablet Take 50 mg by mouth at bedtime as needed for sleep.    [provider]  aspirin 325 MG tablet Take 325 mg by mouth daily.    [provider]  aspirin EC 81 MG tablet Take 1 tablet (81 mg total) by mouth  daily. Patient not taking: Reported on 07/04/2018 07/28/17   Hillary Bow, MD  atorvastatin (LIPITOR) 10 MG tablet Take 1 tablet (10 mg total) by mouth daily at 6 PM. Patient not taking: Reported on 07/03/2018 07/28/17 07/28/18  Hillary Bow, MD  diphenhydrAMINE (BENADRYL) 25 MG tablet Take 25 mg by mouth 2 (two) times daily as needed for itching or allergies.    [provider]  FLUoxetine (PROZAC) 40 MG capsule Take 40 mg by mouth daily.    [provider]  gabapentin (NEURONTIN) 100 MG capsule Take 300 mg by mouth at bedtime.    [provider]  glipiZIDE (GLUCOTROL) 10 MG tablet Take 10 mg by mouth daily before breakfast.    [provider]  lisinopril-hydrochlorothiazide (PRINZIDE,ZESTORETIC) 20-25 MG tablet Take 1 tablet by mouth daily.    [provider]  omeprazole (PRILOSEC) 40 MG capsule Take 40 mg by mouth daily.    [provider]  traMADol (ULTRAM) 50 MG tablet Take 1 tablet (50 mg total) by mouth every 6 (six) hours as needed. 07/11/18 07/11/19  Gregor Hams, MD    Allergies Patient has no known allergies.    Social History Social History   Tobacco Use  . Smoking status: Current Every Day Smoker    Packs/day: 0.75    Years: 46.00    Pack  years: 34.50    Types: Cigarettes  . Smokeless tobacco: Never Used  Substance Use Topics  . Alcohol use: No  . Drug use: Not on file    Review of Systems Patient denies headaches, rhinorrhea, blurry vision, numbness, shortness of breath, chest pain, edema, cough, abdominal pain, nausea, vomiting, diarrhea, dysuria, fevers, rashes or hallucinations unless otherwise stated above in HPI. ____________________________________________   PHYSICAL EXAM:  VITAL SIGNS: Vitals:   11/26/18 0103  Temp: 98.7 F (37.1 C)    Constitutional: Alert and oriented.  Eyes: Conjunctivae are normal.  Head: Atraumatic. Nose: No congestion/rhinnorhea. Mouth/Throat: Mucous membranes are  moist.   Neck: No stridor. Painless ROM.  Cardiovascular: Normal rate, regular rhythm. Grossly normal heart sounds.  Good peripheral circulation. Respiratory: Normal respiratory effort.  No retractions. Lungs with coarse bibasilar BS Gastrointestinal: Soft and nontender. No distention. No abdominal bruits. No CVA tenderness. Genitourinary: deferred Musculoskeletal: No lower extremity tenderness nor edema.  No joint effusions. Neurologic:  Normal speech and language. No gross focal neurologic deficits are appreciated. No facial droop Skin:  Skin is warm, dry and intact. No rash noted. Psychiatric: Mood and affect are normal. Speech and behavior are normal.  ____________________________________________   LABS (all labs ordered are listed, but only abnormal results are displayed)  Results for orders placed or performed during the hospital encounter of 11/26/18 (from the past 24 hour(s))  CBC with Differential/Platelet     Status: Abnormal   Collection Time: 11/26/18  1:05 AM  Result Value Ref Range   WBC 9.4 4.0 - 10.5 K/uL   RBC 3.92 3.87 - 5.11 MIL/uL   Hemoglobin 12.0 12.0 - 15.0 g/dL   HCT 36.3 36.0 - 46.0 %   MCV 92.6 80.0 - 100.0 fL   MCH 30.6 26.0 - 34.0 pg   MCHC 33.1 30.0 - 36.0 g/dL   RDW 12.7 11.5 - 15.5 %   Platelets 570 (H) 150 - 400 K/uL   nRBC 0.0 0.0 - 0.2 %   Neutrophils Relative % 47 %   Neutro Abs 4.4 1.7 - 7.7 K/uL   Lymphocytes Relative 40 %   Lymphs Abs 3.7 0.7 - 4.0 K/uL   Monocytes Relative 10 %   Monocytes Absolute 0.9 0.1 - 1.0 K/uL   Eosinophils Relative 0 %   Eosinophils Absolute 0.0 0.0 - 0.5 K/uL   Basophils Relative 0 %   Basophils Absolute 0.0 0.0 - 0.1 K/uL   Immature Granulocytes 3 %   Abs Immature Granulocytes 0.24 (H) 0.00 - 0.07 K/uL  Glucose, capillary     Status: Abnormal   Collection Time: 11/26/18  1:10 AM  Result Value Ref Range   Glucose-Capillary 427 (H) 70 - 99 mg/dL   ____________________________________________  EKG My  review and personal interpretation at Time: 1:09   Indication:  cough  Rate: 95  Rhythm: sinus Axis: normal Other: rbbb otherwise normal intervals, no stemi ____________________________________________  RADIOLOGY  I personally reviewed all radiographic images ordered to evaluate for the above acute complaints and reviewed radiology reports and findings.  These findings were personally discussed with the patient.  Please see medical record for radiology report.____________________________________________   PROCEDURES  Procedure(s) performed:  Procedures    Critical Care performed: no ____________________________________________   INITIAL IMPRESSION / ASSESSMENT AND PLAN / ED COURSE  Pertinent labs & imaging results that were available during my care of the patient were reviewed by me and considered in my medical decision making (see chart for details).  DDX: Asthma, copd, CHF, pna, ptx, malignancy, Pe, anemia   Kristie Olson is a 63 y.o. who presents to the ED with cough weakness hyperglycemia as described above.  Patient currently afebrile.  Does have diminished breath sounds throughout with very weak voice.  Concerning for bronchospastic process.  Will give nebulizer.  Abdominal exam soft and benign.  Give fluids for hyperglycemia.  No evidence of ACS.  Will check for flu as well as order x-ray to evaluate for pneumonia.  The patient will be placed on continuous pulse oximetry and telemetry for monitoring.  Laboratory evaluation will be sent to evaluate for the above complaints.     Clinical Course as of Nov 26 424  Sun Nov 26, 2018  0301 Repeat examination after nebulizer shows diffuse rhonchorous coarse wheezes in anterior lung fields.  Will give additional nebulizer.  She is having productive sputum describing chills and fevers at home.  She is flu negative.  Will give doxycycline for acute bronchitis.   [PR]  0425 Patient reassessed.  Still with significant wheezing  tachypnea, tachycardia and shortness of breath.  Voice is a little bit stronger now with improved air movement after multiple nebulizers.  At this point he would patient will require hospitalization for further support.   [PR]    Clinical Course User Index [PR] Merlyn Lot, MD     As part of my medical decision making, I reviewed the following data within the [place] notes reviewed and incorporated, Labs reviewed, notes from prior ED visits.   ____________________________________________   FINAL CLINICAL IMPRESSION(S) / ED DIAGNOSES  Final diagnoses:  Bronchitis  Hyperglycemia      NEW MEDICATIONS STARTED DURING THIS VISIT:  New Prescriptions   No medications on file     Note:  This document was prepared using Dragon voice recognition software and may include unintentional dictation errors.    Merlyn Lot, MD 11/26/18 [phone]

## 2018-11-26 NOTE — ED Notes (Signed)
Pt given water to drink. MD aware. 

## 2018-11-27 LAB — CBC
HCT: 33.3 % — ABNORMAL LOW (ref 36.0–46.0)
Hemoglobin: 11 g/dL — ABNORMAL LOW (ref 12.0–15.0)
MCH: 30.7 pg (ref 26.0–34.0)
MCHC: 33 g/dL (ref 30.0–36.0)
MCV: 93 fL (ref 80.0–100.0)
Platelets: 618 10*3/uL — ABNORMAL HIGH (ref 150–400)
RBC: 3.58 MIL/uL — ABNORMAL LOW (ref 3.87–5.11)
RDW: 12.6 % (ref 11.5–15.5)
WBC: 18 10*3/uL — ABNORMAL HIGH (ref 4.0–10.5)
nRBC: 0.1 % (ref 0.0–0.2)

## 2018-11-27 LAB — GLUCOSE, CAPILLARY
Glucose-Capillary: 295 mg/dL — ABNORMAL HIGH (ref 70–99)
Glucose-Capillary: 372 mg/dL — ABNORMAL HIGH (ref 70–99)
Glucose-Capillary: 384 mg/dL — ABNORMAL HIGH (ref 70–99)
Glucose-Capillary: 301 mg/dL — ABNORMAL HIGH (ref 70–99)

## 2018-11-27 LAB — BASIC METABOLIC PANEL
Anion gap: 8 (ref 5–15)
BUN: 18 mg/dL (ref 8–23)
CO2: 27 mmol/L (ref 22–32)
Calcium: 8.9 mg/dL (ref 8.9–10.3)
Chloride: 100 mmol/L (ref 98–111)
Creatinine, Ser: 0.81 mg/dL (ref 0.44–1.00)
GFR calc Af Amer: >60 mL/min (ref >60)
GFR calc non Af Amer: >60 mL/min (ref >60)
Glucose, Bld: 354 mg/dL — ABNORMAL HIGH (ref 70–99)
Potassium: 4.4 mmol/L (ref 3.5–5.1)
Sodium: 135 mmol/L (ref 135–145)

## 2018-11-27 LAB — HEMOGLOBIN A1C
Hgb A1c MFr Bld: 9.3 % — ABNORMAL HIGH (ref 4.8–5.6)
Mean Plasma Glucose: 220.21 mg/dL

## 2018-11-27 MED ORDER — INSULIN GLARGINE 100 UNIT/ML ~~LOC~~ SOLN
55.0000 [IU] | Freq: Every day | SUBCUTANEOUS | Status: DC
  Administered 2018-11-27 – 2018-11-29 (×3): 55 [IU] via SUBCUTANEOUS
  Filled 2018-11-27 (×4): qty 0.55

## 2018-11-27 MED ORDER — INSULIN ASPART 100 UNIT/ML ~~LOC~~ SOLN
6.0000 [IU] | Freq: Three times a day (TID) | SUBCUTANEOUS | Status: DC
  Administered 2018-11-27 – 2018-11-28 (×4): 6 [IU] via SUBCUTANEOUS
  Filled 2018-11-27 (×4): qty 1

## 2018-11-27 MED ORDER — METHYLPREDNISOLONE SODIUM SUCC 40 MG IJ SOLR
40.0000 mg | Freq: Two times a day (BID) | INTRAMUSCULAR | Status: DC
  Administered 2018-11-27 – 2018-11-30 (×7): 40 mg via INTRAVENOUS
  Filled 2018-11-27 (×7): qty 1

## 2018-11-27 MED ORDER — INSULIN ASPART 100 UNIT/ML ~~LOC~~ SOLN
0.0000 [IU] | Freq: Every day | SUBCUTANEOUS | Status: DC
  Administered 2018-11-27: 4 [IU] via SUBCUTANEOUS
  Administered 2018-11-28: 3 [IU] via SUBCUTANEOUS
  Filled 2018-11-27 (×2): qty 1

## 2018-11-27 NOTE — Progress Notes (Signed)
[place] at [place] NAME: Kristie Olson    MR#:  [id number]  DATE OF BIRTH:  May 08, 1956  SUBJECTIVE:  CHIEF COMPLAINT:  Pt is resting comfortably.  Feeling better than yesterday still having some intermittent episodes of cough  REVIEW OF SYSTEMS:  CONSTITUTIONAL: No fever, fatigue or weakness.  EYES: No blurred or double vision.  EARS, NOSE, AND THROAT: No tinnitus or ear pain.  RESPIRATORY: Reports cough, shortness of breath, wheezing or hemoptysis.  CARDIOVASCULAR: No chest pain, orthopnea, edema.   GASTROINTESTINAL: No nausea, vomiting, diarrhea or abdominal pain.  GENITOURINARY: No dysuria, hematuria.  ENDOCRINE: No polyuria, nocturia,  HEMATOLOGY: No anemia, easy bruising or bleeding SKIN: No rash or lesion. MUSCULOSKELETAL: No joint pain or arthritis.   NEUROLOGIC: No tingling, numbness, weakness.  PSYCHIATRY: No anxiety or depression.   DRUG ALLERGIES:  No Known Allergies  VITALS:  Blood pressure (!) 146/73, pulse 89, temperature 97.8 F (36.6 C), temperature source Oral, resp. rate 19, height 5' (1.524 m), weight 90.3 kg, SpO2 98 %.  PHYSICAL EXAMINATION:  GENERAL:  63 y.o.-year-old patient lying in the bed with no acute distress.  EYES: Pupils equal, round, reactive to light and accommodation. No scleral icterus. Extraocular muscles intact.  HEENT: Head atraumatic, normocephalic. Oropharynx and nasopharynx clear.  NECK:  Supple, no jugular venous distention. No thyroid enlargement, no tenderness.  LUNGS: Normal breath sounds bilaterally, no wheezing, rales,rhonchi or crepitation. No use of accessory muscles of respiration.  CARDIOVASCULAR: S1, S2 normal.  Tachycardic no murmurs, rubs, or gallops.  ABDOMEN: Soft, nontender, nondistended. Bowel sounds present.  EXTREMITIES: No pedal edema, cyanosis, or clubbing.  NEUROLOGIC: Awake, alert and oriented x3 sensation intact. Gait not checked.  PSYCHIATRIC: The patient is  alert and oriented x 3.  SKIN: No obvious rash, lesion, or ulcer.    LABORATORY PANEL:   CBC Recent Labs  Lab 11/27/18 0339  WBC 18.0*  HGB 11.0*  HCT 33.3*  PLT 618*   ------------------------------------------------------------------------------------------------------------------  Chemistries  Recent Labs  Lab 11/26/18 0105 11/27/18 0339  NA 136 135  K 4.1 4.4  CL 97* 100  CO2 27 27  GLUCOSE 453* 354*  BUN 11 18  CREATININE 0.80 0.81  CALCIUM 8.9 8.9  AST 16  --   ALT 15  --   ALKPHOS 116  --   BILITOT 0.4  --    ------------------------------------------------------------------------------------------------------------------  Cardiac Enzymes Recent Labs  Lab 11/26/18 0105  TROPONINI <0.03   --------------------------------------------------------------------------------------------------------- twelve-lead EKG with sinus tachycardia  RADIOLOGY:  Dg Chest 2 View  Result Date: 11/26/2018 CLINICAL DATA:  Productive cough for 8 days.  Shortness of breath. EXAM: CHEST - 2 VIEW COMPARISON:  07/10/2018 FINDINGS: Normal heart size and pulmonary vascularity. No focal airspace disease or consolidation in the lungs. No blunting of costophrenic angles. No pneumothorax. Mediastinal contours appear intact. Calcification projected over the left lower chest corresponding to breast calcification on previous CT 10/24/2018. Surgical clips in the right upper quadrant. IMPRESSION: No active cardiopulmonary disease. Electronically Signed   By: Lucienne Capers M.D.   On: 11/26/2018 02:17    EKG:   Orders placed or performed during the hospital encounter of 11/26/18  . ED EKG  . ED EKG  . EKG 12-Lead  . EKG 12-Lead  . EKG 12-Lead  . EKG 12-Lead  . EKG    ASSESSMENT AND PLAN:   This is a 63 year old female admitted for bronchitis.  1.  Acute Bronchitis with COPD  exacerbation Clinically better still with exertional dyspnea Bronchodilator treatments, IV steroids,  doxycycline and IV fluids  chest x-ray does not show infiltrate.   Flu test negative.  Respiratory panel also negative 2.  Hypertension: Improving continue lisinopril and hydrochlorothiazide.  Labetalol as needed. 3.  CAD: Stable 4.  Diabetes mellitus type 2: With hyperglycemia steroid-induced  continue basal insulin therapy increase Lantus to 55 units as well as sliding scale insulin-resistant scale.  NovoLog 6 units 3 times daily with meal coverage Appreciate diabetic coordinator recommendations 5.    Tobacco abuse disorder counseled patient to quit smoking for 5 minutes.  She verbalized understanding.  Refused nicotine patch DVT prophylaxis: Lovenox 6.  GI prophylaxis: None     All the records are reviewed and case discussed with Care Management/Social Workerr. Management plans discussed with the patient, she is  in agreement.  CODE STATUS: fc  TOTAL CRITICAL CARE TIME TAKING CARE OF THIS PATIENT: 14minutes.   POSSIBLE D/C IN 1-2DAYS, DEPENDING ON CLINICAL CONDITION.  Note: This dictation was prepared with Dragon dictation along with smaller phrase technology. Any transcriptional errors that result from this process are unintentional.   Nicholes Mango M.D on 11/27/2018 at 2:00 PM  Between 7am to 6pm - Pager - [phone] After 6pm go to [email or web address] - password [email or web address] [facility]  [facility]  Office  [phone]  CC: Primary care physician; Elisabeth Cara, NP

## 2018-11-27 NOTE — Progress Notes (Signed)
Inpatient Diabetes Program Recommendations  AACE/ADA: New Consensus Statement on Inpatient Glycemic Control  Target Ranges:  Prepandial:   less than 140 mg/dL      Peak postprandial:   less than 180 mg/dL (1-2 hours)      Critically ill patients:  140 - 180 mg/dL   Results for EBONEE, STOBER (MRN [id number]) as of 11/27/2018 08:05  Ref. Range 11/26/2018 07:33 11/26/2018 11:50 11/26/2018 16:33 11/26/2018 21:42 11/27/2018 07:41  Glucose-Capillary Latest Ref Range: 70 - 99 mg/dL 427 (H) 436 (H) 414 (H) 372 (H) 295 (H)   Review of Glycemic Control  Diabetes history: DM2 Outpatient Diabetes medications: 70/30 75 units QAM, 70/30 35 units QPM, Glipizide 10 mg daily Current orders for Inpatient glycemic control: Lantus 50 units daily, Novolog 0-20 units TID with meals; Solumedrol 40 mg daily  Inpatient Diabetes Program Recommendations:  Insulin - Basal: Please consider increasing Lantus to 55 units daily. Correction (SSI): Please consider ordering Novolog 0-5 units QHS for bedtime correction. Insulin - Meal Coverage: Please consider ordering Novolog 6 units TID with meals for meal coverage if patient eats at least 50% of meals.  Thanks, Barnie Alderman, RN, MSN, CDE Diabetes Coordinator Inpatient Diabetes Program [phone] (Team Pager from 8am to 5pm)

## 2018-11-27 NOTE — Progress Notes (Signed)
SATURATION QUALIFICATIONS: (This note is used to comply with regulatory documentation for home oxygen)  Patient Saturations on Room Air at Rest = 98%  Patient Saturations on Room Air while Ambulating = 98%  Patient Saturations on 0 Liters of oxygen while Ambulating = 97%  Please briefly explain why patient needs home oxygen:

## 2018-11-28 LAB — GLUCOSE, CAPILLARY
Glucose-Capillary: 160 mg/dL — ABNORMAL HIGH (ref 70–99)
Glucose-Capillary: 276 mg/dL — ABNORMAL HIGH (ref 70–99)
Glucose-Capillary: 379 mg/dL — ABNORMAL HIGH (ref 70–99)
Glucose-Capillary: 279 mg/dL — ABNORMAL HIGH (ref 70–99)

## 2018-11-28 MED ORDER — INSULIN ASPART 100 UNIT/ML ~~LOC~~ SOLN
12.0000 [IU] | Freq: Three times a day (TID) | SUBCUTANEOUS | Status: DC
  Administered 2018-11-28 – 2018-11-30 (×6): 12 [IU] via SUBCUTANEOUS
  Filled 2018-11-28 (×7): qty 1

## 2018-11-28 NOTE — Progress Notes (Addendum)
Inpatient Diabetes Program Recommendations  AACE/ADA: New Consensus Statement on Inpatient Glycemic Control  Target Ranges:  Prepandial:   less than 140 mg/dL      Peak postprandial:   less than 180 mg/dL (1-2 hours)      Critically ill patients:  140 - 180 mg/dL   Results for Kristie Olson, Kristie Olson (MRN [id number]) as of 11/28/2018 08:41  Ref. Range 11/27/2018 07:41 11/27/2018 12:01 11/27/2018 16:25 11/27/2018 21:18 11/28/2018 07:41  Glucose-Capillary Latest Ref Range: 70 - 99 mg/dL 295 (H) 372 (H) 384 (H) 301 (H) 160 (H)   Review of Glycemic Control  Diabetes history: DM2 Outpatient Diabetes medications: 70/30 75 units QAM, 70/30 35 units QPM, Glipizide 10 mg daily Current orders for Inpatient glycemic control: Lantus 55 units daily, Novolog 0-20 units TID with meals, Novolog 0-5 units QHS, Novolog 6 units TID with meals for meal coverage; Solumedrol 40 mg Q12H  Inpatient Diabetes Program Recommendations:   Insulin - Meal Coverage: If steroids are continued as ordered, please consider increasing meal coverage to Novolog 12 units TID with meals for meal coverage if patient eats at least 50% of meals.  Addendum 11/28/18@12 :92- Spoke with patient about diabetes and home regimen for diabetes control. Patient reports that she is followed by PCP for diabetes management and currently she takes 70/30 75 units with breakfast, 70/30 35 units with supper, and Glipizide 10 mg daily as an outpatient for diabetes control. Patient reports that she is taking DM medications consistently but admits that she forgets to take the Glipizide sometimes. Patient states that over the past few days she has been feeling so badly, her family has been checking her glucose and she can't remember if she took her 70/30 or not.  Patient states that she checks her glucose 1-3 times per day and that it is "up and down; all over the place."  Discussed A1C results (9.3%% on 11/27/18) and explained that her current A1C indicates an average  glucose of 220 mg/dl over the past 2-3 months. Patient states that her current A1C is an improvement for her.  Discussed glucose and A1C goals. Discussed importance of checking CBGs and maintaining good CBG control to prevent long-term and short-term complications. Explained how hyperglycemia leads to damage within blood vessels which lead to the common complications seen with uncontrolled diabetes. Stressed to the patient the importance of improving glycemic control to prevent further complications from uncontrolled diabetes. Discussed impact of nutrition, exercise, stress, sickness, and medications on diabetes control. Patient admits that she eats candy, sweets, and whatever else she wants to eat.  Patient states that she does not eat candy and sweets everyday but she is not willing to change her food choices. Asked patient about talking with RD while inpatient and patient stated "I don't want to talk to anyone about my diet. I don't care what they say, I am going to eat what I want."  Encouraged patient to make dietary changes and perhaps not buy snacks and candy so that it won't be readily available at home. Discussed impact of steroids on current glycemic control.  Encouraged patient to check her glucose 3-4 times per day (before meals and at bedtime) and to keep a log book of glucose readings and insulin taken which she will need to take to doctor appointments. Patient verbalized understanding of information discussed and she states that she has no further questions at this time related to diabetes.  Thanks, Barnie Alderman, RN, MSN, CDE Diabetes Coordinator Inpatient Diabetes Program [phone] (Team  Pager from [place] to [place])

## 2018-11-28 NOTE — Progress Notes (Signed)
[place] at [place] NAME: Kristie Olson    MR#:  [id number]  DATE OF BIRTH:  07/03/1956  SUBJECTIVE:  CHIEF COMPLAINT:  Pt is resting comfortably.  Getting coughing spells and could not breathe during coughing spells.  Still continues to smoke 1 pack a day  REVIEW OF SYSTEMS:  CONSTITUTIONAL: No fever, fatigue or weakness.  EYES: No blurred or double vision.  EARS, NOSE, AND THROAT: No tinnitus or ear pain.  RESPIRATORY: Reports cough, shortness of breath, wheezing or hemoptysis.  CARDIOVASCULAR: No chest pain, orthopnea, edema.   GASTROINTESTINAL: No nausea, vomiting, diarrhea or abdominal pain.  GENITOURINARY: No dysuria, hematuria.  ENDOCRINE: No polyuria, nocturia,  HEMATOLOGY: No anemia, easy bruising or bleeding SKIN: No rash or lesion. MUSCULOSKELETAL: No joint pain or arthritis.   NEUROLOGIC: No tingling, numbness, weakness.  PSYCHIATRY: No anxiety or depression.   DRUG ALLERGIES:  No Known Allergies  VITALS:  Blood pressure (!) 145/59, pulse 93, temperature 98.6 F (37 C), temperature source Oral, resp. rate 18, height 5' (1.524 m), weight 88.1 kg, SpO2 96 %.  PHYSICAL EXAMINATION:  GENERAL:  63 y.o.-year-old patient lying in the bed with no acute distress.  EYES: Pupils equal, round, reactive to light and accommodation. No scleral icterus. Extraocular muscles intact.  HEENT: Head atraumatic, normocephalic. Oropharynx and nasopharynx clear.  NECK:  Supple, no jugular venous distention. No thyroid enlargement, no tenderness.  LUNGS: Coarse bronchial breath sounds with minimal diffuse bilateral  wheezing, no rales,rhonchi or crepitation. No use of accessory muscles of respiration.  CARDIOVASCULAR: S1, S2 normal.  Tachycardic no murmurs, rubs, or gallops.  ABDOMEN: Soft, nontender, nondistended. Bowel sounds present.  EXTREMITIES: No pedal edema, cyanosis, or clubbing.  NEUROLOGIC: Awake, alert and oriented x3  sensation intact. Gait not checked.  PSYCHIATRIC: The patient is alert and oriented x 3.  SKIN: No obvious rash, lesion, or ulcer.    LABORATORY PANEL:   CBC Recent Labs  Lab 11/27/18 0339  WBC 18.0*  HGB 11.0*  HCT 33.3*  PLT 618*   ------------------------------------------------------------------------------------------------------------------  Chemistries  Recent Labs  Lab 11/26/18 0105 11/27/18 0339  NA 136 135  K 4.1 4.4  CL 97* 100  CO2 27 27  GLUCOSE 453* 354*  BUN 11 18  CREATININE 0.80 0.81  CALCIUM 8.9 8.9  AST 16  --   ALT 15  --   ALKPHOS 116  --   BILITOT 0.4  --    ------------------------------------------------------------------------------------------------------------------  Cardiac Enzymes Recent Labs  Lab 11/26/18 0105  TROPONINI <0.03   --------------------------------------------------------------------------------------------------------- twelve-lead EKG with sinus tachycardia  RADIOLOGY:  No results found.  EKG:   Orders placed or performed during the hospital encounter of 11/26/18  . ED EKG  . ED EKG  . EKG 12-Lead  . EKG 12-Lead  . EKG 12-Lead  . EKG 12-Lead  . EKG    ASSESSMENT AND PLAN:   This is a 63 year old female admitted for bronchitis.  1.  Acute Bronchitis with COPD exacerbation Clinically better still with exertional dyspnea Bronchodilator treatments, IV steroids, doxycycline and IV fluids  chest x-ray does not show infiltrate.   Flu test negative.  Respiratory panel also negative 2.  Hypertension: Improving continue lisinopril and hydrochlorothiazide.  Labetalol as needed. 3.  CAD: Stable 4.  Diabetes mellitus type 2: With hyperglycemia steroid-induced  continue basal insulin therapy increase Lantus to 55 units as well as sliding scale insulin-resistant scale.  NovoLog 6 units 3 times daily with  meal coverage Appreciate diabetic coordinator recommendations 5.    Tobacco abuse disorder counseled patient to  quit smoking for 5 minutes.  She verbalized understanding.  Refused nicotine patch DVT prophylaxis: Lovenox 6.  GI prophylaxis: None     All the records are reviewed and case discussed with Care Management/Social Workerr. Management plans discussed with the patient, she is  in agreement.  CODE STATUS: fc  TOTAL TIME TAKING CARE OF THIS PATIENT:25minutes.   POSSIBLE D/C IN 1-2DAYS, DEPENDING ON CLINICAL CONDITION.  Note: This dictation was prepared with Dragon dictation along with smaller phrase technology. Any transcriptional errors that result from this process are unintentional.   Nicholes Mango M.D on 11/28/2018 at 3:48 PM  Between 7am to 6pm - Pager - [phone] After 6pm go to [email or web address] - password [email or web address] [facility]  [place] [facility]  Office  [phone]  CC: Primary care physician; Elisabeth Cara, NP

## 2018-11-29 LAB — GLUCOSE, CAPILLARY
Glucose-Capillary: 419 mg/dL — ABNORMAL HIGH (ref 70–99)
Glucose-Capillary: 322 mg/dL — ABNORMAL HIGH (ref 70–99)
Glucose-Capillary: 314 mg/dL — ABNORMAL HIGH (ref 70–99)
Glucose-Capillary: 163 mg/dL — ABNORMAL HIGH (ref 70–99)

## 2018-11-29 MED ORDER — GABAPENTIN 300 MG PO CAPS
300.0000 mg | ORAL_CAPSULE | Freq: Every evening | ORAL | Status: DC
  Administered 2018-11-29: 300 mg via ORAL
  Filled 2018-11-29: qty 1

## 2018-11-29 MED ORDER — METOPROLOL TARTRATE 25 MG PO TABS
25.0000 mg | ORAL_TABLET | Freq: Two times a day (BID) | ORAL | Status: DC
  Administered 2018-11-29 – 2018-11-30 (×3): 25 mg via ORAL
  Filled 2018-11-29 (×3): qty 1

## 2018-11-29 MED ORDER — ACETYLCYSTEINE 20 % IN SOLN
4.0000 mL | Freq: Two times a day (BID) | RESPIRATORY_TRACT | Status: DC
  Administered 2018-11-29 – 2018-11-30 (×2): 4 mL via ORAL
  Filled 2018-11-29 (×4): qty 4

## 2018-11-29 MED ORDER — GUAIFENESIN-CODEINE 100-10 MG/5ML PO SOLN
10.0000 mL | ORAL | Status: DC | PRN
  Administered 2018-11-29 – 2018-11-30 (×2): 10 mL via ORAL
  Filled 2018-11-29 (×3): qty 10

## 2018-11-29 MED ORDER — BUDESONIDE 0.25 MG/2ML IN SUSP
0.2500 mg | Freq: Two times a day (BID) | RESPIRATORY_TRACT | Status: DC
  Administered 2018-11-29 – 2018-11-30 (×2): 0.25 mg via RESPIRATORY_TRACT
  Filled 2018-11-29 (×2): qty 2

## 2018-11-29 MED ORDER — FLUOXETINE HCL 20 MG PO CAPS: 20.0000 mg | ORAL_CAPSULE | Freq: Every evening | ORAL | Status: DC

## 2018-11-29 MED ORDER — INSULIN GLARGINE 100 UNIT/ML ~~LOC~~ SOLN
60.0000 [IU] | Freq: Every day | SUBCUTANEOUS | Status: DC
  Administered 2018-11-30: 60 [IU] via SUBCUTANEOUS
  Filled 2018-11-29 (×2): qty 0.6

## 2018-11-29 MED ORDER — KETOTIFEN FUMARATE 0.025 % OP SOLN
1.0000 [drp] | Freq: Two times a day (BID) | OPHTHALMIC | Status: DC
  Administered 2018-11-29 – 2018-11-30 (×3): 1 [drp] via OPHTHALMIC
  Filled 2018-11-29: qty 5

## 2018-11-29 MED ORDER — TRAMADOL HCL 50 MG PO TABS
50.0000 mg | ORAL_TABLET | Freq: Four times a day (QID) | ORAL | Status: DC | PRN
  Administered 2018-11-29 – 2018-11-30 (×3): 50 mg via ORAL
  Filled 2018-11-29 (×3): qty 1

## 2018-11-29 MED ORDER — ALUM & MAG HYDROXIDE-SIMETH 200-200-20 MG/5ML PO SUSP
30.0000 mL | ORAL | Status: DC | PRN
  Filled 2018-11-29 (×2): qty 30

## 2018-11-29 MED ORDER — GUAIFENESIN ER 600 MG PO TB12
600.0000 mg | ORAL_TABLET | Freq: Two times a day (BID) | ORAL | Status: DC
  Administered 2018-11-29 – 2018-11-30 (×3): 600 mg via ORAL
  Filled 2018-11-29 (×3): qty 1

## 2018-11-29 MED ORDER — INSULIN GLARGINE 100 UNIT/ML ~~LOC~~ SOLN
5.0000 [IU] | Freq: Once | SUBCUTANEOUS | Status: AC
  Administered 2018-11-29: 5 [IU] via SUBCUTANEOUS
  Filled 2018-11-29: qty 0.05

## 2018-11-29 MED ORDER — DIPHENHYDRAMINE HCL 25 MG PO TABS
25.0000 mg | ORAL_TABLET | Freq: Two times a day (BID) | ORAL | Status: DC | PRN
  Filled 2018-11-29: qty 1

## 2018-11-29 MED ORDER — FLUOXETINE HCL 20 MG PO CAPS
60.0000 mg | ORAL_CAPSULE | Freq: Every day | ORAL | Status: DC
  Administered 2018-11-29: 60 mg via ORAL
  Filled 2018-11-29 (×2): qty 3

## 2018-11-29 MED ORDER — PANTOPRAZOLE SODIUM 40 MG PO TBEC
40.0000 mg | DELAYED_RELEASE_TABLET | Freq: Every day | ORAL | Status: DC
  Administered 2018-11-29 – 2018-11-30 (×2): 40 mg via ORAL
  Filled 2018-11-29 (×2): qty 1

## 2018-11-29 MED ORDER — ASPIRIN EC 325 MG PO TBEC
325.0000 mg | DELAYED_RELEASE_TABLET | Freq: Every day | ORAL | Status: DC
  Administered 2018-11-29 – 2018-11-30 (×2): 325 mg via ORAL
  Filled 2018-11-29 (×2): qty 1

## 2018-11-29 MED ORDER — AMITRIPTYLINE HCL 50 MG PO TABS
50.0000 mg | ORAL_TABLET | Freq: Every evening | ORAL | Status: DC | PRN
  Filled 2018-11-29: qty 1

## 2018-11-29 NOTE — Progress Notes (Signed)
Inpatient Diabetes Program Recommendations  AACE/ADA: New Consensus Statement on Inpatient Glycemic Control  Target Ranges:  Prepandial:   less than 140 mg/dL      Peak postprandial:   less than 180 mg/dL (1-2 hours)      Critically ill patients:  140 - 180 mg/dL   Results for Kristie Olson, Kristie Olson (MRN [id number]) as of 11/29/2018 07:39  Ref. Range 11/28/2018 07:41 11/28/2018 11:34 11/28/2018 16:53 11/28/2018 20:46 11/29/2018 07:36  Glucose-Capillary Latest Ref Range: 70 - 99 mg/dL 160 (H) 276 (H) 379 (H) 279 (H) 419 (H)   Review of Glycemic Control  Diabetes history:DM2 Outpatient Diabetes medications:70/30 75 units QAM, 70/30 35 units QPM, Glipizide 10 mg daily Current orders for Inpatient glycemic control:Lantus 55 units daily, Novolog 0-20 units TID with meals, Novolog 0-5 units QHS, Novolog 12 units TID with meals for meal coverage; Solumedrol 40 mg Q12H  Inpatient Diabetes Program Recommendations: Insulin-Basal: Please consider increasing Lantus to 60 units daily.  Insulin - Meal Coverage: If steroids are continued as ordered, please consider increasing meal coverage to Novolog 20 units TID with meals for meal coverage if patient eats at least 50% of meals. Please start this morning with breakfast.  Thanks, Barnie Alderman, RN, MSN, CDE Diabetes Coordinator Inpatient Diabetes Program [phone] (Team Pager from 8am to 5pm)

## 2018-11-29 NOTE — Evaluation (Signed)
Occupational Therapy Evaluation Patient Details Name: Kristie Olson MRN: [id number] DOB: Jun 22, 1956 Today's Date: 11/29/2018    History of Present Illness 63yo female pt admitted on 1/12 for bronchitis. PMHx includes current smoker, COPD, DMII w/ neuropathy, HTN, and CAD.   Clinical Impression   Pt seen for OT evaluation this date. Prior to hospital admission, pt was modified independent with mobility (more recently using rollator) and ADL (seated shower) with increasing difficulty performing cooking, cleaning, and other tasks 2/2 fatigue and SOB.  Pt lives with her spouse in a 1 story home with 7 steps to enter. Pt endorses decreased assist/support from spouse at home and pt "has to do everything." Currently pt demonstrates impairments in activity tolerance, pain (recent R shoulder injury, bilat hand/foot neuropathy), BUE/BLE strength, and balance requiring supervision to PRN MIN assist for LB ADL and CGA with RW for mobility. Spouse in room at end of session. Per RN, pt ambulated with RW around the nursing unit prior to OT evaluation without significant difficulty, HR stable. HR during evaluation 77-78bpm. Pt would benefit from skilled OT to address noted impairments and functional limitations (see below for any additional details) with emphasis on energy conservation strategies and home/routines modifications in order to maximize safety and independence while minimizing falls risk and caregiver burden.  Upon hospital discharge, recommend pt discharge to home with [place].    Follow Up Recommendations  Home health OT    Equipment Recommendations  None recommended by OT    Recommendations for Other Services       Precautions / Restrictions Precautions Precautions: Fall Restrictions Weight Bearing Restrictions: No      Mobility Bed Mobility Overal bed mobility: Modified Independent                Transfers Overall transfer level: Needs assistance Equipment used: Rolling  walker (2 wheeled) Transfers: Sit to/from Stand Sit to Stand: Min guard              Balance Overall balance assessment: Mild deficits observed, not formally tested                                         ADL either performed or assessed with clinical judgement   ADL Overall ADL's : Needs assistance/impaired                                       General ADL Comments: supervision for LB ADL, CGA for amb w/ RW     Vision Baseline Vision/History: Wears glasses Wears Glasses: Reading only Patient Visual Report: No change from baseline       Perception     Praxis      Pertinent Vitals/Pain Pain Assessment: 0-10 Pain Score: 1  Pain Location: B hands, B feet (chronic neuropathy) Pain Descriptors / Indicators: Aching Pain Intervention(s): Limited activity within patient's tolerance;Monitored during session     Hand Dominance Right   Extremity/Trunk Assessment Upper Extremity Assessment Upper Extremity Assessment: Generalized weakness(hx R shoulder injury, B shoulders grossly 3+/5, otherwise 4-/5)   Lower Extremity Assessment Lower Extremity Assessment: Generalized weakness   Cervical / Trunk Assessment Cervical / Trunk Assessment: Normal   Communication Communication Communication: No difficulties   Cognition Arousal/Alertness: Awake/alert Behavior During Therapy: WFL for tasks assessed/performed Overall Cognitive Status: Within Functional Limits for tasks  assessed                                     General Comments       Exercises Other Exercises Other Exercises: pt educated in basic principles of energy conservation - will benefit from additional training and handout next session   Shoulder Instructions      [place] expects to be discharged to:: Private residence Living Arrangements: Spouse/significant other;Children Available Help at Discharge: Family(per pt, spouse not very  supportive) Type of Home: House Home Access: Stairs to enter [employer] of Steps: 7 Entrance Stairs-Rails: Right;Left;Can reach both Home Layout: One level     Bathroom Shower/Tub: Teacher, early years/pre: Standard(BSC over top)     Home Equipment: Bedside commode;Walker - 2 wheels;Walker - 4 wheels;Cane - single point;Shower seat;Hand held shower head          Prior Functioning/Environment Level of Independence: Independent with assistive device(s)        Comments: Mod I for ADL (seated shower), tends to "furniture cruise", has a SPC in every room. More recently has been using her rollator for mobility 2/2 increasing fatigue. 2 falls in past 12 months 2/2 turning too quickly and LOB.         OT Problem List: Decreased strength;Decreased knowledge of use of DME or AE;Decreased activity tolerance;Cardiopulmonary status limiting activity;Impaired UE functional use;Pain;Impaired sensation;Impaired balance (sitting and/or standing)      OT Treatment/Interventions: Self-care/ADL training;Balance training;Therapeutic exercise;Therapeutic activities;Energy conservation;DME and/or AE instruction;Patient/family education    OT Goals(Current goals can be found in the care plan section) Acute Rehab OT Goals Patient Stated Goal: to feel better and have more energy OT Goal Formulation: With patient Time For Goal Achievement: 12/13/18 Potential to Achieve Goals: Good ADL Goals Additional ADL Goal #1: Pt will utilize pursed lip breathing and incorporate rest breaks into functional ADL and mobility tasks independently to minimize SOB and support energy conservation. Additional ADL Goal #2: Pt will verbalize plan to implement at least 2 learned ECS to support safety and independence with ADL and IADL at home.  OT Frequency: Min 1X/week   Barriers to D/C: Decreased caregiver support          Co-evaluation              AM-PAC OT "6 Clicks" Daily Activity      Outcome Measure Help from another person eating meals?: None Help from another person taking care of personal grooming?: None Help from another person toileting, which includes using toliet, bedpan, or urinal?: A Little Help from another person bathing (including washing, rinsing, drying)?: A Little Help from another person to put on and taking off regular upper body clothing?: None Help from another person to put on and taking off regular lower body clothing?: A Little 6 Click Score: 21   End of Session    Activity Tolerance: Patient tolerated treatment well Patient left: in bed;with call bell/phone within reach;with family/visitor present  OT Visit Diagnosis: Repeated falls (R29.6);Muscle weakness (generalized) (M62.81);Pain Pain - Right/Left: Right(and L) Pain - part of body: Shoulder;Hand;Ankle and joints of foot                Time: 1324-4010 OT Time Calculation (min): 23 min Charges:  OT General Charges $OT Visit: 1 Visit OT Evaluation $OT Eval Moderate Complexity: 1 [place], MPH, MS, OTR/L ascom [phone] 11/29/18,  5:04 PM

## 2018-11-29 NOTE — Progress Notes (Signed)
MD messaged: Blood sugars have been elevated since yesterday. This morning it is 419. 32 units of insulin to be given. Patient is currently on prednisone.   MD messaged back and indicates he will see patient.

## 2018-11-29 NOTE — Progress Notes (Signed)
[facility] [place] NAME: Kristie Olson    MR#:  [id number]  DATE OF BIRTH:  [birth year]  SUBJECTIVE:  CHIEF COMPLAINT: Patient continues to get short of breath with activity and heart rate increases continues to complain of cough  REVIEW OF SYSTEMS:  CONSTITUTIONAL: No fever, fatigue or weakness.  EYES: No blurred or double vision.  EARS, NOSE, AND THROAT: No tinnitus or ear pain.  RESPIRATORY: Reports cough, shortness of breath, wheezing or hemoptysis.  CARDIOVASCULAR: No chest pain, orthopnea, edema.   GASTROINTESTINAL: No nausea, vomiting, diarrhea or abdominal pain.  GENITOURINARY: No dysuria, hematuria.  ENDOCRINE: No polyuria, nocturia,  HEMATOLOGY: No anemia, easy bruising or bleeding SKIN: No rash or lesion. MUSCULOSKELETAL: No joint pain or arthritis.   NEUROLOGIC: No tingling, numbness, weakness.  PSYCHIATRY: No anxiety or depression.   DRUG ALLERGIES:  No Known Allergies  VITALS:  Blood pressure 119/66, pulse 78, temperature 97.8 F (36.6 C), temperature source Oral, resp. rate 18, height 5' (1.524 m), weight 88.1 kg, SpO2 96 %.  PHYSICAL EXAMINATION:  GENERAL:  63 y.o.-year-old patient lying in the bed with no acute distress.  EYES: Pupils equal, round, reactive to light and accommodation. No scleral icterus. Extraocular muscles intact.  HEENT: Head atraumatic, normocephalic. Oropharynx and nasopharynx clear.  NECK:  Supple, no jugular venous distention. No thyroid enlargement, no tenderness.  LUNGS: Coarse bronchial breath sounds with minimal diffuse bilateral  wheezing, no rales,rhonchi or crepitation. No use of accessory muscles of respiration.  CARDIOVASCULAR: S1, S2 normal.  Tachycardic no murmurs, rubs, or gallops.  ABDOMEN: Soft, nontender, nondistended. Bowel sounds present.  EXTREMITIES: No pedal edema, cyanosis, or clubbing.  NEUROLOGIC: Awake, alert and oriented x3 sensation intact. Gait not checked.   PSYCHIATRIC: The patient is alert and oriented x 3.  SKIN: No obvious rash, lesion, or ulcer.    LABORATORY PANEL:   CBC Recent Labs  Lab 11/27/18 0339  WBC 18.0*  HGB 11.0*  HCT 33.3*  PLT 618*   ------------------------------------------------------------------------------------------------------------------  Chemistries  Recent Labs  Lab 11/26/18 0105 11/27/18 0339  NA 136 135  K 4.1 4.4  CL 97* 100  CO2 27 27  GLUCOSE 453* 354*  BUN 11 18  CREATININE 0.80 0.81  CALCIUM 8.9 8.9  AST 16  --   ALT 15  --   ALKPHOS 116  --   BILITOT 0.4  --    ------------------------------------------------------------------------------------------------------------------  Cardiac Enzymes Recent Labs  Lab 11/26/18 0105  TROPONINI <0.03   --------------------------------------------------------------------------------------------------------- twelve-lead EKG with sinus tachycardia  RADIOLOGY:  No results found.  EKG:   Orders placed or performed during the hospital encounter of 11/26/18  . ED EKG  . ED EKG  . EKG 12-Lead  . EKG 12-Lead  . EKG 12-Lead  . EKG 12-Lead  . EKG    ASSESSMENT AND PLAN:   This is a 63 year old female admitted for bronchitis.  1.  Acute Bronchitis with COPD exacerbation I will add Mucomyst to current therapy, add Mucinex to current therapy chest x-ray does not show infiltrate.   Flu test negative.  Respiratory panel also negative Change codeine-containing cough syrup 2.  Hypertension:  continue lisinopril and hydrochlorothiazide.  Labetalol as needed. 3.  CAD: Stable 4.  Diabetes mellitus type 2: With hyperglycemia steroid-induced  Lantus has been increased Appreciate diabetic coordinator recommendations 5.    Tobacco abuse disorder counseled patient to quit smoking for 5 minutes.  She verbalized understanding.  Refused nicotine patch  DVT prophylaxis: Lovenox 6.  GI prophylaxis: None     All the records are reviewed and case  discussed with Care Management/Social Workerr. Management plans discussed with the patient, she is  in agreement.  CODE STATUS: fc  TOTAL TIME TAKING CARE OF THIS PATIENT:97minutes.   POSSIBLE D/C IN 1-2DAYS, DEPENDING ON CLINICAL CONDITION.  Note: This dictation was prepared with Dragon dictation along with smaller phrase technology. Any transcriptional errors that result from this process are unintentional.   Dustin Flock M.D on 11/29/2018 at 4:58 PM  Between 7am to 6pm - Pager - [phone] After 6pm go to [email or web address] - password [email or web address] [facility]  [facility]  Office  [phone]  CC: Primary care physician; Elisabeth Cara, NP

## 2018-11-29 NOTE — Progress Notes (Signed)
Patient started to get tearful at shift change. She had spoke to doctor Kristie Olson about feeling depress at home.

## 2018-11-29 NOTE — Plan of Care (Signed)
The patient has been complaining of continuing to have a dry cough. Robitussin provided. Blood glucose was >400 this AM. MD was notified and medication was adjusted.  Blood sugar improving slowly. Heart rate >200bpm when getting out of bed. MD has adjusted medications. PT/OT order placed. Breathing treatments provided as schedule.  Problem: Education: Goal: Knowledge of General Education information will improve Description Including pain rating scale, medication(s)/side effects and non-pharmacologic comfort measures Outcome: Progressing   Problem: Health Behavior/Discharge Planning: Goal: Ability to manage health-related needs will improve Outcome: Progressing   Problem: Clinical Measurements: Goal: Ability to maintain clinical measurements within normal limits will improve Outcome: Progressing Goal: Will remain free from infection Outcome: Progressing Goal: Diagnostic test results will improve Outcome: Progressing Goal: Respiratory complications will improve Outcome: Progressing Goal: Cardiovascular complication will be avoided Outcome: Progressing   Problem: Activity: Goal: Risk for activity intolerance will decrease Outcome: Progressing   Problem: Nutrition: Goal: Adequate nutrition will be maintained Outcome: Progressing   Problem: Coping: Goal: Level of anxiety will decrease Outcome: Progressing   Problem: Elimination: Goal: Will not experience complications related to bowel motility Outcome: Progressing Goal: Will not experience complications related to urinary retention Outcome: Progressing   Problem: Pain Managment: Goal: General experience of comfort will improve Outcome: Progressing   Problem: Safety: Goal: Ability to remain free from injury will improve Outcome: Progressing   Problem: Skin Integrity: Goal: Risk for impaired skin integrity will decrease Outcome: Progressing

## 2018-11-30 LAB — GLUCOSE, CAPILLARY
Glucose-Capillary: 437 mg/dL — ABNORMAL HIGH (ref 70–99)
Glucose-Capillary: 273 mg/dL — ABNORMAL HIGH (ref 70–99)

## 2018-11-30 MED ORDER — DOXYCYCLINE HYCLATE 100 MG PO TABS: 100.0000 mg | ORAL_TABLET | Freq: Two times a day (BID) | ORAL | 0 refills | Status: AC

## 2018-11-30 MED ORDER — GUAIFENESIN ER 600 MG PO TB12: 600.0000 mg | ORAL_TABLET | Freq: Two times a day (BID) | ORAL | 0 refills | Status: AC

## 2018-11-30 MED ORDER — IPRATROPIUM-ALBUTEROL 0.5-2.5 (3) MG/3ML IN SOLN: 3.0000 mL | Freq: Three times a day (TID) | RESPIRATORY_TRACT | 2 refills | Status: DC

## 2018-11-30 MED ORDER — METOPROLOL TARTRATE 25 MG PO TABS: 25.0000 mg | ORAL_TABLET | Freq: Two times a day (BID) | ORAL | 0 refills | Status: DC

## 2018-11-30 MED ORDER — GUAIFENESIN-DM 100-10 MG/5ML PO SYRP: 15.0000 mL | ORAL_SOLUTION | ORAL | 0 refills | Status: DC | PRN

## 2018-11-30 MED ORDER — PREDNISONE 50 MG PO TABS: 50.0000 mg | ORAL_TABLET | Freq: Every day | ORAL | Status: DC

## 2018-11-30 MED ORDER — PREDNISONE 10 MG (21) PO TBPK: ORAL_TABLET | ORAL | 0 refills | Status: DC

## 2018-11-30 NOTE — Progress Notes (Signed)
MD messaged: Patient's blood sugar is 437 this Mornign. 32 units of insulin will be given this mornign.

## 2018-11-30 NOTE — Discharge Summary (Signed)
Kristie Olson at [facility] D [place], 63 y.o., DOB March 06, 1956, MRN [id number]. Admission date: 11/26/2018 Discharge Date 11/30/2018 Primary MD Elisabeth Cara, NP Admitting Physician Harrie Foreman, MD  Admission Diagnosis  Bronchitis [J40] Hyperglycemia [R73.9]  Discharge Diagnosis   Active Problems: Acute bronchitis with COPD exasperation Hypertension Coronary artery disease Diabetes type 2 Tobacco abuse Depression       Hospital Course   patient with past medical history of hypertension, diabetes and coronary artery disease presents to the emergency department due to cough.  The cough is productive but patient denies fever.  She admits to general malaise as well as some shortness of breath.  The patient received breathing treatments in the emergency department but remained wheezing.  Thought to have acute bronchitis and COPD exasperation she was admitted and treated with nebs steroids with significant improvement in her symptoms.  Blood sugars were elevated due to steroids. Patient doing much better stable for discharge to home.           Consults  None  Significant Tests:  See full reports for all details     Dg Chest 2 View  Result Date: 11/26/2018 CLINICAL DATA:  Productive cough for 8 days.  Shortness of breath. EXAM: CHEST - 2 VIEW COMPARISON:  07/10/2018 FINDINGS: Normal heart size and pulmonary vascularity. No focal airspace disease or consolidation in the lungs. No blunting of costophrenic angles. No pneumothorax. Mediastinal contours appear intact. Calcification projected over the left lower chest corresponding to breast calcification on previous CT 10/24/2018. Surgical clips in the right upper quadrant. IMPRESSION: No active cardiopulmonary disease. Electronically Signed   By: Lucienne Capers M.D.   On: 11/26/2018 02:17       Today   Subjective:   Kristie Olson feeling better shortness of breath improved Objective:    Blood pressure 124/63, pulse 74, temperature 98.1 F (36.7 C), temperature source Oral, resp. rate 17, height 5' (1.524 m), weight 88.1 kg, SpO2 98 %.  .  Intake/Output Summary (Last 24 hours) at 11/30/2018 1534 Last data filed at 11/29/2018 1800 Gross per 24 hour  Intake 200 ml  Output -  Net 200 ml    Exam VITAL SIGNS: Blood pressure 124/63, pulse 74, temperature 98.1 F (36.7 C), temperature source Oral, resp. rate 17, height 5' (1.524 m), weight 88.1 kg, SpO2 98 %.  GENERAL:  63 y.o.-year-old patient lying in the bed with no acute distress.  EYES: Pupils equal, round, reactive to light and accommodation. No scleral icterus. Extraocular muscles intact.  HEENT: Head atraumatic, normocephalic. Oropharynx and nasopharynx clear.  NECK:  Supple, no jugular venous distention. No thyroid enlargement, no tenderness.  LUNGS: Normal breath sounds bilaterally, no wheezing, rales,rhonchi or crepitation. No use of accessory muscles of respiration.  CARDIOVASCULAR: S1, S2 normal. No murmurs, rubs, or gallops.  ABDOMEN: Soft, nontender, nondistended. Bowel sounds present. No organomegaly or mass.  EXTREMITIES: No pedal edema, cyanosis, or clubbing.  NEUROLOGIC: Cranial nerves II through XII are intact. Muscle strength 5/5 in all extremities. Sensation intact. Gait not checked.  PSYCHIATRIC: The patient is alert and oriented x 3.  SKIN: No obvious rash, lesion, or ulcer.   Data Review     CBC w Diff:  Lab Results  Component Value Date   WBC 18.0 (H) 11/27/2018   HGB 11.0 (L) 11/27/2018   HGB 14.7 07/14/2013   HCT 33.3 (L) 11/27/2018   HCT 42.5 07/14/2013   PLT 618 (H) 11/27/2018   PLT  361 07/14/2013   LYMPHOPCT 40 11/26/2018   MONOPCT 10 11/26/2018   EOSPCT 0 11/26/2018   BASOPCT 0 11/26/2018   CMP:  Lab Results  Component Value Date   NA 135 11/27/2018   NA 132 (L) 07/14/2013   K 4.4 11/27/2018   K 4.3 07/14/2013   CL 100 11/27/2018   CL 95 (L) 07/14/2013   CO2 27  11/27/2018   CO2 31 07/14/2013   BUN 18 11/27/2018   BUN 12 07/14/2013   CREATININE 0.81 11/27/2018   CREATININE 0.91 07/14/2013   PROT 7.6 11/26/2018   PROT 7.9 07/14/2013   ALBUMIN 3.1 (L) 11/26/2018   ALBUMIN 3.7 07/14/2013   BILITOT 0.4 11/26/2018   BILITOT 0.3 07/14/2013   ALKPHOS 116 11/26/2018   ALKPHOS 118 07/14/2013   AST 16 11/26/2018   AST 16 07/14/2013   ALT 15 11/26/2018   ALT 20 07/14/2013  .  Micro Results Recent Results (from the past 240 hour(s))  Respiratory Panel by PCR     Status: None   Collection Time: 11/26/18 12:38 PM  Result Value Ref Range Status   Adenovirus NOT DETECTED NOT DETECTED Final   Coronavirus 229E NOT DETECTED NOT DETECTED Final   Coronavirus HKU1 NOT DETECTED NOT DETECTED Final   Coronavirus NL63 NOT DETECTED NOT DETECTED Final   Coronavirus OC43 NOT DETECTED NOT DETECTED Final   Metapneumovirus NOT DETECTED NOT DETECTED Final   Rhinovirus / Enterovirus NOT DETECTED NOT DETECTED Final   Influenza A NOT DETECTED NOT DETECTED Final   Influenza B NOT DETECTED NOT DETECTED Final   Parainfluenza Virus 1 NOT DETECTED NOT DETECTED Final   Parainfluenza Virus 2 NOT DETECTED NOT DETECTED Final   Parainfluenza Virus 3 NOT DETECTED NOT DETECTED Final   Parainfluenza Virus 4 NOT DETECTED NOT DETECTED Final   Respiratory Syncytial Virus NOT DETECTED NOT DETECTED Final   Bordetella pertussis NOT DETECTED NOT DETECTED Final   Chlamydophila pneumoniae NOT DETECTED NOT DETECTED Final   Mycoplasma pneumoniae NOT DETECTED NOT DETECTED Final        Code Status Orders  (From admission, onward)         Start     Ordered   11/26/18 0548  Full code  Continuous     11/26/18 0547        Code Status History    Date Active Date Inactive Code Status Order ID Comments User Context   07/27/2017 1947 07/28/2017 1920 Full Code [id number]  Epifanio Lesches, MD ED            Discharge Medications   Allergies as of 11/30/2018   No Known  Allergies     Medication List    STOP taking these medications   atorvastatin 10 MG tablet Commonly known as:  LIPITOR     TAKE these medications   acetaminophen 500 MG tablet Commonly known as:  TYLENOL Take 1,000 mg by mouth every 8 (eight) hours as needed for moderate pain.   albuterol 108 (90 Base) MCG/ACT inhaler Commonly known as:  PROVENTIL HFA;VENTOLIN HFA Inhale 2 puffs into the lungs every 4 (four) hours as needed for wheezing or shortness of breath.   amitriptyline 100 MG tablet Commonly known as:  ELAVIL Take 50 mg by mouth at bedtime as needed for sleep.   aspirin 325 MG tablet Take 325 mg by mouth daily.   azelastine 0.05 % ophthalmic solution Commonly known as:  OPTIVAR Place 1 drop into both eyes daily.   Drue Stager  2 % Generic drug:  mupirocin ointment Apply 1 application topically daily.   diphenhydrAMINE 25 MG tablet Commonly known as:  BENADRYL Take 25 mg by mouth 2 (two) times daily as needed for itching or allergies.   doxycycline 100 MG tablet Commonly known as:  VIBRA-TABS Take 1 tablet (100 mg total) by mouth every 12 (twelve) hours for 3 days. Notes to patient:  Take for 3 days   FLUoxetine 40 MG capsule Commonly known as:  PROZAC Take 40 mg by mouth daily. Take with 20 mg capsule at bedtime   FLUoxetine 20 MG capsule Commonly known as:  PROZAC Take 20 mg by mouth Nightly. Take along with 40 mg capsule at bedtime   fluticasone 44 MCG/ACT inhaler Commonly known as:  FLOVENT HFA Inhale 1 puff into the lungs 2 (two) times daily.   gabapentin 100 MG capsule Commonly known as:  NEURONTIN Take 300 mg by mouth every evening.   glipiZIDE 10 MG tablet Commonly known as:  GLUCOTROL Take 10 mg by mouth daily with lunch.   guaiFENesin 600 MG 12 hr tablet Commonly known as:  MUCINEX Take 1 tablet (600 mg total) by mouth 2 (two) times daily for 5 days.   guaiFENesin-dextromethorphan 100-10 MG/5ML syrup Commonly known as:  ROBITUSSIN  DM Take 15 mLs by mouth every 4 (four) hours as needed for cough.   insulin aspart protamine- aspart (70-30) 100 UNIT/ML injection Commonly known as:  NOVOLOG MIX 70/30 Inject 35-75 Units into the skin 2 (two) times daily with a meal. 75 units in the morning and 35 units at night   ipratropium-albuterol 0.5-2.5 (3) MG/3ML Soln Commonly known as:  DUONEB Take 3 mLs by nebulization 3 (three) times daily.   lisinopril-hydrochlorothiazide 20-25 MG tablet Commonly known as:  PRINZIDE,ZESTORETIC Take 1 tablet by mouth daily.   metoprolol tartrate 25 MG tablet Commonly known as:  LOPRESSOR Take 1 tablet (25 mg total) by mouth 2 (two) times daily.   omeprazole 40 MG capsule Commonly known as:  PRILOSEC Take 40 mg by mouth daily.   predniSONE 10 MG (21) Tbpk tablet Commonly known as:  STERAPRED UNI-PAK 21 TAB Start at 60mg  taper by 10mg  until complete   traMADol 50 MG tablet Commonly known as:  ULTRAM Take 1 tablet (50 mg total) by mouth every 6 (six) hours as needed.            Durable Medical Equipment  (From admission, onward)         Start     Ordered   11/30/18 1233  For home use only DME Nebulizer machine  Once    Question:  Patient needs a nebulizer to treat with the following condition  Answer:  COPD (chronic obstructive pulmonary disease) ([place])   11/30/18 1234             Total Time in preparing paper work, data evaluation and todays exam - 82 minutes  Dustin Flock M.D on 11/30/2018 at 3:34 PM [place]  [phone]

## 2018-11-30 NOTE — Care Management Note (Signed)
Case Management Note  Patient Details  Name: Kristie Olson MRN: [id number] Date of Birth: 08/13/1956   Patient to discharge home today.  Patient lives at home with husband and son.  Patient states "we are separated though, so he isn't any support".  PCP Elisabeth Cara at [facility]. Patient states that normally she drives her self to her appointments, however if she is not able to her sister in law does.  PT has assessed patient and recommends home health PT. Patient agreeable to home health RN, and PT, but is not interested home PT.  Patient states she has a RW, cane, and electric WC in  The home.  Patient states she does not preference of home health agency.  CMS Medicare.gov Compare Post Acute Care list reviewed with patient.  Referral made to [facility] with [place].  Brad with [facility] to deliver nebulizer prior to discharge.  Husband to transport at discharge.   Subjective/Objective:                    Action/Plan:   Expected Discharge Date:  11/30/18               Expected Discharge Plan:  [place]  In-House Referral:     Discharge planning Services  CM Consult  Post Acute Care Choice:  Home Health, Durable Medical Equipment Choice offered to:  Patient  DME Arranged:  Nebulizer/meds DME Agency:  [place]:  RN, Nurse's Aide [facility] Agency:  [place]  Status of Service:  Completed, signed off  If discussed at [place] of Stay Meetings, dates discussed:    Additional Comments:  Beverly Sessions, RN 11/30/2018, 3:07 PM

## 2018-11-30 NOTE — Evaluation (Signed)
Physical Therapy Evaluation Patient Details Name: Kristie Olson MRN: [id number] DOB: 10-23-1956 Today's Date: 11/30/2018   History of Present Illness  63 y/o female pt admitted on 1/12 for bronchitis. PMHx includes current smoker, COPD, DMII w/ neuropathy, HTN, and CAD.  Clinical Impression  Pt here with bronchitis and continues to have some general weakness and fatigue, but was able to ambulate >100 ft with walker and room air with O2 remaining in the 90s.  She was confident with EOB balance and ctivities and was also able to negotiate up/down steps despite some initial hesitancy to not "over-do-it."  She showed good overall safety though she remained slow and cautious t/o the session. Discussed continuing with PT at home on d/c to work on strength, balance, activity tolerance, etc but pt showed little interest.  She has some help at home, though indicates that family is not that helpful.      Follow Up Recommendations Home health PT    Equipment Recommendations  None recommended by PT    Recommendations for Other Services       Precautions / Restrictions Precautions Precautions: Fall Restrictions Weight Bearing Restrictions: No      Mobility  Bed Mobility Overal bed mobility: Modified Independent                Transfers Overall transfer level: Modified independent Equipment used: Rolling walker (2 wheeled) Transfers: Sit to/from Stand Sit to Stand: Min guard         General transfer comment: Pt able to rise to standing with FWW support  Ambulation/Gait Ambulation/Gait assistance: Modified independent (Device/Increase time) Gait Distance (Feet): 100 Feet Assistive device: Rolling walker (2 wheeled)       General Gait Details: Pt with slow, but safe ambulation using FWW.  She reports feeling slower and weaker than normal, but not unsafe  Stairs Stairs: Yes Stairs assistance: Supervision Stair Management: One rail Left;Sideways;Step to pattern Number of  Stairs: 7 General stair comments: Pt reports she typically uses both hands on one rail, managed steps safely w/o phyiscal assist this date  Wheelchair Mobility    Modified Rankin (Stroke Patients Only)       Balance Overall balance assessment: Modified Independent                                           Pertinent Vitals/Pain Pain Score: (chronic mild general pain)    Home Living Family/patient expects to be discharged to:: Private residence Living Arrangements: Spouse/significant other;Children Available Help at Discharge: Family Type of Home: House Home Access: Stairs to enter Entrance Stairs-Rails: Right;Left;Can reach both Entrance Stairs-Number of Steps: 7 Home Layout: One level Home Equipment: Bedside commode;Walker - 2 wheels;Walker - 4 wheels;Cane - single point;Shower seat;Hand held shower head      Prior Function Level of Independence: Independent with assistive device(s)         Comments: Mod I for ADL (seated shower), tends to "furniture cruise", has a SPC in every room. More recently has been using her rollator for mobility 2/2 increasing fatigue. 2 falls in past 12 months 2/2 turning too quickly and LOB.      Hand Dominance   Dominant Hand: Right    Extremity/Trunk Assessment   Upper Extremity Assessment Upper Extremity Assessment: Overall WFL for tasks assessed;Generalized weakness            Communication   Communication:  No difficulties  Cognition Arousal/Alertness: Awake/alert Behavior During Therapy: WFL for tasks assessed/performed Overall Cognitive Status: Within Functional Limits for tasks assessed                                        General Comments      Exercises     Assessment/Plan    PT Assessment Patient needs continued PT services  PT Problem List Decreased strength;Decreased range of motion;Decreased activity tolerance;Decreased balance;Decreased mobility;Decreased knowledge of use  of DME;Decreased safety awareness;Cardiopulmonary status limiting activity       PT Treatment Interventions DME instruction;Gait training;Stair training;Functional mobility training;Therapeutic activities;Therapeutic exercise;Balance training;Neuromuscular re-education;Patient/family education    PT Goals (Current goals can be found in the Care Plan section)  Acute Rehab PT Goals Patient Stated Goal: to feel better and have more energy PT Goal Formulation: With patient Time For Goal Achievement: 12/14/18 Potential to Achieve Goals: Fair    Frequency Min 2X/week   Barriers to discharge        Co-evaluation               AM-PAC PT "6 Clicks" Mobility  Outcome Measure Help needed turning from your back to your side while in a flat bed without using bedrails?: None Help needed moving from lying on your back to sitting on the side of a flat bed without using bedrails?: None Help needed moving to and from a bed to a chair (including a wheelchair)?: None Help needed standing up from a chair using your arms (e.g., wheelchair or bedside chair)?: None Help needed to walk in hospital room?: None Help needed climbing 3-5 steps with a railing? : A Little 6 Click Score: 23    End of Session Equipment Utilized During Treatment: Gait belt Activity Tolerance: Patient limited by fatigue;Patient tolerated treatment well Patient left: with bed alarm set;with call bell/phone within reach Nurse Communication: Mobility status PT Visit Diagnosis: Muscle weakness (generalized) (M62.81);Difficulty in walking, not elsewhere classified (R26.2)    Time: 0786-7544 PT Time Calculation (min) (ACUTE ONLY): 29 min   Charges:   PT Evaluation $PT Eval Low Complexity: 1 Low PT Treatments $Gait Training: 8-22 mins        Kreg Shropshire, DPT 11/30/2018, 11:06 AM

## 2018-11-30 NOTE — Progress Notes (Signed)
Inpatient Diabetes Program Recommendations  AACE/ADA: New Consensus Statement on Inpatient Glycemic Control   Target Ranges:  Prepandial:   less than 140 mg/dL      Peak postprandial:   less than 180 mg/dL (1-2 hours)      Critically ill patients:  140 - 180 mg/dL   Results for Kristie Olson, Kristie Olson (MRN [id number]) as of 11/30/2018 08:39  Ref. Range 11/29/2018 07:36 11/29/2018 11:22 11/29/2018 16:10 11/29/2018 22:00 11/30/2018 07:33  Glucose-Capillary Latest Ref Range: 70 - 99 mg/dL 419 (H) 322 (H) 314 (H) 163 (H) 437 (H)   Review of Glycemic Control  Diabetes history:DM2 Outpatient Diabetes medications:70/30 75 units QAM, 70/30 35 units QPM, Glipizide 10 mg daily Current orders for Inpatient glycemic control:Lantus60units daily, Novolog 0-20 units TID with meals, Novolog 0-5 units QHS, Novolog 12 units TID with meals for meal coverage; Solumedrol 40 mgQ12H  Inpatient Diabetes Program Recommendations: Insulin-Basal: If steroids are continued, please consider continuing Lantus to 60 units daily and adding Lantus 10 units QHS.  Insulin - Meal Coverage:If steroids are continued as ordered, please considerincreasing meal coverage toNovolog 16units TID with meals for meal coverage if patient eats at least 50% of meals.  Thanks, Barnie Alderman, RN, MSN, CDE Diabetes Coordinator Inpatient Diabetes Program [phone] (Team Pager from 8am to 5pm)

## 2019-05-24 ENCOUNTER — Emergency Department
Admission: EM | Admit: 2019-05-24 | Discharge: 2019-05-24 | Disposition: A | Discharge status: Home / Self Care | Payer: Medicare Other | Attending: Emergency Medicine | Admitting: Emergency Medicine

## 2019-05-24 ENCOUNTER — Emergency Department: Payer: Medicare Other

## 2019-05-24 ENCOUNTER — Other Ambulatory Visit: Payer: Self-pay

## 2019-05-24 ENCOUNTER — Encounter: Payer: Self-pay | Admitting: Emergency Medicine

## 2019-05-24 DIAGNOSIS — Z79899 Other long term (current) drug therapy: Secondary | ICD-10-CM | POA: Insufficient documentation

## 2019-05-24 DIAGNOSIS — R0789 Other chest pain: Secondary | ICD-10-CM | POA: Insufficient documentation

## 2019-05-24 DIAGNOSIS — I10 Essential (primary) hypertension: Secondary | ICD-10-CM | POA: Insufficient documentation

## 2019-05-24 DIAGNOSIS — Z794 Long term (current) use of insulin: Secondary | ICD-10-CM | POA: Insufficient documentation

## 2019-05-24 DIAGNOSIS — F17210 Nicotine dependence, cigarettes, uncomplicated: Secondary | ICD-10-CM | POA: Diagnosis not present

## 2019-05-24 DIAGNOSIS — R0602 Shortness of breath: Secondary | ICD-10-CM | POA: Insufficient documentation

## 2019-05-24 DIAGNOSIS — I2510 Atherosclerotic heart disease of native coronary artery without angina pectoris: Secondary | ICD-10-CM | POA: Insufficient documentation

## 2019-05-24 DIAGNOSIS — E119 Type 2 diabetes mellitus without complications: Secondary | ICD-10-CM | POA: Diagnosis not present

## 2019-05-24 DIAGNOSIS — J45909 Unspecified asthma, uncomplicated: Secondary | ICD-10-CM | POA: Diagnosis not present

## 2019-05-24 DIAGNOSIS — Z7982 Long term (current) use of aspirin: Secondary | ICD-10-CM | POA: Diagnosis not present

## 2019-05-24 LAB — CBC
HCT: 38.9 % (ref 36.0–46.0)
Hemoglobin: 13 g/dL (ref 12.0–15.0)
MCH: 31.4 pg (ref 26.0–34.0)
MCHC: 33.4 g/dL (ref 30.0–36.0)
MCV: 94 fL (ref 80.0–100.0)
Platelets: 412 10*3/uL — ABNORMAL HIGH (ref 150–400)
RBC: 4.14 MIL/uL (ref 3.87–5.11)
RDW: 13.6 % (ref 11.5–15.5)
WBC: 8.7 10*3/uL (ref 4.0–10.5)
nRBC: 0 % (ref 0.0–0.2)

## 2019-05-24 LAB — BASIC METABOLIC PANEL
Anion gap: 7 (ref 5–15)
BUN: 11 mg/dL (ref 8–23)
CO2: 30 mmol/L (ref 22–32)
Calcium: 9.1 mg/dL (ref 8.9–10.3)
Chloride: 102 mmol/L (ref 98–111)
Creatinine, Ser: 0.75 mg/dL (ref 0.44–1.00)
GFR calc Af Amer: >60 mL/min (ref >60)
GFR calc non Af Amer: >60 mL/min (ref >60)
Glucose, Bld: 225 mg/dL — ABNORMAL HIGH (ref 70–99)
Potassium: 4.2 mmol/L (ref 3.5–5.1)
Sodium: 139 mmol/L (ref 135–145)

## 2019-05-24 LAB — TROPONIN I (HIGH SENSITIVITY): Troponin I (High Sensitivity): 6 ng/L (ref <18)

## 2019-05-24 NOTE — ED Provider Notes (Signed)
[facility] Emergency Department Provider Note ____________________________________________   First MD Initiated Contact with Patient 05/24/19 1858     (approximate)  I have reviewed the triage vital signs and the nursing notes.   HISTORY  Chief Complaint Chest Pain    HPI Kristie Olson is a 63 y.o. female with chest pain, acute onset around 10 AM today, described as sharp and radiating up to her neck and throat.  It resolved after a short time.  She was at the dentist office and was given 324 of aspirin.  Patient reports some mild shortness of breath over the last few months but it was not acutely worse today.  She denies associated nausea or lightheadedness.  She has had no prior pain like this.  She has been asymptomatic during the whole time she has been waiting in the ED.  Past Medical History:  Diagnosis Date  . Asthma   . Coronary artery disease   . Diabetes mellitus without complication ([place]) 2080  . Hypertension     Patient Active Problem List   Diagnosis Date Noted  . Bronchitis 11/26/2018  . Breast abscess of female 03/29/2018  . Slurred speech 07/27/2017    Past Surgical History:  Procedure Laterality Date  . BREAST EXCISIONAL BIOPSY Left 1980's   neg  . BREAST MASS EXCISION Left    age 9"s, benign  . CESAREAN SECTION    . CHOLECYSTECTOMY  2009    Prior to Admission medications   Medication Sig Start Date End Date Taking? Authorizing Provider  acetaminophen (TYLENOL) 500 MG tablet Take 1,000 mg by mouth every 8 (eight) hours as needed for moderate pain.    [provider]  albuterol (PROVENTIL HFA;VENTOLIN HFA) 108 (90 Base) MCG/ACT inhaler Inhale 2 puffs into the lungs every 4 (four) hours as needed for wheezing or shortness of breath.    [provider]  amitriptyline (ELAVIL) 100 MG tablet Take 50 mg by mouth at bedtime as needed for sleep.    [provider]  aspirin 325 MG tablet Take 325 mg by  mouth daily.    [provider]  azelastine (OPTIVAR) 0.05 % ophthalmic solution Place 1 drop into both eyes daily. 04/12/17   [provider]  diphenhydrAMINE (BENADRYL) 25 MG tablet Take 25 mg by mouth 2 (two) times daily as needed for itching or allergies.    [provider]  FLUoxetine (PROZAC) 20 MG capsule Take 20 mg by mouth Nightly. Take along with 40 mg capsule at bedtime    [provider]  FLUoxetine (PROZAC) 40 MG capsule Take 40 mg by mouth daily. Take with 20 mg capsule at bedtime    [provider]  fluticasone (FLOVENT HFA) 44 MCG/ACT inhaler Inhale 1 puff into the lungs 2 (two) times daily.    [provider]  gabapentin (NEURONTIN) 100 MG capsule Take 300 mg by mouth every evening.     [provider]  glipiZIDE (GLUCOTROL) 10 MG tablet Take 10 mg by mouth daily with lunch.     [provider]  guaiFENesin-dextromethorphan (ROBITUSSIN DM) 100-10 MG/5ML syrup Take 15 mLs by mouth every 4 (four) hours as needed for cough. 11/30/18   Dustin Flock, MD  insulin aspart protamine- aspart (NOVOLOG MIX 70/30) (70-30) 100 UNIT/ML injection Inject 35-75 Units into the skin 2 (two) times daily with a meal. 75 units in the morning and 35 units at night    [provider]  ipratropium-albuterol (DUONEB)  0.5-2.5 (3) MG/3ML SOLN Take 3 mLs by nebulization 3 (three) times daily. 11/30/18   Dustin Flock, MD  lisinopril-hydrochlorothiazide (PRINZIDE,ZESTORETIC) 20-25 MG tablet Take 1 tablet by mouth daily.    [provider]  metoprolol tartrate (LOPRESSOR) 25 MG tablet Take 1 tablet (25 mg total) by mouth 2 (two) times daily. 11/30/18   Dustin Flock, MD  mupirocin ointment (BACTROBAN) 2 % Apply 1 application topically daily.    [provider]  omeprazole (PRILOSEC) 40 MG capsule Take 40 mg by mouth daily.    [provider]  predniSONE (STERAPRED UNI-PAK 21 TAB) 10 MG (21) TBPK tablet  Start at 60mg  taper by 10mg  until complete 11/30/18   Dustin Flock, MD  traMADol (ULTRAM) 50 MG tablet Take 1 tablet (50 mg total) by mouth every 6 (six) hours as needed. 07/11/18 07/11/19  Gregor Hams, MD    Allergies Ibuprofen  Family History  Problem Relation Age of Onset  . Breast cancer Sister 49  . Breast cancer Paternal Aunt     Social History Social History   Tobacco Use  . Smoking status: Current Every Day Smoker    Packs/day: 0.75    Years: 46.00    Pack years: 34.50    Types: Cigarettes  . Smokeless tobacco: Never Used  Substance Use Topics  . Alcohol use: No  . Drug use: Not on file    Review of Systems  Constitutional: No fever. Eyes: No visual changes. ENT: No sore throat. Cardiovascular: Positive for resolved chest pain. Respiratory: Denies shortness of breath. Gastrointestinal: No vomiting or diarrhea.  Genitourinary: Negative for dysuria.  Musculoskeletal: Negative for back pain. Skin: Negative for rash. Neurological: Negative for headache.   ____________________________________________   PHYSICAL EXAM:  VITAL SIGNS: ED Triage Vitals  Enc Vitals Group     BP 05/24/19 1410 (!) 126/53     Pulse Rate 05/24/19 1410 97     Resp 05/24/19 1410 18     Temp 05/24/19 1410 98.4 F (36.9 C)     Temp Source 05/24/19 1410 Oral     SpO2 05/24/19 1410 98 %     Weight 05/24/19 1426 200 lb (90.7 kg)     Height 05/24/19 1426 5' (1.524 m)     Head Circumference --      Peak Flow --      Pain Score 05/24/19 1426 3     Pain Loc --      Pain Edu? --      Excl. in [place]? --     Constitutional: Alert and oriented. Well appearing and in no acute distress. Eyes: Conjunctivae are normal.  Head: Atraumatic. Nose: No congestion/rhinnorhea. Mouth/Throat: Mucous membranes are moist.   Neck: Normal range of motion.  Cardiovascular: Good peripheral circulation. Respiratory: Normal respiratory effort.  No retractions.  Gastrointestinal: No distention.   Musculoskeletal: No lower extremity edema.  Extremities warm and well perfused.  Neurologic:  Normal speech and language. No gross focal neurologic deficits are appreciated.  Skin:  Skin is warm and dry. No rash noted. Psychiatric: Mood and affect are normal. Speech and behavior are normal.  ____________________________________________   LABS (all labs ordered are listed, but only abnormal results are displayed)  Labs Reviewed  BASIC METABOLIC PANEL - Abnormal; Notable for the following components:      Result Value   Glucose, Bld 225 (*)    All other components within normal limits  CBC - Abnormal; Notable for the following components:   Platelets  412 (*)    All other components within normal limits  TROPONIN I (HIGH SENSITIVITY)   ____________________________________________  EKG  ED ECG REPORT I, Arta Silence, the attending physician, personally viewed and interpreted this ECG.  Date: 05/24/2019 EKG Time: 1407 Rate: 101 Rhythm: normal sinus rhythm QRS Axis: Left axis Intervals: RBBB ST/T Wave abnormalities: normal Narrative Interpretation: no evidence of acute ischemia; no significant change when compared to EKG of 12/01/2018  ____________________________________________  RADIOLOGY  CXR: No focal infiltrate or other acute abnormality  ____________________________________________   PROCEDURES  Procedure(s) performed: No  Procedures  Critical Care performed: No ____________________________________________   INITIAL IMPRESSION / ASSESSMENT AND PLAN / ED COURSE  Pertinent labs & imaging results that were available during my care of the patient were reviewed by me and considered in my medical decision making (see chart for details).  63 year old female with PMH as noted above presents with brief episode of chest pain earlier this morning which resolved.  The patient received aspirin prior to coming to the ED.  She states that her pain essentially  resolved before arrival and she has had no pain while waiting.  On exam the patient is overall well-appearing.  Her vital signs are normal.  The physical exam is unremarkable.  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  Although the patient does have a CAD history, recent echo in 2018 shows no regional wall motion abnormalities.  Her presentation today is atypical for ACS.  Given the resolved pain, there is no clinical evidence for PE, aortic dissection, or other concerning emergent cause.  I recommended that we obtain a repeat troponin now that she has been waiting for several hours in order to rule out ACS.  However, the patient states that she wants to go home now.  She states she has already been here for over 5 hours and does not want to wait for further testing.  She understands that without this I cannot fully rule out a heart attack which could cause permanent disability or death.  She demonstrates appropriate decision-making capacity.  Clinically the risk of ACS is low.  I encouraged the patient to return at any time if she has recurrent or worsening pain or changes her mind and wants to resume her work-up.    ____________________________________________   FINAL CLINICAL IMPRESSION(S) / ED DIAGNOSES  Final diagnoses:  Atypical chest pain      NEW MEDICATIONS STARTED DURING THIS VISIT:  Discharge Medication List as of 05/24/2019  7:24 PM       Note:  This document was prepared using Dragon voice recognition software and may include unintentional dictation errors.    Arta Silence, MD 05/24/19 1949

## 2019-05-24 NOTE — Discharge Instructions (Addendum)
Follow-up with your primary care doctor within the next 1 to 2 weeks.  Return to the ER immediately if you have new, worsening, or persistent chest pain, shortness of breath, weakness, or any other new or worsening symptoms that concern you.

## 2019-05-24 NOTE — ED Triage Notes (Signed)
Patient states she started having central chest pain at approximately 1030 this morning. Reports she was given 324 of ASA at her dentist office. Patient also reports generalized weakness and SOB for the last few months.

## 2019-10-16 ENCOUNTER — Telehealth: Payer: Self-pay

## 2019-10-16 DIAGNOSIS — Z87891 Personal history of nicotine dependence: Secondary | ICD-10-CM

## 2019-10-16 NOTE — Telephone Encounter (Signed)
  Patient has been notified that lung cancer screening CT scan is due currently or will be in near future. Confirmed that patient is within the appropriate age range, and asymptomatic, (no signs or symptoms of lung cancer). Patient denies illness that would prevent curative treatment for lung cancer if found. Verified smoking history, 1/2 ppd. Patient is agreeable for CT scan being scheduled on a Wednesday or Thursday after lunch time.

## 2019-10-22 NOTE — Telephone Encounter (Signed)
Attempted to give appt for lung screening. Patient reports she "has too much going on right now". Agreed that I would contact her at a later date to schedule.

## 2019-11-01 NOTE — Telephone Encounter (Signed)
Contacted and scheduled 

## 2019-11-01 NOTE — Addendum Note (Signed)
Addended by: Lieutenant Diego on: 11/01/2019 10:55 AM   Modules accepted: Orders

## 2019-11-07 ENCOUNTER — Other Ambulatory Visit: Payer: Self-pay

## 2019-11-07 ENCOUNTER — Ambulatory Visit
Admission: RE | Admit: 2019-11-07 | Discharge: 2019-11-07 | Disposition: A | Discharge status: Home / Self Care | Payer: Medicare Other | Source: Ambulatory Visit | Attending: Oncology | Admitting: Oncology

## 2019-11-07 DIAGNOSIS — Z87891 Personal history of nicotine dependence: Secondary | ICD-10-CM | POA: Insufficient documentation

## 2019-11-13 ENCOUNTER — Encounter: Payer: Self-pay | Admitting: *Deleted

## 2020-04-28 ENCOUNTER — Emergency Department: Payer: Medicare Other

## 2020-04-28 ENCOUNTER — Inpatient Hospital Stay
Admission: AD | Admit: 2020-04-28 | Discharge: 2020-05-01 | DRG: 439 | Disposition: A | Discharge status: Home / Self Care | Payer: Medicare Other | Attending: Internal Medicine | Admitting: Internal Medicine

## 2020-04-28 ENCOUNTER — Encounter: Payer: Self-pay | Admitting: Radiology

## 2020-04-28 ENCOUNTER — Other Ambulatory Visit: Payer: Self-pay

## 2020-04-28 DIAGNOSIS — E860 Dehydration: Secondary | ICD-10-CM | POA: Diagnosis present

## 2020-04-28 DIAGNOSIS — I452 Bifascicular block: Secondary | ICD-10-CM | POA: Diagnosis present

## 2020-04-28 DIAGNOSIS — Z7951 Long term (current) use of inhaled steroids: Secondary | ICD-10-CM | POA: Diagnosis not present

## 2020-04-28 DIAGNOSIS — Z803 Family history of malignant neoplasm of breast: Secondary | ICD-10-CM | POA: Diagnosis not present

## 2020-04-28 DIAGNOSIS — T502X5A Adverse effect of carbonic-anhydrase inhibitors, benzothiadiazides and other diuretics, initial encounter: Secondary | ICD-10-CM | POA: Diagnosis present

## 2020-04-28 DIAGNOSIS — Z79899 Other long term (current) drug therapy: Secondary | ICD-10-CM | POA: Diagnosis not present

## 2020-04-28 DIAGNOSIS — E1165 Type 2 diabetes mellitus with hyperglycemia: Secondary | ICD-10-CM | POA: Diagnosis present

## 2020-04-28 DIAGNOSIS — K859 Acute pancreatitis without necrosis or infection, unspecified: Secondary | ICD-10-CM | POA: Diagnosis present

## 2020-04-28 DIAGNOSIS — Y929 Unspecified place or not applicable: Secondary | ICD-10-CM

## 2020-04-28 DIAGNOSIS — I2510 Atherosclerotic heart disease of native coronary artery without angina pectoris: Secondary | ICD-10-CM | POA: Diagnosis present

## 2020-04-28 DIAGNOSIS — Z9049 Acquired absence of other specified parts of digestive tract: Secondary | ICD-10-CM | POA: Diagnosis not present

## 2020-04-28 DIAGNOSIS — K853 Drug induced acute pancreatitis without necrosis or infection: Principal | ICD-10-CM

## 2020-04-28 DIAGNOSIS — F329 Major depressive disorder, single episode, unspecified: Secondary | ICD-10-CM

## 2020-04-28 DIAGNOSIS — J449 Chronic obstructive pulmonary disease, unspecified: Secondary | ICD-10-CM | POA: Diagnosis present

## 2020-04-28 DIAGNOSIS — E785 Hyperlipidemia, unspecified: Secondary | ICD-10-CM | POA: Diagnosis present

## 2020-04-28 DIAGNOSIS — E781 Pure hyperglyceridemia: Secondary | ICD-10-CM | POA: Diagnosis not present

## 2020-04-28 DIAGNOSIS — Z794 Long term (current) use of insulin: Secondary | ICD-10-CM

## 2020-04-28 DIAGNOSIS — I10 Essential (primary) hypertension: Secondary | ICD-10-CM | POA: Diagnosis present

## 2020-04-28 DIAGNOSIS — Z20822 Contact with and (suspected) exposure to covid-19: Secondary | ICD-10-CM | POA: Diagnosis present

## 2020-04-28 DIAGNOSIS — Z7982 Long term (current) use of aspirin: Secondary | ICD-10-CM | POA: Diagnosis not present

## 2020-04-28 DIAGNOSIS — F17210 Nicotine dependence, cigarettes, uncomplicated: Secondary | ICD-10-CM | POA: Diagnosis present

## 2020-04-28 DIAGNOSIS — F32A Depression, unspecified: Secondary | ICD-10-CM | POA: Diagnosis present

## 2020-04-28 LAB — URINALYSIS, COMPLETE (UACMP) WITH MICROSCOPIC
Bilirubin Urine: NEGATIVE
Glucose, UA: >=500 mg/dL — AB
Hgb urine dipstick: NEGATIVE
Ketones, ur: 5 mg/dL — AB
Leukocytes,Ua: NEGATIVE
Nitrite: NEGATIVE
Protein, ur: NEGATIVE mg/dL
Specific Gravity, Urine: 1.039 — ABNORMAL HIGH (ref 1.005–1.030)
pH: 5 (ref 5.0–8.0)

## 2020-04-28 LAB — COMPREHENSIVE METABOLIC PANEL
ALT: 14 U/L (ref 0–44)
AST: 17 U/L (ref 15–41)
Albumin: 4.3 g/dL (ref 3.5–5.0)
Alkaline Phosphatase: 89 U/L (ref 38–126)
Anion gap: 15 (ref 5–15)
BUN: 21 mg/dL (ref 8–23)
CO2: 27 mmol/L (ref 22–32)
Calcium: 10.4 mg/dL — ABNORMAL HIGH (ref 8.9–10.3)
Chloride: 89 mmol/L — ABNORMAL LOW (ref 98–111)
Creatinine, Ser: 1.18 mg/dL — ABNORMAL HIGH (ref 0.44–1.00)
GFR calc Af Amer: 56 mL/min — ABNORMAL LOW (ref >60)
GFR calc non Af Amer: 48 mL/min — ABNORMAL LOW (ref >60)
Glucose, Bld: 387 mg/dL — ABNORMAL HIGH (ref 70–99)
Potassium: 4.3 mmol/L (ref 3.5–5.1)
Sodium: 131 mmol/L — ABNORMAL LOW (ref 135–145)
Total Bilirubin: 0.6 mg/dL (ref 0.3–1.2)
Total Protein: 8.2 g/dL — ABNORMAL HIGH (ref 6.5–8.1)

## 2020-04-28 LAB — LIPASE, BLOOD: Lipase: 69 U/L — ABNORMAL HIGH (ref 11–51)

## 2020-04-28 LAB — CBC
HCT: 42.6 % (ref 36.0–46.0)
Hemoglobin: 14.9 g/dL (ref 12.0–15.0)
MCH: 32 pg (ref 26.0–34.0)
MCHC: 35 g/dL (ref 30.0–36.0)
MCV: 91.6 fL (ref 80.0–100.0)
Platelets: 483 10*3/uL — ABNORMAL HIGH (ref 150–400)
RBC: 4.65 MIL/uL (ref 3.87–5.11)
RDW: 13 % (ref 11.5–15.5)
WBC: 8.9 10*3/uL (ref 4.0–10.5)
nRBC: 0 % (ref 0.0–0.2)

## 2020-04-28 LAB — SARS CORONAVIRUS 2 BY RT PCR (HOSPITAL ORDER, PERFORMED IN ~~LOC~~ HOSPITAL LAB): SARS Coronavirus 2: NEGATIVE

## 2020-04-28 LAB — TROPONIN I (HIGH SENSITIVITY)
Troponin I (High Sensitivity): 7 ng/L (ref <18)
Troponin I (High Sensitivity): 7 ng/L (ref <18)

## 2020-04-28 LAB — HEMOGLOBIN A1C
Hgb A1c MFr Bld: 10.8 % — ABNORMAL HIGH (ref 4.8–5.6)
Mean Plasma Glucose: 263.26 mg/dL

## 2020-04-28 LAB — GLUCOSE, CAPILLARY
Glucose-Capillary: 289 mg/dL — ABNORMAL HIGH (ref 70–99)
Glucose-Capillary: 243 mg/dL — ABNORMAL HIGH (ref 70–99)

## 2020-04-28 MED ORDER — AMLODIPINE BESYLATE 5 MG PO TABS
2.5000 mg | ORAL_TABLET | Freq: Every day | ORAL | Status: DC
  Administered 2020-04-29 – 2020-05-01 (×3): 2.5 mg via ORAL
  Filled 2020-04-28 (×3): qty 1

## 2020-04-28 MED ORDER — IPRATROPIUM-ALBUTEROL 0.5-2.5 (3) MG/3ML IN SOLN
3.0000 mL | Freq: Three times a day (TID) | RESPIRATORY_TRACT | Status: DC
  Administered 2020-04-28 – 2020-04-29 (×2): 3 mL via RESPIRATORY_TRACT
  Filled 2020-04-28 (×2): qty 3

## 2020-04-28 MED ORDER — INSULIN ASPART 100 UNIT/ML ~~LOC~~ SOLN
0.0000 [IU] | SUBCUTANEOUS | Status: DC
  Administered 2020-04-28: 7 [IU] via SUBCUTANEOUS
  Administered 2020-04-28: 18:00:00 11 [IU] via SUBCUTANEOUS
  Administered 2020-04-29: 20 [IU] via SUBCUTANEOUS
  Administered 2020-04-29: 10:00:00 3 [IU] via SUBCUTANEOUS
  Administered 2020-04-29: 01:00:00 7 [IU] via SUBCUTANEOUS
  Administered 2020-04-29: 20:00:00 3 [IU] via SUBCUTANEOUS
  Administered 2020-04-30: 15 [IU] via SUBCUTANEOUS
  Administered 2020-04-30 (×2): 4 [IU] via SUBCUTANEOUS
  Administered 2020-04-30: 21:00:00 7 [IU] via SUBCUTANEOUS
  Administered 2020-05-01: 09:00:00 4 [IU] via SUBCUTANEOUS
  Administered 2020-05-01: 11 [IU] via SUBCUTANEOUS
  Filled 2020-04-28 (×12): qty 1

## 2020-04-28 MED ORDER — SODIUM CHLORIDE 0.9 % IV SOLN
1000.0000 mL | Freq: Once | INTRAVENOUS | Status: AC
  Administered 2020-04-28: 1000 mL via INTRAVENOUS

## 2020-04-28 MED ORDER — AMITRIPTYLINE HCL 25 MG PO TABS: 50.0000 mg | ORAL_TABLET | Freq: Every evening | ORAL | Status: DC | PRN

## 2020-04-28 MED ORDER — ENOXAPARIN SODIUM 40 MG/0.4ML ~~LOC~~ SOLN
40.0000 mg | SUBCUTANEOUS | Status: DC
  Administered 2020-04-28 – 2020-04-30 (×3): 40 mg via SUBCUTANEOUS
  Filled 2020-04-28 (×3): qty 0.4

## 2020-04-28 MED ORDER — DEXTROSE-NACL 5-0.9 % IV SOLN
INTRAVENOUS | Status: DC
Start: 2020-04-28 — End: 2020-04-28

## 2020-04-28 MED ORDER — FLUOXETINE HCL 20 MG PO CAPS
40.0000 mg | ORAL_CAPSULE | Freq: Every day | ORAL | Status: DC
  Administered 2020-04-29 – 2020-05-01 (×3): 40 mg via ORAL
  Filled 2020-04-28 (×3): qty 2

## 2020-04-28 MED ORDER — MORPHINE SULFATE (PF) 2 MG/ML IV SOLN
2.0000 mg | INTRAVENOUS | Status: DC | PRN
  Administered 2020-04-28 – 2020-04-29 (×4): 2 mg via INTRAVENOUS
  Filled 2020-04-28 (×4): qty 1

## 2020-04-28 MED ORDER — ONDANSETRON HCL 4 MG PO TABS: 4.0000 mg | ORAL_TABLET | Freq: Four times a day (QID) | ORAL | Status: DC | PRN

## 2020-04-28 MED ORDER — METOPROLOL TARTRATE 25 MG PO TABS
25.0000 mg | ORAL_TABLET | Freq: Two times a day (BID) | ORAL | Status: DC
  Administered 2020-04-29 – 2020-05-01 (×4): 25 mg via ORAL
  Filled 2020-04-28 (×5): qty 1

## 2020-04-28 MED ORDER — ASPIRIN 325 MG PO TABS
325.0000 mg | ORAL_TABLET | Freq: Every day | ORAL | Status: DC
  Administered 2020-04-29 – 2020-05-01 (×3): 325 mg via ORAL
  Filled 2020-04-28 (×4): qty 1

## 2020-04-28 MED ORDER — ACETAMINOPHEN 500 MG PO TABS
1000.0000 mg | ORAL_TABLET | Freq: Three times a day (TID) | ORAL | Status: DC | PRN
  Filled 2020-04-28: qty 2

## 2020-04-28 MED ORDER — MORPHINE SULFATE (PF) 4 MG/ML IV SOLN
4.0000 mg | Freq: Once | INTRAVENOUS | Status: AC
  Administered 2020-04-28: 4 mg via INTRAVENOUS
  Filled 2020-04-28: qty 1

## 2020-04-28 MED ORDER — SODIUM CHLORIDE 0.9 % IV SOLN: INTRAVENOUS | Status: DC

## 2020-04-28 MED ORDER — ONDANSETRON HCL 4 MG/2ML IJ SOLN
4.0000 mg | Freq: Once | INTRAMUSCULAR | Status: AC
  Administered 2020-04-28: 4 mg via INTRAVENOUS
  Filled 2020-04-28: qty 2

## 2020-04-28 MED ORDER — BUDESONIDE 0.25 MG/2ML IN SUSP
0.2500 mg | Freq: Two times a day (BID) | RESPIRATORY_TRACT | Status: DC
  Administered 2020-04-28 – 2020-05-01 (×6): 0.25 mg via RESPIRATORY_TRACT
  Filled 2020-04-28 (×6): qty 2

## 2020-04-28 MED ORDER — DIPHENHYDRAMINE HCL 25 MG PO TABS
25.0000 mg | ORAL_TABLET | Freq: Two times a day (BID) | ORAL | Status: DC | PRN
  Filled 2020-04-28: qty 1

## 2020-04-28 MED ORDER — IPRATROPIUM-ALBUTEROL 0.5-2.5 (3) MG/3ML IN SOLN: 3.0000 mL | Freq: Three times a day (TID) | RESPIRATORY_TRACT | Status: DC

## 2020-04-28 MED ORDER — ONDANSETRON HCL 4 MG/2ML IJ SOLN: 4.0000 mg | Freq: Four times a day (QID) | INTRAMUSCULAR | Status: DC | PRN

## 2020-04-28 MED ORDER — FLUTICASONE PROPIONATE HFA 44 MCG/ACT IN AERO: 1.0000 | INHALATION_SPRAY | Freq: Two times a day (BID) | RESPIRATORY_TRACT | Status: DC

## 2020-04-28 MED ORDER — FLUOXETINE HCL 20 MG PO CAPS: 20.0000 mg | ORAL_CAPSULE | Freq: Every evening | ORAL | Status: DC

## 2020-04-28 MED ORDER — IOHEXOL 300 MG/ML  SOLN
100.0000 mL | Freq: Once | INTRAMUSCULAR | Status: AC | PRN
  Administered 2020-04-28: 100 mL via INTRAVENOUS

## 2020-04-28 MED ORDER — PANTOPRAZOLE SODIUM 40 MG IV SOLR
40.0000 mg | INTRAVENOUS | Status: DC
  Administered 2020-04-28 – 2020-04-30 (×3): 40 mg via INTRAVENOUS
  Filled 2020-04-28 (×4): qty 40

## 2020-04-28 NOTE — ED Notes (Signed)
Pt to ct 

## 2020-04-28 NOTE — ED Provider Notes (Signed)
[facility] Emergency Department Provider Note   ____________________________________________    I have reviewed the triage vital signs and the nursing notes.   HISTORY  Chief Complaint Abdominal Pain and Chest Pain     HPI Kristie Olson is a 64 y.o. female with a history of asthma, CAD, diabetes, hypertension who presents with complaints of epigastric abdominal pain for 2 days which has worsened.  She describes burning pain.  Decreased p.o. intake.  Feels fatigued and weak from lack of food.  No history of the same the past.  Does smoke cigarettes.  No history of pancreatitis.  Does not drink alcohol, denies drug use.  Has not take anything for this.  No sick contacts.  No recent travel.  No cough or shortness of breath.  No fevers noted  Past Medical History:  Diagnosis Date  . Asthma   . Coronary artery disease   . Diabetes mellitus without complication ([place]) 5681  . Hypertension     Patient Active Problem List   Diagnosis Date Noted  . Bronchitis 11/26/2018  . Breast abscess of female 03/29/2018  . Slurred speech 07/27/2017    Past Surgical History:  Procedure Laterality Date  . BREAST EXCISIONAL BIOPSY Left 1980's   neg  . BREAST MASS EXCISION Left    age 46"s, benign  . CESAREAN SECTION    . CHOLECYSTECTOMY  2009    Prior to Admission medications   Medication Sig Start Date End Date Taking? Authorizing Provider  acetaminophen (TYLENOL) 500 MG tablet Take 1,000 mg by mouth every 8 (eight) hours as needed for moderate pain.    [provider]  albuterol (PROVENTIL HFA;VENTOLIN HFA) 108 (90 Base) MCG/ACT inhaler Inhale 2 puffs into the lungs every 4 (four) hours as needed for wheezing or shortness of breath.    [provider]  amitriptyline (ELAVIL) 100 MG tablet Take 50 mg by mouth at bedtime as needed for sleep.    [provider]  aspirin 325 MG tablet Take 325 mg by mouth daily.    [provider]  azelastine (OPTIVAR) 0.05 % ophthalmic solution Place 1 drop into both eyes daily. 04/12/17   [provider]  diphenhydrAMINE (BENADRYL) 25 MG tablet Take 25 mg by mouth 2 (two) times daily as needed for itching or allergies.    [provider]  FLUoxetine (PROZAC) 20 MG capsule Take 20 mg by mouth Nightly. Take along with 40 mg capsule at bedtime    [provider]  FLUoxetine (PROZAC) 40 MG capsule Take 40 mg by mouth daily. Take with 20 mg capsule at bedtime    [provider]  fluticasone (FLOVENT HFA) 44 MCG/ACT inhaler Inhale 1 puff into the lungs 2 (two) times daily.    [provider]  gabapentin (NEURONTIN) 100 MG capsule Take 300 mg by mouth every evening.     [provider]  glipiZIDE (GLUCOTROL) 10 MG tablet Take 10 mg by mouth daily with lunch.     [provider]  guaiFENesin-dextromethorphan (ROBITUSSIN DM) 100-10 MG/5ML syrup Take 15 mLs by mouth every 4 (four) hours as needed for cough. 11/30/18   Dustin Flock, MD  insulin aspart protamine- aspart (NOVOLOG MIX 70/30) (70-30) 100 UNIT/ML injection Inject 35-75 Units into the skin 2 (two) times daily with a meal. 75 units in the morning and 35 units at night    [provider]  ipratropium-albuterol (DUONEB) 0.5-2.5 (3) MG/3ML SOLN Take 3 mLs  by nebulization 3 (three) times daily. 11/30/18   Dustin Flock, MD  lisinopril-hydrochlorothiazide (PRINZIDE,ZESTORETIC) 20-25 MG tablet Take 1 tablet by mouth daily.    [provider]  metoprolol tartrate (LOPRESSOR) 25 MG tablet Take 1 tablet (25 mg total) by mouth 2 (two) times daily. 11/30/18   Dustin Flock, MD  mupirocin ointment (BACTROBAN) 2 % Apply 1 application topically daily.    [provider]  omeprazole (PRILOSEC) 40 MG capsule Take 40 mg by mouth daily.    [provider]  predniSONE (STERAPRED UNI-PAK 21 TAB) 10 MG (21) TBPK tablet Start at 60mg  taper by 10mg   until complete 11/30/18   Dustin Flock, MD     Allergies Ibuprofen  Family History  Problem Relation Age of Onset  . Breast cancer Sister 43  . Breast cancer Paternal Aunt     Social History Social History   Tobacco Use  . Smoking status: Current Every Day Smoker    Packs/day: 0.75    Years: 46.00    Pack years: 34.50    Types: Cigarettes  . Smokeless tobacco: Never Used  Substance Use Topics  . Alcohol use: No  . Drug use: Not on file    Review of Systems  Constitutional: No fever/chills Eyes: No visual changes.  ENT: No sore throat. Cardiovascular: Sometimes pain radiates from abdomen into chest pain, burning Respiratory: As above Gastrointestinal: As above Genitourinary: Negative for dysuria. Musculoskeletal: Negative for back pain. Skin: Negative for rash. Neurological: Negative for headaches    ____________________________________________   PHYSICAL EXAM:  VITAL SIGNS: ED Triage Vitals  Enc Vitals Group     BP 04/28/20 0415 139/77     Pulse Rate 04/28/20 0415 (!) 104     Resp 04/28/20 0415 (!) 22     Temp 04/28/20 0415 98.2 F (36.8 C)     Temp Source 04/28/20 0415 Oral     SpO2 04/28/20 0415 100 %     Weight 04/28/20 0416 90.7 kg (200 lb)     Height 04/28/20 0416 1.524 m (5')     Head Circumference --      Peak Flow --      Pain Score 04/28/20 0416 3     Pain Loc --      Pain Edu? --      Excl. in [place]? --     Constitutional: Alert and oriented.   Nose: No congestion/rhinnorhea. Mouth/Throat: Mucous membranes are moist.   Neck:  Painless ROM Cardiovascular: Normal rate, regular rhythm.   Good peripheral circulation. Respiratory: Normal respiratory effort.  No retractions. Lungs CTAB. Gastrointestinal: Mild epigastric tenderness palpation. No distention.  No CVA tenderness.  Musculoskeletal:   Warm and well perfused Neurologic:  Normal speech and language. No gross focal neurologic deficits are appreciated.  Skin:  Skin is warm, dry  and intact. No rash noted. Psychiatric: Mood and affect are normal. Speech and behavior are normal.  ____________________________________________   LABS (all labs ordered are listed, but only abnormal results are displayed)  Labs Reviewed  CBC - Abnormal; Notable for the following components:      Result Value   Platelets 483 (*)    All other components within normal limits  COMPREHENSIVE METABOLIC PANEL - Abnormal; Notable for the following components:   Sodium 131 (*)    Chloride 89 (*)    Glucose, Bld 387 (*)    Creatinine, Ser 1.18 (*)    Calcium 10.4 (*)    Total Protein 8.2 (*)  GFR calc non Af Amer 48 (*)    GFR calc Af Amer 56 (*)    All other components within normal limits  LIPASE, BLOOD - Abnormal; Notable for the following components:   Lipase 69 (*)    All other components within normal limits  URINALYSIS, COMPLETE (UACMP) WITH MICROSCOPIC - Abnormal; Notable for the following components:   Color, Urine YELLOW (*)    APPearance CLEAR (*)    Specific Gravity, Urine 1.039 (*)    Glucose, UA >=500 (*)    Ketones, ur 5 (*)    Bacteria, UA RARE (*)    All other components within normal limits  SARS CORONAVIRUS 2 BY RT PCR ([facility])  TROPONIN I (HIGH SENSITIVITY)  TROPONIN I (HIGH SENSITIVITY)   ____________________________________________  EKG  ED ECG REPORT I, Lavonia Drafts, the attending physician, personally viewed and interpreted this ECG.  Date: 04/28/2020  Rhythm: normal sinus rhythm QRS Axis: normal Intervals: Incomplete right bundle branch block ST/T Wave abnormalities: normal Narrative Interpretation: no evidence of acute ischemia  ____________________________________________  RADIOLOGY  Chest x-ray, reviewed by me, no infiltrate or edema ____________________________________________   PROCEDURES  Procedure(s) performed: No  Procedures   Critical Care performed:  No ____________________________________________   INITIAL IMPRESSION / ASSESSMENT AND PLAN / ED COURSE  Pertinent labs & imaging results that were available during my care of the patient were reviewed by me and considered in my medical decision making (see chart for details).  Patient presents with epigastric pain is described above.  Has a history of a cholecystectomy.  Differential includes gastritis/PUD, pancreatitis, colitis, gastroenteritis.  Lab work demonstrates mildly elevated creatinine with GFR of 56.  Lipase is mildly elevated as well raising suspicion for pancreatitis.  EKG is unchanged from prior, troponin of 7.  White blood cell count is normal.  Elevation of platelets likely inflammatory in nature.  Will obtain CT imaging, give IV morphine, IV Zofran to evaluate for possible pancreatitis.  CT scan demonstrates stranding around the pancreas.  Noted incidental adrenal gland findings, discussed with patient and son for outpatient follow-up  I discussed with the hospitalist for admission    ____________________________________________   FINAL CLINICAL IMPRESSION(S) / ED DIAGNOSES  Final diagnoses:  Acute pancreatitis without infection or necrosis, unspecified pancreatitis type        Note:  This document was prepared using Dragon voice recognition software and may include unintentional dictation errors.   Lavonia Drafts, MD 04/28/20 1013

## 2020-04-28 NOTE — ED Triage Notes (Signed)
Pt states 4-6 days of upper abd pain, nausea, chest pain and weakness. Pt ambulatory with a cane. Pt denies known fever.

## 2020-04-28 NOTE — ED Notes (Signed)
Pt from home with abdominal pain and chest discomfort x 3-4 days. Pt states she is unable to eat well, and that she feels like she is "choking" when she eats and that she gets full really quickly. She states she is concerned about the inability to eat, because she "likes food."  Pt c/o pain 8/10 in upper abdomen and left chest area.

## 2020-04-28 NOTE — ED Notes (Signed)
covid swab collected and walked to lab

## 2020-04-28 NOTE — H&P (Signed)
History and Physical    Kristie Olson KDT:[id number] DOB: 24-Aug-1956 DOA: 04/28/2020  PCP: [facility], [facility]   Patient coming from: Home I have personally briefly reviewed patient's old medical records in [place]  Chief Complaint: Abdominal pain HPI: Kristie Olson is a 64 y.o. female with medical history significant for coronary artery disease, insulin-dependent diabetes mellitus, hypertension and asthma who presents to the emergency room for evaluation of a 1 week history of abdominal pain mostly in the epigastrium with radiation to her back.  She rates her pain a 6 x 10 in intensity at its worst associated with nausea but no vomiting as well as anorexia.  She describes the pain as a burning sensation.  She denies alcohol use and is status post cholecystectomy.  She denies having any fever, no shortness of breath, no chest pain, no cough, no urinary symptoms, no dizziness, no lightheadedness. She had a CT scan of abdomen and pelvis which showed subtle peripancreatic fat stranding anterior to the pancreatic body suggestive of acute pancreatitis. No peripancreatic fluid collection or evidence of necrosis. Bilateral indeterminate adrenal gland nodules, measuring up to 1.5 cm on the left and 1.0 cm on the right. Further evaluation with a nonemergent adrenal protocol CT is recommended. Lipase level is slightly elevated Twelve-lead EKG shows sinus rhythm with incomplete right bundle branch block left anterior fascicular block   ED Course: Patient is a 64 year old African-American female who presents to the emergency room with radiation to the back associated with nausea.  She has mildly elevated lipase level and CT scan is shows peripancreatic stranding suggestive of acute pancreatitis.  Review of Systems: As per HPI otherwise 10 point review of systems negative.    Past Medical History:  Diagnosis Date  . Asthma   . Coronary artery disease   . Diabetes mellitus without  complication ([place]) 9983  . Hypertension     Past Surgical History:  Procedure Laterality Date  . BREAST EXCISIONAL BIOPSY Left 1980's   neg  . BREAST MASS EXCISION Left    age 78"s, benign  . CESAREAN SECTION    . CHOLECYSTECTOMY  2009     reports that she has been smoking cigarettes. She has a 34.50 pack-year smoking history. She has never used smokeless tobacco. She reports that she does not drink alcohol. No history on file for drug use.  Allergies  Allergen Reactions  . Ibuprofen Other (See Comments)    STOMACHACHE    Family History  Problem Relation Age of Onset  . Breast cancer Sister 63  . Breast cancer Paternal Aunt      Prior to Admission medications   Medication Sig Start Date End Date Taking? Authorizing Provider  acetaminophen (TYLENOL) 500 MG tablet Take 1,000 mg by mouth every 8 (eight) hours as needed for moderate pain.   Yes [provider]  amLODipine (NORVASC) 2.5 MG tablet Take 2.5 mg by mouth daily. 03/14/20  Yes [provider]  aspirin 325 MG tablet Take 325 mg by mouth daily.   Yes [provider]  diphenhydrAMINE (BENADRYL) 25 MG tablet Take 25 mg by mouth 2 (two) times daily as needed for itching or allergies.   Yes [provider]  FLUoxetine (PROZAC) 20 MG capsule Take 20 mg by mouth Nightly. Take along with 40 mg capsule at bedtime   Yes [provider]  FLUoxetine (PROZAC) 40 MG capsule Take 40 mg by mouth daily. Take with 20 mg capsule at bedtime  Yes [provider]  fluticasone (FLOVENT HFA) 44 MCG/ACT inhaler Inhale 1 puff into the lungs 2 (two) times daily.   Yes [provider]  gabapentin (NEURONTIN) 100 MG capsule Take 300 mg by mouth every evening.    Yes [provider]  glipiZIDE (GLUCOTROL) 10 MG tablet Take 10 mg by mouth daily with lunch.    Yes [provider]  insulin aspart protamine- aspart (NOVOLOG MIX 70/30) (70-30) 100 UNIT/ML injection Inject  25-75 Units into the skin 2 (two) times daily with a meal. 75 units in the morning and 25 units at night   Yes [provider]  lisinopril-hydrochlorothiazide (PRINZIDE,ZESTORETIC) 20-25 MG tablet Take 1 tablet by mouth daily.   Yes [provider]  metoprolol tartrate (LOPRESSOR) 25 MG tablet Take 1 tablet (25 mg total) by mouth 2 (two) times daily. 11/30/18  Yes Dustin Flock, MD  omeprazole (PRILOSEC) 40 MG capsule Take 40 mg by mouth daily.   Yes [provider]  predniSONE (STERAPRED UNI-PAK 21 TAB) 10 MG (21) TBPK tablet Start at 60mg  taper by 10mg  until complete 11/30/18  Yes Dustin Flock, MD  albuterol (PROVENTIL HFA;VENTOLIN HFA) 108 (90 Base) MCG/ACT inhaler Inhale 2 puffs into the lungs every 4 (four) hours as needed for wheezing or shortness of breath.    [provider]  amitriptyline (ELAVIL) 100 MG tablet Take 50 mg by mouth at bedtime as needed for sleep.    [provider]  azelastine (OPTIVAR) 0.05 % ophthalmic solution Place 1 drop into both eyes daily. Patient not taking: Reported on 04/28/2020 04/12/17   [provider]  guaiFENesin-dextromethorphan (ROBITUSSIN DM) 100-10 MG/5ML syrup Take 15 mLs by mouth every 4 (four) hours as needed for cough. Patient not taking: Reported on 04/28/2020 11/30/18   Dustin Flock, MD  ipratropium-albuterol (DUONEB) 0.5-2.5 (3) MG/3ML SOLN Take 3 mLs by nebulization 3 (three) times daily. 11/30/18   Dustin Flock, MD  mupirocin ointment (BACTROBAN) 2 % Apply 1 application topically daily.    [provider]    Physical Exam: Vitals:   04/28/20 0415 04/28/20 0416 04/28/20 1130 04/28/20 1330  BP: 139/77  (!) 163/79 (!) 151/81  Pulse: (!) 104  87 92  Resp: (!) 22  14 18   Temp: 98.2 F (36.8 C)     TempSrc: Oral     SpO2: 100%  96% 96%  Weight:  90.7 kg    Height:  5' (1.524 m)       Vitals:   04/28/20 0415 04/28/20 0416 04/28/20 1130 04/28/20 1330  BP: 139/77  (!) 163/79  (!) 151/81  Pulse: (!) 104  87 92  Resp: (!) 22  14 18   Temp: 98.2 F (36.8 C)     TempSrc: Oral     SpO2: 100%  96% 96%  Weight:  90.7 kg    Height:  5' (1.524 m)      Constitutional: NAD, alert and oriented x 3 Eyes: PERRL, lids and conjunctivae normal ENMT: Mucous membranes are dry Neck: normal, supple, no masses, no thyromegaly Respiratory: clear to auscultation bilaterally, no wheezing, no crackles. Normal respiratory effort. No accessory muscle use.  Cardiovascular: Regular rate and rhythm, no murmurs / rubs / gallops. No extremity edema. 2+ pedal pulses. No carotid bruits.  Abdomen: tenderness in the epigastrium, no masses palpated. No hepatosplenomegaly. Bowel sounds positive.  Musculoskeletal: no clubbing / cyanosis. No joint deformity upper and lower extremities.  Skin: no rashes, lesions, ulcers.  Neurologic: No gross  focal neurologic deficit. Psychiatric: Normal mood and affect.   Labs on Admission: I have personally reviewed following labs and imaging studies  CBC: Recent Labs  Lab 04/28/20 0423  WBC 8.9  HGB 14.9  HCT 42.6  MCV 91.6  PLT 585*   Basic Metabolic Panel: Recent Labs  Lab 04/28/20 0423  NA 131*  K 4.3  CL 89*  CO2 27  GLUCOSE 387*  BUN 21  CREATININE 1.18*  CALCIUM 10.4*   GFR: Estimated Creatinine Clearance: 47.7 mL/min (A) (by C-G formula based on SCr of 1.18 mg/dL (H)). Liver Function Tests: Recent Labs  Lab 04/28/20 0423  AST 17  ALT 14  ALKPHOS 89  BILITOT 0.6  PROT 8.2*  ALBUMIN 4.3   Recent Labs  Lab 04/28/20 0423  LIPASE 69*   No results for input(s): AMMONIA in the last 168 hours. Coagulation Profile: No results for input(s): INR, PROTIME in the last 168 hours. Cardiac Enzymes: No results for input(s): CKTOTAL, CKMB, CKMBINDEX, TROPONINI in the last 168 hours. BNP (last 3 results) No results for input(s): PROBNP in the last 8760 hours. HbA1C: No results for input(s): HGBA1C in the last 72 hours. CBG: No  results for input(s): GLUCAP in the last 168 hours. Lipid Profile: No results for input(s): CHOL, HDL, LDLCALC, TRIG, CHOLHDL, LDLDIRECT in the last 72 hours. Thyroid Function Tests: No results for input(s): TSH, T4TOTAL, FREET4, T3FREE, THYROIDAB in the last 72 hours. Anemia Panel: No results for input(s): VITAMINB12, FOLATE, FERRITIN, TIBC, IRON, RETICCTPCT in the last 72 hours. Urine analysis:    Component Value Date/Time   COLORURINE YELLOW (A) 04/28/2020 0938   APPEARANCEUR CLEAR (A) 04/28/2020 0938   APPEARANCEUR Clear 07/14/2013 2143   LABSPEC 1.039 (H) 04/28/2020 0938   LABSPEC 1.025 07/14/2013 2143   PHURINE 5.0 04/28/2020 0938   GLUCOSEU >=500 (A) 04/28/2020 0938   GLUCOSEU >=500 07/14/2013 2143   HGBUR NEGATIVE 04/28/2020 0938   BILIRUBINUR NEGATIVE 04/28/2020 0938   BILIRUBINUR Negative 07/14/2013 2143   KETONESUR 5 (A) 04/28/2020 0938   PROTEINUR NEGATIVE 04/28/2020 0938   NITRITE NEGATIVE 04/28/2020 0938   LEUKOCYTESUR NEGATIVE 04/28/2020 0938   LEUKOCYTESUR 1+ 07/14/2013 2143    Radiological Exams on Admission: DG Chest 2 View  Result Date: 04/28/2020 CLINICAL DATA:  Chest pain EXAM: CHEST - 2 VIEW COMPARISON:  05/24/2019 FINDINGS: Normal heart size and mediastinal contours. No acute infiltrate or edema. Nodular calcification at the left base is in the breast by CT. No effusion or pneumothorax. No acute osseous findings. IMPRESSION: No active cardiopulmonary disease. Electronically Signed   By: Monte Fantasia M.D.   On: 04/28/2020 04:52   CT ABDOMEN PELVIS W CONTRAST  Result Date: 04/28/2020 CLINICAL DATA:  Abdominal pain for 4 days.  Elevated serum lipase EXAM: CT ABDOMEN AND PELVIS WITH CONTRAST TECHNIQUE: Multidetector CT imaging of the abdomen and pelvis was performed using the standard protocol following bolus administration of intravenous contrast. CONTRAST:  145mL OMNIPAQUE IOHEXOL 300 MG/ML  SOLN COMPARISON:  08/28/2013 FINDINGS: Lower chest: No acute  abnormality. Hepatobiliary: No focal liver abnormality is seen. Status post cholecystectomy. No biliary dilatation. Pancreas: Subtle peripancreatic fat stranding anterior to the pancreatic body (series 2, image 25). No pancreatic ductal dilatation. Unremarkable parenchymal enhancement without evidence of necrosis. No peripancreatic fluid collection. Spleen: Normal in size without focal abnormality. Adrenals/Urinary Tract: 1.5 cm indeterminate density left adrenal gland nodule, increased from prior. 1.0 cm indeterminate right adrenal gland nodule, similar in appearance to prior. Focal area of  cortical scarring at the superior pole the right kidney. Kidneys have otherwise symmetric enhancement. No renal mass, stone, or hydronephrosis. Urinary bladder is unremarkable for the degree of distension. Stomach/Bowel: Stomach is within normal limits. Appendix appears normal. No evidence of bowel wall thickening, distention, or inflammatory changes. Vascular/Lymphatic: Aortoiliac atherosclerosis without aneurysm. Abdominal aorta is diminutive in caliber, unchanged from prior. No abdominopelvic lymphadenopathy. Reproductive: Unremarkable uterus with bilateral tubal ligation clips. No adnexal masses. Other: No free-air, free-fluid, or abscess. No abdominal wall hernia. Musculoskeletal: Ill-defined area of soft tissue density within the left lower anterior abdominal wall, which was seen on multiple previous studies and may reflect prior injection related changes. No acute osseous abnormality. IMPRESSION: 1. Subtle peripancreatic fat stranding anterior to the pancreatic body suggestive of acute pancreatitis. No peripancreatic fluid collection or evidence of necrosis. 2. Bilateral indeterminate adrenal gland nodules, measuring up to 1.5 cm on the left and 1.0 cm on the right. Further evaluation with a nonemergent adrenal protocol CT is recommended. 3. Aortic atherosclerosis. (ICD10-I70.0). Electronically Signed   By: Davina Poke D.O.   On: 04/28/2020 08:30    EKG: Independently reviewed.  Sinus rhythm Incomplete right bundle branch block  Assessment/Plan Principal Problem:   Acute pancreatitis Active Problems:   Hypertension   Diabetes mellitus with hyperglycemia (HCC)   Dehydration   Depression      Acute pancreatitis Most likely medication induced Patient denies alcohol use and is status post cholecystectomy. Patient was on hydrochlorothiazide/lisinopril combination which is currently on hold Supportive care Pain control, IV fluid hydration, IV PPI and antiemetics Obtain lipid panel   Diabetes mellitus with hyperglycemia Hold oral hypoglycemic agents IV fluid hydration Place patient on sliding scale coverage with insulin   Dehydration Secondary to poor oral intake Patient's baseline serum creatinine is 0.75 on admission today it is 1.18 Repeat renal parameters in a.m. following IV fluid hydration   Depression Continue Prozac   Hypertension Continue metoprolol and amlodipine   COPD  Continue as needed bronchodilator therapy as well as inhaled steroids  DVT prophylaxis: Lovenox Code Status: Full code Family Communication: Greater than 50% of time was spent discussing plan of care with patient at the bedside.  She verbalizes understanding and agrees with the plan. Disposition Plan: Back to previous home environment Consults called: None    Kelsen Celona MD [facility]     04/28/2020, 2:01 PM

## 2020-04-28 NOTE — ED Notes (Signed)
Answered pts call bell. This tech and Mitch, RN repositioned pt in bed and assisted pt to use bedpan. Pt able to void.

## 2020-04-29 DIAGNOSIS — I10 Essential (primary) hypertension: Secondary | ICD-10-CM

## 2020-04-29 LAB — GLUCOSE, CAPILLARY
Glucose-Capillary: 100 mg/dL — ABNORMAL HIGH (ref 70–99)
Glucose-Capillary: 125 mg/dL — ABNORMAL HIGH (ref 70–99)
Glucose-Capillary: 139 mg/dL — ABNORMAL HIGH (ref 70–99)
Glucose-Capillary: 227 mg/dL — ABNORMAL HIGH (ref 70–99)
Glucose-Capillary: 358 mg/dL — ABNORMAL HIGH (ref 70–99)
Glucose-Capillary: 97 mg/dL (ref 70–99)

## 2020-04-29 LAB — LIPID PANEL
Cholesterol: 226 mg/dL — ABNORMAL HIGH (ref 0–200)
HDL: 32 mg/dL — ABNORMAL LOW (ref >40)
LDL Cholesterol: 136 mg/dL — ABNORMAL HIGH (ref 0–99)
Total CHOL/HDL Ratio: 7.1 RATIO
Triglycerides: 292 mg/dL — ABNORMAL HIGH (ref <150)
VLDL: 58 mg/dL — ABNORMAL HIGH (ref 0–40)

## 2020-04-29 LAB — BASIC METABOLIC PANEL
Anion gap: 7 (ref 5–15)
BUN: 13 mg/dL (ref 8–23)
CO2: 33 mmol/L — ABNORMAL HIGH (ref 22–32)
Calcium: 9 mg/dL (ref 8.9–10.3)
Chloride: 97 mmol/L — ABNORMAL LOW (ref 98–111)
Creatinine, Ser: 0.75 mg/dL (ref 0.44–1.00)
GFR calc Af Amer: >60 mL/min (ref >60)
GFR calc non Af Amer: >60 mL/min (ref >60)
Glucose, Bld: 115 mg/dL — ABNORMAL HIGH (ref 70–99)
Potassium: 3.8 mmol/L (ref 3.5–5.1)
Sodium: 137 mmol/L (ref 135–145)

## 2020-04-29 LAB — CBC
HCT: 38.3 % (ref 36.0–46.0)
Hemoglobin: 13.2 g/dL (ref 12.0–15.0)
MCH: 31.9 pg (ref 26.0–34.0)
MCHC: 34.5 g/dL (ref 30.0–36.0)
MCV: 92.5 fL (ref 80.0–100.0)
Platelets: 409 10*3/uL — ABNORMAL HIGH (ref 150–400)
RBC: 4.14 MIL/uL (ref 3.87–5.11)
RDW: 13.1 % (ref 11.5–15.5)
WBC: 5.7 10*3/uL (ref 4.0–10.5)
nRBC: 0 % (ref 0.0–0.2)

## 2020-04-29 LAB — HIV ANTIBODY (ROUTINE TESTING W REFLEX): HIV Screen 4th Generation wRfx: NONREACTIVE

## 2020-04-29 MED ORDER — ACETAMINOPHEN 500 MG PO TABS
500.0000 mg | ORAL_TABLET | Freq: Three times a day (TID) | ORAL | Status: DC
  Administered 2020-04-29 – 2020-05-01 (×6): 500 mg via ORAL
  Filled 2020-04-29 (×6): qty 1

## 2020-04-29 MED ORDER — MORPHINE SULFATE (PF) 4 MG/ML IV SOLN
3.0000 mg | INTRAVENOUS | Status: DC | PRN
  Administered 2020-04-29 – 2020-04-30 (×4): 3 mg via INTRAVENOUS
  Filled 2020-04-29 (×5): qty 1

## 2020-04-29 MED ORDER — IPRATROPIUM-ALBUTEROL 0.5-2.5 (3) MG/3ML IN SOLN: 3.0000 mL | RESPIRATORY_TRACT | Status: DC | PRN

## 2020-04-29 MED ORDER — LACTATED RINGERS IV SOLN: INTRAVENOUS | Status: DC

## 2020-04-29 MED ORDER — ACETAMINOPHEN 500 MG PO TABS: 500.0000 mg | ORAL_TABLET | Freq: Four times a day (QID) | ORAL | Status: DC | PRN

## 2020-04-29 MED ORDER — INSULIN ASPART PROT & ASPART (70-30 MIX) 100 UNIT/ML ~~LOC~~ SUSP
15.0000 [IU] | Freq: Two times a day (BID) | SUBCUTANEOUS | Status: DC
  Administered 2020-04-29 – 2020-04-30 (×2): 15 [IU] via SUBCUTANEOUS
  Filled 2020-04-29 (×2): qty 10

## 2020-04-29 NOTE — Plan of Care (Signed)

## 2020-04-29 NOTE — Progress Notes (Signed)
PROGRESS NOTE    Kristie Olson  FXT:[id number]  DOB: December 13, 1955  PCP: [facility], [place] date:04/28/2020 64 y.o. female with medical history significant for HTN, CAD, DM-insulin dependent and asthma presents to the emergency room for evaluation of a 1 week history of burning epigastric pain radiating to her back associated with nausea but no vomiting. She denies alcohol use and is status post cholecystectomy.  ED Course: Afebrile,mildly elevated lipase level at 69 and CT scan shows peripancreatic stranding suggestive of acute pancreatitis.Bilateral indeterminate adrenal gland nodules, measuring up to 1.5 cm on the left and 1.0 cm on the right also reported on CT. Further evaluation with a nonemergent adrenal protocol CT is recommended. Hospital course: Patient admitted to [facility] for further evaluation and management of acute pancreatitis-possibly drug induced . HCTZ held.   Subjective:  Patient reports ongoing intermittent abdominal pain-epigastric, 8/10 partially relieved with morphine.  Tolerating liquid diet okay but does not feel ready to advance diet.  Feels nauseous with poor appetite.  Objective: Vitals:   04/28/20 1928 04/28/20 2016 04/29/20 0002 04/29/20 0437  BP: (!) 144/70  138/64 (!) 152/73  Pulse: 89  81 80  Resp: 20  17 16   Temp: 98.1 F (36.7 C)  98.6 F (37 C) 98.1 F (36.7 C)  TempSrc:   Oral   SpO2: 97% 95% 95% 98%  Weight:      Height:        Intake/Output Summary (Last 24 hours) at 04/29/2020 0717 Last data filed at 04/29/2020 0500 Gross per 24 hour  Intake 2059.1 ml  Output --  Net 2059.1 ml   Filed Weights   04/28/20 0416  Weight: 90.7 kg    Physical Examination:  General exam: Appears calm and comfortable  Respiratory system: Clear to auscultation. Respiratory effort normal. Cardiovascular system: S1 & S2 heard, RRR. No JVD, murmurs, rubs, gallops or clicks. No pedal edema. Gastrointestinal system: Abdomen is obese, has  epigastric tenderness, soft with no rebound. Normal bowel sounds heard. Central nervous system: Alert and oriented. No new focal neurological deficits. Extremities: No contractures, edema or joint deformities.  Skin: No rashes, lesions or ulcers Psychiatry: Judgement and insight appear normal. Mood & affect appropriate.   Data Reviewed: I have personally reviewed following labs and imaging studies  CBC: Recent Labs  Lab 04/28/20 0423 04/29/20 0533  WBC 8.9 5.7  HGB 14.9 13.2  HCT 42.6 38.3  MCV 91.6 92.5  PLT 483* 299*   Basic Metabolic Panel: Recent Labs  Lab 04/28/20 0423 04/29/20 0533  NA 131* 137  K 4.3 3.8  CL 89* 97*  CO2 27 33*  GLUCOSE 387* 115*  BUN 21 13  CREATININE 1.18* 0.75  CALCIUM 10.4* 9.0   GFR: Estimated Creatinine Clearance: 70.4 mL/min (by C-G formula based on SCr of 0.75 mg/dL). Liver Function Tests: Recent Labs  Lab 04/28/20 0423  AST 17  ALT 14  ALKPHOS 89  BILITOT 0.6  PROT 8.2*  ALBUMIN 4.3   Recent Labs  Lab 04/28/20 0423  LIPASE 69*   No results for input(s): AMMONIA in the last 168 hours. Coagulation Profile: No results for input(s): INR, PROTIME in the last 168 hours. Cardiac Enzymes: No results for input(s): CKTOTAL, CKMB, CKMBINDEX, TROPONINI in the last 168 hours. BNP (last 3 results) No results for input(s): PROBNP in the last 8760 hours. HbA1C: Recent Labs    04/28/20 0423  HGBA1C 10.8*   CBG: Recent Labs  Lab 04/28/20 1631 04/28/20 2033  04/29/20 0053 04/29/20 0432  GLUCAP 289* 243* 227* 100*   Lipid Profile: Recent Labs    04/29/20 0533  CHOL 226*  HDL 32*  LDLCALC 136*  TRIG 292*  CHOLHDL 7.1   Thyroid Function Tests: No results for input(s): TSH, T4TOTAL, FREET4, T3FREE, THYROIDAB in the last 72 hours. Anemia Panel: No results for input(s): VITAMINB12, FOLATE, FERRITIN, TIBC, IRON, RETICCTPCT in the last 72 hours. Sepsis Labs: No results for input(s): PROCALCITON, LATICACIDVEN in the last 168  hours.  Recent Results (from the past 240 hour(s))  SARS Coronavirus 2 by RT PCR (hospital order, performed in [facility] hospital lab) Nasopharyngeal Nasopharyngeal Swab     Status: None   Collection Time: 04/28/20  9:48 AM   Specimen: Nasopharyngeal Swab  Result Value Ref Range Status   SARS Coronavirus 2 NEGATIVE NEGATIVE Final    Comment: (NOTE) SARS-CoV-2 target nucleic acids are NOT DETECTED.  The SARS-CoV-2 RNA is generally detectable in upper and lower respiratory specimens during the acute phase of infection. The lowest concentration of SARS-CoV-2 viral copies this assay can detect is 250 copies / mL. A negative result does not preclude SARS-CoV-2 infection and should not be used as the sole basis for treatment or other patient management decisions.  A negative result may occur with improper specimen collection / handling, submission of specimen other than nasopharyngeal swab, presence of viral mutation(s) within the areas targeted by this assay, and inadequate number of viral copies (<250 copies / mL). A negative result must be combined with clinical observations, patient history, and epidemiological information.  Fact Sheet for Patients:   StrictlyIdeas.no  Fact Sheet for Healthcare Providers: BankingDealers.co.za  This test is not yet approved or  cleared by the [place] FDA and has been authorized for detection and/or diagnosis of SARS-CoV-2 by FDA under an Emergency Use Authorization (EUA).  This EUA will remain in effect (meaning this test can be used) for the duration of the COVID-19 declaration under Section 564(b)(1) of the Act, 21 U.S.C. section 360bbb-3(b)(1), unless the authorization is terminated or revoked sooner.  Performed at [facility], [address], [place], [place] [address]       Radiology Studies: DG Chest 2 View  Result Date: 04/28/2020 CLINICAL DATA:  Chest pain EXAM: CHEST  - 2 VIEW COMPARISON:  05/24/2019 FINDINGS: Normal heart size and mediastinal contours. No acute infiltrate or edema. Nodular calcification at the left base is in the breast by CT. No effusion or pneumothorax. No acute osseous findings. IMPRESSION: No active cardiopulmonary disease. Electronically Signed   By: Monte Fantasia M.D.   On: 04/28/2020 04:52   CT ABDOMEN PELVIS W CONTRAST  Result Date: 04/28/2020 CLINICAL DATA:  Abdominal pain for 4 days.  Elevated serum lipase EXAM: CT ABDOMEN AND PELVIS WITH CONTRAST TECHNIQUE: Multidetector CT imaging of the abdomen and pelvis was performed using the standard protocol following bolus administration of intravenous contrast. CONTRAST:  171mL OMNIPAQUE IOHEXOL 300 MG/ML  SOLN COMPARISON:  08/28/2013 FINDINGS: Lower chest: No acute abnormality. Hepatobiliary: No focal liver abnormality is seen. Status post cholecystectomy. No biliary dilatation. Pancreas: Subtle peripancreatic fat stranding anterior to the pancreatic body (series 2, image 25). No pancreatic ductal dilatation. Unremarkable parenchymal enhancement without evidence of necrosis. No peripancreatic fluid collection. Spleen: Normal in size without focal abnormality. Adrenals/Urinary Tract: 1.5 cm indeterminate density left adrenal gland nodule, increased from prior. 1.0 cm indeterminate right adrenal gland nodule, similar in appearance to prior. Focal area of cortical scarring at the  superior pole the right kidney. Kidneys have otherwise symmetric enhancement. No renal mass, stone, or hydronephrosis. Urinary bladder is unremarkable for the degree of distension. Stomach/Bowel: Stomach is within normal limits. Appendix appears normal. No evidence of bowel wall thickening, distention, or inflammatory changes. Vascular/Lymphatic: Aortoiliac atherosclerosis without aneurysm. Abdominal aorta is diminutive in caliber, unchanged from prior. No abdominopelvic lymphadenopathy. Reproductive: Unremarkable uterus with  bilateral tubal ligation clips. No adnexal masses. Other: No free-air, free-fluid, or abscess. No abdominal wall hernia. Musculoskeletal: Ill-defined area of soft tissue density within the left lower anterior abdominal wall, which was seen on multiple previous studies and may reflect prior injection related changes. No acute osseous abnormality. IMPRESSION: 1. Subtle peripancreatic fat stranding anterior to the pancreatic body suggestive of acute pancreatitis. No peripancreatic fluid collection or evidence of necrosis. 2. Bilateral indeterminate adrenal gland nodules, measuring up to 1.5 cm on the left and 1.0 cm on the right. Further evaluation with a nonemergent adrenal protocol CT is recommended. 3. Aortic atherosclerosis. (ICD10-I70.0). Electronically Signed   By: Davina Poke D.O.   On: 04/28/2020 08:30        Scheduled Meds: . amLODipine  2.5 mg Oral Daily  . aspirin  325 mg Oral Daily  . budesonide (PULMICORT) nebulizer solution  0.25 mg Nebulization BID  . enoxaparin (LOVENOX) injection  40 mg Subcutaneous Q24H  . FLUoxetine  40 mg Oral Daily  . insulin aspart  0-20 Units Subcutaneous Q4H  . ipratropium-albuterol  3 mL Nebulization TID  . metoprolol tartrate  25 mg Oral BID  . pantoprazole (PROTONIX) IV  40 mg Intravenous Q24H   Continuous Infusions: . sodium chloride 100 mL/hr at 04/29/20 0500     Assessment/Plan:  1. Acute pancreatitis: likely drug induced. No h/o alcohol use and is s/p cholecystectomy for previous history of cholecystitis (patient denies cholelithiasis history). Will defer autoimmune w/u for now. Continue supportive management with IVF, analgesics, antiemetics. Change IVF to LR (studies show better results) and clear liquid diet today, advance diet as tolerated.  Increase morphine from 2 mg to 3 mg every 3 hours for better pain control.  2. HTN: HCTZ/lisinopril held on admission in concern for #1. Now on norvasc/metoprolol.   3. Diabetes mellitus: on SSI.  On glipizide and 70/30 at home. Resume at lower dose and titrate as diet advanced.   4. CAD: on asa, metoprolol  5. Depression; cont prozac  6. COPD: resume home regimen   DVT prophylaxis: lovenox Code Status: Full code Family / Patient Communication: Discussed with patient in detail and all questions answered to satisfaction. Disposition Plan:   Status is: Inpatient  Remains inpatient appropriate because:IV treatments appropriate due to intensity of illness or inability to take PO   Dispo: The patient is from: Home              Anticipated d/c is to: Home              Anticipated d/c date is: 2 days              Patient currently is not medically stable to d/c.     LOS: 1 day    Time spent:     Guilford Shi, MD [facility] Pager in [place]  If 7PM-7AM, please contact night-coverage [email or web address] 04/29/2020, 7:17 AM

## 2020-04-30 DIAGNOSIS — E781 Pure hyperglyceridemia: Secondary | ICD-10-CM

## 2020-04-30 LAB — GLUCOSE, CAPILLARY
Glucose-Capillary: 133 mg/dL — ABNORMAL HIGH (ref 70–99)
Glucose-Capillary: 153 mg/dL — ABNORMAL HIGH (ref 70–99)
Glucose-Capillary: 173 mg/dL — ABNORMAL HIGH (ref 70–99)
Glucose-Capillary: 237 mg/dL — ABNORMAL HIGH (ref 70–99)
Glucose-Capillary: 239 mg/dL — ABNORMAL HIGH (ref 70–99)
Glucose-Capillary: 305 mg/dL — ABNORMAL HIGH (ref 70–99)
Glucose-Capillary: 86 mg/dL (ref 70–99)

## 2020-04-30 MED ORDER — OXYCODONE HCL 5 MG PO TABS
5.0000 mg | ORAL_TABLET | Freq: Four times a day (QID) | ORAL | Status: DC | PRN
  Administered 2020-05-01 (×2): 5 mg via ORAL
  Filled 2020-04-30 (×2): qty 1

## 2020-04-30 MED ORDER — PRAVASTATIN SODIUM 20 MG PO TABS
20.0000 mg | ORAL_TABLET | Freq: Every day | ORAL | Status: DC
  Administered 2020-04-30 – 2020-05-01 (×2): 20 mg via ORAL
  Filled 2020-04-30: qty 1

## 2020-04-30 MED ORDER — INSULIN ASPART PROT & ASPART (70-30 MIX) 100 UNIT/ML ~~LOC~~ SUSP
20.0000 [IU] | Freq: Two times a day (BID) | SUBCUTANEOUS | Status: DC
  Administered 2020-04-30 – 2020-05-01 (×2): 20 [IU] via SUBCUTANEOUS
  Filled 2020-04-30 (×2): qty 10

## 2020-04-30 NOTE — Progress Notes (Signed)
PROGRESS NOTE    Kristie Olson  TMH:[id number]  DOB: 04/09/1956  PCP: [facility] [place] date:04/28/2020 64 y.o. female with medical history significant for HTN, CAD, DM-insulin dependent and asthma presents to the emergency room for evaluation of a 1 week history of burning epigastric pain radiating to her back associated with nausea but no vomiting. She denies alcohol use and is status post cholecystectomy.  ED Course: Afebrile,mildly elevated lipase level at 69 and CT scan shows peripancreatic stranding suggestive of acute pancreatitis.Bilateral indeterminate adrenal gland nodules, measuring up to 1.5 cm on the left and 1.0 cm on the right also reported on CT. Further evaluation with a nonemergent adrenal protocol CT is recommended. Hospital course: Patient admitted to [facility] for further evaluation and management of acute pancreatitis-possibly drug induced . HCTZ held.   Subjective: Patient reports feeling better now. Required morphine twice yesterday and once this morning.  Tolerating liquid diet okay   Objective: Vitals:   04/30/20 0335 04/30/20 0753 04/30/20 0754 04/30/20 1150  BP: 134/69 (!) 155/74 (!) 155/74 122/83  Pulse: 73 68 68 64  Resp: 16 16 16 18   Temp: 97.8 F (36.6 C) (!) 97.5 F (36.4 C) (!) 97.5 F (36.4 C) 97.9 F (36.6 C)  TempSrc: Oral Oral Oral Oral  SpO2: 95% 96%  97%  Weight:      Height:        Intake/Output Summary (Last 24 hours) at 04/30/2020 1344 Last data filed at 04/29/2020 1916 Gross per 24 hour  Intake 360 ml  Output --  Net 360 ml   Filed Weights   04/28/20 0416  Weight: 90.7 kg    Physical Examination:  General exam: Appears to be in good spirits Respiratory system: Clear to auscultation. Respiratory effort normal. Cardiovascular system: S1 & S2 heard, RRR. No JVD, murmurs, rubs, gallops or clicks. No pedal edema. Gastrointestinal system: Abdomen is obese, improving epigastric tenderness, soft with no rebound.  Normal bowel sounds heard. Central nervous system: Alert and oriented. No new focal neurological deficits. Extremities: No contractures, edema or joint deformities.  Skin: No rashes, lesions or ulcers Psychiatry: Judgement and insight appear normal. Mood & affect appropriate.   Data Reviewed: I have personally reviewed following labs and imaging studies  CBC: Recent Labs  Lab 04/28/20 0423 04/29/20 0533  WBC 8.9 5.7  HGB 14.9 13.2  HCT 42.6 38.3  MCV 91.6 92.5  PLT 483* 921*   Basic Metabolic Panel: Recent Labs  Lab 04/28/20 0423 04/29/20 0533  NA 131* 137  K 4.3 3.8  CL 89* 97*  CO2 27 33*  GLUCOSE 387* 115*  BUN 21 13  CREATININE 1.18* 0.75  CALCIUM 10.4* 9.0   GFR: Estimated Creatinine Clearance: 70.4 mL/min (by C-G formula based on SCr of 0.75 mg/dL). Liver Function Tests: Recent Labs  Lab 04/28/20 0423  AST 17  ALT 14  ALKPHOS 89  BILITOT 0.6  PROT 8.2*  ALBUMIN 4.3   Recent Labs  Lab 04/28/20 0423  LIPASE 69*   No results for input(s): AMMONIA in the last 168 hours. Coagulation Profile: No results for input(s): INR, PROTIME in the last 168 hours. Cardiac Enzymes: No results for input(s): CKTOTAL, CKMB, CKMBINDEX, TROPONINI in the last 168 hours. BNP (last 3 results) No results for input(s): PROBNP in the last 8760 hours. HbA1C: Recent Labs    04/28/20 0423  HGBA1C 10.8*   CBG: Recent Labs  Lab 04/29/20 2359 04/30/20 0333 04/30/20 0447 04/30/20 0750 04/30/20 1147  GLUCAP 86 133* 153* 173* 237*   Lipid Profile: Recent Labs    04/29/20 0533  CHOL 226*  HDL 32*  LDLCALC 136*  TRIG 292*  CHOLHDL 7.1   Thyroid Function Tests: No results for input(s): TSH, T4TOTAL, FREET4, T3FREE, THYROIDAB in the last 72 hours. Anemia Panel: No results for input(s): VITAMINB12, FOLATE, FERRITIN, TIBC, IRON, RETICCTPCT in the last 72 hours. Sepsis Labs: No results for input(s): PROCALCITON, LATICACIDVEN in the last 168 hours.  Recent Results  (from the past 240 hour(s))  SARS Coronavirus 2 by RT PCR (hospital order, performed in [facility] hospital lab) Nasopharyngeal Nasopharyngeal Swab     Status: None   Collection Time: 04/28/20  9:48 AM   Specimen: Nasopharyngeal Swab  Result Value Ref Range Status   SARS Coronavirus 2 NEGATIVE NEGATIVE Final    Comment: (NOTE) SARS-CoV-2 target nucleic acids are NOT DETECTED.  The SARS-CoV-2 RNA is generally detectable in upper and lower respiratory specimens during the acute phase of infection. The lowest concentration of SARS-CoV-2 viral copies this assay can detect is 250 copies / mL. A negative result does not preclude SARS-CoV-2 infection and should not be used as the sole basis for treatment or other patient management decisions.  A negative result may occur with improper specimen collection / handling, submission of specimen other than nasopharyngeal swab, presence of viral mutation(s) within the areas targeted by this assay, and inadequate number of viral copies (<250 copies / mL). A negative result must be combined with clinical observations, patient history, and epidemiological information.  Fact Sheet for Patients:   StrictlyIdeas.no  Fact Sheet for Healthcare Providers: BankingDealers.co.za  This test is not yet approved or  cleared by the [place] FDA and has been authorized for detection and/or diagnosis of SARS-CoV-2 by FDA under an Emergency Use Authorization (EUA).  This EUA will remain in effect (meaning this test can be used) for the duration of the COVID-19 declaration under Section 564(b)(1) of the Act, 21 U.S.C. section 360bbb-3(b)(1), unless the authorization is terminated or revoked sooner.  Performed at [facility], [address], [place], [place] [address]       Radiology Studies: No results found.      Scheduled Meds: . acetaminophen  500 mg Oral TID  . amLODipine  2.5 mg Oral  Daily  . aspirin  325 mg Oral Daily  . budesonide (PULMICORT) nebulizer solution  0.25 mg Nebulization BID  . enoxaparin (LOVENOX) injection  40 mg Subcutaneous Q24H  . FLUoxetine  40 mg Oral Daily  . insulin aspart  0-20 Units Subcutaneous Q4H  . insulin aspart protamine- aspart  20 Units Subcutaneous BID AC  . metoprolol tartrate  25 mg Oral BID  . pantoprazole (PROTONIX) IV  40 mg Intravenous Q24H   Continuous Infusions: . lactated ringers 100 mL/hr at 04/30/20 1003     Assessment/Plan:  1. Acute pancreatitis: likely drug induced. TG somewhat elevated at 296. No h/o alcohol use and is s/p cholecystectomy for previous history of cholecystitis (patient denies cholelithiasis history). Will defer autoimmune w/u for now. Continue supportive management with IVF, analgesics, antiemetics. Full liquid diet today, advance diet as tolerated.  Taper morphine to off, advance diet as tolerated. Add statins  2. HTN: HCTZ/lisinopril held on admission in concern for #1. Now on norvasc/metoprolol.   3. Diabetes mellitus: on SSI. On glipizide and 70/30 at home. Resume at lower dose and titrate as diet advanced.   4. CAD: on asa, metoprolol. Add statins  5.  Hyperlipidemia: Has elevated TG, T.col and LDL as above with low HDL. Will add statins. Advised low fat diet and should follow up PCP for repeat labs in 3 months.   6. COPD: resume home regimen  7.Depression; cont prozac  DVT prophylaxis: lovenox Code Status: Full code Family / Patient Communication: Discussed with patient in detail and all questions answered to satisfaction. Disposition Plan:   Status is: Inpatient  Remains inpatient appropriate because:IV treatments appropriate due to intensity of illness or inability to take PO   Dispo: The patient is from: Home              Anticipated d/c is to: Home              Anticipated d/c date is: 2 days              Patient currently is not medically stable to d/c.     LOS: 2 days     Time spent:     Guilford Shi, MD [facility] Pager in [place]  If 7PM-7AM, please contact night-coverage [email or web address] 04/30/2020, 1:44 PM

## 2020-05-01 LAB — GLUCOSE, CAPILLARY
Glucose-Capillary: 188 mg/dL — ABNORMAL HIGH (ref 70–99)
Glucose-Capillary: 285 mg/dL — ABNORMAL HIGH (ref 70–99)
Glucose-Capillary: 82 mg/dL (ref 70–99)
Glucose-Capillary: 85 mg/dL (ref 70–99)

## 2020-05-01 MED ORDER — ONDANSETRON HCL 4 MG PO TABS: 4.0000 mg | ORAL_TABLET | Freq: Four times a day (QID) | ORAL | 0 refills | Status: DC | PRN

## 2020-05-01 MED ORDER — PANTOPRAZOLE SODIUM 40 MG PO TBEC
40.0000 mg | DELAYED_RELEASE_TABLET | Freq: Every day | ORAL | Status: DC
  Administered 2020-05-01: 40 mg via ORAL
  Filled 2020-05-01: qty 1

## 2020-05-01 MED ORDER — PANTOPRAZOLE SODIUM 40 MG PO TBEC
40.0000 mg | DELAYED_RELEASE_TABLET | Freq: Every day | ORAL | 1 refills | Status: DC
Start: 2020-05-01 — End: 2022-09-03

## 2020-05-01 MED ORDER — OXYCODONE HCL 5 MG PO TABS: 5.0000 mg | ORAL_TABLET | Freq: Four times a day (QID) | ORAL | 0 refills | Status: AC | PRN

## 2020-05-01 MED ORDER — PRAVASTATIN SODIUM 20 MG PO TABS: 20.0000 mg | ORAL_TABLET | Freq: Every day | ORAL | 1 refills | Status: DC

## 2020-05-01 MED ORDER — LISINOPRIL 20 MG PO TABS
10.0000 mg | ORAL_TABLET | Freq: Every day | ORAL | 11 refills | Status: DC
Start: 2020-05-01 — End: 2022-09-03

## 2020-05-01 NOTE — Discharge Summary (Signed)
Physician Discharge Summary  Kristie Olson LNL:[id number] DOB: June 12, 1956 DOA: 04/28/2020  PCP: [facility] [place] date: 04/28/2020 Discharge date: 05/01/2020 Consultations: None Admitted From: home Disposition: home  Discharge Diagnoses:  Principal Problem:   Acute pancreatitis Active Problems:   Hypertension   Diabetes mellitus with hyperglycemia ([place])   Dehydration   Depression   Hospital Course Summary: 64 y.o.femalewith medical history significant forHTN, CAD, DM-insulin dependent and asthma presents to the emergency room for evaluation of a 1 week history of burning epigastric pain radiating to her back associated with nausea but no vomiting. She denies alcohol use and is status post cholecystectomy.  ED Course: Afebrile,mildly elevated lipase level at 69 and CT scan shows peripancreatic stranding suggestive of acute pancreatitis.Bilateral indeterminate adrenal gland nodules, measuring up to 1.5 cm on the left and 1.0 cm on the right also reported on CT. Further evaluation with a nonemergent adrenal protocol CT is recommended. Hospital course: Patient admitted to [facility] for further evaluation and management of acute pancreatitis-possibly drug induced . HCTZ held.   1. Acute pancreatitis: likely drug induced. TG somewhat elevated at 296. No h/o alcohol use and is s/p cholecystectomy for previous history of cholecystitis (patient denies cholelithiasis history).  Patient improved with supportive management with IVF, analgesics, antiemetics. Advanced diet as tolerated,tapered IV morphine to off, added statins. Advised low fat diet,   2. HTN: HCTZ/lisinopril held on admission in concern for #1. Continued on norvasc/metoprolol. Resumed lisinopril for adequate BP control.   3. Diabetes mellitus: on SSI. On glipizide and 70/30 at home. Resume at lower dose and titrated as diet advanced. May resume home regimen on discharge  4. CAD: on asa, metoprolol. Added  statins  5.  Hyperlipidemia: Has elevated TG, T.col and LDL as above with low HDL. Will add statins. Advised low fat diet and should follow up PCP for repeat labs in 3 months.   6. COPD: resume home regimen  7.Depression; cont prozac    Discharge Exam:   Vitals:   05/01/20 0430 05/01/20 0742 05/01/20 0813 05/01/20 1135  BP: (!) 144/68  (!) 146/64 127/83  Pulse: 64  66 68  Resp: 19  14 18   Temp: 98.1 F (36.7 C)  98.1 F (36.7 C) 98.5 F (36.9 C)  TempSrc:   Oral Oral  SpO2: 100% 98% 98% 100%  Weight:      Height:        General: Pt is alert, awake, not in acute distress Cardiovascular: RRR, S1/S2 +, no rubs, no gallops Respiratory: CTA bilaterally, no wheezing, no rhonchi Abdominal: Soft, NT, ND, bowel sounds + Extremities: no edema, no cyanosis  Discharge Condition:Stable CODE STATUS: FULL  Diet recommendation: low fat diabetic diet Recommendations for Outpatient Follow-up:  1. Follow up with PCP: 1 week 2. Follow up with consultants:  3. Please obtain follow up labs including: Lipid profile in 3 months  Home Health services upon discharge:  Equipment/Devices upon discharge:   Discharge Instructions:  Discharge Instructions    Call MD for:  difficulty breathing, headache or visual disturbances   Complete by: As directed    Call MD for:  extreme fatigue   Complete by: As directed    Call MD for:  persistant dizziness or light-headedness   Complete by: As directed    Call MD for:  persistant nausea and vomiting   Complete by: As directed    Call MD for:  severe uncontrolled pain   Complete by: As directed  Call MD for:  temperature >100.4   Complete by: As directed    Diet - low sodium heart healthy   Complete by: As directed    Increase activity slowly   Complete by: As directed      Allergies as of 05/01/2020      Reactions   Ibuprofen Other (See Comments)   STOMACHACHE      Medication List    STOP taking these medications    guaiFENesin-dextromethorphan 100-10 MG/5ML syrup Commonly known as: ROBITUSSIN DM   lisinopril-hydrochlorothiazide 20-25 MG tablet Commonly known as: ZESTORETIC   predniSONE 10 MG (21) Tbpk tablet Commonly known as: STERAPRED UNI-PAK 21 TAB     TAKE these medications   acetaminophen 500 MG tablet Commonly known as: TYLENOL Take 1,000 mg by mouth every 8 (eight) hours as needed for moderate pain.   albuterol 108 (90 Base) MCG/ACT inhaler Commonly known as: VENTOLIN HFA Inhale 2 puffs into the lungs every 4 (four) hours as needed for wheezing or shortness of breath.   amitriptyline 100 MG tablet Commonly known as: ELAVIL Take 50 mg by mouth at bedtime as needed for sleep.   amLODipine 2.5 MG tablet Commonly known as: NORVASC Take 2.5 mg by mouth daily.   aspirin 325 MG tablet Take 325 mg by mouth daily.   azelastine 0.05 % ophthalmic solution Commonly known as: OPTIVAR Place 1 drop into both eyes daily.   Bactroban 2 % Generic drug: mupirocin ointment Apply 1 application topically daily.   diphenhydrAMINE 25 MG tablet Commonly known as: BENADRYL Take 25 mg by mouth 2 (two) times daily as needed for itching or allergies.   FLUoxetine 40 MG capsule Commonly known as: PROZAC Take 40 mg by mouth daily. Take with 20 mg capsule at bedtime   FLUoxetine 20 MG capsule Commonly known as: PROZAC Take 20 mg by mouth Nightly. Take along with 40 mg capsule at bedtime   fluticasone 44 MCG/ACT inhaler Commonly known as: FLOVENT HFA Inhale 1 puff into the lungs 2 (two) times daily.   gabapentin 100 MG capsule Commonly known as: NEURONTIN Take 300 mg by mouth every evening.   glipiZIDE 10 MG tablet Commonly known as: GLUCOTROL Take 10 mg by mouth daily with lunch.   insulin aspart protamine- aspart (70-30) 100 UNIT/ML injection Commonly known as: NOVOLOG MIX 70/30 Inject 25-75 Units into the skin 2 (two) times daily with a meal. 75 units in the morning and 25 units at  night   ipratropium-albuterol 0.5-2.5 (3) MG/3ML Soln Commonly known as: DUONEB Take 3 mLs by nebulization 3 (three) times daily.   lisinopril 20 MG tablet Commonly known as: ZESTRIL Take 0.5 tablets (10 mg total) by mouth daily.   metoprolol tartrate 25 MG tablet Commonly known as: LOPRESSOR Take 1 tablet (25 mg total) by mouth 2 (two) times daily.   omeprazole 40 MG capsule Commonly known as: PRILOSEC Take 40 mg by mouth daily.   ondansetron 4 MG tablet Commonly known as: ZOFRAN Take 1 tablet (4 mg total) by mouth every 6 (six) hours as needed for nausea.   oxyCODONE 5 MG immediate release tablet Commonly known as: Oxy IR/ROXICODONE Take 1 tablet (5 mg total) by mouth every 6 (six) hours as needed for up to 3 days for severe pain.   pantoprazole 40 MG tablet Commonly known as: Protonix Take 1 tablet (40 mg total) by mouth daily.   pravastatin 20 MG tablet Commonly known as: PRAVACHOL Take 1 tablet (20 mg total) by  mouth daily.       Allergies  Allergen Reactions  . Ibuprofen Other (See Comments)    STOMACHACHE      The results of significant diagnostics from this hospitalization (including imaging, microbiology, ancillary and laboratory) are listed below for reference.    Labs: BNP (last 3 results) No results for input(s): BNP in the last 8760 hours. Basic Metabolic Panel: Recent Labs  Lab 04/28/20 0423 04/29/20 0533  NA 131* 137  K 4.3 3.8  CL 89* 97*  CO2 27 33*  GLUCOSE 387* 115*  BUN 21 13  CREATININE 1.18* 0.75  CALCIUM 10.4* 9.0   Liver Function Tests: Recent Labs  Lab 04/28/20 0423  AST 17  ALT 14  ALKPHOS 89  BILITOT 0.6  PROT 8.2*  ALBUMIN 4.3   Recent Labs  Lab 04/28/20 0423  LIPASE 69*   No results for input(s): AMMONIA in the last 168 hours. CBC: Recent Labs  Lab 04/28/20 0423 04/29/20 0533  WBC 8.9 5.7  HGB 14.9 13.2  HCT 42.6 38.3  MCV 91.6 92.5  PLT 483* 409*   Cardiac Enzymes: No results for input(s):  CKTOTAL, CKMB, CKMBINDEX, TROPONINI in the last 168 hours. BNP: Invalid input(s): POCBNP CBG: Recent Labs  Lab 04/30/20 1957 05/01/20 0006 05/01/20 0433 05/01/20 0815 05/01/20 1133  GLUCAP 239* 85 82 188* 285*   D-Dimer No results for input(s): DDIMER in the last 72 hours. Hgb A1c No results for input(s): HGBA1C in the last 72 hours. Lipid Profile Recent Labs    04/29/20 0533  CHOL 226*  HDL 32*  LDLCALC 136*  TRIG 292*  CHOLHDL 7.1   Thyroid function studies No results for input(s): TSH, T4TOTAL, T3FREE, THYROIDAB in the last 72 hours.  Invalid input(s): FREET3 Anemia work up No results for input(s): VITAMINB12, FOLATE, FERRITIN, TIBC, IRON, RETICCTPCT in the last 72 hours. Urinalysis    Component Value Date/Time   COLORURINE YELLOW (A) 04/28/2020 0938   APPEARANCEUR CLEAR (A) 04/28/2020 0938   APPEARANCEUR Clear 07/14/2013 2143   LABSPEC 1.039 (H) 04/28/2020 0938   LABSPEC 1.025 07/14/2013 2143   PHURINE 5.0 04/28/2020 0938   GLUCOSEU >=500 (A) 04/28/2020 0938   GLUCOSEU >=500 07/14/2013 2143   HGBUR NEGATIVE 04/28/2020 0938   BILIRUBINUR NEGATIVE 04/28/2020 0938   BILIRUBINUR Negative 07/14/2013 2143   KETONESUR 5 (A) 04/28/2020 0938   PROTEINUR NEGATIVE 04/28/2020 0938   NITRITE NEGATIVE 04/28/2020 0938   LEUKOCYTESUR NEGATIVE 04/28/2020 0938   LEUKOCYTESUR 1+ 07/14/2013 2143   Sepsis Labs Invalid input(s): PROCALCITONIN,  WBC,  LACTICIDVEN Microbiology Recent Results (from the past 240 hour(s))  SARS Coronavirus 2 by RT PCR (hospital order, performed in [place] hospital lab) Nasopharyngeal Nasopharyngeal Swab     Status: None   Collection Time: 04/28/20  9:48 AM   Specimen: Nasopharyngeal Swab  Result Value Ref Range Status   SARS Coronavirus 2 NEGATIVE NEGATIVE Final    Comment: (NOTE) SARS-CoV-2 target nucleic acids are NOT DETECTED.  The SARS-CoV-2 RNA is generally detectable in upper and lower respiratory specimens during the acute phase  of infection. The lowest concentration of SARS-CoV-2 viral copies this assay can detect is 250 copies / mL. A negative result does not preclude SARS-CoV-2 infection and should not be used as the sole basis for treatment or other patient management decisions.  A negative result may occur with improper specimen collection / handling, submission of specimen other than nasopharyngeal swab, presence of viral mutation(s) within the areas targeted by this  assay, and inadequate number of viral copies (<250 copies / mL). A negative result must be combined with clinical observations, patient history, and epidemiological information.  Fact Sheet for Patients:   StrictlyIdeas.no  Fact Sheet for Healthcare Providers: BankingDealers.co.za  This test is not yet approved or  cleared by the [place] FDA and has been authorized for detection and/or diagnosis of SARS-CoV-2 by FDA under an Emergency Use Authorization (EUA).  This EUA will remain in effect (meaning this test can be used) for the duration of the COVID-19 declaration under Section 564(b)(1) of the Act, 21 U.S.C. section 360bbb-3(b)(1), unless the authorization is terminated or revoked sooner.  Performed at [facility], [address] [place],  [address]     Procedures/Studies: [place] Chest 2 View  Result Date: 04/28/2020 CLINICAL DATA:  Chest pain EXAM: CHEST - 2 VIEW COMPARISON:  05/24/2019 FINDINGS: Normal heart size and mediastinal contours. No acute infiltrate or edema. Nodular calcification at the left base is in the breast by CT. No effusion or pneumothorax. No acute osseous findings. IMPRESSION: No active cardiopulmonary disease. Electronically Signed   By: Monte Fantasia M.D.   On: 04/28/2020 04:52   CT ABDOMEN PELVIS W CONTRAST  Result Date: 04/28/2020 CLINICAL DATA:  Abdominal pain for 4 days.  Elevated serum lipase EXAM: CT ABDOMEN AND PELVIS WITH CONTRAST  TECHNIQUE: Multidetector CT imaging of the abdomen and pelvis was performed using the standard protocol following bolus administration of intravenous contrast. CONTRAST:  147mL OMNIPAQUE IOHEXOL 300 MG/ML  SOLN COMPARISON:  08/28/2013 FINDINGS: Lower chest: No acute abnormality. Hepatobiliary: No focal liver abnormality is seen. Status post cholecystectomy. No biliary dilatation. Pancreas: Subtle peripancreatic fat stranding anterior to the pancreatic body (series 2, image 25). No pancreatic ductal dilatation. Unremarkable parenchymal enhancement without evidence of necrosis. No peripancreatic fluid collection. Spleen: Normal in size without focal abnormality. Adrenals/Urinary Tract: 1.5 cm indeterminate density left adrenal gland nodule, increased from prior. 1.0 cm indeterminate right adrenal gland nodule, similar in appearance to prior. Focal area of cortical scarring at the superior pole the right kidney. Kidneys have otherwise symmetric enhancement. No renal mass, stone, or hydronephrosis. Urinary bladder is unremarkable for the degree of distension. Stomach/Bowel: Stomach is within normal limits. Appendix appears normal. No evidence of bowel wall thickening, distention, or inflammatory changes. Vascular/Lymphatic: Aortoiliac atherosclerosis without aneurysm. Abdominal aorta is diminutive in caliber, unchanged from prior. No abdominopelvic lymphadenopathy. Reproductive: Unremarkable uterus with bilateral tubal ligation clips. No adnexal masses. Other: No free-air, free-fluid, or abscess. No abdominal wall hernia. Musculoskeletal: Ill-defined area of soft tissue density within the left lower anterior abdominal wall, which was seen on multiple previous studies and may reflect prior injection related changes. No acute osseous abnormality. IMPRESSION: 1. Subtle peripancreatic fat stranding anterior to the pancreatic body suggestive of acute pancreatitis. No peripancreatic fluid collection or evidence of necrosis.  2. Bilateral indeterminate adrenal gland nodules, measuring up to 1.5 cm on the left and 1.0 cm on the right. Further evaluation with a nonemergent adrenal protocol CT is recommended. 3. Aortic atherosclerosis. (ICD10-I70.0). Electronically Signed   By: Davina Poke D.O.   On: 04/28/2020 08:30    Time coordinating discharge: Over 30 minutes  SIGNED:   Guil[place] Shi, MD  [facility] 05/01/2020, 12:28 PM

## 2020-05-01 NOTE — TOC Transition Note (Signed)
Transition of Care [facility]) - CM/SW Discharge Note   Patient Details  Name: Kristie Olson MRN: [id number] Date of Birth: March 13, 1956  [facility]) CM/SW Contact:  Shelbie Hutching, RN Phone Number: 05/01/2020, 3:12 PM   Clinical Narrative:    Patient is medically cleared for discharge home.  Patient has no discharge needs but did request information on Meals on Wheels.  This RNCM gave patient handout about Meals on Wheels and instructed to patient to call and see if she is eligible.     Final next level of care: Home/Self Care Barriers to Discharge: Barriers Resolved   Patient Goals and CMS Choice        Discharge Placement                       Discharge Plan and Services                                     Social Determinants of Health (SDOH) Interventions     Readmission Risk Interventions No flowsheet data found.

## 2020-05-01 NOTE — Progress Notes (Signed)
PHARMACIST - PHYSICIAN COMMUNICATION  DR:   Earnest Conroy  CONCERNING: IV to Oral Route Change Policy  RECOMMENDATION: This patient is receiving pantoprazole by the intravenous route.  Based on criteria approved by the Pharmacy and Therapeutics Committee, the intravenous medication(s) is/are being converted to the equivalent oral dose form(s).   DESCRIPTION: These criteria include:  The patient is eating (either orally or via tube) and/or has been taking other orally administered medications for a least 24 hours  The patient has no evidence of active gastrointestinal bleeding or impaired GI absorption (gastrectomy, short bowel, patient on TNA or NPO).  If you have questions about this conversion, please contact the Pharmacy Department  []   [phone] )  [facility] [x]   [phone] )  [facility] []   [phone] )  [facility] []   [phone] )  [facility] []   [phone] )  [place], PharmD, BCPS Clinical Pharmacist 05/01/2020 11:25 AM

## 2020-05-01 NOTE — Care Management Important Message (Signed)
Important Message  Patient Details  Name: Kristie Olson MRN: [id number] Date of Birth: 03-31-1956   Medicare Important Message Given:  Yes     Juliann Pulse A Jcion Buddenhagen 05/01/2020, 11:03 AM

## 2020-05-07 ENCOUNTER — Other Ambulatory Visit: Payer: Self-pay | Admitting: Family Medicine

## 2020-05-07 DIAGNOSIS — E278 Other specified disorders of adrenal gland: Secondary | ICD-10-CM

## 2020-05-22 ENCOUNTER — Other Ambulatory Visit: Payer: Self-pay

## 2020-05-22 ENCOUNTER — Ambulatory Visit
Admission: RE | Admit: 2020-05-22 | Discharge: 2020-05-22 | Disposition: A | Discharge status: Home / Self Care | Payer: Medicare Other | Source: Ambulatory Visit | Attending: Family Medicine | Admitting: Family Medicine

## 2020-05-22 ENCOUNTER — Other Ambulatory Visit: Payer: Self-pay | Admitting: Family Medicine

## 2020-05-22 DIAGNOSIS — D3501 Benign neoplasm of right adrenal gland: Secondary | ICD-10-CM | POA: Diagnosis not present

## 2020-05-22 DIAGNOSIS — E278 Other specified disorders of adrenal gland: Secondary | ICD-10-CM

## 2020-05-22 DIAGNOSIS — D3502 Benign neoplasm of left adrenal gland: Secondary | ICD-10-CM | POA: Insufficient documentation

## 2020-05-22 DIAGNOSIS — I700 Atherosclerosis of aorta: Secondary | ICD-10-CM | POA: Insufficient documentation

## 2020-07-01 ENCOUNTER — Other Ambulatory Visit: Payer: Self-pay

## 2020-07-01 DIAGNOSIS — R350 Frequency of micturition: Secondary | ICD-10-CM | POA: Diagnosis present

## 2020-07-01 DIAGNOSIS — Z5321 Procedure and treatment not carried out due to patient leaving prior to being seen by health care provider: Secondary | ICD-10-CM | POA: Insufficient documentation

## 2020-07-01 LAB — URINALYSIS, COMPLETE (UACMP) WITH MICROSCOPIC
Bilirubin Urine: NEGATIVE
Glucose, UA: >=500 mg/dL — AB
Ketones, ur: NEGATIVE mg/dL
Leukocytes,Ua: NEGATIVE
Nitrite: NEGATIVE
Protein, ur: NEGATIVE mg/dL
Specific Gravity, Urine: 1.01 (ref 1.005–1.030)
pH: 6 (ref 5.0–8.0)

## 2020-07-01 LAB — CBC
HCT: 41.3 % (ref 36.0–46.0)
Hemoglobin: 14.1 g/dL (ref 12.0–15.0)
MCH: 32.1 pg (ref 26.0–34.0)
MCHC: 34.1 g/dL (ref 30.0–36.0)
MCV: 94.1 fL (ref 80.0–100.0)
Platelets: 414 10*3/uL — ABNORMAL HIGH (ref 150–400)
RBC: 4.39 MIL/uL (ref 3.87–5.11)
RDW: 13.2 % (ref 11.5–15.5)
WBC: 7.8 10*3/uL (ref 4.0–10.5)
nRBC: 0 % (ref 0.0–0.2)

## 2020-07-01 LAB — BASIC METABOLIC PANEL
Anion gap: 10 (ref 5–15)
BUN: 9 mg/dL (ref 8–23)
CO2: 29 mmol/L (ref 22–32)
Calcium: 9.7 mg/dL (ref 8.9–10.3)
Chloride: 101 mmol/L (ref 98–111)
Creatinine, Ser: 0.65 mg/dL (ref 0.44–1.00)
GFR calc Af Amer: >60 mL/min (ref >60)
GFR calc non Af Amer: >60 mL/min (ref >60)
Glucose, Bld: 190 mg/dL — ABNORMAL HIGH (ref 70–99)
Potassium: 3.5 mmol/L (ref 3.5–5.1)
Sodium: 140 mmol/L (ref 135–145)

## 2020-07-01 NOTE — ED Notes (Signed)
Pt taken to restroom and cleaned of urine.  New brief placed.

## 2020-07-01 NOTE — ED Notes (Addendum)
Family called per pt request and asked them to bring her phone back to her.

## 2020-07-01 NOTE — ED Triage Notes (Signed)
Pt states urinary frequency since yesterday morning. States she feels like something is going to come out. States pain from bladder up to head. A&O, in wheelchair. Had urinated self PTA and provided gown and paper scrub pants to wear. Pt shown bathroom and provided wipes to clean self. Clothes placed in pt belongings bags.

## 2020-07-02 ENCOUNTER — Emergency Department
Admission: EM | Admit: 2020-07-02 | Discharge: 2020-07-02 | Disposition: A | Discharge status: Home / Self Care | Payer: Medicare Other | Attending: Emergency Medicine | Admitting: Emergency Medicine

## 2020-07-03 ENCOUNTER — Telehealth: Payer: Self-pay | Admitting: Emergency Medicine

## 2020-07-03 NOTE — Telephone Encounter (Signed)
Called patient due to lwot to inquire about condition and follow up plans. Left message.   

## 2020-07-31 DIAGNOSIS — I10 Essential (primary) hypertension: Secondary | ICD-10-CM | POA: Insufficient documentation

## 2020-07-31 DIAGNOSIS — E1129 Type 2 diabetes mellitus with other diabetic kidney complication: Secondary | ICD-10-CM | POA: Insufficient documentation

## 2020-07-31 DIAGNOSIS — R319 Hematuria, unspecified: Secondary | ICD-10-CM | POA: Insufficient documentation

## 2020-07-31 DIAGNOSIS — R809 Proteinuria, unspecified: Secondary | ICD-10-CM | POA: Insufficient documentation

## 2020-09-16 DIAGNOSIS — K859 Acute pancreatitis without necrosis or infection, unspecified: Secondary | ICD-10-CM | POA: Insufficient documentation

## 2020-09-16 DIAGNOSIS — E119 Type 2 diabetes mellitus without complications: Secondary | ICD-10-CM | POA: Insufficient documentation

## 2020-09-16 DIAGNOSIS — G629 Polyneuropathy, unspecified: Secondary | ICD-10-CM | POA: Insufficient documentation

## 2020-09-16 DIAGNOSIS — J209 Acute bronchitis, unspecified: Secondary | ICD-10-CM | POA: Insufficient documentation

## 2020-09-16 DIAGNOSIS — J45909 Unspecified asthma, uncomplicated: Secondary | ICD-10-CM | POA: Insufficient documentation

## 2020-09-16 DIAGNOSIS — F419 Anxiety disorder, unspecified: Secondary | ICD-10-CM | POA: Insufficient documentation

## 2020-09-16 DIAGNOSIS — F32A Depression, unspecified: Secondary | ICD-10-CM | POA: Insufficient documentation

## 2020-09-18 DIAGNOSIS — I4510 Unspecified right bundle-branch block: Secondary | ICD-10-CM | POA: Insufficient documentation

## 2020-09-18 DIAGNOSIS — L292 Pruritus vulvae: Secondary | ICD-10-CM | POA: Insufficient documentation

## 2020-09-18 DIAGNOSIS — R21 Rash and other nonspecific skin eruption: Secondary | ICD-10-CM | POA: Insufficient documentation

## 2020-10-28 ENCOUNTER — Other Ambulatory Visit: Payer: Self-pay | Admitting: Family Medicine

## 2020-10-28 DIAGNOSIS — Z1231 Encounter for screening mammogram for malignant neoplasm of breast: Secondary | ICD-10-CM

## 2020-11-03 ENCOUNTER — Telehealth: Payer: Self-pay | Admitting: *Deleted

## 2020-11-03 NOTE — Telephone Encounter (Signed)
Attempted to contact and schedule lung screening scan. Message left for patient to call back to schedule. 

## 2020-11-19 ENCOUNTER — Telehealth: Payer: Self-pay | Admitting: *Deleted

## 2020-11-19 NOTE — Telephone Encounter (Signed)
Attempted to contact and schedule lung screening scan. Message left for patient to call back to schedule. 

## 2021-01-07 ENCOUNTER — Telehealth: Payer: Self-pay | Admitting: *Deleted

## 2021-01-07 NOTE — Telephone Encounter (Signed)
Attempted to contact and schedule annual lung screeing. Patient's voice mail was full.

## 2021-01-16 ENCOUNTER — Telehealth: Payer: Self-pay

## 2021-01-16 DIAGNOSIS — M47812 Spondylosis without myelopathy or radiculopathy, cervical region: Secondary | ICD-10-CM | POA: Insufficient documentation

## 2021-01-16 DIAGNOSIS — M25819 Other specified joint disorders, unspecified shoulder: Secondary | ICD-10-CM | POA: Insufficient documentation

## 2021-01-16 DIAGNOSIS — M719 Bursopathy, unspecified: Secondary | ICD-10-CM | POA: Insufficient documentation

## 2021-01-16 NOTE — Telephone Encounter (Signed)
01/16/2021 called  Home and Mobile # listed  Both went straight to voicemail, left a message state I was calling from the [place] screening program, with the the call back number [phone]. SJC

## 2021-03-09 ENCOUNTER — Emergency Department
Admission: EM | Admit: 2021-03-09 | Discharge: 2021-03-10 | Disposition: A | Discharge status: Home / Self Care | Payer: Medicare Other | Attending: Emergency Medicine | Admitting: Emergency Medicine

## 2021-03-09 DIAGNOSIS — I10 Essential (primary) hypertension: Secondary | ICD-10-CM | POA: Insufficient documentation

## 2021-03-09 DIAGNOSIS — R42 Dizziness and giddiness: Secondary | ICD-10-CM | POA: Diagnosis present

## 2021-03-09 DIAGNOSIS — I2510 Atherosclerotic heart disease of native coronary artery without angina pectoris: Secondary | ICD-10-CM | POA: Diagnosis not present

## 2021-03-09 DIAGNOSIS — Z79899 Other long term (current) drug therapy: Secondary | ICD-10-CM | POA: Diagnosis not present

## 2021-03-09 DIAGNOSIS — E1142 Type 2 diabetes mellitus with diabetic polyneuropathy: Secondary | ICD-10-CM | POA: Insufficient documentation

## 2021-03-09 DIAGNOSIS — F17210 Nicotine dependence, cigarettes, uncomplicated: Secondary | ICD-10-CM | POA: Diagnosis not present

## 2021-03-09 DIAGNOSIS — J45909 Unspecified asthma, uncomplicated: Secondary | ICD-10-CM | POA: Insufficient documentation

## 2021-03-09 DIAGNOSIS — Z794 Long term (current) use of insulin: Secondary | ICD-10-CM | POA: Insufficient documentation

## 2021-03-09 LAB — CBC
HCT: 39.2 % (ref 36.0–46.0)
Hemoglobin: 13.4 g/dL (ref 12.0–15.0)
MCH: 31.5 pg (ref 26.0–34.0)
MCHC: 34.2 g/dL (ref 30.0–36.0)
MCV: 92 fL (ref 80.0–100.0)
Platelets: 392 10*3/uL (ref 150–400)
RBC: 4.26 MIL/uL (ref 3.87–5.11)
RDW: 13.3 % (ref 11.5–15.5)
WBC: 8.6 10*3/uL (ref 4.0–10.5)
nRBC: 0 % (ref 0.0–0.2)

## 2021-03-09 LAB — URINALYSIS, COMPLETE (UACMP) WITH MICROSCOPIC
Bacteria, UA: NONE SEEN
Bilirubin Urine: NEGATIVE
Glucose, UA: >=500 mg/dL — AB
Hgb urine dipstick: NEGATIVE
Ketones, ur: NEGATIVE mg/dL
Leukocytes,Ua: NEGATIVE
Nitrite: NEGATIVE
Protein, ur: NEGATIVE mg/dL
Specific Gravity, Urine: 1.022 (ref 1.005–1.030)
Squamous Epithelial / HPF: NONE SEEN (ref 0–5)
pH: 7 (ref 5.0–8.0)

## 2021-03-09 LAB — BASIC METABOLIC PANEL
Anion gap: 10 (ref 5–15)
BUN: 12 mg/dL (ref 8–23)
CO2: 25 mmol/L (ref 22–32)
Calcium: 8.9 mg/dL (ref 8.9–10.3)
Chloride: 105 mmol/L (ref 98–111)
Creatinine, Ser: 0.89 mg/dL (ref 0.44–1.00)
GFR, Estimated: >60 mL/min (ref >60)
Glucose, Bld: 255 mg/dL — ABNORMAL HIGH (ref 70–99)
Potassium: 3.6 mmol/L (ref 3.5–5.1)
Sodium: 140 mmol/L (ref 135–145)

## 2021-03-09 MED ORDER — MECLIZINE HCL 25 MG PO TABS
50.0000 mg | ORAL_TABLET | Freq: Once | ORAL | Status: AC
  Administered 2021-03-09: 50 mg via ORAL
  Filled 2021-03-09: qty 2

## 2021-03-09 MED ORDER — SODIUM CHLORIDE 0.9 % IV BOLUS
1000.0000 mL | Freq: Once | INTRAVENOUS | Status: AC
  Administered 2021-03-09: 1000 mL via INTRAVENOUS

## 2021-03-09 NOTE — ED Provider Notes (Signed)
[facility] Emergency Department Provider Note  ____________________________________________  Time seen: Approximately 11:54 PM  I have reviewed the triage vital signs and the nursing notes.   HISTORY  Chief Complaint Dizziness (Reports dizziness x 2-3 hours PTA.  Unable to ambulate.  Denies pain/SOB.  //Denies ETOH.  VSS.  BGL 290 ems.  )    HPI Kristie Olson is a 65 y.o. female with a history of CAD hypertension diabetes who comes to ED complaining of dizziness.  She was in her usual state of health, folding laundry, and reports a sudden onset of room spinning motion sensation that made her feel "drunk."  She laid down, and being still seem to help but whenever she turned her head it got worse.  It is now improved but still persistent.  It has been constant during this time.  Denies any pain, no headache or vision changes.  No focal paresthesias or weakness.  No recent fall or head trauma.   EMR reviewed, last neuroimaging was 2008 CT head at outside hospital which was unremarkable   Past Medical History:  Diagnosis Date  . Asthma   . Coronary artery disease   . Diabetes mellitus without complication ([place]) 0086  . Hypertension      Patient Active Problem List   Diagnosis Date Noted  . Cervical spondylosis without myelopathy 01/16/2021  . Disorder of bursae of shoulder region 01/16/2021  . Right bundle branch block 09/18/2020  . Skin macule or macular rash 09/18/2020  . Vulvar itching 09/18/2020  . Asthma 09/16/2020  . Peripheral neuropathy 09/16/2020  . Pancreatitis 09/16/2020  . Anxiety and depression 09/16/2020  . Type 2 diabetes mellitus ([place]) 09/16/2020  . Benign essential hypertension 07/31/2020  . Hematuria 07/31/2020  . Proteinuria 07/31/2020  . Type II or unspecified type diabetes mellitus with renal manifestations, not stated as uncontrolled 07/31/2020  . Acute pancreatitis 04/28/2020  . Diabetes mellitus with hyperglycemia ([place])   .  Dehydration   . Depression   . Bronchitis 11/26/2018  . Cervical radiculopathy 08/14/2018  . Type 2 diabetes mellitus with hyperglycemia ([place]) 06/20/2018  . Breast abscess of female 03/29/2018  . Transient ischemic attack 08/05/2017  . Slurred speech 07/27/2017  . Dizziness 02/22/2017  . Long term current use of insulin ([place]) 11/01/2013  . Type 2 diabetes mellitus with diabetic neuropathy ([place]) 04/18/2013  . Hyperlipidemia 07/19/2008  . Hypertension 10/25/2007     Past Surgical History:  Procedure Laterality Date  . BREAST EXCISIONAL BIOPSY Left 1980's   neg  . BREAST MASS EXCISION Left    age 20"s, benign  . CESAREAN SECTION    . CHOLECYSTECTOMY  2009     Prior to Admission medications   Medication Sig Start Date End Date Taking? Authorizing Provider  meclizine (ANTIVERT) 25 MG tablet Take 1 tablet (25 mg total) by mouth 3 (three) times daily as needed for dizziness or nausea. 03/10/21  Yes Carrie Mew, MD  acetaminophen (TYLENOL) 500 MG tablet Take 1,000 mg by mouth every 8 (eight) hours as needed for moderate pain.    [provider]  albuterol (PROVENTIL HFA;VENTOLIN HFA) 108 (90 Base) MCG/ACT inhaler Inhale 2 puffs into the lungs every 4 (four) hours as needed for wheezing or shortness of breath.    [provider]  amitriptyline (ELAVIL) 100 MG tablet Take 50 mg by mouth at bedtime as needed for sleep.    [provider]  amLODipine (NORVASC) 2.5 MG tablet Take 2.5 mg by mouth  daily. 03/14/20   [provider]  aspirin 325 MG tablet Take 325 mg by mouth daily.    [provider]  azelastine (OPTIVAR) 0.05 % ophthalmic solution Place 1 drop into both eyes daily. Patient not taking: Reported on 04/28/2020 04/12/17   [provider]  diphenhydrAMINE (BENADRYL) 25 MG tablet Take 25 mg by mouth 2 (two) times daily as needed for itching or allergies.    [provider]  FLUoxetine (PROZAC) 20 MG capsule Take 20 mg  by mouth Nightly. Take along with 40 mg capsule at bedtime    [provider]  FLUoxetine (PROZAC) 40 MG capsule Take 40 mg by mouth daily. Take with 20 mg capsule at bedtime    [provider]  fluticasone (FLOVENT HFA) 44 MCG/ACT inhaler Inhale 1 puff into the lungs 2 (two) times daily.    [provider]  gabapentin (NEURONTIN) 100 MG capsule Take 300 mg by mouth every evening.     [provider]  glipiZIDE (GLUCOTROL) 10 MG tablet Take 10 mg by mouth daily with lunch.     [provider]  insulin aspart protamine- aspart (NOVOLOG MIX 70/30) (70-30) 100 UNIT/ML injection Inject 25-75 Units into the skin 2 (two) times daily with a meal. 75 units in the morning and 25 units at night    [provider]  ipratropium-albuterol (DUONEB) 0.5-2.5 (3) MG/3ML SOLN Take 3 mLs by nebulization 3 (three) times daily. 11/30/18   Dustin Flock, MD  lisinopril (ZESTRIL) 20 MG tablet Take 0.5 tablets (10 mg total) by mouth daily. 05/01/20 05/01/21  Guilford Shi, MD  metoprolol tartrate (LOPRESSOR) 25 MG tablet Take 1 tablet (25 mg total) by mouth 2 (two) times daily. 11/30/18   Dustin Flock, MD  mupirocin ointment (BACTROBAN) 2 % Apply 1 application topically daily.    [provider]  omeprazole (PRILOSEC) 40 MG capsule Take 40 mg by mouth daily.    [provider]  ondansetron (ZOFRAN) 4 MG tablet Take 1 tablet (4 mg total) by mouth every 6 (six) hours as needed for nausea. 05/01/20   Guilford Shi, MD  pantoprazole (PROTONIX) 40 MG tablet Take 1 tablet (40 mg total) by mouth daily. 05/01/20 05/01/21  Guilford Shi, MD  pravastatin (PRAVACHOL) 20 MG tablet Take 1 tablet (20 mg total) by mouth daily. 05/01/20   Guilford Shi, MD     Allergies Ibuprofen   Family History  Problem Relation Age of Onset  . Breast cancer Sister 52  . Breast cancer Paternal Aunt     Social History Social History   Tobacco Use  . Smoking  status: Current Every Day Smoker    Packs/day: 0.75    Years: 46.00    Pack years: 34.50    Types: Cigarettes  . Smokeless tobacco: Never Used  Substance Use Topics  . Alcohol use: No    Review of Systems  Constitutional:   No fever or chills.  ENT:   No sore throat. No rhinorrhea. Cardiovascular:   No chest pain or syncope. Respiratory:   No dyspnea or cough. Gastrointestinal:   Negative for abdominal pain, vomiting and diarrhea.  Musculoskeletal:   Negative for focal pain or swelling All other systems reviewed and are negative except as documented above in ROS and HPI.  ____________________________________________   PHYSICAL EXAM:  VITAL SIGNS: ED Triage Vitals  Enc Vitals Group     BP 03/09/21 2238 135/65     Pulse Rate 03/09/21 2238 82  Resp 03/09/21 2238 18     Temp 03/09/21 2238 98.7 F (37.1 C)     Temp Source 03/09/21 2238 Oral     SpO2 03/09/21 2238 98 %     Weight 03/09/21 2235 209 lb (94.8 kg)     Height 03/09/21 2235 5' (1.524 m)     Head Circumference --      Peak Flow --      Pain Score 03/09/21 2235 6     Pain Loc --      Pain Edu? --      Excl. in [place]? --     Vital signs reviewed, nursing assessments reviewed.   Constitutional:   Alert and oriented. Non-toxic appearance. Eyes:   Conjunctivae are normal. EOMI. PERRL.  ENT      Head:   Normocephalic and atraumatic.      Nose:   Wearing a mask.      Mouth/Throat:   Wearing a mask.      Neck:   No meningismus. Full ROM. Hematological/Lymphatic/Immunilogical:   No cervical lymphadenopathy. Cardiovascular:   RRR. Symmetric bilateral radial and DP pulses.  No murmurs. Cap refill less than 2 seconds. Respiratory:   Normal respiratory effort without tachypnea/retractions. Breath sounds are clear and equal bilaterally. No wheezes/rales/rhonchi. Gastrointestinal:   Soft and nontender. Non distended. There is no CVA tenderness.  No rebound, rigidity, or guarding. Genitourinary:    deferred Musculoskeletal:   Normal range of motion in all extremities. No joint effusions.  No lower extremity tenderness.  No edema. Neurologic:   Normal speech and language.  Motor grossly intact. Finger-nose-finger normal.  Cerebellar function intact. No acute focal neurologic deficits are appreciated.  Skin:    Skin is warm, dry and intact. No rash noted.  No petechiae, purpura, or bullae.  ____________________________________________    LABS (pertinent positives/negatives) (all labs ordered are listed, but only abnormal results are displayed) Labs Reviewed  BASIC METABOLIC PANEL - Abnormal; Notable for the following components:      Result Value   Glucose, Bld 255 (*)    All other components within normal limits  URINALYSIS, COMPLETE (UACMP) WITH MICROSCOPIC - Abnormal; Notable for the following components:   Color, Urine STRAW (*)    APPearance CLEAR (*)    Glucose, UA >=500 (*)    All other components within normal limits  CBC  CBG MONITORING, ED   ____________________________________________   EKG  Interpreted by me Sinus rhythm rate of 82, normal axis.  Right bundle branch block.  Normal ST segments and T waves.  ____________________________________________    RADIOLOGY  CT Head Wo Contrast  Result Date: 03/10/2021 CLINICAL DATA:  Dizziness EXAM: CT HEAD WITHOUT CONTRAST TECHNIQUE: Contiguous axial images were obtained from the base of the skull through the vertex without intravenous contrast. COMPARISON:  MRI 07/28/2017 FINDINGS: Brain: Normal anatomic configuration. No abnormal intra or extra-axial mass lesion or fluid collection. No abnormal mass effect or midline shift. No evidence of acute intracranial hemorrhage or infarct. Ventricular size is normal. Cerebellum unremarkable. Vascular: Unremarkable Skull: Intact Sinuses/Orbits: Paranasal sinuses are clear. Orbits are unremarkable. Other: Mastoid air cells and middle ear cavities are clear. IMPRESSION: No acute  intracranial abnormality. Electronically Signed   By: Fidela Salisbury MD   On: 03/10/2021 01:24    ____________________________________________   PROCEDURES Procedures  ____________________________________________  DIFFERENTIAL DIAGNOSIS   Intracranial hemorrhage, intracranial mass, dehydration, peripheral vertigo, electrolyte abnormality  CLINICAL IMPRESSION / ASSESSMENT AND PLAN / ED COURSE  Medications ordered  in the ED: Medications  acetaminophen (TYLENOL) tablet 1,000 mg (has no administration in time range)  sodium chloride 0.9 % bolus 1,000 mL (0 mLs Intravenous Stopped 03/10/21 0041)  meclizine (ANTIVERT) tablet 50 mg (50 mg Oral Given 03/09/21 2338)    Pertinent labs & imaging results that were available during my care of the patient were reviewed by me and considered in my medical decision making (see chart for details).  Kristie Olson was evaluated in Emergency Department on 03/10/2021 for the symptoms described in the history of present illness. She was evaluated in the context of the global COVID-19 pandemic, which necessitated consideration that the patient might be at risk for infection with the SARS-CoV-2 virus that causes COVID-19. Institutional protocols and algorithms that pertain to the evaluation of patients at risk for COVID-19 are in a state of rapid change based on information released by regulatory bodies including the CDC and federal and state organizations. These policies and algorithms were followed during the patient's care in the ED.   Patient presents with sudden onset of vertiginous symptoms.  Otherwise neurologically intact.  By history, is consistent with peripheral vertigo.  Vital signs are normal, EKG unremarkable.  Labs unremarkable.  Will give IV fluids for hydration, oral meclizine, obtain CT head.   ----------------------------------------- 2:41 AM on 03/10/2021 -----------------------------------------  CT head unremarkable.  Vital signs  remained stable, patient feeling better.  Stable for discharge home to follow-up with PCP.     ____________________________________________   FINAL CLINICAL IMPRESSION(S) / ED DIAGNOSES    Final diagnoses:  Vertigo     ED Discharge Orders         Ordered    meclizine (ANTIVERT) 25 MG tablet  3 times daily PRN        03/10/21 0240          Portions of this note were generated with dragon dictation software. Dictation errors may occur despite best attempts at proofreading.   Carrie Mew, MD 03/10/21 [phone]

## 2021-03-10 ENCOUNTER — Emergency Department: Payer: Medicare Other

## 2021-03-10 MED ORDER — MECLIZINE HCL 25 MG PO TABS: 25.0000 mg | ORAL_TABLET | Freq: Three times a day (TID) | ORAL | 1 refills | Status: DC | PRN

## 2021-03-10 MED ORDER — ACETAMINOPHEN 500 MG PO TABS
1000.0000 mg | ORAL_TABLET | Freq: Once | ORAL | Status: AC
  Administered 2021-03-10: 1000 mg via ORAL
  Filled 2021-03-10: qty 2

## 2021-03-10 NOTE — Discharge Instructions (Signed)
Your lab tests and CT scan of the head were all okay today.  Please follow-up with your doctor for continued monitoring of your symptoms.

## 2021-08-20 ENCOUNTER — Other Ambulatory Visit: Payer: Self-pay | Admitting: Family Medicine

## 2021-08-20 DIAGNOSIS — Z1231 Encounter for screening mammogram for malignant neoplasm of breast: Secondary | ICD-10-CM

## 2021-08-20 DIAGNOSIS — Z13820 Encounter for screening for osteoporosis: Secondary | ICD-10-CM

## 2021-10-16 ENCOUNTER — Other Ambulatory Visit (HOSPITAL_COMMUNITY): Payer: Self-pay | Admitting: Physician Assistant

## 2021-10-16 ENCOUNTER — Other Ambulatory Visit: Payer: Self-pay | Admitting: Physician Assistant

## 2021-10-16 DIAGNOSIS — R42 Dizziness and giddiness: Secondary | ICD-10-CM

## 2021-10-16 DIAGNOSIS — H539 Unspecified visual disturbance: Secondary | ICD-10-CM

## 2021-10-16 DIAGNOSIS — R519 Headache, unspecified: Secondary | ICD-10-CM

## 2021-12-01 ENCOUNTER — Ambulatory Visit
Admission: RE | Admit: 2021-12-01 | Discharge: 2021-12-01 | Disposition: A | Discharge status: Home / Self Care | Payer: Medicare Other | Source: Ambulatory Visit | Attending: Physician Assistant | Admitting: Physician Assistant

## 2021-12-01 ENCOUNTER — Other Ambulatory Visit: Payer: Self-pay

## 2021-12-01 DIAGNOSIS — R42 Dizziness and giddiness: Secondary | ICD-10-CM | POA: Insufficient documentation

## 2021-12-01 DIAGNOSIS — H539 Unspecified visual disturbance: Secondary | ICD-10-CM

## 2021-12-01 DIAGNOSIS — R519 Headache, unspecified: Secondary | ICD-10-CM

## 2022-02-01 ENCOUNTER — Telehealth: Payer: Self-pay

## 2022-02-01 ENCOUNTER — Other Ambulatory Visit: Payer: Self-pay

## 2022-02-01 NOTE — Telephone Encounter (Signed)
CALLED PATIENT NO ANSWER LEFT VOICEMAIL FOR A CALL BACK ?NEW PATIENT ANY ?

## 2022-02-02 ENCOUNTER — Telehealth: Payer: Self-pay

## 2022-02-02 NOTE — Telephone Encounter (Signed)
Scheduled for 05/24/2022 ?

## 2022-05-24 ENCOUNTER — Ambulatory Visit: Payer: Medicare Other | Admitting: Gastroenterology

## 2022-05-24 ENCOUNTER — Other Ambulatory Visit: Payer: Self-pay

## 2022-09-02 ENCOUNTER — Other Ambulatory Visit: Payer: Self-pay

## 2022-09-02 ENCOUNTER — Emergency Department: Payer: Medicare Other

## 2022-09-02 ENCOUNTER — Inpatient Hospital Stay
Admission: EM | Admit: 2022-09-02 | Discharge: 2022-09-10 | DRG: 254 | Disposition: A | Discharge status: Home Health | Payer: Medicare Other | Attending: Student in an Organized Health Care Education/Training Program | Admitting: Student in an Organized Health Care Education/Training Program

## 2022-09-02 ENCOUNTER — Encounter: Payer: Self-pay | Admitting: Emergency Medicine

## 2022-09-02 DIAGNOSIS — K5900 Constipation, unspecified: Secondary | ICD-10-CM | POA: Diagnosis not present

## 2022-09-02 DIAGNOSIS — D75839 Thrombocytosis, unspecified: Secondary | ICD-10-CM | POA: Diagnosis present

## 2022-09-02 DIAGNOSIS — J45909 Unspecified asthma, uncomplicated: Secondary | ICD-10-CM | POA: Diagnosis present

## 2022-09-02 DIAGNOSIS — Z886 Allergy status to analgesic agent status: Secondary | ICD-10-CM

## 2022-09-02 DIAGNOSIS — Z79899 Other long term (current) drug therapy: Secondary | ICD-10-CM

## 2022-09-02 DIAGNOSIS — E669 Obesity, unspecified: Secondary | ICD-10-CM | POA: Diagnosis present

## 2022-09-02 DIAGNOSIS — I2510 Atherosclerotic heart disease of native coronary artery without angina pectoris: Secondary | ICD-10-CM | POA: Diagnosis present

## 2022-09-02 DIAGNOSIS — E1151 Type 2 diabetes mellitus with diabetic peripheral angiopathy without gangrene: Secondary | ICD-10-CM | POA: Diagnosis not present

## 2022-09-02 DIAGNOSIS — Z803 Family history of malignant neoplasm of breast: Secondary | ICD-10-CM

## 2022-09-02 DIAGNOSIS — L97529 Non-pressure chronic ulcer of other part of left foot with unspecified severity: Secondary | ICD-10-CM | POA: Diagnosis present

## 2022-09-02 DIAGNOSIS — Z9049 Acquired absence of other specified parts of digestive tract: Secondary | ICD-10-CM

## 2022-09-02 DIAGNOSIS — I70245 Atherosclerosis of native arteries of left leg with ulceration of other part of foot: Secondary | ICD-10-CM | POA: Diagnosis present

## 2022-09-02 DIAGNOSIS — I10 Essential (primary) hypertension: Secondary | ICD-10-CM | POA: Diagnosis present

## 2022-09-02 DIAGNOSIS — F17210 Nicotine dependence, cigarettes, uncomplicated: Secondary | ICD-10-CM | POA: Diagnosis present

## 2022-09-02 DIAGNOSIS — I70223 Atherosclerosis of native arteries of extremities with rest pain, bilateral legs: Principal | ICD-10-CM

## 2022-09-02 DIAGNOSIS — F32A Depression, unspecified: Secondary | ICD-10-CM | POA: Diagnosis present

## 2022-09-02 DIAGNOSIS — E1165 Type 2 diabetes mellitus with hyperglycemia: Secondary | ICD-10-CM | POA: Diagnosis present

## 2022-09-02 DIAGNOSIS — I739 Peripheral vascular disease, unspecified: Secondary | ICD-10-CM | POA: Diagnosis not present

## 2022-09-02 DIAGNOSIS — Z6839 Body mass index (BMI) 39.0-39.9, adult: Secondary | ICD-10-CM

## 2022-09-02 DIAGNOSIS — I70221 Atherosclerosis of native arteries of extremities with rest pain, right leg: Secondary | ICD-10-CM | POA: Diagnosis present

## 2022-09-02 DIAGNOSIS — E11621 Type 2 diabetes mellitus with foot ulcer: Secondary | ICD-10-CM | POA: Diagnosis present

## 2022-09-02 DIAGNOSIS — Z7984 Long term (current) use of oral hypoglycemic drugs: Secondary | ICD-10-CM

## 2022-09-02 DIAGNOSIS — Z794 Long term (current) use of insulin: Secondary | ICD-10-CM

## 2022-09-02 DIAGNOSIS — E785 Hyperlipidemia, unspecified: Secondary | ICD-10-CM | POA: Diagnosis present

## 2022-09-02 LAB — APTT: aPTT: 36 seconds (ref 24–36)

## 2022-09-02 LAB — BASIC METABOLIC PANEL
Anion gap: 9 (ref 5–15)
BUN: 12 mg/dL (ref 8–23)
CO2: 29 mmol/L (ref 22–32)
Calcium: 9.6 mg/dL (ref 8.9–10.3)
Chloride: 100 mmol/L (ref 98–111)
Creatinine, Ser: 0.75 mg/dL (ref 0.44–1.00)
GFR, Estimated: >60 mL/min (ref >60)
Glucose, Bld: 80 mg/dL (ref 70–99)
Potassium: 3.9 mmol/L (ref 3.5–5.1)
Sodium: 138 mmol/L (ref 135–145)

## 2022-09-02 LAB — CBC
HCT: 41.7 % (ref 36.0–46.0)
Hemoglobin: 13.7 g/dL (ref 12.0–15.0)
MCH: 31.4 pg (ref 26.0–34.0)
MCHC: 32.9 g/dL (ref 30.0–36.0)
MCV: 95.4 fL (ref 80.0–100.0)
Platelets: 493 10*3/uL — ABNORMAL HIGH (ref 150–400)
RBC: 4.37 MIL/uL (ref 3.87–5.11)
RDW: 14 % (ref 11.5–15.5)
WBC: 9 10*3/uL (ref 4.0–10.5)
nRBC: 0 % (ref 0.0–0.2)

## 2022-09-02 LAB — PROTIME-INR
INR: 1 (ref 0.8–1.2)
Prothrombin Time: 13 seconds (ref 11.4–15.2)

## 2022-09-02 MED ORDER — OXYCODONE HCL 5 MG PO TABS
5.0000 mg | ORAL_TABLET | Freq: Once | ORAL | Status: AC
  Administered 2022-09-03: 5 mg via ORAL
  Filled 2022-09-02: qty 1

## 2022-09-02 MED ORDER — IOHEXOL 350 MG/ML SOLN
125.0000 mL | Freq: Once | INTRAVENOUS | Status: AC | PRN
  Administered 2022-09-02: 125 mL via INTRAVENOUS

## 2022-09-02 MED ORDER — ACETAMINOPHEN 500 MG PO TABS
1000.0000 mg | ORAL_TABLET | Freq: Once | ORAL | Status: AC
  Administered 2022-09-03: 1000 mg via ORAL
  Filled 2022-09-02: qty 2

## 2022-09-02 MED ORDER — HEPARIN BOLUS VIA INFUSION
4700.0000 [IU] | Freq: Once | INTRAVENOUS | Status: AC
  Administered 2022-09-02: 4700 [IU] via INTRAVENOUS
  Filled 2022-09-02: qty 4700

## 2022-09-02 MED ORDER — HEPARIN (PORCINE) 25000 UT/250ML-% IV SOLN
1100.0000 [IU]/h | INTRAVENOUS | Status: DC
  Administered 2022-09-02: 1100 [IU]/h via INTRAVENOUS
  Filled 2022-09-02: qty 250

## 2022-09-02 NOTE — Progress Notes (Signed)
[facility] for heparin infusion Indication: Arterial Occlusion  Allergies  Allergen Reactions   Ibuprofen Other (See Comments)    STOMACHACHE   Nsaids     Other reaction(s): Other (See Comments) PUD    Patient Measurements: Height: 5' (152.4 cm) Weight: 90.7 kg (200 lb) IBW/kg (Calculated) : 45.5 Heparin Dosing Weight: 67 kg  Vital Signs: Temp: 98.2 F (36.8 C) (10/19 2026) Temp Source: Oral (10/19 2026) BP: 144/70 (10/19 2026) Pulse Rate: 88 (10/19 2026)  Labs: Recent Labs    09/02/22 1738  HGB 13.7  HCT 41.7  PLT 493*  CREATININE 0.75    Estimated Creatinine Clearance: 67.6 mL/min (by C-G formula based on SCr of 0.75 mg/dL).   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

## 2022-09-02 NOTE — ED Provider Triage Note (Addendum)
Emergency Medicine Provider Triage Evaluation Note  Kristie Olson , a 66 y.o. female  was evaluated in triage.  Pt complains of wounds to the left and right leg.  She said pain, skin discoloration, wounds in both lower legs for couple of months.  No fevers, chills.  She had some numbness in both lower legs.  Pain is constant..  Review of Systems  Positive: Pain numbness skin rash and wounds lower extremities Negative: Fevers  Physical Exam  BP (!) 131/55   Pulse 91   Temp 98.2 F (36.8 C)   Resp 16   Ht 5' (1.524 m)   Wt 90.7 kg   SpO2 99%   BMI 39.06 kg/m  Gen:   Awake, no distress presents in a wheelchair Resp:  Normal effort  MSK:   Moves extremities without difficulty  Other:  Bilateral lower extremities unable to palpate pulses along the DPs bilaterally.  Was able to find a left DP pulse with Doppler, unable to find a right DP pulse with Doppler.  Unable to find bilateral posterior tibialis pulses with Doppler.  Chronic ulcerations left leg, no signs of any infectious process.  Chronic skin discoloration, scaling right lower leg with thickening of the nails.  No open wounds right lower leg  Medical Decision Making  Medically screening exam initiated at 5:33 PM.  Appropriate orders placed.  Kristie Olson was informed that the remainder of the evaluation will be completed by another provider, this initial triage assessment does not replace that evaluation, and the importance of remaining in the ED until their evaluation is complete.  I consulted with vein and vascular, due to chronic nature of patient's symptoms and wounds we will order CT angiogram lower extremities   Duanne Guess, PA-C 09/02/22 1848    Duanne Guess, PA-C 09/02/22 1909    Duanne Guess, PA-C 09/02/22 1946

## 2022-09-02 NOTE — ED Triage Notes (Signed)
Pt states she has had "sores" on her legs in the past and a "wound doctor came out"  Pt has multiple lesions to bilateral legs and states they are on the back of her neck as well.  Pt has some days she can't walk at all d/t pain.  Some lesions are open and appear to be oozing.

## 2022-09-02 NOTE — ED Provider Notes (Signed)
[facility] Provider Note    Event Date/Time   First MD Initiated Contact with Patient 09/02/22 2300     (approximate)   History   Leg Pain   HPI  Kristie HEYDEN is a 66 y.o. female who presents to the ED for evaluation of Leg Pain   I reviewed medical DC summary from 2021.  Obese patient with history of DM, CAD, HTN and HLD.  Recurrent pancreatitis. Patient self-reports a lifelong smoking history with continued 1 PPD smoking.  Estimate 50+ pack year smoker history.  She presents to the ED due to acute on chronic lower extremity pain.  She reports symptoms of claudication chronically.  Her left leg is worse than her right.  She reports increasing pain over the past 1 week and rest pain over the past few days.  No falls or injuries, fevers.  Reports chronic ulcerations to her lower extremities.   Physical Exam   Triage Vital Signs: ED Triage Vitals  Enc Vitals Group     BP 09/02/22 1723 (!) 131/55     Pulse Rate 09/02/22 1723 91     Resp 09/02/22 1723 16     Temp 09/02/22 1723 98.2 F (36.8 C)     Temp Source 09/02/22 2026 Oral     SpO2 09/02/22 1723 99 %     Weight 09/02/22 1724 200 lb (90.7 kg)     Height 09/02/22 1724 5' (1.524 m)     Head Circumference --      Peak Flow --      Pain Score 09/02/22 1728 8     Pain Loc --      Pain Edu? --      Excl. in [place]? --     Most recent vital signs: Vitals:   09/02/22 1723 09/02/22 2026  BP: (!) 131/55 (!) 144/70  Pulse: 91 88  Resp: 16 17  Temp: 98.2 F (36.8 C) 98.2 F (36.8 C)  SpO2: 99% 95%    General: Awake, no distress.  Pleasant and conversational. CV:  Poor capillary refill and very faint DP pulse noted on the right.  Unable to appreciate a DP pulse on the left. Resp:  Normal effort.  Abd:  No distention.  MSK:  Chronic ulcerative lesions noted to her bilateral legs, as pictured below.  No associated erythema, induration fluctuance or superimposed infectious features. Neuro:  No  focal deficits appreciated. Other:        ED Results / Procedures / Treatments   Labs (all labs ordered are listed, but only abnormal results are displayed) Labs Reviewed  CBC - Abnormal; Notable for the following components:      Result Value   Platelets 493 (*)    All other components within normal limits  BASIC METABOLIC PANEL  APTT  PROTIME-INR    EKG   RADIOLOGY CTA bifemoral runoff with what appears to be occlusions of bilateral superficial femoral arteries  Official radiology report(s): CT Angio Aortobifemoral W and/or Wo Contrast  Result Date: 09/02/2022 CLINICAL DATA:  Bilateral lower extremity wounds, discoloration, pain EXAM: CT ANGIOGRAPHY OF ABDOMINAL AORTA WITH ILIOFEMORAL RUNOFF TECHNIQUE: Multidetector CT imaging of the abdomen, pelvis and lower extremities was performed using the standard protocol during bolus administration of intravenous contrast. Multiplanar CT image reconstructions and MIPs were obtained to evaluate the vascular anatomy. RADIATION DOSE REDUCTION: This exam was performed according to the departmental dose-optimization program which includes automated exposure control, adjustment of the mA and/or kV  according to patient size and/or use of iterative reconstruction technique. CONTRAST:  156m OMNIPAQUE IOHEXOL 350 MG/ML SOLN COMPARISON:  None Available. FINDINGS: VASCULAR Aorta: Normal caliber aorta without aneurysm, dissection, vasculitis or significant stenosis. Calcified athero sclerotic plaque. Celiac: Patent without evidence of aneurysm, dissection, vasculitis or significant stenosis. SMA: Patent without evidence of aneurysm, dissection, vasculitis or significant stenosis. Renals: Both renal arteries are patent without evidence of aneurysm, dissection, vasculitis, fibromuscular dysplasia or significant stenosis. IMA: Patent without evidence of aneurysm, dissection, vasculitis or significant stenosis. RIGHT Lower Extremity Inflow: Calcified plaque  causes mild-moderate stenosis of the proximal common iliac artery. Moderate-severe narrowing of the right internal iliac artery. No aneurysm or dissection. Outflow: Calcified plaque causes up to moderate narrowing of the common femoral artery and advanced narrowing of the superficial femoral artery which appears occluded distally. Reconstitution of the distal superficial femoral artery and popliteal artery. Runoff: Patent three vessel runoff to the ankle. LEFT Lower Extremity Inflow: Calcified plaque causes mild-moderate stenosis of the proximal common iliac artery. Moderate-severe narrowing of the right internal iliac artery. No aneurysm or dissection. Outflow: Calcified plaque causes up to moderate narrowing of the common femoral artery and advanced narrowing of the superficial femoral artery which appears occluded distally. Reconstitution of the distal superficial femoral artery and popliteal artery. Runoff: Patent three vessel runoff to the ankle. Veins: No obvious venous abnormality within the limitations of this arterial phase study. Review of the MIP images confirms the above findings. NON-VASCULAR Lower chest: Coronary artery calcification. Normal heart size. No pericardial effusion. Hepatobiliary: No focal liver abnormality is seen. Status post cholecystectomy. No biliary dilatation. Pancreas: Unremarkable. No pancreatic ductal dilatation or surrounding inflammatory changes. Spleen: Normal in size without focal abnormality. Adrenals/Urinary Tract: Unchanged nodules in both adrenal glands likely representing adenomas. Kidneys are normal, without renal calculi, focal lesion, or hydronephrosis. Low-attenuation lesions in the kidneys are statistically likely to represent cysts. No follow-up is required. Bladder is unremarkable. Stomach/Bowel: Stomach is within normal limits. No evidence of bowel wall thickening, distention, or inflammatory changes. Lymphatic: No significant adenopathy. Reproductive:  Unremarkable. Other: No free intraperitoneal fluid or air. Musculoskeletal: No acute osseous abnormality. Foci of soft tissue thickening and adjacent stranding in the anterior abdominal fat about the lower abdomen bilaterally (series 4/image 116). IMPRESSION: VASCULAR Advanced narrowing and likely occlusion of the bilateral superficial femoral arteries with distal reconstitution of the superior femoral/popliteal arteries. Patent three-vessel runoff of both lower extremities. Coronary artery and Aortic Atherosclerosis (ICD10-I70.0). NON-VASCULAR No acute abnormality in the abdomen or pelvis. Foci of soft tissue thickening and adjacent stranding in the lower anterior abdominal wall fat bilaterally. Correlate for panniculitis. Electronically Signed   By: TPlacido SouM.D.   On: 09/02/2022 22:09   DG Tibia/Fibula Left  Result Date: 09/02/2022 CLINICAL DATA:  Wound in the distal left lower extremity. EXAM: LEFT TIBIA AND FIBULA - 2 VIEW COMPARISON:  None Available. FINDINGS: No acute fracture or dislocation. Old appearing fracture of the proximal tibia extending from the tibial spine into the proximal tibial diaphysis. The bones are mildly osteopenic. No significant joint effusion. No periosteal elevation or bone erosion to suggest acute osteomyelitis. Mild soft tissue thickening over the anterior ankle. Faint focus of low attenuation in the skin anterior to the distal tibia likely corresponds to the wound. No radiopaque foreign object. IMPRESSION: 1. No acute fracture or dislocation. 2. No radiographic evidence of acute osteomyelitis. 3. Old appearing fracture of the proximal tibia. Electronically Signed   By: ALaren EvertsD.  On: 09/02/2022 18:06    PROCEDURES and INTERVENTIONS:  .1-3 Lead EKG Interpretation  Performed by: Vladimir Crofts, MD Authorized by: Vladimir Crofts, MD     Interpretation: normal     ECG rate:  80   ECG rate assessment: normal     Rhythm: sinus rhythm     Ectopy: none      Conduction: normal   .Critical Care  Performed by: Vladimir Crofts, MD Authorized by: Vladimir Crofts, MD   Critical care provider statement:    Critical care time (minutes):  30   Critical care time was exclusive of:  Separately billable procedures and treating other patients   Critical care was necessary to treat or prevent imminent or life-threatening deterioration of the following conditions:  Circulatory failure   Critical care was time spent personally by me on the following activities:  Development of treatment plan with patient or surrogate, discussions with consultants, evaluation of patient's response to treatment, examination of patient, ordering and review of laboratory studies, ordering and review of radiographic studies, ordering and performing treatments and interventions, pulse oximetry, re-evaluation of patient's condition and review of old charts   Medications  heparin bolus via infusion 4,700 Units (has no administration in time range)  heparin ADULT infusion 100 units/mL (25000 units/290m) (has no administration in time range)  iohexol (OMNIPAQUE) 350 MG/ML injection 125 mL (125 mLs Intravenous Contrast Given 09/02/22 2131)     IMPRESSION / MDM / ASSESSMENT AND PLAN / ED COURSE  I reviewed the triage vital signs and the nursing notes.  Differential diagnosis includes, but is not limited to, leg ischemia, cellulitis, fracture  {Patient presents with symptoms of an acute illness or injury that is potentially life-threatening.  66year old lifelong smoker presents with acute on chronic lower extremity pain, likely leg ischemia and worsening claudication requiring heparinization and medical admission for vascular angiography.  Reassuring vital signs.  She looks systemically well, but does report chronic and persistent wrist pains.  Poor perfusion on exam.  No neurologic deficits.  Screening blood work is reassuring with a normal CBC and metabolic panel.  Lower extremity runoff  with high-grade stenosis.  She is started on heparin and admitted to medicine for vascular evaluation in the morning.  Clinical Course as of 09/03/22 0001  Thu Sep 02, 2022  2359 I consult with hospitalist, who agrees to admit [DS]    Clinical Course User Index [DS] SVladimir Crofts MD     FINAL CLINICAL IMPRESSION(S) / ED DIAGNOSES   Final diagnoses:  None     Rx / DC Orders   ED Discharge Orders     None        Note:  This document was prepared using Dragon voice recognition software and may include unintentional dictation errors.   SVladimir Crofts MD 09/03/22 [phone]

## 2022-09-03 ENCOUNTER — Encounter
Admission: EM | Disposition: A | Discharge status: Home Health | Payer: Self-pay | Source: Home / Self Care | Attending: Internal Medicine

## 2022-09-03 ENCOUNTER — Encounter: Payer: Self-pay | Admitting: Internal Medicine

## 2022-09-03 DIAGNOSIS — F17210 Nicotine dependence, cigarettes, uncomplicated: Secondary | ICD-10-CM | POA: Diagnosis present

## 2022-09-03 DIAGNOSIS — L97529 Non-pressure chronic ulcer of other part of left foot with unspecified severity: Secondary | ICD-10-CM

## 2022-09-03 DIAGNOSIS — Z7984 Long term (current) use of oral hypoglycemic drugs: Secondary | ICD-10-CM | POA: Diagnosis not present

## 2022-09-03 DIAGNOSIS — Z9889 Other specified postprocedural states: Secondary | ICD-10-CM | POA: Diagnosis not present

## 2022-09-03 DIAGNOSIS — Z886 Allergy status to analgesic agent status: Secondary | ICD-10-CM | POA: Diagnosis not present

## 2022-09-03 DIAGNOSIS — K5903 Drug induced constipation: Secondary | ICD-10-CM | POA: Diagnosis not present

## 2022-09-03 DIAGNOSIS — I739 Peripheral vascular disease, unspecified: Secondary | ICD-10-CM

## 2022-09-03 DIAGNOSIS — I70223 Atherosclerosis of native arteries of extremities with rest pain, bilateral legs: Principal | ICD-10-CM | POA: Insufficient documentation

## 2022-09-03 DIAGNOSIS — I70245 Atherosclerosis of native arteries of left leg with ulceration of other part of foot: Secondary | ICD-10-CM

## 2022-09-03 DIAGNOSIS — I70221 Atherosclerosis of native arteries of extremities with rest pain, right leg: Secondary | ICD-10-CM | POA: Diagnosis present

## 2022-09-03 DIAGNOSIS — F32A Depression, unspecified: Secondary | ICD-10-CM | POA: Diagnosis present

## 2022-09-03 DIAGNOSIS — I701 Atherosclerosis of renal artery: Secondary | ICD-10-CM | POA: Diagnosis not present

## 2022-09-03 DIAGNOSIS — J45909 Unspecified asthma, uncomplicated: Secondary | ICD-10-CM | POA: Diagnosis present

## 2022-09-03 DIAGNOSIS — I70238 Atherosclerosis of native arteries of right leg with ulceration of other part of lower right leg: Secondary | ICD-10-CM | POA: Diagnosis not present

## 2022-09-03 DIAGNOSIS — E669 Obesity, unspecified: Secondary | ICD-10-CM | POA: Diagnosis present

## 2022-09-03 DIAGNOSIS — I70222 Atherosclerosis of native arteries of extremities with rest pain, left leg: Secondary | ICD-10-CM | POA: Diagnosis not present

## 2022-09-03 DIAGNOSIS — E785 Hyperlipidemia, unspecified: Secondary | ICD-10-CM | POA: Diagnosis present

## 2022-09-03 DIAGNOSIS — D75839 Thrombocytosis, unspecified: Secondary | ICD-10-CM | POA: Diagnosis present

## 2022-09-03 DIAGNOSIS — E1151 Type 2 diabetes mellitus with diabetic peripheral angiopathy without gangrene: Secondary | ICD-10-CM | POA: Diagnosis present

## 2022-09-03 DIAGNOSIS — Z803 Family history of malignant neoplasm of breast: Secondary | ICD-10-CM | POA: Diagnosis not present

## 2022-09-03 DIAGNOSIS — E11621 Type 2 diabetes mellitus with foot ulcer: Secondary | ICD-10-CM | POA: Diagnosis present

## 2022-09-03 DIAGNOSIS — Z794 Long term (current) use of insulin: Secondary | ICD-10-CM | POA: Diagnosis not present

## 2022-09-03 DIAGNOSIS — E1165 Type 2 diabetes mellitus with hyperglycemia: Secondary | ICD-10-CM | POA: Diagnosis present

## 2022-09-03 DIAGNOSIS — K5900 Constipation, unspecified: Secondary | ICD-10-CM | POA: Diagnosis not present

## 2022-09-03 DIAGNOSIS — Z6839 Body mass index (BMI) 39.0-39.9, adult: Secondary | ICD-10-CM | POA: Diagnosis not present

## 2022-09-03 DIAGNOSIS — Z79899 Other long term (current) drug therapy: Secondary | ICD-10-CM | POA: Diagnosis not present

## 2022-09-03 DIAGNOSIS — Z9049 Acquired absence of other specified parts of digestive tract: Secondary | ICD-10-CM | POA: Diagnosis not present

## 2022-09-03 DIAGNOSIS — I2510 Atherosclerotic heart disease of native coronary artery without angina pectoris: Secondary | ICD-10-CM | POA: Diagnosis present

## 2022-09-03 DIAGNOSIS — E0865 Diabetes mellitus due to underlying condition with hyperglycemia: Secondary | ICD-10-CM | POA: Diagnosis not present

## 2022-09-03 DIAGNOSIS — I10 Essential (primary) hypertension: Secondary | ICD-10-CM | POA: Diagnosis present

## 2022-09-03 HISTORY — DX: Peripheral vascular disease, unspecified: I73.9

## 2022-09-03 HISTORY — PX: LOWER EXTREMITY ANGIOGRAPHY: CATH118251

## 2022-09-03 LAB — CBC
HCT: 40.2 % (ref 36.0–46.0)
Hemoglobin: 13.3 g/dL (ref 12.0–15.0)
MCH: 31.4 pg (ref 26.0–34.0)
MCHC: 33.1 g/dL (ref 30.0–36.0)
MCV: 94.8 fL (ref 80.0–100.0)
Platelets: 440 10*3/uL — ABNORMAL HIGH (ref 150–400)
RBC: 4.24 MIL/uL (ref 3.87–5.11)
RDW: 13.9 % (ref 11.5–15.5)
WBC: 6.3 10*3/uL (ref 4.0–10.5)
nRBC: 0 % (ref 0.0–0.2)

## 2022-09-03 LAB — GLUCOSE, CAPILLARY
Glucose-Capillary: 212 mg/dL — ABNORMAL HIGH (ref 70–99)
Glucose-Capillary: 250 mg/dL — ABNORMAL HIGH (ref 70–99)
Glucose-Capillary: 287 mg/dL — ABNORMAL HIGH (ref 70–99)

## 2022-09-03 LAB — BASIC METABOLIC PANEL
Anion gap: 11 (ref 5–15)
BUN: 11 mg/dL (ref 8–23)
CO2: 25 mmol/L (ref 22–32)
Calcium: 9.2 mg/dL (ref 8.9–10.3)
Chloride: 98 mmol/L (ref 98–111)
Creatinine, Ser: 0.7 mg/dL (ref 0.44–1.00)
GFR, Estimated: >60 mL/min (ref >60)
Glucose, Bld: 325 mg/dL — ABNORMAL HIGH (ref 70–99)
Potassium: 4.2 mmol/L (ref 3.5–5.1)
Sodium: 134 mmol/L — ABNORMAL LOW (ref 135–145)

## 2022-09-03 LAB — HEPARIN LEVEL (UNFRACTIONATED)
Heparin Unfractionated: 0.53 IU/mL (ref 0.30–0.70)
Heparin Unfractionated: 0.47 IU/mL (ref 0.30–0.70)

## 2022-09-03 LAB — HEMOGLOBIN A1C
Hgb A1c MFr Bld: 8.2 % — ABNORMAL HIGH (ref 4.8–5.6)
Mean Plasma Glucose: 188.64 mg/dL

## 2022-09-03 LAB — CREATININE, SERUM
Creatinine, Ser: 0.8 mg/dL (ref 0.44–1.00)
GFR, Estimated: >60 mL/min (ref >60)

## 2022-09-03 LAB — CBG MONITORING, ED
Glucose-Capillary: 308 mg/dL — ABNORMAL HIGH (ref 70–99)
Glucose-Capillary: 237 mg/dL — ABNORMAL HIGH (ref 70–99)

## 2022-09-03 SURGERY — LOWER EXTREMITY ANGIOGRAPHY
Anesthesia: Moderate Sedation | Laterality: Left

## 2022-09-03 MED ORDER — ACETAMINOPHEN 650 MG RE SUPP: 650.0000 mg | Freq: Four times a day (QID) | RECTAL | Status: DC | PRN

## 2022-09-03 MED ORDER — DIPHENHYDRAMINE HCL 50 MG/ML IJ SOLN
INTRAMUSCULAR | Status: AC
  Filled 2022-09-03: qty 1

## 2022-09-03 MED ORDER — ACETAMINOPHEN 325 MG PO TABS
650.0000 mg | ORAL_TABLET | Freq: Four times a day (QID) | ORAL | Status: DC | PRN
  Administered 2022-09-04 – 2022-09-07 (×2): 650 mg via ORAL
  Filled 2022-09-03 (×2): qty 2

## 2022-09-03 MED ORDER — FAMOTIDINE 20 MG PO TABS: 40.0000 mg | ORAL_TABLET | Freq: Once | ORAL | Status: DC | PRN

## 2022-09-03 MED ORDER — FLUOXETINE HCL 20 MG PO CAPS
40.0000 mg | ORAL_CAPSULE | Freq: Every day | ORAL | Status: DC
  Administered 2022-09-03 – 2022-09-10 (×7): 40 mg via ORAL
  Filled 2022-09-03 (×8): qty 2

## 2022-09-03 MED ORDER — ATORVASTATIN CALCIUM 10 MG PO TABS
10.0000 mg | ORAL_TABLET | Freq: Every day | ORAL | Status: DC
  Administered 2022-09-03 – 2022-09-09 (×7): 10 mg via ORAL
  Filled 2022-09-03 (×7): qty 1

## 2022-09-03 MED ORDER — INSULIN ASPART 100 UNIT/ML IJ SOLN
0.0000 [IU] | Freq: Three times a day (TID) | INTRAMUSCULAR | Status: DC
  Administered 2022-09-03: 5 [IU] via SUBCUTANEOUS
  Administered 2022-09-03 – 2022-09-04 (×2): 11 [IU] via SUBCUTANEOUS
  Filled 2022-09-03 (×3): qty 1

## 2022-09-03 MED ORDER — MIDAZOLAM HCL 2 MG/2ML IJ SOLN
INTRAMUSCULAR | Status: AC
  Filled 2022-09-03: qty 2

## 2022-09-03 MED ORDER — MORPHINE SULFATE (PF) 2 MG/ML IV SOLN
1.0000 mg | Freq: Once | INTRAVENOUS | Status: AC
  Administered 2022-09-03: 1 mg via INTRAVENOUS
  Filled 2022-09-03: qty 1

## 2022-09-03 MED ORDER — METHYLPREDNISOLONE SODIUM SUCC 125 MG IJ SOLR: 125.0000 mg | Freq: Once | INTRAMUSCULAR | Status: DC | PRN

## 2022-09-03 MED ORDER — CEFAZOLIN SODIUM-DEXTROSE 2-4 GM/100ML-% IV SOLN: 2.0000 g | INTRAVENOUS | Status: AC

## 2022-09-03 MED ORDER — OXYCODONE-ACETAMINOPHEN 7.5-325 MG PO TABS
1.0000 | ORAL_TABLET | ORAL | Status: DC | PRN
  Administered 2022-09-03: 1 via ORAL
  Filled 2022-09-03: qty 1

## 2022-09-03 MED ORDER — LISINOPRIL-HYDROCHLOROTHIAZIDE 20-25 MG PO TABS: 1.0000 | ORAL_TABLET | Freq: Every day | ORAL | Status: DC

## 2022-09-03 MED ORDER — CEFAZOLIN SODIUM-DEXTROSE 2-4 GM/100ML-% IV SOLN
INTRAVENOUS | Status: AC
  Administered 2022-09-03: 2 g via INTRAVENOUS
  Filled 2022-09-03: qty 100

## 2022-09-03 MED ORDER — DIPHENHYDRAMINE HCL 50 MG/ML IJ SOLN: 50.0000 mg | Freq: Once | INTRAMUSCULAR | Status: DC | PRN

## 2022-09-03 MED ORDER — SEMAGLUTIDE(0.25 OR 0.5MG/DOS) 2 MG/1.5ML ~~LOC~~ SOPN: 2.0000 mg | PEN_INJECTOR | Freq: Every day | SUBCUTANEOUS | Status: DC

## 2022-09-03 MED ORDER — AMITRIPTYLINE HCL 10 MG PO TABS
10.0000 mg | ORAL_TABLET | Freq: Every evening | ORAL | Status: DC | PRN
  Administered 2022-09-04: 10 mg via ORAL
  Filled 2022-09-03 (×2): qty 1

## 2022-09-03 MED ORDER — TRAZODONE HCL 50 MG PO TABS
25.0000 mg | ORAL_TABLET | Freq: Every evening | ORAL | Status: DC | PRN
  Administered 2022-09-09: 25 mg via ORAL
  Filled 2022-09-03: qty 1

## 2022-09-03 MED ORDER — HEPARIN SODIUM (PORCINE) 1000 UNIT/ML IJ SOLN
INTRAMUSCULAR | Status: DC | PRN
  Administered 2022-09-03: 5000 [IU] via INTRAVENOUS

## 2022-09-03 MED ORDER — CLOPIDOGREL BISULFATE 75 MG PO TABS
75.0000 mg | ORAL_TABLET | Freq: Every day | ORAL | Status: DC
  Administered 2022-09-03 – 2022-09-10 (×7): 75 mg via ORAL
  Filled 2022-09-03 (×8): qty 1

## 2022-09-03 MED ORDER — ASPIRIN 81 MG PO TBEC
81.0000 mg | DELAYED_RELEASE_TABLET | Freq: Every day | ORAL | Status: DC
  Administered 2022-09-03 – 2022-09-10 (×7): 81 mg via ORAL
  Filled 2022-09-03 (×8): qty 1

## 2022-09-03 MED ORDER — MIDAZOLAM HCL 2 MG/ML PO SYRP
8.0000 mg | ORAL_SOLUTION | Freq: Once | ORAL | Status: DC | PRN
  Filled 2022-09-03: qty 5

## 2022-09-03 MED ORDER — PANTOPRAZOLE SODIUM 40 MG PO TBEC
40.0000 mg | DELAYED_RELEASE_TABLET | Freq: Every day | ORAL | Status: DC
  Administered 2022-09-03 – 2022-09-10 (×7): 40 mg via ORAL
  Filled 2022-09-03 (×8): qty 1

## 2022-09-03 MED ORDER — FENTANYL CITRATE (PF) 100 MCG/2ML IJ SOLN
INTRAMUSCULAR | Status: AC
  Filled 2022-09-03: qty 2

## 2022-09-03 MED ORDER — IODIXANOL 320 MG/ML IV SOLN
INTRAVENOUS | Status: DC | PRN
  Administered 2022-09-03: 55 mL via INTRA_ARTERIAL

## 2022-09-03 MED ORDER — HEPARIN SODIUM (PORCINE) 1000 UNIT/ML IJ SOLN
INTRAMUSCULAR | Status: AC
  Filled 2022-09-03: qty 10

## 2022-09-03 MED ORDER — ALBUTEROL SULFATE HFA 108 (90 BASE) MCG/ACT IN AERS: 2.0000 | INHALATION_SPRAY | RESPIRATORY_TRACT | Status: DC | PRN

## 2022-09-03 MED ORDER — FENTANYL CITRATE (PF) 100 MCG/2ML IJ SOLN
INTRAMUSCULAR | Status: DC | PRN
  Administered 2022-09-03 (×2): 50 ug via INTRAVENOUS

## 2022-09-03 MED ORDER — ALBUTEROL SULFATE (2.5 MG/3ML) 0.083% IN NEBU: 2.5000 mg | INHALATION_SOLUTION | RESPIRATORY_TRACT | Status: DC | PRN

## 2022-09-03 MED ORDER — ORAL CARE MOUTH RINSE: 15.0000 mL | OROMUCOSAL | Status: DC | PRN

## 2022-09-03 MED ORDER — HYDROCHLOROTHIAZIDE 25 MG PO TABS
25.0000 mg | ORAL_TABLET | Freq: Every day | ORAL | Status: DC
  Administered 2022-09-03 – 2022-09-10 (×7): 25 mg via ORAL
  Filled 2022-09-03 (×8): qty 1

## 2022-09-03 MED ORDER — FENTANYL CITRATE PF 50 MCG/ML IJ SOSY: 12.5000 ug | PREFILLED_SYRINGE | INTRAMUSCULAR | Status: DC | PRN

## 2022-09-03 MED ORDER — DIPHENHYDRAMINE HCL 50 MG/ML IJ SOLN
INTRAMUSCULAR | Status: DC | PRN
  Administered 2022-09-03: 25 mg via INTRAVENOUS

## 2022-09-03 MED ORDER — HYDROMORPHONE HCL 1 MG/ML IJ SOLN
1.0000 mg | Freq: Once | INTRAMUSCULAR | Status: AC | PRN
  Administered 2022-09-05: 1 mg via INTRAVENOUS
  Filled 2022-09-03: qty 1

## 2022-09-03 MED ORDER — SODIUM CHLORIDE 0.45 % IV SOLN: INTRAVENOUS | Status: DC

## 2022-09-03 MED ORDER — MORPHINE SULFATE (PF) 2 MG/ML IV SOLN
1.0000 mg | INTRAVENOUS | Status: DC | PRN
  Administered 2022-09-03 – 2022-09-05 (×7): 1 mg via INTRAVENOUS
  Filled 2022-09-03 (×7): qty 1

## 2022-09-03 MED ORDER — SENNA 8.6 MG PO TABS
1.0000 | ORAL_TABLET | Freq: Two times a day (BID) | ORAL | Status: DC
  Administered 2022-09-03 – 2022-09-10 (×12): 8.6 mg via ORAL
  Filled 2022-09-03 (×15): qty 1

## 2022-09-03 MED ORDER — MIDAZOLAM HCL 2 MG/2ML IJ SOLN
INTRAMUSCULAR | Status: DC | PRN
  Administered 2022-09-03: 1 mg via INTRAVENOUS
  Administered 2022-09-03: 2 mg via INTRAVENOUS

## 2022-09-03 MED ORDER — SODIUM CHLORIDE 0.9 % IV SOLN: INTRAVENOUS | Status: DC

## 2022-09-03 MED ORDER — LISINOPRIL 20 MG PO TABS
20.0000 mg | ORAL_TABLET | Freq: Every day | ORAL | Status: DC
  Administered 2022-09-03 – 2022-09-10 (×7): 20 mg via ORAL
  Filled 2022-09-03 (×6): qty 1
  Filled 2022-09-03: qty 2
  Filled 2022-09-03: qty 1

## 2022-09-03 MED ORDER — ONDANSETRON HCL 4 MG/2ML IJ SOLN: 4.0000 mg | Freq: Four times a day (QID) | INTRAMUSCULAR | Status: DC | PRN

## 2022-09-03 SURGICAL SUPPLY — 23 items
BALLN LUTONIX  018 4X60X130 (BALLOONS) ×1
BALLN LUTONIX 018 4X300X130 (BALLOONS) ×1
BALLN LUTONIX 018 4X60X130 (BALLOONS) ×1
BALLN LUTONIX 018 5X220X130 (BALLOONS) ×1
BALLOON LUTONIX 018 4X300X130 (BALLOONS) IMPLANT
BALLOON LUTONIX 018 4X60X130 (BALLOONS) IMPLANT
BALLOON LUTONIX 018 5X220X130 (BALLOONS) IMPLANT
CATH ANGIO 5F PIGTAIL 65CM (CATHETERS) IMPLANT
CATH VERT 5X100 (CATHETERS) IMPLANT
DEVICE STARCLOSE SE CLOSURE (Vascular Products) IMPLANT
GLIDEWIRE ADV .035X260CM (WIRE) IMPLANT
KIT ENCORE 26 ADVANTAGE (KITS) IMPLANT
NDL PERC 18GX7CM (NEEDLE) IMPLANT
NEEDLE PERC 18GX7CM (NEEDLE) ×1 IMPLANT
PACK ANGIOGRAPHY (CUSTOM PROCEDURE TRAY) ×1 IMPLANT
SHEATH ANL2 6FRX45 HC (SHEATH) IMPLANT
SHEATH BRITE TIP 5FRX11 (SHEATH) IMPLANT
SHEATH PROBE COVER 6X72 (BAG) IMPLANT
STENT VIABAHN 6X250X120 (Permanent Stent) IMPLANT
SYR MEDRAD MARK 7 150ML (SYRINGE) IMPLANT
TUBING CONTRAST HIGH PRESS 72 (TUBING) IMPLANT
WIRE G V18X300CM (WIRE) IMPLANT
WIRE GUIDERIGHT .035X150 (WIRE) IMPLANT

## 2022-09-03 NOTE — Assessment & Plan Note (Signed)
Last A1C in [facility] 04/2020 with marked elevation. Home regimen includes glucotrol and insulin 70/30.  Plan While in hospital basal insulin and sliding scale.

## 2022-09-03 NOTE — Progress Notes (Signed)
[facility] for heparin infusion Indication: Arterial Occlusion  Allergies  Allergen Reactions   Ibuprofen Other (See Comments)    STOMACHACHE   Nsaids     Other reaction(s): Other (See Comments) PUD    Patient Measurements: Height: 5' (152.4 cm) Weight: 90.7 kg (200 lb) IBW/kg (Calculated) : 45.5 Heparin Dosing Weight: 67 kg  Vital Signs: Temp: 97.8 F (36.6 C) (10/20 1639) Temp Source: Oral (10/20 1522) BP: 128/59 (10/20 1639) Pulse Rate: 83 (10/20 1639)  Labs: Recent Labs    09/02/22 1738 09/02/22 2322 09/03/22 0627 09/03/22 0927 09/03/22 1550  HGB 13.7  --   --  13.3  --   HCT 41.7  --   --  40.2  --   PLT 493*  --   --  440*  --   APTT  --  36  --   --   --   LABPROT  --  13.0  --   --   --   INR  --  1.0  --   --   --   HEPARINUNFRC  --   --  0.53  --  0.47  CREATININE 0.75  --  0.80 0.70  --      Estimated Creatinine Clearance: 67.6 mL/min (by C-G formula based on SCr of 0.7 mg/dL).   Medical History: Past Medical History:  Diagnosis Date   Asthma    Coronary artery disease    Diabetes mellitus without complication ([place]) 8413   Hypertension    PAD (peripheral artery disease) ([place]) 09/03/2022    Assessment: Pt is a 66 yo female presenting to ED c/o leg pain found w/ "likely occlusion of the bilateral superficial femoral arteries with distal reconstitution of the superior femoral/popliteal arteries." No chronic anticoagulation PTA noted from chart review. Pt assessed to have symptomatic bilateral PAD w/ ulceration. Underwent LL angiogram on 10/20 with stenting, and possible RL angiogram planned for next week. Hgb, HCT, PLT stable.  Baseline Labs: aPTT - 36; INR - 1.0 Hgb - 13.7; Plts - 493  Goal of Therapy:  Heparin level 0.3-0.7 units/ml Monitor platelets by anticoagulation protocol: Yes   10/20'@0627'$ , HL 0.53, therapeutic x 1 10/20'@1619'$ , HL 0.47, therapeutic x 2  Plan:  Continue heparin infusion at  1100 units/hr Check HL next with AM labs on 10/21, now that HL has been therapeutic x 2 Monitor CBC daily while on heparin  Dara Hoyer, PharmD PGY-1 Pharmacy Resident 09/03/2022 6:12 PM

## 2022-09-03 NOTE — Progress Notes (Addendum)
[facility] for heparin infusion Indication: Arterial Occlusion  Allergies  Allergen Reactions   Ibuprofen Other (See Comments)    STOMACHACHE   Nsaids     Other reaction(s): Other (See Comments) PUD    Patient Measurements: Height: 5' (152.4 cm) Weight: 90.7 kg (200 lb) IBW/kg (Calculated) : 45.5 Heparin Dosing Weight: 67 kg  Vital Signs: Temp: 98.2 F (36.8 C) (10/19 2026) Temp Source: Oral (10/19 2026) BP: 154/73 (10/20 0530) Pulse Rate: 91 (10/20 0530)  Labs: Recent Labs    09/02/22 1738 09/02/22 2322 09/03/22 0627  HGB 13.7  --   --   HCT 41.7  --   --   PLT 493*  --   --   APTT  --  36  --   LABPROT  --  13.0  --   INR  --  1.0  --   HEPARINUNFRC  --   --  0.53  CREATININE 0.75  --  0.80     Estimated Creatinine Clearance: 67.6 mL/min (by C-G formula based on SCr of 0.8 mg/dL).   Medical History: Past Medical History:  Diagnosis Date   Asthma    Coronary artery disease    Diabetes mellitus without complication ([place]) 6387   Hypertension    PAD (peripheral artery disease) ([place]) 09/03/2022    Assessment: Pt is a 66 yo female presenting to ED c/o leg pain found w/ "likely occlusion of the bilateral superficial femoral arteries with distal reconstitution of the superior femoral/popliteal arteries."  Goal of Therapy:  Heparin level 0.3-0.7 units/ml Monitor platelets by anticoagulation protocol: Yes   10/20'@0627'$ , HL 0.53, therapeutic x 1  Plan:  Continue heparin infusion at 1100 units/hr Will check confirmatory HL in 6 hrs CBC daily while on heparin  Pearla Dubonnet, PharmD Clinical Pharmacist 09/03/2022 7:26 AM

## 2022-09-03 NOTE — ED Notes (Signed)
Patient to preop

## 2022-09-03 NOTE — Subjective & Objective (Signed)
Kristie Olson is a 66 y/o with a history of DM, CAD, HTN and HLD, recurrent pancreatitis. Patient self-reports a lifelong smoking history with continued 1 PPD smoking.  Estimate 50+ pack year smoker history.She presents to the ED due to acute on chronic lower extremity pain.  She reports symptoms of claudication chronically.  Her left leg is worse than her right.  She reports increasing pain over the past 1 week and rest pain over the past few days.  No falls or injuries, fevers.  Reports chronic ulcerations to her lower extremities.

## 2022-09-03 NOTE — Op Note (Signed)
[facility]  Percutaneous Study/Intervention Procedural Note   Date of Surgery: 09/03/2022  Surgeon(s):Jens Siems    Assistants:none  Pre-operative Diagnosis: PAD with ulceration LLE  Post-operative diagnosis:  Same  Procedure(s) Performed:             1.  Ultrasound guidance for vascular access right femoral artery             2.  Catheter placement into left common femoral artery from right femoral approach             3.  Aortogram and selective left lower extremity angiogram             4.  Percutaneous transluminal angioplasty of left SFA and proximal popliteal artery with a pair 4 mm diameter Lutonix drug-coated angioplasty balloons             5.  Stent placement to the left SFA with 6 mm diameter by 25 cm length Viabahn stent  6.  StarClose closure device right femoral artery  EBL: 10 cc  Contrast: 55 cc  Fluoro Time: 4.5 minutes  Moderate Conscious Sedation Time: approximately 46 minutes using 3 mg of Versed and 100 mcg of Fentanyl              Indications:  Patient is a 66 y.o.female with ulceration on the left foot and pain on both feet. The patient has a CT scan suggesting multilevel disease The patient is brought in for angiography for further evaluation and potential treatment.  Due to the limb threatening nature of the situation, angiogram was performed for attempted limb salvage. The patient is aware that if the procedure fails, amputation would be expected.  The patient also understands that even with successful revascularization, amputation may still be required due to the severity of the situation. Risks and benefits are discussed and informed consent is obtained.   Procedure:  The patient was identified and appropriate procedural time out was performed.  The patient was then placed supine on the table and prepped and draped in the usual sterile fashion. Moderate conscious sedation was administered during a face to face encounter with the  patient throughout the procedure with my supervision of the RN administering medicines and monitoring the patient's vital signs, pulse oximetry, telemetry and mental status throughout from the start of the procedure until the patient was taken to the recovery room. Ultrasound was used to evaluate the right common femoral artery.  It was patent .  A digital ultrasound image was acquired.  A Seldinger needle was used to access the right common femoral artery under direct ultrasound guidance and a permanent image was performed.  A 0.035 J wire was advanced without resistance and a 5Fr sheath was placed.  Pigtail catheter was placed into the aorta and an AP aortogram was performed. This demonstrated normal renal arteries.  The aorta and iliac arteries were calcific but only mildly stenotic and not as significant as had been suggested by the official report of the CT scan. I then crossed the aortic bifurcation and advanced to the left femoral head. Selective left lower extremity angiogram was then performed. This demonstrated calcific and mildly stenotic common femoral artery this appeared to be less than 50%.  The proximal left SFA had about an 80% stenosis at the origin.  The vessel normalized over about 8 to 10 cm and then became heavily diseased with a short to medium length occlusion of about 8 to 10 cm and disease proximal  and distal to this.  The below-knee popliteal artery normalized and there was three-vessel runoff distally without significant tibial stenosis identified. It was felt that it was in the patient's best interest to proceed with intervention after these images to avoid a second procedure and a larger amount of contrast and fluoroscopy based off of the findings from the initial angiogram. The patient was systemically heparinized and a 6 [place] Ansell sheath was then placed over the [employer] wire. I then used a Kumpe catheter and the advantage wire to easily cross the occlusion in the left SFA  and proximal popliteal artery and confirm intraluminal flow in the mid popliteal artery.  I then exchanged for a V18 wire.  A 4 mm diameter by 30 cm length Lutonix drug-coated angioplasty balloon was inflated to 12 atm for 1 minute in the proximal popliteal artery up to the proximal SFA.  A 4 mm diameter by 6 cm length Lutonix drug-coated angioplasty balloon was inflated in the common femoral artery most proximal superficial femoral artery to get the origin lesion.  This was inflated to 10 atm for 1 minute.  Completion imaging showed proximally and distally that the flow was good, but in the mid to distal SFA there were multiple areas of greater than 50% stenosis and this was mostly in the area that was occluded.  I elected to place a 6 mm diameter by 25 cm length Viabahn stent and postdilated this with 5 mm balloon with excellent angiographic completion result and less than 10% residual stenosis. I elected to terminate the procedure. The sheath was removed and StarClose closure device was deployed in the right femoral artery with excellent hemostatic result. The patient was taken to the recovery room in stable condition having tolerated the procedure well.  Findings:               Aortogram:  This demonstrated normal renal arteries.  The aorta and iliac arteries were calcific but only mildly stenotic and not as significant as had been suggested by the official report of the CT scan.             Left lower Extremity: This demonstrated calcific and mildly stenotic common femoral artery this appeared to be less than 50%.  The proximal left SFA had about an 80% stenosis at the origin.  The vessel normalized over about 8 to 10 cm and then became heavily diseased with a short to medium length occlusion of about 8 to 10 cm and disease proximal and distal to this.  The below-knee popliteal artery normalized and there was three-vessel runoff distally without significant tibial stenosis identified   Disposition:  Patient was taken to the recovery room in stable condition having tolerated the procedure well.  Complications: None  [name] 09/03/2022 1:52 PM   This note was created with Dragon Medical transcription system. Any errors in dictation are purely unintentional.

## 2022-09-03 NOTE — Consult Note (Signed)
[facility] Vascular Consult Note  MRN : [id number]  Kristie Olson is a 66 y.o. ([birth year]) female who presents with chief complaint of  Chief Complaint  Patient presents with   Leg Pain  .   Consulting Physician: Vladimir Crofts, MD Reason for consult: Limb ischemia History of Present Illness: Kristie Olson is a 66 year old female who has a previous medical history of smoking, diabetes, hypertension, and hyperlipidemia.  The presents to [facility] for leg pain.  She notes that she has been having pain in her bilateral lower extremities for the last 2 months however recently over the last week the pain has steadily worsened to unbearable point for her.  She does report having claudication-like symptoms.  She notes that the left leg is worse than her right.  She also has multiple wounds of various stages on her left lower extremity.  There are also mostly healed wounds on her right lower extremity as well.  The patient underwent CT angiogram independently reviewed by myself which does show bilateral femoral artery occlusions she also has notable iliac disease.  Current Facility-Administered Medications  Medication Dose Route Frequency Provider Last Rate Last Admin   0.45 % sodium chloride infusion   Intravenous Continuous Norins, Heinz Knuckles, MD       acetaminophen (TYLENOL) tablet 650 mg  650 mg Oral Q6H PRN Norins, Heinz Knuckles, MD       Or   acetaminophen (TYLENOL) suppository 650 mg  650 mg Rectal Q6H PRN Norins, Heinz Knuckles, MD       albuterol (PROVENTIL) (2.5 MG/3ML) 0.083% nebulizer solution 2.5 mg  2.5 mg Nebulization Q4H PRN Renda Rolls, RPH       amitriptyline (ELAVIL) tablet 10 mg  10 mg Oral QHS PRN Norins, Heinz Knuckles, MD       FLUoxetine (PROZAC) capsule 40 mg  40 mg Oral Daily Norins, Heinz Knuckles, MD   40 mg at 09/03/22 0906   heparin ADULT infusion 100 units/mL (25000 units/220m)  1,100 Units/hr Intravenous Continuous BRenda Rolls  RPH 11 mL/hr at 09/03/22 0730 1,100 Units/hr at 09/03/22 0730   lisinopril (ZESTRIL) tablet 20 mg  20 mg Oral Daily BRenda Rolls RPH   20 mg at 09/03/22 02353  And   hydrochlorothiazide (HYDRODIURIL) tablet 25 mg  25 mg Oral Daily BRenda Rolls RPH   25 mg at 09/03/22 0906   insulin aspart (novoLOG) injection 0-15 Units  0-15 Units Subcutaneous TID WC Norins, MHeinz Knuckles MD   11 Units at 09/03/22 0900   morphine (PF) 2 MG/ML injection 1 mg  1 mg Intravenous Q4H PRN WWyvonnia Dusky MD   1 mg at 09/03/22 0948   oxyCODONE-acetaminophen (PERCOCET) 7.5-325 MG per tablet 1 tablet  1 tablet Oral Q4H PRN WWyvonnia Dusky MD       pantoprazole (PROTONIX) EC tablet 40 mg  40 mg Oral Daily Norins, MHeinz Knuckles MD   40 mg at 09/03/22 06144  senna (SENOKOT) tablet 8.6 mg  1 tablet Oral BID Norins, MHeinz Knuckles MD       traZODone (DESYREL) tablet 25 mg  25 mg Oral QHS PRN Norins, MHeinz Knuckles MD       Current Outpatient Medications  Medication Sig Dispense Refill   albuterol (PROVENTIL HFA;VENTOLIN HFA) 108 (90 Base) MCG/ACT inhaler Inhale 2 puffs into the lungs every 4 (four) hours as needed for wheezing or shortness of breath.     amitriptyline (  ELAVIL) 100 MG tablet Take 10 mg by mouth at bedtime as needed for sleep.     esomeprazole (NEXIUM) 20 MG capsule Take 20 mg by mouth daily at 12 noon.     FLUoxetine (PROZAC) 40 MG capsule Take 1 capsule by mouth daily.     glipiZIDE (GLUCOTROL) 10 MG tablet Take 10 mg by mouth daily as needed.     insulin NPH-regular Human (70-30) 100 UNIT/ML injection Inject 25-70 Units into the skin in the morning and at bedtime. 70 qam 25 qhs     lisinopril-hydrochlorothiazide (ZESTORETIC) 20-25 MG tablet Take 1 tablet by mouth daily.     OZEMPIC, 0.25 OR 0.5 MG/DOSE, 2 MG/1.5ML SOPN Inject 1.5 mLs into the skin daily.      Past Medical History:  Diagnosis Date   Asthma    Coronary artery disease    Diabetes mellitus without complication ([place]) 8295    Hypertension    PAD (peripheral artery disease) ([place]) 09/03/2022    Past Surgical History:  Procedure Laterality Date   BREAST EXCISIONAL BIOPSY Left 1980's   neg   BREAST MASS EXCISION Left    age 26"s, benign   CESAREAN SECTION     CHOLECYSTECTOMY  2009    Social History Social History   Tobacco Use   Smoking status: Every Day    Packs/day: 0.75    Years: 46.00    Total pack years: 34.50    Types: Cigarettes   Smokeless tobacco: Never  Substance Use Topics   Alcohol use: No    Family History Family History  Problem Relation Age of Onset   Breast cancer Sister 62   Breast cancer Paternal Aunt     Allergies  Allergen Reactions   Ibuprofen Other (See Comments)    STOMACHACHE   Nsaids     Other reaction(s): Other (See Comments) PUD     REVIEW OF SYSTEMS (Negative unless checked)  Constitutional: '[]'$ Weight loss  '[]'$ Fever  '[]'$ Chills Cardiac: '[]'$ Chest pain   '[]'$ Chest pressure   '[]'$ Palpitations   '[]'$ Shortness of breath when laying flat   '[]'$ Shortness of breath at rest   '[]'$ Shortness of breath with exertion. Vascular:  '[]'$ Pain in legs with walking   '[]'$ Pain in legs at rest   '[]'$ Pain in legs when laying flat   '[]'$ Claudication   '[]'$ Pain in feet when walking  '[]'$ Pain in feet at rest  '[]'$ Pain in feet when laying flat   '[]'$ History of DVT   '[]'$ Phlebitis   '[]'$ Swelling in legs   '[]'$ Varicose veins   '[x]'$ Non-healing ulcers Pulmonary:   '[]'$ Uses home oxygen   '[]'$ Productive cough   '[]'$ Hemoptysis   '[]'$ Wheeze  '[]'$ COPD   '[]'$ Asthma Neurologic:  '[]'$ Dizziness  '[]'$ Blackouts   '[]'$ Seizures   '[]'$ History of stroke   '[]'$ History of TIA  '[]'$ Aphasia   '[]'$ Temporary blindness   '[]'$ Dysphagia   '[]'$ Weakness or numbness in arms   '[]'$ Weakness or numbness in legs Musculoskeletal:  '[]'$ Arthritis   '[]'$ Joint swelling   '[]'$ Joint pain   '[]'$ Low back pain Hematologic:  '[]'$ Easy bruising  '[]'$ Easy bleeding   '[]'$ Hypercoagulable state   '[]'$ Anemic  '[]'$ Hepatitis Gastrointestinal:  '[]'$ Blood in stool   '[]'$ Vomiting blood  '[]'$ Gastroesophageal reflux/heartburn    '[]'$ Difficulty swallowing. Genitourinary:  '[]'$ Chronic kidney disease   '[]'$ Difficult urination  '[]'$ Frequent urination  '[]'$ Burning with urination   '[]'$ Blood in urine Skin:  '[x]'$ Rashes   '[x]'$ Ulcers   '[]'$ Wounds Psychological:  '[]'$ History of anxiety   '[]'$  History of major depression.  Physical Examination  Vitals:   09/03/22 0500 09/03/22 0530  09/03/22 0900 09/03/22 0906  BP: (!) 141/72 (!) 154/73 (!) 154/69 (!) 154/69  Pulse: 96 91 90 93  Resp:   20 18  Temp:    98.2 F (36.8 C)  TempSrc:    Oral  SpO2: 95% 94% 94% 95%  Weight:      Height:       Body mass index is 39.06 kg/m. Gen:  WD/WN, NAD Head: [place]/AT, No temporalis wasting. Prominent temp pulse not noted. Ear/Nose/Throat: Hearing grossly intact, nares w/o erythema or drainage, oropharynx w/o Erythema/Exudate Eyes: Sclera non-icteric, conjunctiva clear Neck: Trachea midline.  No JVD.  Pulmonary:  Good air movement, respirations not labored, equal bilaterally.  Cardiac: RRR, normal S1, S2. Vascular:  Vessel Right Left  PT Not Palpable Not Palpable  DP Not Palpable Not Palpable   Gastrointestinal: soft, non-tender/non-distended. No guarding/reflex.  Musculoskeletal: M/S 5/5 throughout.  Extremities without ischemic changes.  No deformity or atrophy. No edema. Neurologic: Sensation grossly intact in extremities.  Symmetrical.  Speech is fluent. Motor exam as listed above. Psychiatric: Judgment intact, Mood & affect appropriate for pt's clinical situation. Dermatologic: Multiple wounds bilaterally.  Wounds appear to be a discoid rash-like pattern/distribution Lymph : No Cervical, Axillary, or Inguinal lymphadenopathy.    CBC Lab Results  Component Value Date   WBC 6.3 09/03/2022   HGB 13.3 09/03/2022   HCT 40.2 09/03/2022   MCV 94.8 09/03/2022   PLT 440 (H) 09/03/2022    BMET    Component Value Date/Time   NA 134 (L) 09/03/2022 0927   NA 132 (L) 07/14/2013 2143   K 4.2 09/03/2022 0927   K 4.3 07/14/2013 2143   CL 98  09/03/2022 0927   CL 95 (L) 07/14/2013 2143   CO2 25 09/03/2022 0927   CO2 31 07/14/2013 2143   GLUCOSE 325 (H) 09/03/2022 0927   GLUCOSE 433 (H) 07/14/2013 2143   BUN 11 09/03/2022 0927   BUN 12 07/14/2013 2143   CREATININE 0.70 09/03/2022 0927   CREATININE 0.91 07/14/2013 2143   CALCIUM 9.2 09/03/2022 0927   CALCIUM 9.3 07/14/2013 2143   GFRNONAA >60 09/03/2022 0927   GFRNONAA >60 07/14/2013 2143   GFRAA >60 07/01/2020 1636   GFRAA >60 07/14/2013 2143   Estimated Creatinine Clearance: 67.6 mL/min (by C-G formula based on SCr of 0.7 mg/dL).  COAG Lab Results  Component Value Date   INR 1.0 09/02/2022   INR 0.90 07/27/2017    Radiology CT Angio Aortobifemoral W and/or Wo Contrast  Result Date: 09/02/2022 CLINICAL DATA:  Bilateral lower extremity wounds, discoloration, pain EXAM: CT ANGIOGRAPHY OF ABDOMINAL AORTA WITH ILIOFEMORAL RUNOFF TECHNIQUE: Multidetector CT imaging of the abdomen, pelvis and lower extremities was performed using the standard protocol during bolus administration of intravenous contrast. Multiplanar CT image reconstructions and MIPs were obtained to evaluate the vascular anatomy. RADIATION DOSE REDUCTION: This exam was performed according to the departmental dose-optimization program which includes automated exposure control, adjustment of the mA and/or kV according to patient size and/or use of iterative reconstruction technique. CONTRAST:  18m OMNIPAQUE IOHEXOL 350 MG/ML SOLN COMPARISON:  None Available. FINDINGS: VASCULAR Aorta: Normal caliber aorta without aneurysm, dissection, vasculitis or significant stenosis. Calcified athero sclerotic plaque. Celiac: Patent without evidence of aneurysm, dissection, vasculitis or significant stenosis. SMA: Patent without evidence of aneurysm, dissection, vasculitis or significant stenosis. Renals: Both renal arteries are patent without evidence of aneurysm, dissection, vasculitis, fibromuscular dysplasia or significant  stenosis. IMA: Patent without evidence of aneurysm, dissection, vasculitis or significant stenosis.  RIGHT Lower Extremity Inflow: Calcified plaque causes mild-moderate stenosis of the proximal common iliac artery. Moderate-severe narrowing of the right internal iliac artery. No aneurysm or dissection. Outflow: Calcified plaque causes up to moderate narrowing of the common femoral artery and advanced narrowing of the superficial femoral artery which appears occluded distally. Reconstitution of the distal superficial femoral artery and popliteal artery. Runoff: Patent three vessel runoff to the ankle. LEFT Lower Extremity Inflow: Calcified plaque causes mild-moderate stenosis of the proximal common iliac artery. Moderate-severe narrowing of the right internal iliac artery. No aneurysm or dissection. Outflow: Calcified plaque causes up to moderate narrowing of the common femoral artery and advanced narrowing of the superficial femoral artery which appears occluded distally. Reconstitution of the distal superficial femoral artery and popliteal artery. Runoff: Patent three vessel runoff to the ankle. Veins: No obvious venous abnormality within the limitations of this arterial phase study. Review of the MIP images confirms the above findings. NON-VASCULAR Lower chest: Coronary artery calcification. Normal heart size. No pericardial effusion. Hepatobiliary: No focal liver abnormality is seen. Status post cholecystectomy. No biliary dilatation. Pancreas: Unremarkable. No pancreatic ductal dilatation or surrounding inflammatory changes. Spleen: Normal in size without focal abnormality. Adrenals/Urinary Tract: Unchanged nodules in both adrenal glands likely representing adenomas. Kidneys are normal, without renal calculi, focal lesion, or hydronephrosis. Low-attenuation lesions in the kidneys are statistically likely to represent cysts. No follow-up is required. Bladder is unremarkable. Stomach/Bowel: Stomach is within normal  limits. No evidence of bowel wall thickening, distention, or inflammatory changes. Lymphatic: No significant adenopathy. Reproductive: Unremarkable. Other: No free intraperitoneal fluid or air. Musculoskeletal: No acute osseous abnormality. Foci of soft tissue thickening and adjacent stranding in the anterior abdominal fat about the lower abdomen bilaterally (series 4/image 116). IMPRESSION: VASCULAR Advanced narrowing and likely occlusion of the bilateral superficial femoral arteries with distal reconstitution of the superior femoral/popliteal arteries. Patent three-vessel runoff of both lower extremities. Coronary artery and Aortic Atherosclerosis (ICD10-I70.0). NON-VASCULAR No acute abnormality in the abdomen or pelvis. Foci of soft tissue thickening and adjacent stranding in the lower anterior abdominal wall fat bilaterally. Correlate for panniculitis. Electronically Signed   By: Placido Sou M.D.   On: 09/02/2022 22:09   DG Tibia/Fibula Left  Result Date: 09/02/2022 CLINICAL DATA:  Wound in the distal left lower extremity. EXAM: LEFT TIBIA AND FIBULA - 2 VIEW COMPARISON:  None Available. FINDINGS: No acute fracture or dislocation. Old appearing fracture of the proximal tibia extending from the tibial spine into the proximal tibial diaphysis. The bones are mildly osteopenic. No significant joint effusion. No periosteal elevation or bone erosion to suggest acute osteomyelitis. Mild soft tissue thickening over the anterior ankle. Faint focus of low attenuation in the skin anterior to the distal tibia likely corresponds to the wound. No radiopaque foreign object. IMPRESSION: 1. No acute fracture or dislocation. 2. No radiographic evidence of acute osteomyelitis. 3. Old appearing fracture of the proximal tibia. Electronically Signed   By: Anner Crete M.D.   On: 09/02/2022 18:06      Assessment/Plan 1.  Limb ischemia  The patient's description of symptoms is concerning for rest pain like symptoms.   CT scan reveals that she likely has bilateral femoral occlusions as well as extensive disease bilaterally.  Currently the patient has more open wounds on the left lower extremity and has extensive pain in the left lower extremity.  We will proceed with planning a left lower extremity angiogram today and possible right lower extremity angiogram next week.  I have  discussed the risks, benefits and alternatives with the patient and she is agreeable to proceeding.  2.  Discoid rash  The patient has a discoid appearing rash all over her bilateral lower extremities.  This may be a lupus related, nummular eczema, or granuloma annulare.  Based on some of the patient's description of symptoms there may be an underlying infection component as well.  Recommend further evaluation by primary service  3.  Diabetes  Sliding scale insulin as directed by primary  Plan of care discussed with Dr. Lucky Cowboy  and he is in agreement with plan noted above.   Family Communication:  Total Time:80 minutes I spent 80 minutes in this encounter including personally reviewing extensive medical records, personally reviewing imaging studies and compared to prior scans, counseling the patient, placing orders, coordinating care and performing appropriate documentation  Thank you for allowing [place] to participate in the care of this patient.   Kris Hartmann, NP [facility] [phone] (Office Phone) [phone] (Office Fax) [phone] (Pager)  09/03/2022 10:11 AM  Staff may message me via secure chat in [place]  but this may not receive immediate response,  please page for urgent matters!  Dictation software was used to generate the above note. Typos may occur and escape review, as with typed/written notes. Any error is purely unintentional.  Please contact me directly for clarity if needed.

## 2022-09-03 NOTE — ED Notes (Signed)
Pt resting calm and comfortably at this time. NAD noted at this time. Bilateral feet are warm to touch and cap refill is less than 3. Pt is A&Ox4.

## 2022-09-03 NOTE — ED Notes (Signed)
Notified Juliann Pulse, RN in preop about repeat heparin level - per kathy heparin is stopped and she will not return on heparin.

## 2022-09-03 NOTE — Progress Notes (Signed)
Dr. Lucky Cowboy at bedside, speaking with pt. And spouse Truddie Coco re: procedural results. Pt. "Sleepy" still: spouse verbalized understanding of conversation.

## 2022-09-03 NOTE — Progress Notes (Signed)
PROGRESS NOTE    Trenee Igoe Casa  QZE:[id number] DOB: 07/23/1956 DOA: 09/02/2022 PCP: [facility]    Assessment & Plan:   Principal Problem:   Claudication of both lower extremities ([place]) Active Problems:   Hypertension   Diabetes mellitus with hyperglycemia ([place])   PAD (peripheral artery disease) ([place])  Assessment and Plan: PAD: presented w/ leg leg pain, claudication & chronic ulcerations. 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 Will go for surgery today as per vasc surg.  Continue on IV heparin  DM2: poorly controlled, HbA1c 8.2. Continue on SSI w/ accuchecks  HTN: continue on home dose of lisinopril-HCTZ   Obesity: BMI 39.0. Complicates overall care & prognosis   Thrombocytosis: etiology unclear, likely reactive. Will continue to monitor   DVT prophylaxis: IV heparin  Code Status: full  Family Communication:  Disposition Plan:  depends on PT/OT recs (not consulted yet)  Level of care: Med-Surg  Status is: Inpatient Remains inpatient appropriate because: severity of illness   Consultants:  Vasc surg   Procedures:   Antimicrobials:    Subjective: Pt c/o being hungry & leg pain   Objective: Vitals:   09/03/22 1415 09/03/22 1430 09/03/22 1444 09/03/22 1522  BP: (!) 106/58 130/63 119/63 115/60  Pulse: 86 86  89  Resp: '15 17  16  '$ Temp:    (!) 97.3 F (36.3 C)  TempSrc:    Oral  SpO2: 95% 96%  100%  Weight:      Height:        Intake/Output Summary (Last 24 hours) at 09/03/2022 1637 Last data filed at 09/03/2022 1231 Gross per 24 hour  Intake --  Output 425 ml  Net -425 ml   Filed Weights   09/02/22 1724  Weight: 90.7 kg    Examination:  General exam: Appears calm and comfortable  Respiratory system: Clear to auscultation. Respiratory effort normal. Cardiovascular system: S1 & S2 +. No rubs, gallops or clicks.  Gastrointestinal system:  Abdomen is obese, soft and nontender. Normal bowel sounds heard. Central nervous system: Alert and oriented. Moves all extremities  Psychiatry: Judgement and insight appear normal. Mood & affect appropriate.     Data Reviewed: I have personally reviewed following labs and imaging studies  CBC: Recent Labs  Lab 09/02/22 1738 09/03/22 0927  WBC 9.0 6.3  HGB 13.7 13.3  HCT 41.7 40.2  MCV 95.4 94.8  PLT 493* 226*   Basic Metabolic Panel: Recent Labs  Lab 09/02/22 1738 09/03/22 0627 09/03/22 0927  NA 138  --  134*  K 3.9  --  4.2  CL 100  --  98  CO2 29  --  25  GLUCOSE 80  --  325*  BUN 12  --  11  CREATININE 0.75 0.80 0.70  CALCIUM 9.6  --  9.2   GFR: Estimated Creatinine Clearance: 67.6 mL/min (by C-G formula based on SCr of 0.7 mg/dL). Liver Function Tests: No results for input(s): "AST", "ALT", "ALKPHOS", "BILITOT", "PROT", "ALBUMIN" in the last 168 hours. No results for input(s): "LIPASE", "AMYLASE" in the last 168 hours. No results for input(s): "AMMONIA" in the last 168 hours. Coagulation Profile: Recent Labs  Lab 09/02/22 2322  INR 1.0   Cardiac Enzymes: No results for input(s): "CKTOTAL", "CKMB", "CKMBINDEX", "TROPONINI" in the last 168 hours. BNP (last 3 results) No results for input(s): "PROBNP" in the last 8760 hours. HbA1C: Recent Labs    09/02/22 1738  HGBA1C 8.2*   CBG: Recent Labs  Lab 09/03/22 0825 09/03/22 1216 09/03/22 1401  GLUCAP 308* 237* 212*   Lipid Profile: No results for input(s): "CHOL", "HDL", "LDLCALC", "TRIG", "CHOLHDL", "LDLDIRECT" in the last 72 hours. Thyroid Function Tests: No results for input(s): "TSH", "T4TOTAL", "FREET4", "T3FREE", "THYROIDAB" in the last 72 hours. Anemia Panel: No results for input(s): "VITAMINB12", "FOLATE", "FERRITIN", "TIBC", "IRON", "RETICCTPCT" in the last 72 hours. Sepsis Labs: No results for input(s): "PROCALCITON", "LATICACIDVEN" in the last 168 hours.  No results found for this or any  previous visit (from the past 240 hour(s)).       Radiology Studies: PERIPHERAL VASCULAR CATHETERIZATION  Result Date: 09/03/2022 See surgical note for result.  CT Angio Aortobifemoral W and/or Wo Contrast  Result Date: 09/02/2022 CLINICAL DATA:  Bilateral lower extremity wounds, discoloration, pain EXAM: CT ANGIOGRAPHY OF ABDOMINAL AORTA WITH ILIOFEMORAL RUNOFF TECHNIQUE: Multidetector CT imaging of the abdomen, pelvis and lower extremities was performed using the standard protocol during bolus administration of intravenous contrast. Multiplanar CT image reconstructions and MIPs were obtained to evaluate the vascular anatomy. RADIATION DOSE REDUCTION: This exam was performed according to the departmental dose-optimization program which includes automated exposure control, adjustment of the mA and/or kV according to patient size and/or use of iterative reconstruction technique. CONTRAST:  161m OMNIPAQUE IOHEXOL 350 MG/ML SOLN COMPARISON:  None Available. FINDINGS: VASCULAR Aorta: Normal caliber aorta without aneurysm, dissection, vasculitis or significant stenosis. Calcified athero sclerotic plaque. Celiac: Patent without evidence of aneurysm, dissection, vasculitis or significant stenosis. SMA: Patent without evidence of aneurysm, dissection, vasculitis or significant stenosis. Renals: Both renal arteries are patent without evidence of aneurysm, dissection, vasculitis, fibromuscular dysplasia or significant stenosis. IMA: Patent without evidence of aneurysm, dissection, vasculitis or significant stenosis. RIGHT Lower Extremity Inflow: Calcified plaque causes mild-moderate stenosis of the proximal common iliac artery. Moderate-severe narrowing of the right internal iliac artery. No aneurysm or dissection. Outflow: Calcified plaque causes up to moderate narrowing of the common femoral artery and advanced narrowing of the superficial femoral artery which appears occluded distally. Reconstitution of the  distal superficial femoral artery and popliteal artery. Runoff: Patent three vessel runoff to the ankle. LEFT Lower Extremity Inflow: Calcified plaque causes mild-moderate stenosis of the proximal common iliac artery. Moderate-severe narrowing of the right internal iliac artery. No aneurysm or dissection. Outflow: Calcified plaque causes up to moderate narrowing of the common femoral artery and advanced narrowing of the superficial femoral artery which appears occluded distally. Reconstitution of the distal superficial femoral artery and popliteal artery. Runoff: Patent three vessel runoff to the ankle. Veins: No obvious venous abnormality within the limitations of this arterial phase study. Review of the MIP images confirms the above findings. NON-VASCULAR Lower chest: Coronary artery calcification. Normal heart size. No pericardial effusion. Hepatobiliary: No focal liver abnormality is seen. Status post cholecystectomy. No biliary dilatation. Pancreas: Unremarkable. No pancreatic ductal dilatation or surrounding inflammatory changes. Spleen: Normal in size without focal abnormality. Adrenals/Urinary Tract: Unchanged nodules in both adrenal glands likely representing adenomas. Kidneys are normal, without renal calculi, focal lesion, or hydronephrosis. Low-attenuation lesions in the kidneys are statistically likely to represent cysts. No follow-up is required. Bladder is unremarkable. Stomach/Bowel: Stomach is within normal limits. No evidence of bowel wall thickening, distention, or inflammatory changes. Lymphatic: No significant adenopathy. Reproductive: Unremarkable. Other: No free intraperitoneal fluid or air. Musculoskeletal: No acute osseous abnormality. Foci of soft tissue thickening and adjacent stranding in the anterior abdominal fat about the lower abdomen bilaterally (series 4/image 116).  IMPRESSION: VASCULAR Advanced narrowing and likely occlusion of the bilateral superficial femoral arteries with distal  reconstitution of the superior femoral/popliteal arteries. Patent three-vessel runoff of both lower extremities. Coronary artery and Aortic Atherosclerosis (ICD10-I70.0). NON-VASCULAR No acute abnormality in the abdomen or pelvis. Foci of soft tissue thickening and adjacent stranding in the lower anterior abdominal wall fat bilaterally. Correlate for panniculitis. Electronically Signed   By: Placido Sou M.D.   On: 09/02/2022 22:09   DG Tibia/Fibula Left  Result Date: 09/02/2022 CLINICAL DATA:  Wound in the distal left lower extremity. EXAM: LEFT TIBIA AND FIBULA - 2 VIEW COMPARISON:  None Available. FINDINGS: No acute fracture or dislocation. Old appearing fracture of the proximal tibia extending from the tibial spine into the proximal tibial diaphysis. The bones are mildly osteopenic. No significant joint effusion. No periosteal elevation or bone erosion to suggest acute osteomyelitis. Mild soft tissue thickening over the anterior ankle. Faint focus of low attenuation in the skin anterior to the distal tibia likely corresponds to the wound. No radiopaque foreign object. IMPRESSION: 1. No acute fracture or dislocation. 2. No radiographic evidence of acute osteomyelitis. 3. Old appearing fracture of the proximal tibia. Electronically Signed   By: Anner Crete M.D.   On: 09/02/2022 18:06        Scheduled Meds:  aspirin EC  81 mg Oral Daily   atorvastatin  10 mg Oral Daily   clopidogrel  75 mg Oral Daily   diphenhydrAMINE       fentaNYL       FLUoxetine  40 mg Oral Daily   heparin sodium (porcine)       lisinopril  20 mg Oral Daily   And   hydrochlorothiazide  25 mg Oral Daily   insulin aspart  0-15 Units Subcutaneous TID WC   midazolam       midazolam       pantoprazole  40 mg Oral Daily   senna  1 tablet Oral BID   Continuous Infusions:  sodium chloride 50 mL/hr at 09/03/22 1540   heparin 1,100 Units/hr (09/03/22 1142)     LOS: 0 days    Time spent: 35 mins     Wyvonnia Dusky, MD [facility] Pager [phone]  If 7PM-7AM, please contact night-coverage [email or web address] 09/03/2022, 4:37 PM

## 2022-09-03 NOTE — Assessment & Plan Note (Signed)
Adequate control  Plan Continue home regimen

## 2022-09-03 NOTE — Assessment & Plan Note (Signed)
Patient presenting with left leg pain, claudication, chronic ulcerations. NO with worsening pain and limitation in ambulation. CTA reveals advanced narrowing/occlusion bilateral superficial femoral arteries with distal reconstitution and three-vessel run-off.  Plan Continue IV heparin  Vascular surgery to see

## 2022-09-03 NOTE — H&P (Signed)
History and Physical    Kristie Olson KNL:[id number] DOB: 1956-09-07 DOA: 09/02/2022  DOS: the patient was seen and examined on 09/02/2022  PCP: [facility]   Patient coming from: Home  I have personally briefly reviewed patient's old medical records in [facility]  Kristie Olson is a 66 y/o with a history of DM, CAD, HTN and HLD, recurrent pancreatitis. Patient self-reports a lifelong smoking history with continued 1 PPD smoking.  Estimate 50+ pack year smoker history.She presents to the ED due to acute on chronic lower extremity pain.  She reports symptoms of claudication chronically.  Her left leg is worse than her right.  She reports increasing pain over the past 1 week and rest pain over the past few days.  No falls or injuries, fevers.  Reports chronic ulcerations to her lower extremities.   ED Course: 98.2  149/73  91  17. Patient with leg pain but no acute distress otherwise. Leg ulcers noted. Poor DP pulses. Lab - Bmet - n, CBC nl. CTA - advanced narrowing/occlusion bilateral superficial femoral aa with distal reconstitution and 3 vessel runoff. EDP consult vascular surgery who recommended starting Heparin infusion and have [facility] admit. VS will see later in the AM  Review of Systems:  Review of Systems  Constitutional:  Negative for chills, fever and weight loss.  HENT: Negative.    Eyes: Negative.   Respiratory: Negative.    Cardiovascular: Negative.   Gastrointestinal: Negative.   Genitourinary: Negative.   Musculoskeletal:        Bilateral leg pain, left worse than right. Very painful with walking  Skin:        Multiple shallow ulcers both legs. No drainage.   Endo/Heme/Allergies: Negative.   Psychiatric/Behavioral: Negative.      Past Medical History:  Diagnosis Date   Asthma    Coronary artery disease    Diabetes mellitus without complication ([place]) 7902   Hypertension    PAD (peripheral artery disease) ([place]) 09/03/2022    Past Surgical  History:  Procedure Laterality Date   BREAST EXCISIONAL BIOPSY Left 1980's   neg   BREAST MASS EXCISION Left    age 33"s, benign   CESAREAN SECTION     CHOLECYSTECTOMY  2009    Soc Hx - married 51 years. Lives with husband. Grand daughter lives with them intermittently.    reports that she has been smoking cigarettes. She has a 34.50 pack-year smoking history. She has never used smokeless tobacco. She reports that she does not drink alcohol. No history on file for drug use.  Allergies  Allergen Reactions   Ibuprofen Other (See Comments)    STOMACHACHE   Nsaids     Other reaction(s): Other (See Comments) PUD    Family History  Problem Relation Age of Onset   Breast cancer Sister 51   Breast cancer Paternal Aunt     Prior to Admission medications   Medication Sig Start Date End Date Taking? Authorizing Provider  albuterol (PROVENTIL HFA;VENTOLIN HFA) 108 (90 Base) MCG/ACT inhaler Inhale 2 puffs into the lungs every 4 (four) hours as needed for wheezing or shortness of breath.   Yes [provider]  amitriptyline (ELAVIL) 100 MG tablet Take 10 mg by mouth at bedtime as needed for sleep.   Yes [provider]  esomeprazole (NEXIUM) 20 MG capsule Take 20 mg by mouth daily at 12 noon.   Yes [provider]  FLUoxetine (PROZAC) 40 MG capsule Take 1 capsule  by mouth daily.   Yes [provider]  glipiZIDE (GLUCOTROL) 10 MG tablet Take 10 mg by mouth daily as needed.   Yes [provider]  insulin NPH-regular Human (70-30) 100 UNIT/ML injection Inject 25-70 Units into the skin in the morning and at bedtime. 70 qam 25 qhs 08/24/12  Yes [provider]  lisinopril-hydrochlorothiazide (ZESTORETIC) 20-25 MG tablet Take 1 tablet by mouth daily. 08/31/22  Yes [provider]  OZEMPIC, 0.25 OR 0.5 MG/DOSE, 2 MG/1.5ML SOPN Inject 1.5 mLs into the skin daily. 01/20/22  Yes [provider]    Physical Exam: Vitals:    09/02/22 2230 09/02/22 2300 09/03/22 0100 09/03/22 0130  BP: 131/77 (!) 140/75 (!) 142/64 (!) 149/73  Pulse: 88 89 93 91  Resp:   13 17  Temp:      TempSrc:      SpO2: 99% 98% 97% 98%  Weight:      Height:        Physical Exam Vitals and nursing note reviewed.  Constitutional:      General: She is not in acute distress.    Appearance: She is obese.  HENT:     Head: Normocephalic and atraumatic.     Mouth/Throat:     Mouth: Mucous membranes are dry.     Comments: Edentulous with upper denture in place Eyes:     Extraocular Movements: Extraocular movements intact.     Conjunctiva/sclera: Conjunctivae normal.     Pupils: Pupils are equal, round, and reactive to light.  Cardiovascular:     Rate and Rhythm: Normal rate and regular rhythm.     Heart sounds: Normal heart sounds.     Comments: 2+ radial pulses. Trace DP pulse right, could not palpate DP pulse left Pulmonary:     Effort: Pulmonary effort is normal.     Breath sounds: Rhonchi present. No rales.  Abdominal:     General: Bowel sounds are normal.     Palpations: Abdomen is soft.     Tenderness: There is no abdominal tenderness.  Musculoskeletal:        General: Deformity present. No swelling or signs of injury.     Cervical back: Neck supple.     Right lower leg: No edema.     Left lower leg: No edema.  Skin:    General: Skin is warm.     Comments: Distal LE warm, feet warm. Onychyomycosis nails right foot. Distal right foot discolored. Multiple superficial uclerations in various stages both LE - worst distal left leg over the shin.  Neurological:     General: No focal deficit present.     Mental Status: She is alert and oriented to person, place, and time.  Psychiatric:        Mood and Affect: Mood normal.        Behavior: Behavior normal.      Labs on Admission: I have personally reviewed following labs and imaging studies  CBC: Recent Labs  Lab 09/02/22 1738  WBC 9.0  HGB 13.7  HCT 41.7  MCV 95.4   PLT 390*   Basic Metabolic Panel: Recent Labs  Lab 09/02/22 1738  NA 138  K 3.9  CL 100  CO2 29  GLUCOSE 80  BUN 12  CREATININE 0.75  CALCIUM 9.6   GFR: Estimated Creatinine Clearance: 67.6 mL/min (by C-G formula based on SCr of 0.75 mg/dL). Liver Function Tests: No results for input(s): "AST", "ALT", "ALKPHOS", "BILITOT", "PROT", "ALBUMIN" in the last  168 hours. No results for input(s): "LIPASE", "AMYLASE" in the last 168 hours. No results for input(s): "AMMONIA" in the last 168 hours. Coagulation Profile: Recent Labs  Lab 09/02/22 2322  INR 1.0   Cardiac Enzymes: No results for input(s): "CKTOTAL", "CKMB", "CKMBINDEX", "TROPONINI" in the last 168 hours. BNP (last 3 results) No results for input(s): "PROBNP" in the last 8760 hours. HbA1C: No results for input(s): "HGBA1C" in the last 72 hours. CBG: No results for input(s): "GLUCAP" in the last 168 hours. Lipid Profile: No results for input(s): "CHOL", "HDL", "LDLCALC", "TRIG", "CHOLHDL", "LDLDIRECT" in the last 72 hours. Thyroid Function Tests: No results for input(s): "TSH", "T4TOTAL", "FREET4", "T3FREE", "THYROIDAB" in the last 72 hours. Anemia Panel: No results for input(s): "VITAMINB12", "FOLATE", "FERRITIN", "TIBC", "IRON", "RETICCTPCT" in the last 72 hours. Urine analysis:    Component Value Date/Time   COLORURINE STRAW (A) 03/09/2021 2242   APPEARANCEUR CLEAR (A) 03/09/2021 2242   APPEARANCEUR Clear 07/14/2013 2143   LABSPEC 1.022 03/09/2021 2242   LABSPEC 1.025 07/14/2013 2143   PHURINE 7.0 03/09/2021 2242   GLUCOSEU >=500 (A) 03/09/2021 2242   GLUCOSEU >=500 07/14/2013 2143   HGBUR NEGATIVE 03/09/2021 2242   [place] 03/09/2021 2242   BILIRUBINUR Negative 07/14/2013 2143   KETONESUR NEGATIVE 03/09/2021 2242   PROTEINUR NEGATIVE 03/09/2021 2242   NITRITE NEGATIVE 03/09/2021 2242   LEUKOCYTESUR NEGATIVE 03/09/2021 2242   LEUKOCYTESUR 1+ 07/14/2013 2143    Radiological Exams on  Admission: I have personally reviewed images CT Angio Aortobifemoral W and/or Wo Contrast  Result Date: 09/02/2022 CLINICAL DATA:  Bilateral lower extremity wounds, discoloration, pain EXAM: CT ANGIOGRAPHY OF ABDOMINAL AORTA WITH ILIOFEMORAL RUNOFF TECHNIQUE: Multidetector CT imaging of the abdomen, pelvis and lower extremities was performed using the standard protocol during bolus administration of intravenous contrast. Multiplanar CT image reconstructions and MIPs were obtained to evaluate the vascular anatomy. RADIATION DOSE REDUCTION: This exam was performed according to the departmental dose-optimization program which includes automated exposure control, adjustment of the mA and/or kV according to patient size and/or use of iterative reconstruction technique. CONTRAST:  160m OMNIPAQUE IOHEXOL 350 MG/ML SOLN COMPARISON:  None Available. FINDINGS: VASCULAR Aorta: Normal caliber aorta without aneurysm, dissection, vasculitis or significant stenosis. Calcified athero sclerotic plaque. Celiac: Patent without evidence of aneurysm, dissection, vasculitis or significant stenosis. SMA: Patent without evidence of aneurysm, dissection, vasculitis or significant stenosis. Renals: Both renal arteries are patent without evidence of aneurysm, dissection, vasculitis, fibromuscular dysplasia or significant stenosis. IMA: Patent without evidence of aneurysm, dissection, vasculitis or significant stenosis. RIGHT Lower Extremity Inflow: Calcified plaque causes mild-moderate stenosis of the proximal common iliac artery. Moderate-severe narrowing of the right internal iliac artery. No aneurysm or dissection. Outflow: Calcified plaque causes up to moderate narrowing of the common femoral artery and advanced narrowing of the superficial femoral artery which appears occluded distally. Reconstitution of the distal superficial femoral artery and popliteal artery. Runoff: Patent three vessel runoff to the ankle. LEFT Lower Extremity  Inflow: Calcified plaque causes mild-moderate stenosis of the proximal common iliac artery. Moderate-severe narrowing of the right internal iliac artery. No aneurysm or dissection. Outflow: Calcified plaque causes up to moderate narrowing of the common femoral artery and advanced narrowing of the superficial femoral artery which appears occluded distally. Reconstitution of the distal superficial femoral artery and popliteal artery. Runoff: Patent three vessel runoff to the ankle. Veins: No obvious venous abnormality within the limitations of this arterial phase study. Review of the MIP images confirms the above findings. NON-VASCULAR  Lower chest: Coronary artery calcification. Normal heart size. No pericardial effusion. Hepatobiliary: No focal liver abnormality is seen. Status post cholecystectomy. No biliary dilatation. Pancreas: Unremarkable. No pancreatic ductal dilatation or surrounding inflammatory changes. Spleen: Normal in size without focal abnormality. Adrenals/Urinary Tract: Unchanged nodules in both adrenal glands likely representing adenomas. Kidneys are normal, without renal calculi, focal lesion, or hydronephrosis. Low-attenuation lesions in the kidneys are statistically likely to represent cysts. No follow-up is required. Bladder is unremarkable. Stomach/Bowel: Stomach is within normal limits. No evidence of bowel wall thickening, distention, or inflammatory changes. Lymphatic: No significant adenopathy. Reproductive: Unremarkable. Other: No free intraperitoneal fluid or air. Musculoskeletal: No acute osseous abnormality. Foci of soft tissue thickening and adjacent stranding in the anterior abdominal fat about the lower abdomen bilaterally (series 4/image 116). IMPRESSION: VASCULAR Advanced narrowing and likely occlusion of the bilateral superficial femoral arteries with distal reconstitution of the superior femoral/popliteal arteries. Patent three-vessel runoff of both lower extremities. Coronary  artery and Aortic Atherosclerosis (ICD10-I70.0). NON-VASCULAR No acute abnormality in the abdomen or pelvis. Foci of soft tissue thickening and adjacent stranding in the lower anterior abdominal wall fat bilaterally. Correlate for panniculitis. Electronically Signed   By: Placido Sou M.D.   On: 09/02/2022 22:09   DG Tibia/Fibula Left  Result Date: 09/02/2022 CLINICAL DATA:  Wound in the distal left lower extremity. EXAM: LEFT TIBIA AND FIBULA - 2 VIEW COMPARISON:  None Available. FINDINGS: No acute fracture or dislocation. Old appearing fracture of the proximal tibia extending from the tibial spine into the proximal tibial diaphysis. The bones are mildly osteopenic. No significant joint effusion. No periosteal elevation or bone erosion to suggest acute osteomyelitis. Mild soft tissue thickening over the anterior ankle. Faint focus of low attenuation in the skin anterior to the distal tibia likely corresponds to the wound. No radiopaque foreign object. IMPRESSION: 1. No acute fracture or dislocation. 2. No radiographic evidence of acute osteomyelitis. 3. Old appearing fracture of the proximal tibia. Electronically Signed   By: Anner Crete M.D.   On: 09/02/2022 18:06    EKG: I have personally reviewed EKG: no EKG on chart  Assessment/Plan Principal Problem:   Claudication of both lower extremities (HCC) Active Problems:   Hypertension   Diabetes mellitus with hyperglycemia (HCC)   PAD (peripheral artery disease) (HCC)    Assessment and Plan: PAD (peripheral artery disease) ([place]) Patient presenting with left leg pain, claudication, chronic ulcerations. NO with worsening pain and limitation in ambulation. CTA reveals advanced narrowing/occlusion bilateral superficial femoral arteries with distal reconstitution and three-vessel run-off.  Plan Continue IV heparin  Vascular surgery to see  Diabetes mellitus with hyperglycemia ([place]) Last A1C in [facility] 04/2020 with marked elevation. Home  regimen includes glucotrol and insulin 70/30.  Plan While in hospital basal insulin and sliding scale.  Hypertension Adequate control  Plan Continue home regimen       DVT prophylaxis: IV heparin gtts Code Status: Full Code Family Communication: deferred calling husband due to the hour  Disposition Plan: home when stable  Consults called: vascular surgery called by EDP  Admission status: Inpatient, Med-Surg   Adella Hare, MD [facility] 09/03/2022, 2:19 AM

## 2022-09-03 NOTE — Inpatient Diabetes Management (Signed)
Inpatient Diabetes Program Recommendations  AACE/ADA: New Consensus Statement on Inpatient Glycemic Control (2015)  Target Ranges:  Prepandial:   less than 140 mg/dL      Peak postprandial:   less than 180 mg/dL (1-2 hours)      Critically ill patients:  140 - 180 mg/dL    Latest Reference Range & Units 09/03/22 08:25 09/03/22 12:16  Glucose-Capillary 70 - 99 mg/dL 308 (H)  11 units Novolog  237 (H)  (H): Data is abnormally high     Home DM Meds: 70/30 Insulin 70 units AM/25 units PM   Ozempic 2 mg Qweek    Glipizide 10 mg Daily  Current Orders: Novolog 0-15 units TID     MD- Note CBG 308 this AM.  Novolog SSI started at 8am.  Pt takes 70/30 Insulin BID at home.  Please consider starting basal insulin in-hospital and can resume home doses of 70/30 Insulin when pt goes home  Recommend Semglee 20 units QHS to start (0.2 units/kg)   --Will follow patient during hospitalization--  Wyn Quaker RN, MSN, [place] [facility] Coordinator Inpatient Glycemic Control Team Team Pager: [phone] (8a-5p)

## 2022-09-04 DIAGNOSIS — K5903 Drug induced constipation: Secondary | ICD-10-CM

## 2022-09-04 DIAGNOSIS — I739 Peripheral vascular disease, unspecified: Secondary | ICD-10-CM | POA: Diagnosis not present

## 2022-09-04 LAB — BASIC METABOLIC PANEL
Anion gap: 9 (ref 5–15)
BUN: 12 mg/dL (ref 8–23)
CO2: 27 mmol/L (ref 22–32)
Calcium: 8.9 mg/dL (ref 8.9–10.3)
Chloride: 97 mmol/L — ABNORMAL LOW (ref 98–111)
Creatinine, Ser: 0.9 mg/dL (ref 0.44–1.00)
GFR, Estimated: >60 mL/min (ref >60)
Glucose, Bld: 327 mg/dL — ABNORMAL HIGH (ref 70–99)
Potassium: 4.2 mmol/L (ref 3.5–5.1)
Sodium: 133 mmol/L — ABNORMAL LOW (ref 135–145)

## 2022-09-04 LAB — CBC
HCT: 40 % (ref 36.0–46.0)
Hemoglobin: 13.4 g/dL (ref 12.0–15.0)
MCH: 31.5 pg (ref 26.0–34.0)
MCHC: 33.5 g/dL (ref 30.0–36.0)
MCV: 94.1 fL (ref 80.0–100.0)
Platelets: 434 10*3/uL — ABNORMAL HIGH (ref 150–400)
RBC: 4.25 MIL/uL (ref 3.87–5.11)
RDW: 13.4 % (ref 11.5–15.5)
WBC: 9.3 10*3/uL (ref 4.0–10.5)
nRBC: 0 % (ref 0.0–0.2)

## 2022-09-04 LAB — GLUCOSE, CAPILLARY
Glucose-Capillary: 347 mg/dL — ABNORMAL HIGH (ref 70–99)
Glucose-Capillary: 312 mg/dL — ABNORMAL HIGH (ref 70–99)
Glucose-Capillary: 412 mg/dL — ABNORMAL HIGH (ref 70–99)
Glucose-Capillary: 253 mg/dL — ABNORMAL HIGH (ref 70–99)

## 2022-09-04 LAB — HEPARIN LEVEL (UNFRACTIONATED): Heparin Unfractionated: <0.1 IU/mL — ABNORMAL LOW (ref 0.30–0.70)

## 2022-09-04 LAB — HIV ANTIBODY (ROUTINE TESTING W REFLEX): HIV Screen 4th Generation wRfx: NONREACTIVE

## 2022-09-04 MED ORDER — HYDROCODONE-ACETAMINOPHEN 7.5-325 MG PO TABS: 1.0000 | ORAL_TABLET | Freq: Four times a day (QID) | ORAL | Status: DC | PRN

## 2022-09-04 MED ORDER — INSULIN ASPART 100 UNIT/ML IJ SOLN
0.0000 [IU] | Freq: Three times a day (TID) | INTRAMUSCULAR | Status: DC
  Administered 2022-09-04 – 2022-09-05 (×2): 20 [IU] via SUBCUTANEOUS
  Administered 2022-09-05: 15 [IU] via SUBCUTANEOUS
  Administered 2022-09-05: 11 [IU] via SUBCUTANEOUS
  Administered 2022-09-06 (×2): 20 [IU] via SUBCUTANEOUS
  Administered 2022-09-07 (×2): 7 [IU] via SUBCUTANEOUS
  Administered 2022-09-07 – 2022-09-08 (×2): 11 [IU] via SUBCUTANEOUS
  Administered 2022-09-08: 4 [IU] via SUBCUTANEOUS
  Administered 2022-09-09: 7 [IU] via SUBCUTANEOUS
  Administered 2022-09-09: 4 [IU] via SUBCUTANEOUS
  Administered 2022-09-09: 11 [IU] via SUBCUTANEOUS
  Administered 2022-09-10 (×2): 7 [IU] via SUBCUTANEOUS
  Filled 2022-09-04 (×16): qty 1

## 2022-09-04 MED ORDER — DOCUSATE SODIUM 100 MG PO CAPS
200.0000 mg | ORAL_CAPSULE | Freq: Two times a day (BID) | ORAL | Status: DC
  Administered 2022-09-04 – 2022-09-07 (×8): 200 mg via ORAL
  Filled 2022-09-04 (×9): qty 2

## 2022-09-04 MED ORDER — POLYETHYLENE GLYCOL 3350 17 G PO PACK
17.0000 g | PACK | Freq: Every day | ORAL | Status: DC
  Administered 2022-09-04 – 2022-09-10 (×5): 17 g via ORAL
  Filled 2022-09-04 (×7): qty 1

## 2022-09-04 MED ORDER — INSULIN GLARGINE-YFGN 100 UNIT/ML ~~LOC~~ SOLN
20.0000 [IU] | Freq: Every day | SUBCUTANEOUS | Status: DC
  Administered 2022-09-04: 20 [IU] via SUBCUTANEOUS
  Filled 2022-09-04 (×2): qty 0.2

## 2022-09-04 NOTE — Progress Notes (Signed)
PROGRESS NOTE    Kristie Olson  OZH:[id number] DOB: 09-Oct-1956 DOA: 09/02/2022 PCP: [facility]    Assessment & Plan:   Principal Problem:   Claudication of both lower extremities ([place]) Active Problems:   Hypertension   Diabetes mellitus with hyperglycemia ([place])   PAD (peripheral artery disease) ([place])  Assessment and Plan: PAD: presented w/ leg leg pain, claudication & chronic ulcerations. 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 S/p angioplasty of left SFA & proximal popliteal artery and stent placement to left SFA on 09/03/22 as per vasc surg. Will need to have RLE angioplasty as well. Started on plavix and continue on statin   DM2: HbA1c 8.2, poorly controlled. Started on glargine and increased SSI. Pt refuses carb modified diet and requests regular diet. Regular diet as per pt's request   HTN: continue on home dose of HCTZ-lisinopril   Constipation: started on colace, miralax  Obesity: BMI 39.0. Complicates overall care & prognosis  Thrombocytosis: etiology unclear, likely reactive   DVT prophylaxis: lovenox  Code Status: full  Family Communication:  Disposition Plan:  depends on PT/OT recs   Level of care: Med-Surg  Status is: Inpatient Remains inpatient appropriate because: severity of illness   Consultants:  Vasc surg   Procedures:   Antimicrobials:    Subjective: Pt c/o groin pain and bland food  Objective: Vitals:   09/03/22 2350 09/04/22 0032 09/04/22 0345 09/04/22 0753  BP: (!) 142/71 (!) 151/70 (!) 151/63 (!) 145/52  Pulse: 96 (!) 103 (!) 103 (!) 106  Resp: '18  18 18  '$ Temp: 98.5 F (36.9 C) 98.1 F (36.7 C) 98.9 F (37.2 C) 97.8 F (36.6 C)  TempSrc:      SpO2: 99% 98% 99% 97%  Weight:      Height:        Intake/Output Summary (Last 24 hours) at 09/04/2022 0819 Last data filed at 09/04/2022 0348 Gross per 24 hour  Intake 913.16 ml   Output 1225 ml  Net -311.84 ml   Filed Weights   09/02/22 1724  Weight: 90.7 kg    Examination:  General exam: Appears uncomfortable  Respiratory system: clear breath sounds b/l  Cardiovascular system: S1/S2+. No rubs or clicks  Gastrointestinal system: Abd is soft, NT, obese & hypoactive bowel sounds. Central nervous system: alert and oriented. Moves all extremities  Psychiatry: Judgement and insight appears at baseline. Appropriate mood and affect    Data Reviewed: I have personally reviewed following labs and imaging studies  CBC: Recent Labs  Lab 09/02/22 1738 09/03/22 0927 09/04/22 0546  WBC 9.0 6.3 9.3  HGB 13.7 13.3 13.4  HCT 41.7 40.2 40.0  MCV 95.4 94.8 94.1  PLT 493* 440* 629*   Basic Metabolic Panel: Recent Labs  Lab 09/02/22 1738 09/03/22 0627 09/03/22 0927 09/04/22 0546  NA 138  --  134* 133*  K 3.9  --  4.2 4.2  CL 100  --  98 97*  CO2 29  --  25 27  GLUCOSE 80  --  325* 327*  BUN 12  --  11 12  CREATININE 0.75 0.80 0.70 0.90  CALCIUM 9.6  --  9.2 8.9   GFR: Estimated Creatinine Clearance: 60.1 mL/min (by C-G formula based on SCr of 0.9 mg/dL). Liver Function Tests: No results for input(s): "AST", "ALT", "ALKPHOS", "BILITOT", "PROT", "ALBUMIN" in the last 168 hours. No results for input(s): "LIPASE", "AMYLASE" in the last 168 hours.  No results for input(s): "AMMONIA" in the last 168 hours. Coagulation Profile: Recent Labs  Lab 09/02/22 2322  INR 1.0   Cardiac Enzymes: No results for input(s): "CKTOTAL", "CKMB", "CKMBINDEX", "TROPONINI" in the last 168 hours. BNP (last 3 results) No results for input(s): "PROBNP" in the last 8760 hours. HbA1C: Recent Labs    09/02/22 1738  HGBA1C 8.2*   CBG: Recent Labs  Lab 09/03/22 0825 09/03/22 1216 09/03/22 1401 09/03/22 1637 09/03/22 2031  GLUCAP 308* 237* 212* 250* 287*   Lipid Profile: No results for input(s): "CHOL", "HDL", "LDLCALC", "TRIG", "CHOLHDL", "LDLDIRECT" in the last 72  hours. Thyroid Function Tests: No results for input(s): "TSH", "T4TOTAL", "FREET4", "T3FREE", "THYROIDAB" in the last 72 hours. Anemia Panel: No results for input(s): "VITAMINB12", "FOLATE", "FERRITIN", "TIBC", "IRON", "RETICCTPCT" in the last 72 hours. Sepsis Labs: No results for input(s): "PROCALCITON", "LATICACIDVEN" in the last 168 hours.  No results found for this or any previous visit (from the past 240 hour(s)).       Radiology Studies: PERIPHERAL VASCULAR CATHETERIZATION  Result Date: 09/03/2022 See surgical note for result.  CT Angio Aortobifemoral W and/or Wo Contrast  Result Date: 09/02/2022 CLINICAL DATA:  Bilateral lower extremity wounds, discoloration, pain EXAM: CT ANGIOGRAPHY OF ABDOMINAL AORTA WITH ILIOFEMORAL RUNOFF TECHNIQUE: Multidetector CT imaging of the abdomen, pelvis and lower extremities was performed using the standard protocol during bolus administration of intravenous contrast. Multiplanar CT image reconstructions and MIPs were obtained to evaluate the vascular anatomy. RADIATION DOSE REDUCTION: This exam was performed according to the departmental dose-optimization program which includes automated exposure control, adjustment of the mA and/or kV according to patient size and/or use of iterative reconstruction technique. CONTRAST:  146m OMNIPAQUE IOHEXOL 350 MG/ML SOLN COMPARISON:  None Available. FINDINGS: VASCULAR Aorta: Normal caliber aorta without aneurysm, dissection, vasculitis or significant stenosis. Calcified athero sclerotic plaque. Celiac: Patent without evidence of aneurysm, dissection, vasculitis or significant stenosis. SMA: Patent without evidence of aneurysm, dissection, vasculitis or significant stenosis. Renals: Both renal arteries are patent without evidence of aneurysm, dissection, vasculitis, fibromuscular dysplasia or significant stenosis. IMA: Patent without evidence of aneurysm, dissection, vasculitis or significant stenosis. RIGHT Lower  Extremity Inflow: Calcified plaque causes mild-moderate stenosis of the proximal common iliac artery. Moderate-severe narrowing of the right internal iliac artery. No aneurysm or dissection. Outflow: Calcified plaque causes up to moderate narrowing of the common femoral artery and advanced narrowing of the superficial femoral artery which appears occluded distally. Reconstitution of the distal superficial femoral artery and popliteal artery. Runoff: Patent three vessel runoff to the ankle. LEFT Lower Extremity Inflow: Calcified plaque causes mild-moderate stenosis of the proximal common iliac artery. Moderate-severe narrowing of the right internal iliac artery. No aneurysm or dissection. Outflow: Calcified plaque causes up to moderate narrowing of the common femoral artery and advanced narrowing of the superficial femoral artery which appears occluded distally. Reconstitution of the distal superficial femoral artery and popliteal artery. Runoff: Patent three vessel runoff to the ankle. Veins: No obvious venous abnormality within the limitations of this arterial phase study. Review of the MIP images confirms the above findings. NON-VASCULAR Lower chest: Coronary artery calcification. Normal heart size. No pericardial effusion. Hepatobiliary: No focal liver abnormality is seen. Status post cholecystectomy. No biliary dilatation. Pancreas: Unremarkable. No pancreatic ductal dilatation or surrounding inflammatory changes. Spleen: Normal in size without focal abnormality. Adrenals/Urinary Tract: Unchanged nodules in both adrenal glands likely representing adenomas. Kidneys are normal, without renal calculi, focal lesion, or hydronephrosis. Low-attenuation lesions in the kidneys are  statistically likely to represent cysts. No follow-up is required. Bladder is unremarkable. Stomach/Bowel: Stomach is within normal limits. No evidence of bowel wall thickening, distention, or inflammatory changes. Lymphatic: No significant  adenopathy. Reproductive: Unremarkable. Other: No free intraperitoneal fluid or air. Musculoskeletal: No acute osseous abnormality. Foci of soft tissue thickening and adjacent stranding in the anterior abdominal fat about the lower abdomen bilaterally (series 4/image 116). IMPRESSION: VASCULAR Advanced narrowing and likely occlusion of the bilateral superficial femoral arteries with distal reconstitution of the superior femoral/popliteal arteries. Patent three-vessel runoff of both lower extremities. Coronary artery and Aortic Atherosclerosis (ICD10-I70.0). NON-VASCULAR No acute abnormality in the abdomen or pelvis. Foci of soft tissue thickening and adjacent stranding in the lower anterior abdominal wall fat bilaterally. Correlate for panniculitis. Electronically Signed   By: Placido Sou M.D.   On: 09/02/2022 22:09   DG Tibia/Fibula Left  Result Date: 09/02/2022 CLINICAL DATA:  Wound in the distal left lower extremity. EXAM: LEFT TIBIA AND FIBULA - 2 VIEW COMPARISON:  None Available. FINDINGS: No acute fracture or dislocation. Old appearing fracture of the proximal tibia extending from the tibial spine into the proximal tibial diaphysis. The bones are mildly osteopenic. No significant joint effusion. No periosteal elevation or bone erosion to suggest acute osteomyelitis. Mild soft tissue thickening over the anterior ankle. Faint focus of low attenuation in the skin anterior to the distal tibia likely corresponds to the wound. No radiopaque foreign object. IMPRESSION: 1. No acute fracture or dislocation. 2. No radiographic evidence of acute osteomyelitis. 3. Old appearing fracture of the proximal tibia. Electronically Signed   By: Anner Crete M.D.   On: 09/02/2022 18:06        Scheduled Meds:  aspirin EC  81 mg Oral Daily   atorvastatin  10 mg Oral Daily   clopidogrel  75 mg Oral Daily   FLUoxetine  40 mg Oral Daily   lisinopril  20 mg Oral Daily   And   hydrochlorothiazide  25 mg Oral  Daily   insulin aspart  0-15 Units Subcutaneous TID WC   pantoprazole  40 mg Oral Daily   senna  1 tablet Oral BID   Continuous Infusions:  sodium chloride 50 mL/hr at 09/04/22 0605   heparin 1,100 Units/hr (09/03/22 1142)     LOS: 1 day    Time spent: 37 mins     Wyvonnia Dusky, MD [facility] Pager [phone]  If 7PM-7AM, please contact night-coverage [email or web address] 09/04/2022, 8:19 AM

## 2022-09-04 NOTE — Progress Notes (Signed)
Dr. Jimmye Norman made aware of pt BG=412.  Per MD, give 20 units insulin aspartame SQ, no stat labs necessary.

## 2022-09-04 NOTE — Plan of Care (Signed)

## 2022-09-04 NOTE — Progress Notes (Signed)
Patient received from 2A via bed.  Alert and oriented x 4.  [place] '@2L'$ .  Right femoral dressing CDI.  Patient verbalized good sensation to BLE.  Left pedal pulse palpable, strong.  Right pedal pulse unable to appreciate.  Oriented to room and unit routine.  Needs addressed.

## 2022-09-05 DIAGNOSIS — E1165 Type 2 diabetes mellitus with hyperglycemia: Secondary | ICD-10-CM | POA: Diagnosis not present

## 2022-09-05 DIAGNOSIS — Z794 Long term (current) use of insulin: Secondary | ICD-10-CM | POA: Diagnosis not present

## 2022-09-05 DIAGNOSIS — I739 Peripheral vascular disease, unspecified: Secondary | ICD-10-CM | POA: Diagnosis not present

## 2022-09-05 LAB — CBC
HCT: 41.2 % (ref 36.0–46.0)
Hemoglobin: 13.9 g/dL (ref 12.0–15.0)
MCH: 31.5 pg (ref 26.0–34.0)
MCHC: 33.7 g/dL (ref 30.0–36.0)
MCV: 93.4 fL (ref 80.0–100.0)
Platelets: 419 10*3/uL — ABNORMAL HIGH (ref 150–400)
RBC: 4.41 MIL/uL (ref 3.87–5.11)
RDW: 13.2 % (ref 11.5–15.5)
WBC: 11.2 10*3/uL — ABNORMAL HIGH (ref 4.0–10.5)
nRBC: 0 % (ref 0.0–0.2)

## 2022-09-05 LAB — GLUCOSE, CAPILLARY
Glucose-Capillary: 399 mg/dL — ABNORMAL HIGH (ref 70–99)
Glucose-Capillary: 306 mg/dL — ABNORMAL HIGH (ref 70–99)
Glucose-Capillary: 279 mg/dL — ABNORMAL HIGH (ref 70–99)
Glucose-Capillary: 369 mg/dL — ABNORMAL HIGH (ref 70–99)

## 2022-09-05 LAB — BASIC METABOLIC PANEL
Anion gap: 12 (ref 5–15)
BUN: 13 mg/dL (ref 8–23)
CO2: 27 mmol/L (ref 22–32)
Calcium: 9 mg/dL (ref 8.9–10.3)
Chloride: 94 mmol/L — ABNORMAL LOW (ref 98–111)
Creatinine, Ser: 0.77 mg/dL (ref 0.44–1.00)
GFR, Estimated: >60 mL/min (ref >60)
Glucose, Bld: 332 mg/dL — ABNORMAL HIGH (ref 70–99)
Potassium: 4.3 mmol/L (ref 3.5–5.1)
Sodium: 133 mmol/L — ABNORMAL LOW (ref 135–145)

## 2022-09-05 MED ORDER — GUAIFENESIN-DM 100-10 MG/5ML PO SYRP
5.0000 mL | ORAL_SOLUTION | ORAL | Status: DC | PRN
  Administered 2022-09-05 – 2022-09-10 (×2): 5 mL via ORAL
  Filled 2022-09-05 (×2): qty 10

## 2022-09-05 MED ORDER — MORPHINE SULFATE (PF) 2 MG/ML IV SOLN
2.0000 mg | INTRAVENOUS | Status: DC | PRN
  Administered 2022-09-05 – 2022-09-10 (×19): 2 mg via INTRAVENOUS
  Filled 2022-09-05 (×19): qty 1

## 2022-09-05 MED ORDER — INSULIN ASPART 100 UNIT/ML IJ SOLN
0.0000 [IU] | Freq: Every day | INTRAMUSCULAR | Status: DC
  Administered 2022-09-05: 5 [IU] via SUBCUTANEOUS
  Administered 2022-09-08: 3 [IU] via SUBCUTANEOUS
  Filled 2022-09-05 (×2): qty 1

## 2022-09-05 MED ORDER — INSULIN ASPART 100 UNIT/ML IJ SOLN: 0.0000 [IU] | Freq: Three times a day (TID) | INTRAMUSCULAR | Status: DC

## 2022-09-05 MED ORDER — INSULIN GLARGINE-YFGN 100 UNIT/ML ~~LOC~~ SOLN
25.0000 [IU] | Freq: Every day | SUBCUTANEOUS | Status: DC
  Administered 2022-09-05: 25 [IU] via SUBCUTANEOUS
  Filled 2022-09-05 (×2): qty 0.25

## 2022-09-05 NOTE — TOC Initial Note (Signed)
Transition of Care [facility]) - Initial/Assessment Note    Patient Details  Name: Kristie Olson MRN: [id number] Date of Birth: November 16, 1955  [facility] CM/SW Contact:    Rebekah [place], LCSW Phone Number: 09/05/2022, 1:12 PM  Clinical Narrative:  CSW met with patient and discussed PT recommendation for SNF. Pt is not interested in SNF placement; however, is open to [facility]. CSW provided CMS scores for pt to review. She agreed to notify staff with [facility] preference. Pt called adult son, Kristie Olson, on personal cell with CSW present. Pt's son reports he will assist with pt's needs in the home, emotional support provided from spouse            PCP is Dr. Lennox Grumbles with [facility]. Pt utilizes spouse and Medicaid Transportation to medical appts. States spouse will provide transportation at discharge     Expected Discharge Plan: [place] Barriers to Discharge: Continued Medical Work up   Patient Goals and CMS Choice   CMS Medicare.gov Compare Post Acute Care list provided to:: Patient    Expected Discharge Plan and Services Expected Discharge Plan: [place] Choice: [place] arrangements for the past 2 months: Single Family Home                                      Prior Living Arrangements/Services Living arrangements for the past 2 months: Single Family Home Lives with:: Adult Children, Spouse Patient language and need for interpreter reviewed:: Yes Do you feel safe going back to the place where you live?: Yes      Need for Family Participation in Patient Care: Yes (Comment)     Criminal Activity/Legal Involvement Pertinent to Current Situation/Hospitalization: No - Comment as needed  Activities of Daily Living Home Assistive Devices/Equipment: Eyeglasses ADL Screening (condition at time of admission) Patient's cognitive ability adequate to safely complete daily activities?: Yes Is the patient deaf or  have difficulty hearing?: No Does the patient have difficulty seeing, even when wearing glasses/contacts?: No Does the patient have difficulty concentrating, remembering, or making decisions?: No Patient able to express need for assistance with ADLs?: Yes Does the patient have difficulty dressing or bathing?: No Independently performs ADLs?: No Communication: Independent Dressing (OT): Independent Grooming: Independent Feeding: Independent Bathing: Independent Toileting: Independent In/Out Bed: Needs assistance Is this a change from baseline?: Change from baseline, expected to last <3 days Walks in Home: Needs assistance Is this a change from baseline?: Change from baseline, expected to last <3 days Does the patient have difficulty walking or climbing stairs?: Yes Weakness of Legs: Both Weakness of Arms/Hands: None  Permission Sought/Granted Permission sought to share information with : Family Supports                Emotional Assessment Appearance:: Appears stated age Attitude/Demeanor/Rapport: Engaged Affect (typically observed): Appropriate, Calm, Pleasant Orientation: : Oriented to Self, Oriented to Place, Oriented to  Time, Oriented to Situation Alcohol / Substance Use: Not Applicable Psych Involvement: No (comment)  Admission diagnosis:  Claudication of both lower extremities ([place]) [I73.9] Critical limb ischemia of both lower extremities ([place]) [I70.223] Patient Active Problem List   Diagnosis Date Noted   PAD (peripheral artery disease) ([place]) 09/03/2022   Claudication of both lower extremities ([place]) 09/03/2022   Critical limb ischemia of both lower extremities ([place])  Cervical spondylosis without myelopathy 01/16/2021   Disorder of bursae of shoulder region 01/16/2021   Right bundle branch block 09/18/2020   Skin macule or macular rash 09/18/2020   Vulvar itching 09/18/2020   Asthma 09/16/2020   Peripheral neuropathy 09/16/2020   Pancreatitis 09/16/2020    Anxiety and depression 09/16/2020   Type 2 diabetes mellitus ([place]) 09/16/2020   Benign essential hypertension 07/31/2020   Hematuria 07/31/2020   Proteinuria 07/31/2020   Type II or unspecified type diabetes mellitus with renal manifestations, not stated as uncontrolled 07/31/2020   Acute pancreatitis 04/28/2020   Diabetes mellitus with hyperglycemia ([place])    Dehydration    Depression    Bronchitis 11/26/2018   Cervical radiculopathy 08/14/2018   Type 2 diabetes mellitus with hyperglycemia ([place]) 06/20/2018   Breast abscess of female 03/29/2018   Transient ischemic attack 08/05/2017   Slurred speech 07/27/2017   Dizziness 02/22/2017   Long term current use of insulin ([place]) 11/01/2013   Type 2 diabetes mellitus with diabetic neuropathy ([place]) 04/18/2013   Hyperlipidemia 07/19/2008   Hypertension 10/25/2007   PCP:  [facility], [place]:   [facility] -  Delivery - [place] [place] [address] Phone: [phone] Fax: [place], [address] [place] [address] Phone: [phone] Fax: [phone]  [place], [place] [address] [place] [address] Phone: [phone] Fax: [phone]  [place] [address]), [place] - [place]) [place] [address] Phone: [phone] Fax: [phone]     Social Determinants of Health ([place]) Interventions    Readmission Risk Interventions     No data to display

## 2022-09-05 NOTE — Evaluation (Signed)
Physical Therapy Evaluation Patient Details Name: KHERINGTON MERAZ MRN: [id number] DOB: Jan 28, 1956 Today's Date: 09/05/2022  History of Present Illness  Pt admitted to [facility] on 09/02/22 for c/o worsening BLE leg wounds, pain, and leg wound oozing.  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 S/p angioplasty of left SFA & proximal popliteal artery and stent placement to left SFA on 09/03/22. Significant PMH includes: chronic smoker, T2DM, HTN, obesity, HLD.   Clinical Impression  Pt is a 66 year old F admitted to hospital on 09/02/22 for claudication of BLE. At baseline, pt reports needing set up assist for ADL's and sponge baths; notes increased difficulty with pericare/hygiene after toileting. She uses rollator/QC for limited ambulation but spends most of her time in bed at home. Family to assist with IADL's and transportation.   Pt presents with functional weakness, decreased standing balance, increased pain, impaired skin integrity, poor insight into deficits, decreased activity tolerance, altered sensation of BLE, and poor safety awareness, resulting in impaired functional mobility from baseline. Due to deficits, pt required mod-max assist for bed mobility, mod assist for transfers, and mod assist to take multiple small steps at bedside with QC and HHA from PT. Further mobility limited secondary to decreased balance, functional weakness, and increased LE pain with WB. Deficits limit the pt's ability to safely and independently perform ADL's, transfer, and ambulate.   Pt will benefit from acute skilled PT services to address deficits for return to baseline function. At this time, PT recommends SNF rehab at [place] to address deficits and improve overall safety with functional mobility prior to return home. Pt upset about this recommendation, stating she wants to DC home. If pt declines PT original recommendation, will recommend  DC home with HHPT, Aide, BSC, and WC as she has limited physical assist from family members.        Recommendations for follow up therapy are one component of a multi-disciplinary discharge planning process, led by the attending physician.  Recommendations may be updated based on patient status, additional functional criteria and insurance authorization.  Follow Up Recommendations Skilled nursing-short term rehab (<3 hours/day) Can patient physically be transported by private vehicle: No    Assistance Recommended at Discharge Frequent or constant Supervision/Assistance  Patient can return home with the following  A lot of help with walking and/or transfers;A little help with bathing/dressing/bathroom;Direct supervision/assist for medications management;Direct supervision/assist for financial management;Assistance with cooking/housework;Assist for transportation;Help with stairs or ramp for entrance    Equipment Recommendations BSC/3in1  Recommendations for Other Services  OT consult    Functional Status Assessment Patient has had a recent decline in their functional status and demonstrates the ability to make significant improvements in function in a reasonable and predictable amount of time.     Precautions / Restrictions Precautions Precautions: Fall Restrictions Weight Bearing Restrictions: No Other Position/Activity Restrictions: O2 >/= 92%      Mobility  Bed Mobility Overal bed mobility: Needs Assistance Bed Mobility: Supine to Sit, Sit to Supine     Supine to sit: Mod assist Sit to supine: Max assist   General bed mobility comments: for trunk and hip facilitation; increased time/effort; limited secondary to generalized weakness and pain    Transfers Overall transfer level: Needs assistance Equipment used: 1 person hand held assist, Quad cane Transfers: Sit to/from Stand Sit to Stand: Mod assist           General transfer comment: for power to stand from  EOB  with QC in R hand and HHA from PT on L. Increased difficulty with full upright standing secondary to LE pain with WB    Ambulation/Gait Ambulation/Gait assistance: Mod assist Gait Distance (Feet): 2 Feet Assistive device: Quad cane, 1 person hand held assist         General Gait Details: mod assist for balance and weight shift to take a few lateral steps at bedside for repositioning. Increased difficulty with bil weight shift, step length/foot clearance, and balance secondary to weakness and c/o LE pain.      Balance Overall balance assessment: Needs assistance   Sitting balance-Leahy Scale: Good Sitting balance - Comments: seated EOB; able to don socks bil, increased difficulty donning R sock due to recent procedure   Standing balance support: Bilateral upper extremity supported, During functional activity Standing balance-Leahy Scale: Poor Standing balance comment: mod assist to maintain standing balance with increased UE support; increased LE pain with WB                             Pertinent Vitals/Pain Pain Assessment Pain Assessment: Faces Faces Pain Scale: Hurts whole lot Pain Location: BLE with WB Pain Intervention(s): Limited activity within patient's tolerance, Monitored during session, Repositioned, Premedicated before session    Home Living Family/patient expects to be discharged to:: Private residence Living Arrangements: Spouse/significant other Available Help at Discharge: Family;Available PRN/intermittently (pt reports that spouse has Cancer and is unable to provide much physical assist; son and granddaughter are on disability and can't/don't provide much physical assist) Type of Home: House Home Access: Stairs to enter Entrance Stairs-Rails: Left;Can reach Advertising account executive of Steps: 6- could go around to front   Home Layout: One level Home Equipment: BSC/3in1;Rollator (4 wheels);Cane - quad Additional Comments: reports BSC is  rusted    Prior Function Prior Level of Function : Needs assist             Mobility Comments: limited household distances with rollator/quad cane; pt reports she "spends 99% of the time in bed watching TV" ADLs Comments: sponge bathes at baseline; requires set up assist for all ADL's; assist for all IADLs; reports difficulty performing pericare/hygiene after toileting     Hand Dominance   Dominant Hand: Right    Extremity/Trunk Assessment   Upper Extremity Assessment Upper Extremity Assessment: Defer to OT evaluation    Lower Extremity Assessment Lower Extremity Assessment: Generalized weakness (impaired sensation/TTP along BLE; multiple healing/scabbed wounds to BLE (L>R))       Communication   Communication: No difficulties  Cognition Arousal/Alertness: Awake/alert Behavior During Therapy: WFL for tasks assessed/performed Overall Cognitive Status: Within Functional Limits for tasks assessed                                 General Comments: A&O x4; follows 2-step commands, increased cueing for attention to task; poor insight into deficits        General Comments General comments (skin integrity, edema, etc.): multiple healing/scabbed wounds to BLE (R>L)    Exercises Other Exercises Other Exercises: Participates in bed mobility, prolonged seated balance, donning socks at EOB, transfers, and minimal gait. Increased assist required for all functional mobility due to pain and weakness. Other Exercises: Pt educated re: PT role/POC, pain management, skin care, assist at home.   Assessment/Plan    PT Assessment Patient needs continued PT services  PT Problem  List Decreased strength;Decreased activity tolerance;Decreased balance;Decreased mobility;Decreased safety awareness;Impaired sensation;Obesity;Decreased skin integrity;Pain       PT Treatment Interventions DME instruction;Gait training;Stair training;Functional mobility training;Therapeutic  activities;Therapeutic exercise;Balance training;Neuromuscular re-education    PT Goals (Current goals can be found in the Care Plan section)  Acute Rehab PT Goals Patient Stated Goal: go home PT Goal Formulation: With patient Time For Goal Achievement: 09/19/22 Potential to Achieve Goals: Fair    Frequency Min 2X/week        AM-PAC PT "6 Clicks" Mobility  Outcome Measure Help needed turning from your back to your side while in a flat bed without using bedrails?: A Lot Help needed moving from lying on your back to sitting on the side of a flat bed without using bedrails?: A Lot Help needed moving to and from a bed to a chair (including a wheelchair)?: A Lot Help needed standing up from a chair using your arms (e.g., wheelchair or bedside chair)?: A Lot Help needed to walk in hospital room?: A Lot Help needed climbing 3-5 steps with a railing? : Total 6 Click Score: 11    End of Session Equipment Utilized During Treatment: Gait belt Activity Tolerance: Patient tolerated treatment well;Patient limited by pain Patient left: in bed;with call bell/phone within reach;with bed alarm set Nurse Communication: Mobility status PT Visit Diagnosis: Unsteadiness on feet (R26.81);Muscle weakness (generalized) (M62.81);Difficulty in walking, not elsewhere classified (R26.2);Pain Pain - Right/Left:  (bil) Pain - part of body: Leg    Time: 1017-5102 PT Time Calculation (min) (ACUTE ONLY): 38 min   Charges:   PT Evaluation $PT Eval Low Complexity: 1 Low PT Treatments $Therapeutic Activity: 8-22 mins       Herminio Commons, PT, DPT 3:44 PM,09/05/22 Physical Therapist - [facility]

## 2022-09-05 NOTE — Progress Notes (Signed)
PROGRESS NOTE    Kristie Olson  MHD:[id number] DOB: 08-11-1956 DOA: 09/02/2022 PCP: [facility]    Assessment & Plan:   Principal Problem:   Claudication of both lower extremities ([place]) Active Problems:   Hypertension   Diabetes mellitus with hyperglycemia ([place])   PAD (peripheral artery disease) ([place])  Assessment and Plan: PAD: presented w/ leg leg pain, claudication & chronic ulcerations. 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 S/p angioplasty of left SFA & proximal popliteal artery and stent placement to left SFA on 09/03/22 as per vasc surg. Will need to have RLE angioplasty as well. Continue on plavix, statin   DM2: poorly controlled, HbA1c 8.2. Increased glargine, increased SSI. Pt refuses carb modified diet. Continue regular diet as per pt's request   HTN: continue on home dose of lisinopril, HCTZ  Constipation: continue on miralax, colace   Obesity: BMI 39.0. Complicates overall care & prognosis   Thrombocytosis: labile. Etiology unclear   DVT prophylaxis: lovenox  Code Status: full  Family Communication:  Disposition Plan: likely d/c home w/ HH as pt refuses SNF  Level of care: Med-Surg  Status is: Inpatient Remains inpatient appropriate because: severity of illness   Consultants:  Vasc surg   Procedures:   Antimicrobials:    Subjective: Pt c/o leg pain   Objective: Vitals:   09/05/22 0426 09/05/22 0507 09/05/22 0510 09/05/22 0602  BP: (!) 142/62 137/64  131/66  Pulse: (!) 119 (!) 116 (!) 113 (!) 112  Resp: '16 16  18  '$ Temp: 98.4 F (36.9 C) 98.5 F (36.9 C)  98.2 F (36.8 C)  TempSrc:  Oral  Oral  SpO2: 97% 96%  98%  Weight:      Height:        Intake/Output Summary (Last 24 hours) at 09/05/2022 0815 Last data filed at 09/05/2022 0600 Gross per 24 hour  Intake 280 ml  Output 1600 ml  Net -1320 ml   Filed Weights   09/02/22 1724   Weight: 90.7 kg    Examination:  General exam: Appears frustrated   Respiratory system: clear breath sounds b/l  Cardiovascular system: S1 & S2+. No rubs or clicks  Gastrointestinal system: Abd is soft, NT, obese & hypoactive bowel sounds  Central nervous system: alert and oriented. Moves all extremities  Psychiatry: Judgement and insight appears at baseline. Flat mood and affect     Data Reviewed: I have personally reviewed following labs and imaging studies  CBC: Recent Labs  Lab 09/02/22 1738 09/03/22 0927 09/04/22 0546 09/05/22 0424  WBC 9.0 6.3 9.3 11.2*  HGB 13.7 13.3 13.4 13.9  HCT 41.7 40.2 40.0 41.2  MCV 95.4 94.8 94.1 93.4  PLT 493* 440* 434* 211*   Basic Metabolic Panel: Recent Labs  Lab 09/02/22 1738 09/03/22 0627 09/03/22 0927 09/04/22 0546 09/05/22 0424  NA 138  --  134* 133* 133*  K 3.9  --  4.2 4.2 4.3  CL 100  --  98 97* 94*  CO2 29  --  '25 27 27  '$ GLUCOSE 80  --  325* 327* 332*  BUN 12  --  '11 12 13  '$ CREATININE 0.75 0.80 0.70 0.90 0.77  CALCIUM 9.6  --  9.2 8.9 9.0   GFR: Estimated Creatinine Clearance: 67.6 mL/min (by C-G formula based on SCr of 0.77 mg/dL). Liver Function Tests: No results for input(s): "AST", "ALT", "ALKPHOS", "BILITOT", "PROT", "ALBUMIN" in the last 168 hours.  No results for input(s): "LIPASE", "AMYLASE" in the last 168 hours. No results for input(s): "AMMONIA" in the last 168 hours. Coagulation Profile: Recent Labs  Lab 09/02/22 2322  INR 1.0   Cardiac Enzymes: No results for input(s): "CKTOTAL", "CKMB", "CKMBINDEX", "TROPONINI" in the last 168 hours. BNP (last 3 results) No results for input(s): "PROBNP" in the last 8760 hours. HbA1C: Recent Labs    09/02/22 1738  HGBA1C 8.2*   CBG: Recent Labs  Lab 09/03/22 2031 09/04/22 0832 09/04/22 1206 09/04/22 1639 09/04/22 2125  GLUCAP 287* 347* 312* 412* 253*   Lipid Profile: No results for input(s): "CHOL", "HDL", "LDLCALC", "TRIG", "CHOLHDL", "LDLDIRECT"  in the last 72 hours. Thyroid Function Tests: No results for input(s): "TSH", "T4TOTAL", "FREET4", "T3FREE", "THYROIDAB" in the last 72 hours. Anemia Panel: No results for input(s): "VITAMINB12", "FOLATE", "FERRITIN", "TIBC", "IRON", "RETICCTPCT" in the last 72 hours. Sepsis Labs: No results for input(s): "PROCALCITON", "LATICACIDVEN" in the last 168 hours.  No results found for this or any previous visit (from the past 240 hour(s)).       Radiology Studies: PERIPHERAL VASCULAR CATHETERIZATION  Result Date: 09/03/2022 See surgical note for result.       Scheduled Meds:  aspirin EC  81 mg Oral Daily   atorvastatin  10 mg Oral Daily   clopidogrel  75 mg Oral Daily   docusate sodium  200 mg Oral BID   FLUoxetine  40 mg Oral Daily   lisinopril  20 mg Oral Daily   And   hydrochlorothiazide  25 mg Oral Daily   insulin aspart  0-20 Units Subcutaneous TID WC   insulin aspart  0-5 Units Subcutaneous QHS   insulin glargine-yfgn  25 Units Subcutaneous Daily   pantoprazole  40 mg Oral Daily   polyethylene glycol  17 g Oral Daily   senna  1 tablet Oral BID   Continuous Infusions:  sodium chloride 50 mL/hr at 09/05/22 0635     LOS: 2 days    Time spent: 30 mins     Wyvonnia Dusky, MD [facility] Pager [phone]  If 7PM-7AM, please contact night-coverage [email or web address] 09/05/2022, 8:15 AM

## 2022-09-05 NOTE — Progress Notes (Signed)
PT Cancellation Note  Patient Details Name: Kristie Olson MRN: [id number] DOB: January 18, 1956   Cancelled Treatment:    Reason Eval/Treat Not Completed: Pain limiting ability to participate. PT orders received and pt chart reviewed. Per RN report, pt had just gotten pain medication for severe pain. Requesting PT come back after medication has had enough time to kick in. Will follow up with PT evaluation later today, as appropriate.  Herminio Commons, PT, DPT 9:04 AM,09/05/22 Physical Therapist - [facility]

## 2022-09-05 NOTE — Progress Notes (Signed)
Pt complaint of severe pain upon assessing vitals. Pt medicated with 1 x dose of dilaudid. Heart remained elevated. NP on call notified. No new orders. Pt reports feeling the best she has all day. Lowest heart rate seen 113.   09/05/22 0343 09/05/22 0400 09/05/22 0426  Assess: MEWS Score  Temp 98.2 F (36.8 C)  --  98.4 F (36.9 C)  BP (!) 142/62  --  (!) 142/62  MAP (mmHg) 85  --  87  Pulse Rate (!) 116  --  (!) 119  Resp 18  --  16  SpO2 99 %  --  97 %  O2 Device Nasal Cannula  --  Nasal Cannula  O2 Flow Rate (L/min) 2 L/min  --  2 L/min  Assess: MEWS Score  MEWS Temp 0  --  0  MEWS Systolic 0  --  0  MEWS Pulse 2  --  2  MEWS RR 0  --  0  MEWS LOC 0  --  0  MEWS Score 2  --  2  MEWS Score Color Yellow  --  Yellow  Assess: if the MEWS score is Yellow or Red  Were vital signs taken at a resting state? Yes  --   --   Focused Assessment No change from prior assessment  --   --   Does the patient meet 2 or more of the SIRS criteria? No  --   --   MEWS guidelines implemented *See Row Information* Yes  --   --   Treat  MEWS Interventions  --  Administered prn meds/treatments  --   Pain Scale  --  Faces  --   Pain Score  --  7  --   Pain Type  --  Chronic pain  --   Pain Location  --  Leg  --   Pain Orientation  --  Left  --   Pain Intervention(s)  --  Medication (See eMAR)  --   Escalate  MEWS: Escalate Yellow: discuss with charge nurse/RN and consider discussing with provider and RRT  --   --   Notify: Charge Nurse/RN  Name of Charge Nurse/RN Notified Animator  --   --   Date Charge Nurse/RN Notified 09/05/22  --   --   Time Charge Nurse/RN Notified 0400  --   --   Notify: Provider  Provider Name/Title B. Randol Kern NP  --   --   Date Provider Notified 09/05/22  --   --   Time Provider Notified [phone]  --   --   Method of Notification  (amion)  --   --   Notification Reason Other (Comment) (Yellow mews)  --   --   Provider response No new orders  --   --   Date of Provider  Response 09/05/22  --   --   Time of Provider Response 0510  --   --   Document  Patient Outcome Other (Comment) (Pt reports pain level is 4. Feels better than she has all day. But heart rate remains elevated.)  --   --   Progress note created (see row info) Yes  --   --   Assess: SIRS CRITERIA  SIRS Temperature  0  --  0  SIRS Pulse 1  --  1  SIRS Respirations  0  --  0  SIRS WBC 1  --  1  SIRS Score Sum  2  --  2  09/05/22 0432 09/05/22 0507  Assess: MEWS Score  Temp  --  98.5 F (36.9 C)  BP  --  137/64  MAP (mmHg)  --  85  Pulse Rate  --  (!) 116  Resp  --  16  SpO2  --  96 %  O2 Device  --  Nasal Cannula  O2 Flow Rate (L/min)  --  2 L/min  Assess: MEWS Score  MEWS Temp  --  0  MEWS Systolic  --  0  MEWS Pulse  --  2  MEWS RR  --  0  MEWS LOC  --  0  MEWS Score  --  2  MEWS Score Color  --  Yellow  Assess: if the MEWS score is Yellow or Red  Were vital signs taken at a resting state?  --   --   Focused Assessment  --   --   Does the patient meet 2 or more of the SIRS criteria?  --   --   MEWS guidelines implemented *See Row Information*  --   --   Treat  MEWS Interventions  --   --   Pain Scale  --   --   Pain Score 4  --   Pain Type  --   --   Pain Location  --   --   Pain Orientation  --   --   Pain Intervention(s)  --   --   Escalate  MEWS: Escalate  --   --   Notify: Charge Nurse/RN  Name of Charge Nurse/RN Notified  --   --   Date Charge Nurse/RN Notified  --   --   Time Charge Nurse/RN Notified  --   --   Notify: Provider  Provider Name/Title  --   --   Date Provider Notified  --   --   Time Provider Notified  --   --   Method of Notification  --   --   Notification Reason  --   --   Provider response  --   --   Date of Provider Response  --   --   Time of Provider Response  --   --   Document  Patient Outcome  --   --   Progress note created (see row info)  --   --   Assess: SIRS CRITERIA  SIRS Temperature   --  0  SIRS Pulse  --  1  SIRS  Respirations   --  0  SIRS WBC  --  1  SIRS Score Sum   --  2

## 2022-09-05 NOTE — Plan of Care (Signed)
Pt alert and oriented x 4. Has not been up but adjust position in bed. Pt heart rate at beginning of shift was 106. VS checked and at 0343 HR 116. Charge RN aware. Pt complaint of severe pain. Dilaudid 1 x dose given. Effective at decreasing pain to #4. Pt Heart rate remains elevated. No change in assessment. Msg B. Randol Kern NP and made aware. Morphine given x 2 this shift and 1 dose of dilaudid.  Problem: Clinical Measurements: Goal: Cardiovascular complication will be avoided Outcome: Not Progressing   Problem: Education: Goal: Knowledge of General Education information will improve Description: Including pain rating scale, medication(s)/side effects and non-pharmacologic comfort measures Outcome: Progressing   Problem: Health Behavior/Discharge Planning: Goal: Ability to manage health-related needs will improve Outcome: Progressing   Problem: Clinical Measurements: Goal: Ability to maintain clinical measurements within normal limits will improve Outcome: Progressing Goal: Will remain free from infection Outcome: Progressing Goal: Diagnostic test results will improve Outcome: Progressing Goal: Respiratory complications will improve Outcome: Progressing   Problem: Activity: Goal: Risk for activity intolerance will decrease Outcome: Progressing   Problem: Nutrition: Goal: Adequate nutrition will be maintained Outcome: Progressing   Problem: Coping: Goal: Level of anxiety will decrease Outcome: Progressing   Problem: Elimination: Goal: Will not experience complications related to bowel motility Outcome: Progressing Goal: Will not experience complications related to urinary retention Outcome: Progressing   Problem: Pain Managment: Goal: General experience of comfort will improve Outcome: Progressing   Problem: Safety: Goal: Ability to remain free from injury will improve Outcome: Progressing   Problem: Skin Integrity: Goal: Risk for impaired skin integrity will  decrease Outcome: Progressing

## 2022-09-05 NOTE — Evaluation (Signed)
Occupational Therapy Evaluation Patient Details Name: OLIVEAH ZWACK MRN: [id number] DOB: 03/15/1956 Today's Date: 09/05/2022   History of Present Illness Pt is a 66 year old female admitted with L leg pain, claudication & chronic ulcerations. 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 S/p angioplasty of left SFA & proximal popliteal artery and stent placement to left SFA on 09/03/22 as per vasc surg   Clinical Impression   Chart reviewed, Rn cleared pt for participation in OT evaluation. Pt is alert and oriented x4, fair-poor safety awareness of current deficits, level of functioning. PTA pt reports typically amb in house with quad cane or RW, limited household distances, sink bathes at baseline. Pt reports she will not discharge to STR, wants to return home. Pt requires MIN-MOD A for bathing, dressing tasks, limited by generalized weakness, pain. Pt presents with deficits in strength, endurance, activity tolerance, balance, pain all affecting safe and optimal ADL completion.At this time, recommend STR to address deficits. If pt chooses to discharge home, recommend HHOT, frequent/constant supervision, consider mwc for mobility. OT will continue to follow acutely.      Recommendations for follow up therapy are one component of a multi-disciplinary discharge planning process, led by the attending physician.  Recommendations may be updated based on patient status, additional functional criteria and insurance authorization.   Follow Up Recommendations  Skilled nursing-short term rehab (<3 hours/day)    Assistance Recommended at Discharge Frequent or constant Supervision/Assistance  Patient can return home with the following A lot of help with walking and/or transfers;A lot of help with bathing/dressing/bathroom    Functional Status Assessment  Patient has had a recent decline in their functional status and  demonstrates the ability to make significant improvements in function in a reasonable and predictable amount of time.  Equipment Recommendations  Wheelchair (measurements OT)    Recommendations for Other Services       Precautions / Restrictions Precautions Precautions: Fall Restrictions Weight Bearing Restrictions: No Other Position/Activity Restrictions: O2 >/= 92%      Mobility Bed Mobility Overal bed mobility: Needs Assistance Bed Mobility: Supine to Sit, Sit to Supine     Supine to sit: Mod assist Sit to supine: Mod assist   General bed mobility comments: MOD A for boost up the bed    Transfers Overall transfer level: Needs assistance Equipment used: Rolling walker (2 wheels) Transfers: Sit to/from Stand Sit to Stand: Mod assist                  Balance Overall balance assessment: Needs assistance Sitting-balance support: Feet supported Sitting balance-Leahy Scale: Good Sitting balance - Comments: dyamic ADL tasks   Standing balance support: Bilateral upper extremity supported, During functional activity Standing balance-Leahy Scale: Poor                             ADL either performed or assessed with clinical judgement   ADL Overall ADL's : Needs assistance/impaired Eating/Feeding: Set up;Sitting   Grooming: Wash/dry hands;Wash/dry face;Sitting;Set up   Upper Body Bathing: Minimal assistance;Sitting   Lower Body Bathing: Moderate assistance;Sit to/from stand;Sitting/lateral leans   Upper Body Dressing : Minimal assistance;Sitting   Lower Body Dressing: Moderate assistance;Sit to/from stand;Sitting/lateral leans   Toilet Transfer: Moderate assistance Toilet Transfer Details (indicate cue type and reason): simulated Toileting- Clothing Manipulation and Hygiene: Moderate assistance;Sit to/from stand;Sitting/lateral lean  Vision Patient Visual Report: No change from baseline       Perception     Praxis       Pertinent Vitals/Pain Pain Assessment Pain Assessment: 0-10 Pain Score: 10-Worst pain ever Pain Location: BLE Pain Descriptors / Indicators: Discomfort Pain Intervention(s): Limited activity within patient's tolerance, Monitored during session, Repositioned     Hand Dominance Right   Extremity/Trunk Assessment Upper Extremity Assessment Upper Extremity Assessment: Overall WFL for tasks assessed   Lower Extremity Assessment Lower Extremity Assessment: Generalized weakness       Communication Communication Communication: No difficulties   Cognition Arousal/Alertness: Awake/alert Behavior During Therapy: WFL for tasks assessed/performed Overall Cognitive Status: No family/caregiver present to determine baseline cognitive functioning Area of Impairment: Safety/judgement, Awareness, Problem solving                         Safety/Judgement: Decreased awareness of safety, Decreased awareness of deficits Awareness: Emergent Problem Solving: Requires verbal cues, Requires tactile cues       General Comments  wounds on BLE, vitals appear stable througout    Exercises     Shoulder Instructions      Home Living Family/patient expects to be discharged to:: Private residence Living Arrangements: Spouse/significant other Available Help at Discharge: Family;Available PRN/intermittently Type of Home: House Home Access: Stairs to enter [employer] of Steps: 6- could go around to front Entrance Stairs-Rails: Left;Can reach both;Right Home Layout: One level     Bathroom Shower/Tub: Tub/shower unit;Sponge bathes at baseline   [employer]: Standard     Home Equipment: BSC/3in1;Rollator (4 wheels);Cane - quad   Additional Comments: reports BSC is rusted      Prior Functioning/Environment Prior Level of Function : Needs assist             Mobility Comments: limited household distances with rollator/quad cane; pt reports she "spends 99% of the  time in bed watching TV" ADLs Comments: sponge bathes at baseline; requires set up assist for all ADL's; assist for all IADLs; reports difficulty performing pericare/hygiene after toileting        OT Problem List: Decreased strength;Decreased activity tolerance;Impaired balance (sitting and/or standing);Decreased knowledge of use of DME or AE      OT Treatment/Interventions: Self-care/ADL training;Therapeutic exercise;Patient/family education;DME and/or AE instruction;Energy conservation;Therapeutic activities    OT Goals(Current goals can be found in the care plan section) Acute Rehab OT Goals Patient Stated Goal: go home OT Goal Formulation: With patient Time For Goal Achievement: 09/19/22 Potential to Achieve Goals: Fair ADL Goals Pt Will Perform Grooming: with modified independence;sitting;standing Pt Will Perform Upper Body Bathing: with modified independence;sitting Pt Will Perform Lower Body Bathing: with min guard assist;sit to/from stand Pt Will Transfer to Toilet: with modified independence Pt Will Perform Toileting - Clothing Manipulation and hygiene: with modified independence  OT Frequency: Min 2X/week    Co-evaluation              AM-PAC OT "6 Clicks" Daily Activity     Outcome Measure Help from another person eating meals?: None Help from another person taking care of personal grooming?: None Help from another person toileting, which includes using toliet, bedpan, or urinal?: A Lot Help from another person bathing (including washing, rinsing, drying)?: A Lot Help from another person to put on and taking off regular upper body clothing?: A Little Help from another person to put on and taking off regular lower body clothing?: A Lot 6 Click Score: 17  End of Session Equipment Utilized During Treatment: Rolling walker (2 wheels) Nurse Communication: Mobility status  Activity Tolerance: Patient limited by pain Patient left: in bed;with call bell/phone within  reach;with bed alarm set  OT Visit Diagnosis: Unsteadiness on feet (R26.81);Muscle weakness (generalized) (M62.81)                Time: 1188-6773 OT Time Calculation (min): 28 min Charges:  OT General Charges $OT Visit: 1 Visit OT Evaluation $OT Eval Low Complexity: 1 Low $OT Eval Moderate Complexity: 1 Mod OT Treatments $Self Care/Home Management : 8-22 mins  Shanon Payor, OTD OTR/L  09/05/22, 4:12 PM

## 2022-09-05 NOTE — Progress Notes (Signed)
    Subjective  - POD #2, s/p angio with intervention  Says her left leg feels better, but still having pain around her wounds   Physical Exam:  Bilateral ulcers, left>right Both feet are warm to touch Groins without hematoma       Assessment/Plan:  POD #2  Continue ASA, statin, and plavix Possible right leg intervention later this week  Wells Kristie Olson 09/05/2022 6:36 PM --  Vitals:   09/05/22 0834 09/05/22 1642  BP: 126/71 (!) 117/58  Pulse: (!) 112 (!) 104  Resp: 18 18  Temp: 98.4 F (36.9 C) 98.1 F (36.7 C)  SpO2: 95% 94%    Intake/Output Summary (Last 24 hours) at 09/05/2022 1836 Last data filed at 09/05/2022 1409 Gross per 24 hour  Intake 760 ml  Output 1100 ml  Net -340 ml     Laboratory CBC    Component Value Date/Time   WBC 11.2 (H) 09/05/2022 0424   HGB 13.9 09/05/2022 0424   HGB 14.7 07/14/2013 2143   HCT 41.2 09/05/2022 0424   HCT 42.5 07/14/2013 2143   PLT 419 (H) 09/05/2022 0424   PLT 361 07/14/2013 2143    BMET    Component Value Date/Time   NA 133 (L) 09/05/2022 0424   NA 132 (L) 07/14/2013 2143   K 4.3 09/05/2022 0424   K 4.3 07/14/2013 2143   CL 94 (L) 09/05/2022 0424   CL 95 (L) 07/14/2013 2143   CO2 27 09/05/2022 0424   CO2 31 07/14/2013 2143   GLUCOSE 332 (H) 09/05/2022 0424   GLUCOSE 433 (H) 07/14/2013 2143   BUN 13 09/05/2022 0424   BUN 12 07/14/2013 2143   CREATININE 0.77 09/05/2022 0424   CREATININE 0.91 07/14/2013 2143   CALCIUM 9.0 09/05/2022 0424   CALCIUM 9.3 07/14/2013 2143   GFRNONAA >60 09/05/2022 0424   GFRNONAA >60 07/14/2013 2143   GFRAA >60 07/01/2020 1636   GFRAA >60 07/14/2013 2143    COAG Lab Results  Component Value Date   INR 1.0 09/02/2022   INR 0.90 07/27/2017   No results found for: "PTT"  Antibiotics Anti-infectives (From admission, onward)    Start     Dose/Rate Route Frequency Ordered Stop   09/03/22 1159  ceFAZolin (ANCEF) IVPB 2g/100 mL premix        2 g 200 mL/hr over  30 Minutes Intravenous 30 min pre-op 09/03/22 1159 09/03/22 1317        V. Leia Alf, M.D., [facility] [place] Office: [phone] Pager:  [phone]

## 2022-09-06 ENCOUNTER — Encounter: Payer: Self-pay | Admitting: Vascular Surgery

## 2022-09-06 DIAGNOSIS — E1165 Type 2 diabetes mellitus with hyperglycemia: Secondary | ICD-10-CM | POA: Diagnosis not present

## 2022-09-06 DIAGNOSIS — I739 Peripheral vascular disease, unspecified: Secondary | ICD-10-CM | POA: Diagnosis not present

## 2022-09-06 DIAGNOSIS — Z794 Long term (current) use of insulin: Secondary | ICD-10-CM | POA: Diagnosis not present

## 2022-09-06 LAB — CBC
HCT: 38.2 % (ref 36.0–46.0)
Hemoglobin: 12.8 g/dL (ref 12.0–15.0)
MCH: 31.1 pg (ref 26.0–34.0)
MCHC: 33.5 g/dL (ref 30.0–36.0)
MCV: 92.9 fL (ref 80.0–100.0)
Platelets: 378 10*3/uL (ref 150–400)
RBC: 4.11 MIL/uL (ref 3.87–5.11)
RDW: 13.2 % (ref 11.5–15.5)
WBC: 10.5 10*3/uL (ref 4.0–10.5)
nRBC: 0 % (ref 0.0–0.2)

## 2022-09-06 LAB — BASIC METABOLIC PANEL
Anion gap: 13 (ref 5–15)
BUN: 14 mg/dL (ref 8–23)
CO2: 26 mmol/L (ref 22–32)
Calcium: 9.1 mg/dL (ref 8.9–10.3)
Chloride: 93 mmol/L — ABNORMAL LOW (ref 98–111)
Creatinine, Ser: 0.87 mg/dL (ref 0.44–1.00)
GFR, Estimated: >60 mL/min (ref >60)
Glucose, Bld: 313 mg/dL — ABNORMAL HIGH (ref 70–99)
Potassium: 4.3 mmol/L (ref 3.5–5.1)
Sodium: 132 mmol/L — ABNORMAL LOW (ref 135–145)

## 2022-09-06 LAB — GLUCOSE, CAPILLARY
Glucose-Capillary: 368 mg/dL — ABNORMAL HIGH (ref 70–99)
Glucose-Capillary: 365 mg/dL — ABNORMAL HIGH (ref 70–99)
Glucose-Capillary: 119 mg/dL — ABNORMAL HIGH (ref 70–99)
Glucose-Capillary: 199 mg/dL — ABNORMAL HIGH (ref 70–99)
Glucose-Capillary: 180 mg/dL — ABNORMAL HIGH (ref 70–99)

## 2022-09-06 MED ORDER — INSULIN GLARGINE-YFGN 100 UNIT/ML ~~LOC~~ SOLN
50.0000 [IU] | Freq: Every day | SUBCUTANEOUS | Status: DC
  Administered 2022-09-06 – 2022-09-07 (×2): 50 [IU] via SUBCUTANEOUS
  Filled 2022-09-06 (×2): qty 0.5

## 2022-09-06 MED ORDER — DIPHENHYDRAMINE HCL 25 MG PO CAPS
25.0000 mg | ORAL_CAPSULE | Freq: Four times a day (QID) | ORAL | Status: DC | PRN
  Administered 2022-09-06 – 2022-09-10 (×9): 25 mg via ORAL
  Filled 2022-09-06 (×9): qty 1

## 2022-09-06 MED ORDER — INSULIN ASPART 100 UNIT/ML IJ SOLN
6.0000 [IU] | Freq: Three times a day (TID) | INTRAMUSCULAR | Status: DC
  Administered 2022-09-06 – 2022-09-07 (×4): 6 [IU] via SUBCUTANEOUS
  Filled 2022-09-06 (×4): qty 1

## 2022-09-06 MED ORDER — OXYCODONE-ACETAMINOPHEN 5-325 MG PO TABS
1.0000 | ORAL_TABLET | Freq: Four times a day (QID) | ORAL | Status: DC | PRN
  Administered 2022-09-06 – 2022-09-10 (×8): 2 via ORAL
  Filled 2022-09-06 (×8): qty 2

## 2022-09-06 NOTE — Plan of Care (Signed)
  Problem: Clinical Measurements: Goal: Will remain free from infection Outcome: Progressing   Problem: Clinical Measurements: Goal: Diagnostic test results will improve Outcome: Progressing   Problem: Activity: Goal: Risk for activity intolerance will decrease Outcome: Progressing   Problem: Nutrition: Goal: Adequate nutrition will be maintained Outcome: Progressing   Problem: Coping: Goal: Level of anxiety will decrease Outcome: Progressing   Problem: Elimination: Goal: Will not experience complications related to bowel motility Outcome: Progressing   Problem: Pain Managment: Goal: General experience of comfort will improve Outcome: Progressing

## 2022-09-06 NOTE — Progress Notes (Signed)
PROGRESS NOTE    Kristie Olson  MWN:[id number] DOB: June 09, 1956 DOA: 09/02/2022 PCP: [facility]    Assessment & Plan:   Principal Problem:   Claudication of both lower extremities ([place]) Active Problems:   Hypertension   Diabetes mellitus with hyperglycemia ([place])   PAD (peripheral artery disease) ([place])  Assessment and Plan: PAD: presented w/ leg leg pain, claudication & chronic ulcerations. 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 S/p angioplasty of left SFA & proximal popliteal artery and stent placement to left SFA on 09/03/22 as per vasc surg. Possible RLE angioplasty on 09/08/22 but possibly after as per vasc surg. Continue on statin, plavix  DM2: poorly controlled, HbA1c 8.2. Increased glargine, continue on SSI. Pt refuses carb modified diet. Continue on regular diet as per pt's request   HTN: continue on HCTZ, lisinopril   Constipation: continue on colace, miralax   Obesity: BMI 39.0. Complicates overall care & prognosis   Thrombocytosis: WNL today   Depression: severity unknown. Continue on home dose of fluoxetine   DVT prophylaxis: lovenox  Code Status: full  Family Communication:  Disposition Plan: likely d/c home w/ [facility] as pt refuses SNF  Level of care: Med-Surg  Status is: Inpatient Remains inpatient appropriate because: severity of illness   Consultants:  Vasc surg   Procedures:   Antimicrobials:    Subjective: Pt c/o itching with taking pain meds   Objective: Vitals:   09/05/22 1642 09/05/22 2000 09/06/22 0527 09/06/22 0730  BP: (!) 117/58 102/65 110/65 133/69  Pulse: (!) 104 98 (!) 109 (!) 110  Resp: '18 18 18 16  '$ Temp: 98.1 F (36.7 C)  98.6 F (37 C) 99.3 F (37.4 C)  TempSrc: Oral  Oral Oral  SpO2: 94% 96% 95% 94%  Weight:      Height:        Intake/Output Summary (Last 24 hours) at 09/06/2022 0817 Last data filed at 09/05/2022  2156 Gross per 24 hour  Intake 960 ml  Output --  Net 960 ml   Filed Weights   09/02/22 1724  Weight: 90.7 kg    Examination:  General exam: Appears frustrated  Respiratory system: clear breath sounds b/l  Cardiovascular system: S1/S2+. No rubs or clicks  Gastrointestinal system: Abd is soft, NT, obese & hypoactive bowel sounds  Central nervous system: Alert and oriented. Moves all extremities  Psychiatry: Judgement and insight appears at baseline. Frustrated mood and affect    Data Reviewed: I have personally reviewed following labs and imaging studies  CBC: Recent Labs  Lab 09/02/22 1738 09/03/22 0927 09/04/22 0546 09/05/22 0424 09/06/22 0514  WBC 9.0 6.3 9.3 11.2* 10.5  HGB 13.7 13.3 13.4 13.9 12.8  HCT 41.7 40.2 40.0 41.2 38.2  MCV 95.4 94.8 94.1 93.4 92.9  PLT 493* 440* 434* 419* 403   Basic Metabolic Panel: Recent Labs  Lab 09/02/22 1738 09/03/22 0627 09/03/22 0927 09/04/22 0546 09/05/22 0424 09/06/22 0514  NA 138  --  134* 133* 133* 132*  K 3.9  --  4.2 4.2 4.3 4.3  CL 100  --  98 97* 94* 93*  CO2 29  --  '25 27 27 26  '$ GLUCOSE 80  --  325* 327* 332* 313*  BUN 12  --  '11 12 13 14  '$ CREATININE 0.75 0.80 0.70 0.90 0.77 0.87  CALCIUM 9.6  --  9.2 8.9 9.0 9.1   GFR: Estimated Creatinine Clearance: 62.1 mL/min (  by C-G formula based on SCr of 0.87 mg/dL). Liver Function Tests: No results for input(s): "AST", "ALT", "ALKPHOS", "BILITOT", "PROT", "ALBUMIN" in the last 168 hours. No results for input(s): "LIPASE", "AMYLASE" in the last 168 hours. No results for input(s): "AMMONIA" in the last 168 hours. Coagulation Profile: Recent Labs  Lab 09/02/22 2322  INR 1.0   Cardiac Enzymes: No results for input(s): "CKTOTAL", "CKMB", "CKMBINDEX", "TROPONINI" in the last 168 hours. BNP (last 3 results) No results for input(s): "PROBNP" in the last 8760 hours. HbA1C: No results for input(s): "HGBA1C" in the last 72 hours.  CBG: Recent Labs  Lab  09/05/22 0838 09/05/22 1224 09/05/22 1732 09/05/22 2146 09/06/22 0745  GLUCAP 399* 306* 279* 369* 368*   Lipid Profile: No results for input(s): "CHOL", "HDL", "LDLCALC", "TRIG", "CHOLHDL", "LDLDIRECT" in the last 72 hours. Thyroid Function Tests: No results for input(s): "TSH", "T4TOTAL", "FREET4", "T3FREE", "THYROIDAB" in the last 72 hours. Anemia Panel: No results for input(s): "VITAMINB12", "FOLATE", "FERRITIN", "TIBC", "IRON", "RETICCTPCT" in the last 72 hours. Sepsis Labs: No results for input(s): "PROCALCITON", "LATICACIDVEN" in the last 168 hours.  No results found for this or any previous visit (from the past 240 hour(s)).       Radiology Studies: No results found.      Scheduled Meds:  aspirin EC  81 mg Oral Daily   atorvastatin  10 mg Oral Daily   clopidogrel  75 mg Oral Daily   docusate sodium  200 mg Oral BID   FLUoxetine  40 mg Oral Daily   lisinopril  20 mg Oral Daily   And   hydrochlorothiazide  25 mg Oral Daily   insulin aspart  0-20 Units Subcutaneous TID WC   insulin aspart  0-5 Units Subcutaneous QHS   insulin aspart  6 Units Subcutaneous TID WC   insulin glargine-yfgn  50 Units Subcutaneous Daily   pantoprazole  40 mg Oral Daily   polyethylene glycol  17 g Oral Daily   senna  1 tablet Oral BID   Continuous Infusions:     LOS: 3 days    Time spent: 33 mins     Wyvonnia Dusky, MD [facility] Pager [phone]  If 7PM-7AM, please contact night-coverage [email or web address] 09/06/2022, 8:17 AM

## 2022-09-06 NOTE — Plan of Care (Signed)
?  Problem: Activity: ?Goal: Risk for activity intolerance will decrease ?Outcome: Progressing ?  ?Problem: Nutrition: ?Goal: Adequate nutrition will be maintained ?Outcome: Progressing ?  ?Problem: Elimination: ?Goal: Will not experience complications related to urinary retention ?Outcome: Progressing ?  ?Problem: Safety: ?Goal: Ability to remain free from injury will improve ?Outcome: Progressing ?  ?

## 2022-09-06 NOTE — Inpatient Diabetes Management (Signed)
Inpatient Diabetes Program Recommendations  AACE/ADA: New Consensus Statement on Inpatient Glycemic Control   Target Ranges:  Prepandial:   less than 140 mg/dL      Peak postprandial:   less than 180 mg/dL (1-2 hours)      Critically ill patients:  140 - 180 mg/dL    Latest Reference Range & Units 09/05/22 08:38 09/05/22 12:24 09/05/22 17:32 09/05/22 21:46  Glucose-Capillary 70 - 99 mg/dL 399 (H) 306 (H) 279 (H) 369 (H)   Review of Glycemic Control  Diabetes history: DM2 Outpatient Diabetes medications: 70/30 70 units QAM, 70/30 25 units QPM,  Glipizide 10 mg daily, Ozempic 2 mg Qweek (only taken 1 time in past month) Current orders for Inpatient glycemic control: Semglee 25 units daily, Novolog 0-20 units TID with meals, Novolog 0-5 units QHS  Inpatient Diabetes Program Recommendations:    Insulin: Please consider increasing Semglee to 50 units daily and adding Novolog 6 units TID with meals for meal coverage if patient eats at least 50% of meals.  Outpatient: Patient reports that she developed pancreatitis after first dose of Ozempic and she tried again within past month and it made her feel very bad. Would recommend patient discontinue Ozempic as an outpatient especially if she developed pancreatitis.  NOTE: [name], RN reached out to diabetes team about patient being upset about her glucose trends.  Spoke with patient over the phone regarding DM control. Patient confirms that she is taking 70/30 70 units QAM, 70/30 25 units QPM, and Glipizide 10 mg daily. She reports that she sometimes skips the Glipizide if her sugars are running low. Patient states that she has only taken the Ozmepic 2 times since prescribed. She states that she developed pancreatitis after she took it the first time and her PCP really wanted her to take the medication so she tried it again with the past month and it made her feel very bad so she has not taken it since. Discussed that if she developed pancreatitis  after taking the Ozempic that she likely needs to discontinue it but I would let attending provider know and they could address on discharge.  Patient states that she ran out of insulin last week (called it in to pharmacy on Tuesday) and she was out of insulin until Thursday; therefore, CBGs were much higher over those 2 days. Patient states she has all need DM medications and supplies currently. Patient states that she has seen Endocrinology in the past at [facility] and that the provider she seen 1 time for initial visit left the practice and she has not followed up since then. Encouraged patient to call [facility] Endocrinology and see if she can establish care with a different provider in the practice. Patient verbalized understanding of information discussed. Patient reports that she is having a lot of pain and that the oral pain medication she is ordered it causing severe itching and asked if she could get something else for pain. Informed patient that I would let Dr. Jimmye Norman know about her request. Sent chat message to Dr. Jimmye Norman and Kristie Mola, RN regarding conversation with patient and her request.   Thanks, Barnie Alderman, RN, MSN, [place] Diabetes Coordinator Inpatient Diabetes Program [phone] (Team Pager from 8am to [place])

## 2022-09-07 DIAGNOSIS — I70238 Atherosclerosis of native arteries of right leg with ulceration of other part of lower right leg: Secondary | ICD-10-CM

## 2022-09-07 DIAGNOSIS — I70222 Atherosclerosis of native arteries of extremities with rest pain, left leg: Secondary | ICD-10-CM

## 2022-09-07 DIAGNOSIS — I739 Peripheral vascular disease, unspecified: Secondary | ICD-10-CM | POA: Diagnosis not present

## 2022-09-07 DIAGNOSIS — Z794 Long term (current) use of insulin: Secondary | ICD-10-CM | POA: Diagnosis not present

## 2022-09-07 DIAGNOSIS — E1165 Type 2 diabetes mellitus with hyperglycemia: Secondary | ICD-10-CM | POA: Diagnosis not present

## 2022-09-07 LAB — GLUCOSE, CAPILLARY
Glucose-Capillary: 263 mg/dL — ABNORMAL HIGH (ref 70–99)
Glucose-Capillary: 228 mg/dL — ABNORMAL HIGH (ref 70–99)
Glucose-Capillary: 220 mg/dL — ABNORMAL HIGH (ref 70–99)
Glucose-Capillary: 197 mg/dL — ABNORMAL HIGH (ref 70–99)

## 2022-09-07 LAB — BASIC METABOLIC PANEL
Anion gap: 14 (ref 5–15)
BUN: 18 mg/dL (ref 8–23)
CO2: 24 mmol/L (ref 22–32)
Calcium: 9.2 mg/dL (ref 8.9–10.3)
Chloride: 94 mmol/L — ABNORMAL LOW (ref 98–111)
Creatinine, Ser: 0.84 mg/dL (ref 0.44–1.00)
GFR, Estimated: >60 mL/min (ref >60)
Glucose, Bld: 232 mg/dL — ABNORMAL HIGH (ref 70–99)
Potassium: 4.4 mmol/L (ref 3.5–5.1)
Sodium: 132 mmol/L — ABNORMAL LOW (ref 135–145)

## 2022-09-07 LAB — CBC
HCT: 37 % (ref 36.0–46.0)
Hemoglobin: 12.6 g/dL (ref 12.0–15.0)
MCH: 32 pg (ref 26.0–34.0)
MCHC: 34.1 g/dL (ref 30.0–36.0)
MCV: 93.9 fL (ref 80.0–100.0)
Platelets: 385 10*3/uL (ref 150–400)
RBC: 3.94 MIL/uL (ref 3.87–5.11)
RDW: 13 % (ref 11.5–15.5)
WBC: 9.1 10*3/uL (ref 4.0–10.5)
nRBC: 0 % (ref 0.0–0.2)

## 2022-09-07 MED ORDER — MINERAL OIL LIGHT OIL
TOPICAL_OIL | Status: DC | PRN
  Filled 2022-09-07: qty 10

## 2022-09-07 MED ORDER — INSULIN ASPART 100 UNIT/ML IJ SOLN
8.0000 [IU] | Freq: Three times a day (TID) | INTRAMUSCULAR | Status: DC
  Administered 2022-09-07 – 2022-09-10 (×7): 8 [IU] via SUBCUTANEOUS
  Filled 2022-09-07 (×8): qty 1

## 2022-09-07 MED ORDER — INSULIN GLARGINE-YFGN 100 UNIT/ML ~~LOC~~ SOLN
5.0000 [IU] | Freq: Once | SUBCUTANEOUS | Status: AC
  Administered 2022-09-07: 5 [IU] via SUBCUTANEOUS
  Filled 2022-09-07: qty 0.05

## 2022-09-07 MED ORDER — AMITRIPTYLINE HCL 10 MG PO TABS
10.0000 mg | ORAL_TABLET | Freq: Every day | ORAL | Status: DC
  Administered 2022-09-07 – 2022-09-09 (×3): 10 mg via ORAL
  Filled 2022-09-07 (×3): qty 1

## 2022-09-07 MED ORDER — INSULIN GLARGINE-YFGN 100 UNIT/ML ~~LOC~~ SOLN
55.0000 [IU] | Freq: Every day | SUBCUTANEOUS | Status: DC
  Filled 2022-09-07: qty 0.55

## 2022-09-07 NOTE — Progress Notes (Signed)
Occupational Therapy Treatment Patient Details Name: Kristie Olson MRN: [id number] DOB: 1956/03/16 Today's Date: 09/07/2022   History of present illness Pt is a 66 year old female admitted with L leg pain, claudication & chronic ulcerations. 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 S/p angioplasty of left SFA & proximal popliteal artery and stent placement to left SFA on 09/03/22 as per vasc surg   OT comments  Chart reviewed, pt greeted in bed agreeable to OT tx session. Pt continues to present with poor insight into level of functioning despite significant education provided re: safety of tranfers, home set up, falls prevention. Pt continues to report she plans to discharge home. Tx session targeted improving activity tolerance/ functional mobility for ADL tasks. Improvements noted in STS requiring MIN A with RW, transfer to bedside chair with MIN A with RW, short shuffling steps. Pt is left with MD in room, all needs met. RN/NT aware of pt status. OT will continue to follow acutely.    Recommendations for follow up therapy are one component of a multi-disciplinary discharge planning process, led by the attending physician.  Recommendations may be updated based on patient status, additional functional criteria and insurance authorization.    Follow Up Recommendations  Skilled nursing-short term rehab (<3 hours/day)    Assistance Recommended at Discharge Frequent or constant Supervision/Assistance  Patient can return home with the following  A lot of help with walking and/or transfers;A lot of help with bathing/dressing/bathroom   Equipment Recommendations  Wheelchair (measurements OT)    Recommendations for Other Services      Precautions / Restrictions Precautions Precautions: Fall Restrictions Weight Bearing Restrictions: No       Mobility Bed Mobility Overal bed mobility: Needs Assistance Bed  Mobility: Supine to Sit     Supine to sit: Mod assist          Transfers Overall transfer level: Needs assistance Equipment used: Rolling walker (2 wheels) Transfers: Sit to/from Stand Sit to Stand: Min assist                 Balance Overall balance assessment: Needs assistance Sitting-balance support: Feet supported Sitting balance-Leahy Scale: Good     Standing balance support: Bilateral upper extremity supported, During functional activity Standing balance-Leahy Scale: Poor                             ADL either performed or assessed with clinical judgement   ADL Overall ADL's : Needs assistance/impaired Eating/Feeding: Set up;Sitting                   Lower Body Dressing: Moderate assistance   Toilet Transfer: Minimal assistance;Rolling walker (2 wheels) Toilet Transfer Details (indicate cue type and reason): simulated, small shuffling steps to bedside chair           General ADL Comments: step by step vcs for encouragement for participation    Extremity/Trunk Assessment              Vision       Perception     Praxis      Cognition Arousal/Alertness: Awake/alert Behavior During Therapy: WFL for tasks assessed/performed Overall Cognitive Status: No family/caregiver present to determine baseline cognitive functioning Area of Impairment: Safety/judgement, Awareness, Problem solving  Safety/Judgement: Decreased awareness of safety, Decreased awareness of deficits Awareness: Emergent Problem Solving: Requires verbal cues, Requires tactile cues          Exercises Other Exercises Other Exercises: education provided re: role of rehab, current level of functioning, use of purewik if pt plans to return home (ie safe ADL completion, no pure wik avalabile)    Shoulder Instructions       General Comments      Pertinent Vitals/ Pain       Pain Assessment Pain Assessment: Faces Faces  Pain Scale: Hurts little more Pain Location: peri are Pain Descriptors / Indicators: Discomfort Pain Intervention(s): Limited activity within patient's tolerance, Monitored during session, Repositioned  Home Living                                          Prior Functioning/Environment              Frequency  Min 2X/week        Progress Toward Goals  OT Goals(current goals can now be found in the care plan section)  Progress towards OT goals: Progressing toward goals     Plan Discharge plan remains appropriate    Co-evaluation                 AM-PAC OT "6 Clicks" Daily Activity     Outcome Measure   Help from another person eating meals?: None Help from another person taking care of personal grooming?: None Help from another person toileting, which includes using toliet, bedpan, or urinal?: A Lot Help from another person bathing (including washing, rinsing, drying)?: A Lot Help from another person to put on and taking off regular upper body clothing?: A Little Help from another person to put on and taking off regular lower body clothing?: A Lot 6 Click Score: 17    End of Session Equipment Utilized During Treatment: Rolling walker (2 wheels)  OT Visit Diagnosis: Unsteadiness on feet (R26.81);Muscle weakness (generalized) (M62.81)   Activity Tolerance Patient tolerated treatment well   Patient Left in chair;with call bell/phone within reach;with chair alarm set   Nurse Communication Mobility status (NT notified of pt status as well)        Time: 1040-1055 OT Time Calculation (min): 15 min  Charges: OT General Charges $OT Visit: 1 Visit OT Treatments $Therapeutic Activity: 8-22 mins  Shanon Payor, OTD OTR/L  09/07/22, 11:08 AM

## 2022-09-07 NOTE — Plan of Care (Signed)
  Problem: Clinical Measurements: Goal: Will remain free from infection Outcome: Progressing   Problem: Clinical Measurements: Goal: Diagnostic test results will improve Outcome: Progressing   Problem: Nutrition: Goal: Adequate nutrition will be maintained Outcome: Progressing   Problem: Activity: Goal: Risk for activity intolerance will decrease Outcome: Progressing   Problem: Coping: Goal: Level of anxiety will decrease Outcome: Progressing

## 2022-09-07 NOTE — Progress Notes (Signed)
Physical Therapy Treatment Patient Details Name: Kristie Olson MRN: [id number] DOB: 08/12/1956 Today's Date: 09/07/2022   History of Present Illness Pt is a 66 year old female admitted with L leg pain, claudication & chronic ulcerations. 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 S/p angioplasty of left SFA & proximal popliteal artery and stent placement to left SFA on 09/03/22 as per vasc surgery.    PT Comments    Pt resting in bed upon PT arrival; pt reporting recently getting back to bed but pt reporting being motivated to get better and quickly agreeable to working with therapy.  During session pt mod assist with bed mobility; CGA to min assist with transfers using RW; and CGA to ambulate a few feet bed to recliner and then (after sitting rest break) recliner to bed with RW use (limited distance ambulating d/t pt reporting B LE's feeling really heavy and also B LE pain).  Pain 6/10 B LE's end of session at rest (pt declining pain meds and reports she will ask for pain meds after lunch--nursing notified via secure message).  Will continue to focus on strengthening and progressive functional mobility during hospitalization.    Recommendations for follow up therapy are one component of a multi-disciplinary discharge planning process, led by the attending physician.  Recommendations may be updated based on patient status, additional functional criteria and insurance authorization.  Follow Up Recommendations  Skilled nursing-short term rehab (<3 hours/day) Can patient physically be transported by private vehicle: No   Assistance Recommended at Discharge Frequent or constant Supervision/Assistance  Patient can return home with the following A lot of help with walking and/or transfers;A little help with bathing/dressing/bathroom;Direct supervision/assist for medications management;Direct supervision/assist for  financial management;Assistance with cooking/housework;Assist for transportation;Help with stairs or ramp for entrance   Equipment Recommendations  BSC/3in1;Rolling walker (2 wheels)    Recommendations for Other Services       Precautions / Restrictions Precautions Precautions: Fall Restrictions Weight Bearing Restrictions: No     Mobility  Bed Mobility Overal bed mobility: Needs Assistance Bed Mobility: Supine to Sit     Supine to sit: Mod assist (assist for trunk) Sit to supine: Mod assist (assist for B LE's)   General bed mobility comments: vc's for technique    Transfers Overall transfer level: Needs assistance Equipment used: Rolling walker (2 wheels) Transfers: Sit to/from Stand Sit to Stand: Min assist, Min guard           General transfer comment: min assist to stand from bed; CGA to stand from recliner; vc's for UE placement but pt preferred to keep B UE's on walker to stand/sit; increased effort/time to stand noted    Ambulation/Gait Ambulation/Gait assistance: Min guard Gait Distance (Feet):  (3 feet x2 (bed to/from recliner)) Assistive device: Rolling walker (2 wheels)   Gait velocity: decreased     General Gait Details: increased effort to take steps; decreased B LE step length and foot clearance   Stairs             Wheelchair Mobility    Modified Rankin (Stroke Patients Only)       Balance Overall balance assessment: Needs assistance Sitting-balance support: No upper extremity supported, Feet supported Sitting balance-Leahy Scale: Good Sitting balance - Comments: steady sitting reaching within BOS   Standing balance support: Bilateral upper extremity supported, During functional activity Standing balance-Leahy Scale: Fair Standing balance comment: steady with standing activities with B UE support  Cognition Arousal/Alertness: Awake/alert Behavior During Therapy: WFL for tasks  assessed/performed Overall Cognitive Status: No family/caregiver present to determine baseline cognitive functioning Area of Impairment: Safety/judgement, Awareness, Problem solving                         Safety/Judgement: Decreased awareness of safety, Decreased awareness of deficits Awareness: Emergent Problem Solving: Requires verbal cues, Requires tactile cues          Exercises Total Joint Exercises Long Arc Quad: AROM, Strengthening, Both, 5 reps, Seated    General Comments General comments (skin integrity, edema, etc.): wounds on B LE's (dressings in place).  Nursing cleared pt for participation in physical therapy.  Pt agreeable to PT session.      Pertinent Vitals/Pain Pain Assessment Pain Score: 6  Pain Location: B LE's Pain Descriptors / Indicators: Aching, Discomfort, Tender Pain Intervention(s): Limited activity within patient's tolerance, Monitored during session, Premedicated before session, Repositioned, Other (comment) (pt declining pain meds at this time (pt reports she will request pain meds after lunch)--nurse notified) Vitals (HR and O2 on room air) stable and WFL throughout treatment session.    Home Living                          Prior Function            PT Goals (current goals can now be found in the care plan section) Acute Rehab PT Goals Patient Stated Goal: go home PT Goal Formulation: With patient Time For Goal Achievement: 09/19/22 Potential to Achieve Goals: Fair Progress towards PT goals: Progressing toward goals    Frequency    Min 2X/week      PT Plan Current plan remains appropriate    Co-evaluation              AM-PAC PT "6 Clicks" Mobility   Outcome Measure  Help needed turning from your back to your side while in a flat bed without using bedrails?: A Little Help needed moving from lying on your back to sitting on the side of a flat bed without using bedrails?: A Lot Help needed moving to and  from a bed to a chair (including a wheelchair)?: A Little Help needed standing up from a chair using your arms (e.g., wheelchair or bedside chair)?: A Little Help needed to walk in hospital room?: A Lot Help needed climbing 3-5 steps with a railing? : Total 6 Click Score: 14    End of Session Equipment Utilized During Treatment: Gait belt Activity Tolerance: Patient tolerated treatment well;Patient limited by pain Patient left: in bed;with call bell/phone within reach;with bed alarm set Nurse Communication: Mobility status;Precautions;Other (comment) (pt's pain status) PT Visit Diagnosis: Unsteadiness on feet (R26.81);Muscle weakness (generalized) (M62.81);Difficulty in walking, not elsewhere classified (R26.2);Pain Pain - Right/Left:  (B) Pain - part of body: Leg     Time: 1610-9604 PT Time Calculation (min) (ACUTE ONLY): 38 min  Charges:  $Therapeutic Activity: 38-52 mins                    Leitha Bleak, PT 09/07/22, 1:18 PM

## 2022-09-07 NOTE — Progress Notes (Signed)
[facility]  Daily Progress Note   Subjective  -   Resting in bed comfortably notes some soreness in the left lower extremity  Objective Vitals:   09/06/22 0730 09/06/22 1630 09/06/22 2050 09/07/22 0742  BP: 133/69 (!) 96/55 (!) 108/45 (!) 109/51  Pulse: (!) 110 93 (!) 106 (!) 104  Resp: '16 15 18 18  '$ Temp: 99.3 F (37.4 C) 98.5 F (36.9 C) 98.3 F (36.8 C) 98.4 F (36.9 C)  TempSrc: Oral Oral Oral Oral  SpO2: 94% 93% 97% 98%  Weight:      Height:        Intake/Output Summary (Last 24 hours) at 09/07/2022 1326 Last data filed at 09/07/2022 0800 Gross per 24 hour  Intake 840 ml  Output 1050 ml  Net -210 ml    PULM  CTAB CV  RRR VASC  2+ DP palpable pulse of left lower extremity  Laboratory CBC    Component Value Date/Time   WBC 9.1 09/07/2022 0424   HGB 12.6 09/07/2022 0424   HGB 14.7 07/14/2013 2143   HCT 37.0 09/07/2022 0424   HCT 42.5 07/14/2013 2143   PLT 385 09/07/2022 0424   PLT 361 07/14/2013 2143    BMET    Component Value Date/Time   NA 132 (L) 09/07/2022 0424   NA 132 (L) 07/14/2013 2143   K 4.4 09/07/2022 0424   K 4.3 07/14/2013 2143   CL 94 (L) 09/07/2022 0424   CL 95 (L) 07/14/2013 2143   CO2 24 09/07/2022 0424   CO2 31 07/14/2013 2143   GLUCOSE 232 (H) 09/07/2022 0424   GLUCOSE 433 (H) 07/14/2013 2143   BUN 18 09/07/2022 0424   BUN 12 07/14/2013 2143   CREATININE 0.84 09/07/2022 0424   CREATININE 0.91 07/14/2013 2143   CALCIUM 9.2 09/07/2022 0424   CALCIUM 9.3 07/14/2013 2143   GFRNONAA >60 09/07/2022 0424   GFRNONAA >60 07/14/2013 2143   GFRAA >60 07/01/2020 1636   GFRAA >60 07/14/2013 2143    Assessment/Planning: POD #4 s/p left lower extremity angiogram   Patient has some swelling and soreness of the left lower extremity which is part of the course post intervention.  However foot is warm with strong palpable pulses which were not present before. The patient continues to have pain in the right lower  extremity as well.  CT angiogram showed extensive disease with a known SFA occlusion.  We will plan for intervention of the patient's right lower extremity on tomorrow 7/25  Kris Hartmann  09/07/2022, 1:26 PM

## 2022-09-07 NOTE — TOC Progression Note (Addendum)
Transition of Care [facility]) - Progression Note    Patient Details  Name: RMANI KAPUSTA MRN: [id number] Date of Birth: May 20, 1956  Transition of Care [facility]) CM/SW Contact  Beverly Sessions, RN Phone Number: 09/07/2022, 2:37 PM  Clinical Narrative:     Plan for vascular procedure tomorrow Followed up with patient about home health services.  She is agreeable to home health. States she does not have a preference of home health agency.  Referral made and accepted by Tommi Rumps with [facility] for RN PT OT   Patient agreeable for [facility] and RW.  Ordered.  Will be obtained closer to discharge   Expected Discharge Plan: [place] Barriers to Discharge: Continued Medical Work up  Expected Discharge Plan and Services Expected Discharge Plan: [place] Choice: [place] arrangements for the past 2 months: Single Family Home                                       Social Determinants of Health (SDOH) Interventions    Readmission Risk Interventions     No data to display

## 2022-09-07 NOTE — Progress Notes (Signed)
PROGRESS NOTE   HPI was taken from Dr. Linda Hedges: Kristie Olson is a 66 y/o with a history of DM, CAD, HTN and HLD, recurrent pancreatitis. Patient self-reports a lifelong smoking history with continued 1 PPD smoking.  Estimate 50+ pack year smoker history.She presents to the ED due to acute on chronic lower extremity pain.  She reports symptoms of claudication chronically.  Her left leg is worse than her right.  She reports increasing pain over the past 1 week and rest pain over the past few days.  No falls or injuries, fevers.  Reports chronic ulcerations to her lower extremities.    ED Course: 98.2  149/73  91  17. Patient with leg pain but no acute distress otherwise. Leg ulcers noted. Poor DP pulses. Lab - Bmet - n, CBC nl. CTA - advanced narrowing/occlusion bilateral superficial femoral aa with distal reconstitution and 3 vessel runoff. EDP consult vascular surgery who recommended starting Heparin infusion and have TRH admit. VS will see later in the AM  As per Dr. 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 Pt is s/p angioplasty of left SFA & proximal popliteal artery & stent placement to left SFA on 09/03/22 as per vascular. Possible angioplasty on 09/08/22 but possibly after that date as per vasc surg. Will make pt NPO after midnight just in case. Of note, PT/OT evaluated pt and recommends SNF. Pt refuses SNF but is agreeable to home health.      Kristie Olson  XIP:[id number] DOB: 1956/05/20 DOA: 09/02/2022 PCP: [facility]    Assessment & Plan:   Principal Problem:   Claudication of both lower extremities ([place]) Active Problems:   Hypertension   Diabetes mellitus with hyperglycemia ([place])   PAD (peripheral artery disease) ([place])  Assessment and Plan: PAD: presented w/ leg leg pain, claudication & chronic ulcerations. 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 S/p angioplasty of left SFA & proximal popliteal artery and stent placement to left SFA on 09/03/22 as per vasc surg. Possible RLE angioplasty on 09/08/22 but possibly after as per vasc surg. Continue on plavix, statin. NPO after midnight.   DM2: poorly controlled, HbA1c 8.2. Increased glargine, continue on SSI. Pt refuses carb modified diet. Continue on regular diet as per pt's request   HTN: continue on lisinopril, HCTZ  Constipation: resolved w/ colace, miralax   Obesity: BMI 39.0. Complicates overall care & prognosis   Thrombocytosis: WNL again today   Depression: severity unknown. Continue on home dose of fluoxetine   DVT prophylaxis: lovenox  Code Status: full  Family Communication:  Disposition Plan: likely d/c home w/ HH as pt refuses SNF  Level of care: Med-Surg  Status is: Inpatient Remains inpatient appropriate because: severity of illness, likely vascular procedure tomorrow   Consultants:  Vasc surg   Procedures:   Antimicrobials:    Subjective: Pt c/o leg pain  Objective: Vitals:   09/06/22 0730 09/06/22 1630 09/06/22 2050 09/07/22 0742  BP: 133/69 (!) 96/55 (!) 108/45 (!) 109/51  Pulse: (!) 110 93 (!) 106 (!) 104  Resp: '16 15 18 18  '$ Temp: 99.3 F (37.4 C) 98.5 F (36.9 C) 98.3 F (36.8 C) 98.4 F (36.9 C)  TempSrc: Oral Oral Oral Oral  SpO2: 94% 93% 97% 98%  Weight:      Height:  Intake/Output Summary (Last 24 hours) at 09/07/2022 0844 Last data filed at 09/07/2022 0203 Gross per 24 hour  Intake 840 ml  Output 850 ml  Net -10 ml   Filed Weights   09/02/22 1724  Weight: 90.7 kg    Examination:  General exam: Appears calm & comfortable   Respiratory system: clear breath sounds b/l  Cardiovascular system: S1 & S2+. No rubs or gallops  Gastrointestinal system: abd is soft, NT, obese & normal bowel sounds  Central nervous  system: alert and oriented. Moves all extremities  Psychiatry: judgement and insight appears at baseline. Flat mood and affect    Data Reviewed: I have personally reviewed following labs and imaging studies  CBC: Recent Labs  Lab 09/03/22 0927 09/04/22 0546 09/05/22 0424 09/06/22 0514 09/07/22 0424  WBC 6.3 9.3 11.2* 10.5 9.1  HGB 13.3 13.4 13.9 12.8 12.6  HCT 40.2 40.0 41.2 38.2 37.0  MCV 94.8 94.1 93.4 92.9 93.9  PLT 440* 434* 419* 378 629   Basic Metabolic Panel: Recent Labs  Lab 09/03/22 0927 09/04/22 0546 09/05/22 0424 09/06/22 0514 09/07/22 0424  NA 134* 133* 133* 132* 132*  K 4.2 4.2 4.3 4.3 4.4  CL 98 97* 94* 93* 94*  CO2 '25 27 27 26 24  '$ GLUCOSE 325* 327* 332* 313* 232*  BUN '11 12 13 14 18  '$ CREATININE 0.70 0.90 0.77 0.87 0.84  CALCIUM 9.2 8.9 9.0 9.1 9.2   GFR: Estimated Creatinine Clearance: 64.4 mL/min (by C-G formula based on SCr of 0.84 mg/dL). Liver Function Tests: No results for input(s): "AST", "ALT", "ALKPHOS", "BILITOT", "PROT", "ALBUMIN" in the last 168 hours. No results for input(s): "LIPASE", "AMYLASE" in the last 168 hours. No results for input(s): "AMMONIA" in the last 168 hours. Coagulation Profile: Recent Labs  Lab 09/02/22 2322  INR 1.0   Cardiac Enzymes: No results for input(s): "CKTOTAL", "CKMB", "CKMBINDEX", "TROPONINI" in the last 168 hours. BNP (last 3 results) No results for input(s): "PROBNP" in the last 8760 hours. HbA1C: No results for input(s): "HGBA1C" in the last 72 hours.  CBG: Recent Labs  Lab 09/06/22 1153 09/06/22 1633 09/06/22 2052 09/06/22 2138 09/07/22 0747  GLUCAP 365* 119* 199* 180* 263*   Lipid Profile: No results for input(s): "CHOL", "HDL", "LDLCALC", "TRIG", "CHOLHDL", "LDLDIRECT" in the last 72 hours. Thyroid Function Tests: No results for input(s): "TSH", "T4TOTAL", "FREET4", "T3FREE", "THYROIDAB" in the last 72 hours. Anemia Panel: No results for input(s): "VITAMINB12", "FOLATE", "FERRITIN",  "TIBC", "IRON", "RETICCTPCT" in the last 72 hours. Sepsis Labs: No results for input(s): "PROCALCITON", "LATICACIDVEN" in the last 168 hours.  No results found for this or any previous visit (from the past 240 hour(s)).       Radiology Studies: No results found.      Scheduled Meds:  aspirin EC  81 mg Oral Daily   atorvastatin  10 mg Oral Daily   clopidogrel  75 mg Oral Daily   docusate sodium  200 mg Oral BID   FLUoxetine  40 mg Oral Daily   lisinopril  20 mg Oral Daily   And   hydrochlorothiazide  25 mg Oral Daily   insulin aspart  0-20 Units Subcutaneous TID WC   insulin aspart  0-5 Units Subcutaneous QHS   insulin aspart  6 Units Subcutaneous TID WC   insulin glargine-yfgn  50 Units Subcutaneous Daily   pantoprazole  40 mg Oral Daily   polyethylene glycol  17 g Oral Daily   senna  1 tablet Oral  BID   Continuous Infusions:     LOS: 4 days    Time spent: 30 mins     Wyvonnia Dusky, MD [facility] Pager [phone]  If 7PM-7AM, please contact night-coverage [email or web address] 09/07/2022, 8:44 AM

## 2022-09-07 NOTE — Care Management Important Message (Signed)
Important Message  Patient Details  Name: Kristie Olson MRN: [id number] Date of Birth: July 31, 1956   Medicare Important Message Given:  Yes     Dannette Barbara 09/07/2022, 11:41 AM

## 2022-09-07 NOTE — Plan of Care (Signed)
Patient worked with PT and OT this shift. Pain meds given throughout the day. Adequate appetite. No falls this shift. VSS. Will continue to monitor.

## 2022-09-07 NOTE — Inpatient Diabetes Management (Signed)
Inpatient Diabetes Program Recommendations  AACE/ADA: New Consensus Statement on Inpatient Glycemic Control   Target Ranges:  Prepandial:   less than 140 mg/dL      Peak postprandial:   less than 180 mg/dL (1-2 hours)      Critically ill patients:  140 - 180 mg/dL    Latest Reference Range & Units 09/06/22 07:45 09/06/22 11:53 09/06/22 16:33 09/06/22 20:52 09/06/22 21:38 09/07/22 07:47  Glucose-Capillary 70 - 99 mg/dL 368 (H) 365 (H) 119 (H) 199 (H) 180 (H) 263 (H)   Review of Glycemic Control  Diabetes history: DM2 Outpatient Diabetes medications: 70/30 70 units QAM, 70/30 25 units QPM,  Glipizide 10 mg daily, Ozempic 2 mg Qweek (only taken 1 time in past month) Current orders for Inpatient glycemic control: Semglee 50 units daily, Novolog 0-20 units TID with meals, Novolog 0-5 units QHS, Novolog 6 units TID with meals   Inpatient Diabetes Program Recommendations:     Insulin: Please consider increasing meal coverage to Novolog 8 units TID with meals and Semglee to 55 units daily. If agreeable, please also order one time Semglee 5 units x1 now (since patient already given Semglee 50 units this morning).  Thanks, Barnie Alderman, RN, MSN, [facility] Diabetes Coordinator Inpatient Diabetes Program [phone] (Team Pager from 8am to [place])

## 2022-09-08 ENCOUNTER — Encounter
Admission: EM | Disposition: A | Discharge status: Home Health | Payer: Self-pay | Source: Home / Self Care | Attending: Internal Medicine

## 2022-09-08 DIAGNOSIS — I10 Essential (primary) hypertension: Secondary | ICD-10-CM

## 2022-09-08 DIAGNOSIS — Z9889 Other specified postprocedural states: Secondary | ICD-10-CM

## 2022-09-08 DIAGNOSIS — I739 Peripheral vascular disease, unspecified: Secondary | ICD-10-CM | POA: Diagnosis not present

## 2022-09-08 DIAGNOSIS — E0865 Diabetes mellitus due to underlying condition with hyperglycemia: Secondary | ICD-10-CM | POA: Diagnosis not present

## 2022-09-08 DIAGNOSIS — I70223 Atherosclerosis of native arteries of extremities with rest pain, bilateral legs: Secondary | ICD-10-CM

## 2022-09-08 DIAGNOSIS — I70221 Atherosclerosis of native arteries of extremities with rest pain, right leg: Secondary | ICD-10-CM

## 2022-09-08 HISTORY — PX: LOWER EXTREMITY ANGIOGRAPHY: CATH118251

## 2022-09-08 LAB — CBC
HCT: 37.9 % (ref 36.0–46.0)
Hemoglobin: 13 g/dL (ref 12.0–15.0)
MCH: 31.9 pg (ref 26.0–34.0)
MCHC: 34.3 g/dL (ref 30.0–36.0)
MCV: 93.1 fL (ref 80.0–100.0)
Platelets: 420 10*3/uL — ABNORMAL HIGH (ref 150–400)
RBC: 4.07 MIL/uL (ref 3.87–5.11)
RDW: 12.9 % (ref 11.5–15.5)
WBC: 7.4 10*3/uL (ref 4.0–10.5)
nRBC: 0 % (ref 0.0–0.2)

## 2022-09-08 LAB — BASIC METABOLIC PANEL
Anion gap: 8 (ref 5–15)
BUN: 11 mg/dL (ref 8–23)
CO2: 27 mmol/L (ref 22–32)
Calcium: 9.1 mg/dL (ref 8.9–10.3)
Chloride: 95 mmol/L — ABNORMAL LOW (ref 98–111)
Creatinine, Ser: 0.74 mg/dL (ref 0.44–1.00)
GFR, Estimated: >60 mL/min (ref >60)
Glucose, Bld: 258 mg/dL — ABNORMAL HIGH (ref 70–99)
Potassium: 4 mmol/L (ref 3.5–5.1)
Sodium: 130 mmol/L — ABNORMAL LOW (ref 135–145)

## 2022-09-08 LAB — GLUCOSE, CAPILLARY
Glucose-Capillary: 255 mg/dL — ABNORMAL HIGH (ref 70–99)
Glucose-Capillary: 192 mg/dL — ABNORMAL HIGH (ref 70–99)
Glucose-Capillary: 187 mg/dL — ABNORMAL HIGH (ref 70–99)
Glucose-Capillary: 196 mg/dL — ABNORMAL HIGH (ref 70–99)
Glucose-Capillary: 262 mg/dL — ABNORMAL HIGH (ref 70–99)

## 2022-09-08 SURGERY — LOWER EXTREMITY ANGIOGRAPHY
Anesthesia: Moderate Sedation | Laterality: Right

## 2022-09-08 MED ORDER — INSULIN GLARGINE-YFGN 100 UNIT/ML ~~LOC~~ SOLN
55.0000 [IU] | Freq: Every day | SUBCUTANEOUS | Status: DC
  Administered 2022-09-08 – 2022-09-09 (×2): 55 [IU] via SUBCUTANEOUS
  Filled 2022-09-08 (×2): qty 0.55

## 2022-09-08 MED ORDER — FENTANYL CITRATE (PF) 100 MCG/2ML IJ SOLN
INTRAMUSCULAR | Status: AC
  Filled 2022-09-08: qty 2

## 2022-09-08 MED ORDER — IODIXANOL 320 MG/ML IV SOLN
INTRAVENOUS | Status: DC | PRN
  Administered 2022-09-08: 75 mL

## 2022-09-08 MED ORDER — INSULIN DETEMIR 100 UNIT/ML ~~LOC~~ SOLN
15.0000 [IU] | Freq: Once | SUBCUTANEOUS | Status: DC
  Filled 2022-09-08: qty 0.15

## 2022-09-08 MED ORDER — HYDROMORPHONE HCL 1 MG/ML IJ SOLN: 1.0000 mg | Freq: Once | INTRAMUSCULAR | Status: DC | PRN

## 2022-09-08 MED ORDER — HEPARIN SODIUM (PORCINE) 1000 UNIT/ML IJ SOLN
INTRAMUSCULAR | Status: AC
  Filled 2022-09-08: qty 10

## 2022-09-08 MED ORDER — MIDAZOLAM HCL 2 MG/2ML IJ SOLN
INTRAMUSCULAR | Status: DC | PRN
  Administered 2022-09-08 (×3): 1 mg via INTRAVENOUS
  Administered 2022-09-08: 2 mg via INTRAVENOUS

## 2022-09-08 MED ORDER — ONDANSETRON HCL 4 MG/2ML IJ SOLN: 4.0000 mg | Freq: Four times a day (QID) | INTRAMUSCULAR | Status: DC | PRN

## 2022-09-08 MED ORDER — METHYLPREDNISOLONE SODIUM SUCC 125 MG IJ SOLR: 125.0000 mg | Freq: Once | INTRAMUSCULAR | Status: DC | PRN

## 2022-09-08 MED ORDER — CEFAZOLIN SODIUM-DEXTROSE 1-4 GM/50ML-% IV SOLN
INTRAVENOUS | Status: AC | PRN
  Administered 2022-09-08: 2 g via INTRAVENOUS

## 2022-09-08 MED ORDER — MIDAZOLAM HCL 5 MG/5ML IJ SOLN
INTRAMUSCULAR | Status: AC
  Filled 2022-09-08: qty 5

## 2022-09-08 MED ORDER — HEPARIN SODIUM (PORCINE) 1000 UNIT/ML IJ SOLN
INTRAMUSCULAR | Status: DC | PRN
  Administered 2022-09-08: 5000 [IU] via INTRAVENOUS

## 2022-09-08 MED ORDER — FAMOTIDINE 20 MG PO TABS: 40.0000 mg | ORAL_TABLET | Freq: Once | ORAL | Status: DC | PRN

## 2022-09-08 MED ORDER — CEFAZOLIN SODIUM-DEXTROSE 2-4 GM/100ML-% IV SOLN
INTRAVENOUS | Status: AC
  Filled 2022-09-08: qty 100

## 2022-09-08 MED ORDER — MIDAZOLAM HCL 2 MG/ML PO SYRP: 8.0000 mg | ORAL_SOLUTION | Freq: Once | ORAL | Status: DC | PRN

## 2022-09-08 MED ORDER — DIPHENHYDRAMINE HCL 50 MG/ML IJ SOLN: 50.0000 mg | Freq: Once | INTRAMUSCULAR | Status: DC | PRN

## 2022-09-08 MED ORDER — CEFAZOLIN SODIUM-DEXTROSE 2-4 GM/100ML-% IV SOLN: 2.0000 g | INTRAVENOUS | Status: DC

## 2022-09-08 MED ORDER — SODIUM CHLORIDE 0.9 % IV SOLN: INTRAVENOUS | Status: DC

## 2022-09-08 MED ORDER — FENTANYL CITRATE (PF) 100 MCG/2ML IJ SOLN
INTRAMUSCULAR | Status: DC | PRN
  Administered 2022-09-08 (×2): 25 ug via INTRAVENOUS
  Administered 2022-09-08: 50 ug via INTRAVENOUS

## 2022-09-08 SURGICAL SUPPLY — 26 items
BALLN LUTONIX 018 4X300X130 (BALLOONS) ×1
BALLN LUTONIX 018 4X80X130 (BALLOONS) ×1
BALLN LUTONIX 018 5X300X130 (BALLOONS) ×1
BALLOON LUTONIX 018 4X300X130 (BALLOONS) IMPLANT
BALLOON LUTONIX 018 4X80X130 (BALLOONS) IMPLANT
BALLOON LUTONIX 018 5X300X130 (BALLOONS) IMPLANT
CANNULA 5F STIFF (CANNULA) IMPLANT
CATH ANGIO 5F PIGTAIL 65CM (CATHETERS) IMPLANT
CATH NAVICROSS ANGLED 90CM (MICROCATHETER) IMPLANT
CATH VERT 5X100 (CATHETERS) IMPLANT
COVER PROBE ULTRASOUND 5X96 (MISCELLANEOUS) IMPLANT
DEVICE STARCLOSE SE CLOSURE (Vascular Products) IMPLANT
DEVICE TORQUE .025-.038 (MISCELLANEOUS) IMPLANT
GLIDEWIRE ADV .035X260CM (WIRE) IMPLANT
GUIDEWIRE PFTE-COATED .018X300 (WIRE) IMPLANT
KIT ENCORE 26 ADVANTAGE (KITS) IMPLANT
PACK ANGIOGRAPHY (CUSTOM PROCEDURE TRAY) ×1 IMPLANT
PANNUS RETENTION SYSTEM 2 PAD (MISCELLANEOUS) IMPLANT
SHEATH ANL2 6FRX45 HC (SHEATH) IMPLANT
SHEATH BRITE TIP 5FRX11 (SHEATH) IMPLANT
STENT VIABAHN 6X100X120 (Permanent Stent) IMPLANT
STENT VIABAHN 6X250X120 (Permanent Stent) IMPLANT
SYR MEDRAD MARK 7 150ML (SYRINGE) IMPLANT
TUBING CONTRAST HIGH PRESS 72 (TUBING) IMPLANT
WIRE G V18X300CM (WIRE) IMPLANT
WIRE GUIDERIGHT .035X150 (WIRE) IMPLANT

## 2022-09-08 NOTE — Op Note (Signed)
[facility]  Percutaneous Study/Intervention Procedural Note   Date of Surgery: 09/08/2022  Surgeon(s):Ruddy Swire    Assistants:none  Pre-operative Diagnosis: PAD with rest pain RLE  Post-operative diagnosis:  Same  Procedure(s) Performed:             1.  Ultrasound guidance for vascular access left femoral artery             2.  Catheter placement into right common femoral artery from left femoral approach             3.  Selective right lower extremity angiogram             4.  Percutaneous transluminal angioplasty of right SFA and proximal popliteal artery with 4 mm diameter by 30 cm length Lutonix drug-coated angioplasty balloon             5.  Viabahn stent placement x2 to the right SFA and proximal popliteal artery with 6 mm diameter by 25 cm length and 6 mm diameter by 10 cm length Viabahn stents  6.  StarClose closure device left femoral artery  EBL: 10 cc  Contrast: 75 cc  Fluoro Time: 24.9 minutes  Moderate Conscious Sedation Time: approximately 83 minutes using 5 mg of Versed and 100 mcg of Fentanyl              Indications:  Patient is a 66 y.o.female with rest pain of the right lower extremity with known severe peripheral arterial disease status post left lower extremity revascularization last week. The patient is brought in for angiography for further evaluation and potential treatment.  Due to the limb threatening nature of the situation, angiogram was performed for attempted limb salvage. The patient is aware that if the procedure fails, amputation would be expected.  The patient also understands that even with successful revascularization, amputation may still be required due to the severity of the situation.  Risks and benefits are discussed and informed consent is obtained.   Procedure:  The patient was identified and appropriate procedural time out was performed.  The patient was then placed supine on the table and prepped and draped in the  usual sterile fashion. Moderate conscious sedation was administered during a face to face encounter with the patient throughout the procedure with my supervision of the RN administering medicines and monitoring the patient's vital signs, pulse oximetry, telemetry and mental status throughout from the start of the procedure until the patient was taken to the recovery room. Ultrasound was used to evaluate the left common femoral artery.  It was patent .  A digital ultrasound image was acquired.  A Seldinger needle was used to access the left common femoral artery under direct ultrasound guidance and a permanent image was performed.  A 0.035 J wire was advanced without resistance and a 5Fr sheath was placed. I then crossed the aortic bifurcation and advanced to the right femoral head. Selective right lower extremity angiogram was then performed. This demonstrated a shelf of plaque in the mid to distal common femoral artery creating 50% or more stenosis.  The profunda femoris artery was patent.  The SFA was diffusely small and diseased throughout from the origin down to an occlusion in the mid to distal segment with reconstitution of the proximal popliteal artery.  The mid and distal popliteal artery within normal and there was three-vessel runoff distally. It was felt that it was in the patient's best interest to proceed with intervention after these images to  avoid a second procedure and a larger amount of contrast and fluoroscopy based off of the findings from the initial angiogram. The patient was systemically heparinized and a 6 [place] Ansell sheath was then placed over the [employer] wire. I then used a Kumpe catheter and the advantage wire to get down into the SFA and then exchanged for a Nava cross catheter and a 0.018 advantage wire eventually to cross the diffusely diseased SFA and popliteal artery and confirm intraluminal flow in the anterior tibial artery distally.  This was tedious and difficult to  cross due to the occlusion and calcific nature of the disease.  I then replaced a V18 wire and proceeded with treatment.  A 4 mm diameter by 30 cm length Lutonix drug-coated angioplasty balloon was inflated in the SFA and then a 4 mm diameter by 8 cm length Lutonix drug-coated angioplasty balloon was inflated in the proximal popliteal artery.  These inflations were 10 to 12 atm for 1 minute.  The vessel was still diffusely diseased with multiple areas of 50% or greater stenosis so elected to stent these.  A 6 mm diameter by 25 cm length Viabahn stent was deployed from the proximal popliteal artery up to the proximal SFA.  A 6 mm diameter by 10 cm length Viabahn stent was then taken to within 1 cm of the origin of the SFA with about a 2 to 3 cm overlap between stents.  These were postdilated with 5 mm balloons with excellent angiographic completion result and less than 10% residual stenosis in the SFA and popliteal arteries although there did remain disease in the common femoral artery that was at least borderline hemodynamically significant. I elected to terminate the procedure. The sheath was removed and StarClose closure device was deployed in the left femoral artery with excellent hemostatic result. The patient was taken to the recovery room in stable condition having tolerated the procedure well.  Findings:                       Right Lower Extremity:  This demonstrated a shelf of plaque in the mid to distal common femoral artery creating 50% or more stenosis.  The profunda femoris artery was patent.  The SFA was diffusely small and diseased throughout from the origin down to an occlusion in the mid to distal segment with reconstitution of the proximal popliteal artery.  The mid and distal popliteal artery within normal and there was three-vessel runoff distally.   Disposition: Patient was taken to the recovery room in stable condition having tolerated the procedure well.  Complications: None  [name] 09/08/2022 2:56 PM   This note was created with Dragon Medical transcription system. Any errors in dictation are purely unintentional.

## 2022-09-08 NOTE — Progress Notes (Signed)
PROGRESS NOTE   HPI was taken from Dr. Linda Hedges: Kristie Olson is a 66 y/o with a history of DM, CAD, HTN and HLD, recurrent pancreatitis. Patient self-reports a lifelong smoking history with continued 1 PPD smoking.  Estimate 50+ pack year smoker history.She presents to the ED due to acute on chronic lower extremity pain.  She reports symptoms of claudication chronically.  Her left leg is worse than her right.  She reports increasing pain over the past 1 week and rest pain over the past few days.  No falls or injuries, fevers.  Reports chronic ulcerations to her lower extremities.    ED Course: 98.2  149/73  91  17. Patient with leg pain but no acute distress otherwise. Leg ulcers noted. Poor DP pulses. Lab - Bmet - n, CBC nl. CTA - advanced narrowing/occlusion bilateral superficial femoral aa with distal reconstitution and 3 vessel runoff. EDP consult vascular surgery who recommended starting Heparin infusion and have [facility] admit. VS will see later in the AM  As per Dr. 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 Pt is s/p angioplasty of left SFA & proximal popliteal artery & stent placement to left SFA on 09/03/22 as per vascular. Possible angioplasty on 09/08/22 but possibly after that date as per vasc surg. Will make pt NPO after midnight just in case. Of note, PT/OT evaluated pt and recommends SNF. Pt refuses SNF but is agreeable to home health.    Kristie Olson  DJM:[id number] DOB: 1956/09/07 DOA: 09/02/2022 [facility]CP: [facility]    Assessment & Plan:   Principal Problem:   Claudication of both lower extremities ([place]) Active Problems:   Hypertension   Diabetes mellitus with hyperglycemia ([place])   PAD (peripheral artery disease) ([place])  Assessment and Plan: PAD: presented w/ leg leg pain, claudication & chronic ulcerations. 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 S/p angioplasty of left SFA & proximal popliteal artery and stent placement to left SFA on 09/03/22 as per vasc surg.  - RLE angioplasty on 10/25 -Continue on plavix, statin   DM2: poorly controlled, HbA1c 8.2. Increased glargine, continue on SSI.  HTN: continue on lisinopril, HCTZ  Constipation: - miralax   Obesity: BMI 39.0. Complicates overall care & prognosis   Thrombocytosis  Depression: severity unknown. Continue on home dose of fluoxetine   DVT prophylaxis: lovenox  Code Status: full  Family Communication:  Disposition Plan: likely d/c home w/ [facility] 10/26  Level of care: Med-Surg  Status is: Inpatient Remains inpatient appropriate because: severity of illness, procedure and recovery today.   Consultants:  Vasc surg   Procedures:              1.  Ultrasound guidance for vascular access right femoral artery             2.  Catheter placement into left common femoral artery from right femoral approach             3.  Aortogram and selective left lower extremity angiogram             4.  Percutaneous transluminal angioplasty of left SFA and proximal popliteal artery with a pair 4 mm diameter Lutonix drug-coated angioplasty balloons             5.  Stent placement to the left SFA with  6 mm diameter by 25 cm length Viabahn stent             6.  StarClose closure device right femoral artery  Antimicrobials: na  Subjective: Pt states she is overall well feeling with pain medication and staying still. Continues to have significant pain with exertion in right foot. Endorses cold feet but it's improving. Anxiously awaiting her procedure today and is looking forward to going home soon.  Objective: Vitals:   09/07/22 1733 09/07/22 2025 09/08/22 0750 09/08/22 1149  BP: 137/65 136/70 127/67 (!) 140/69  Pulse: 93 92 92 75  Resp: '16 20 17 16  '$ Temp: 99 F (37.2 C) 99.3 F  (37.4 C) 98.2 F (36.8 C) (!) 97.5 F (36.4 C)  TempSrc: Oral   Oral  SpO2: 96% 95% 98% 96%  Weight:      Height:        Intake/Output Summary (Last 24 hours) at 09/08/2022 1433 Last data filed at 09/07/2022 2025 Gross per 24 hour  Intake --  Output 800 ml  Net -800 ml    Filed Weights   09/02/22 1724  Weight: 90.7 kg    Examination:  General exam: Appears calm & comfortable   Respiratory system: normal respiratory status Cardiovascular system: bilateral lower extremities warm with open wounds under clean, dry dressing  Central nervous system: alert and oriented. Moves all extremities  Psychiatry: judgement and insight appears at baseline. Normal mood and affect  Data Reviewed: I have personally reviewed following labs and imaging studies  CBC: Recent Labs  Lab 09/04/22 0546 09/05/22 0424 09/06/22 0514 09/07/22 0424 09/08/22 0447  WBC 9.3 11.2* 10.5 9.1 7.4  HGB 13.4 13.9 12.8 12.6 13.0  HCT 40.0 41.2 38.2 37.0 37.9  MCV 94.1 93.4 92.9 93.9 93.1  PLT 434* 419* 378 385 420*    Basic Metabolic Panel: Recent Labs  Lab 09/04/22 0546 09/05/22 0424 09/06/22 0514 09/07/22 0424 09/08/22 0447  NA 133* 133* 132* 132* 130*  K 4.2 4.3 4.3 4.4 4.0  CL 97* 94* 93* 94* 95*  CO2 '27 27 26 24 27  '$ GLUCOSE 327* 332* 313* 232* 258*  BUN '12 13 14 18 11  '$ CREATININE 0.90 0.77 0.87 0.84 0.74  CALCIUM 8.9 9.0 9.1 9.2 9.1    GFR: Estimated Creatinine Clearance: 67.6 mL/min (by C-G formula based on SCr of 0.74 mg/dL).  Coagulation Profile: Recent Labs  Lab 09/02/22 2322  INR 1.0     CBG: Recent Labs  Lab 09/07/22 1726 09/07/22 2200 09/08/22 0751 09/08/22 1127 09/08/22 1154  GLUCAP 220* 197* 255* 192* 187*    Radiology Studies: No results found.   Scheduled Meds:  [MAR Hold] amitriptyline  10 mg Oral QHS   [MAR Hold] aspirin EC  81 mg Oral Daily   [MAR Hold] atorvastatin  10 mg Oral Daily   [MAR Hold] clopidogrel  75 mg Oral Daily   [MAR Hold]  docusate sodium  200 mg Oral BID   fentaNYL       [MAR Hold] FLUoxetine  40 mg Oral Daily   heparin sodium (porcine)       [MAR Hold] lisinopril  20 mg Oral Daily   And   [MAR Hold] hydrochlorothiazide  25 mg Oral Daily   [MAR Hold] insulin aspart  0-20 Units Subcutaneous TID WC   [MAR Hold] insulin aspart  0-5 Units Subcutaneous QHS   [MAR Hold] insulin aspart  8 Units Subcutaneous TID WC   [MAR Hold] insulin detemir  15 Units Subcutaneous Once   [MAR Hold] insulin glargine-yfgn  55 Units Subcutaneous QHS   midazolam       [MAR Hold] pantoprazole  40 mg Oral Daily   [MAR Hold] polyethylene glycol  17 g Oral Daily   [MAR Hold] senna  1 tablet Oral BID   Continuous Infusions:  sodium chloride 75 mL/hr at 09/08/22 0979   ceFAZolin      ceFAZolin (ANCEF) IV      ceFAZolin (ANCEF) IV        LOS: 5 days    Time spent: 30 mins     Richarda Osmond, MD [facility] Pager [phone]  If 7PM-7AM, please contact night-coverage [email or web address] 09/08/2022, 2:33 PM

## 2022-09-09 ENCOUNTER — Encounter: Payer: Self-pay | Admitting: Vascular Surgery

## 2022-09-09 DIAGNOSIS — E0865 Diabetes mellitus due to underlying condition with hyperglycemia: Secondary | ICD-10-CM | POA: Diagnosis not present

## 2022-09-09 DIAGNOSIS — I739 Peripheral vascular disease, unspecified: Secondary | ICD-10-CM | POA: Diagnosis not present

## 2022-09-09 DIAGNOSIS — I70223 Atherosclerosis of native arteries of extremities with rest pain, bilateral legs: Secondary | ICD-10-CM | POA: Diagnosis not present

## 2022-09-09 DIAGNOSIS — I10 Essential (primary) hypertension: Secondary | ICD-10-CM | POA: Diagnosis not present

## 2022-09-09 DIAGNOSIS — Z0000 Encounter for general adult medical examination without abnormal findings: Secondary | ICD-10-CM

## 2022-09-09 LAB — BASIC METABOLIC PANEL
Anion gap: 8 (ref 5–15)
BUN: 11 mg/dL (ref 8–23)
CO2: 28 mmol/L (ref 22–32)
Calcium: 8.9 mg/dL (ref 8.9–10.3)
Chloride: 98 mmol/L (ref 98–111)
Creatinine, Ser: 0.69 mg/dL (ref 0.44–1.00)
GFR, Estimated: >60 mL/min (ref >60)
Glucose, Bld: 224 mg/dL — ABNORMAL HIGH (ref 70–99)
Potassium: 4.2 mmol/L (ref 3.5–5.1)
Sodium: 134 mmol/L — ABNORMAL LOW (ref 135–145)

## 2022-09-09 LAB — CBC
HCT: 36.2 % (ref 36.0–46.0)
Hemoglobin: 12.3 g/dL (ref 12.0–15.0)
MCH: 31.4 pg (ref 26.0–34.0)
MCHC: 34 g/dL (ref 30.0–36.0)
MCV: 92.3 fL (ref 80.0–100.0)
Platelets: 438 10*3/uL — ABNORMAL HIGH (ref 150–400)
RBC: 3.92 MIL/uL (ref 3.87–5.11)
RDW: 12.9 % (ref 11.5–15.5)
WBC: 9.2 10*3/uL (ref 4.0–10.5)
nRBC: 0 % (ref 0.0–0.2)

## 2022-09-09 LAB — GLUCOSE, CAPILLARY
Glucose-Capillary: 255 mg/dL — ABNORMAL HIGH (ref 70–99)
Glucose-Capillary: 205 mg/dL — ABNORMAL HIGH (ref 70–99)
Glucose-Capillary: 166 mg/dL — ABNORMAL HIGH (ref 70–99)
Glucose-Capillary: 125 mg/dL — ABNORMAL HIGH (ref 70–99)

## 2022-09-09 NOTE — Progress Notes (Signed)
[facility]  Daily Progress Note   Subjective  -   Patient notes that her legs feel better but there is still some soreness.  Patient currently tearful at the prospect of returning home amid some family issues  Objective Vitals:   09/09/22 0455 09/09/22 0802 09/09/22 1551 09/09/22 2001  BP: (!) 147/63 (!) 148/89 112/63 (!) 100/53  Pulse: 81 89 98 100  Resp:  '18 18 18  '$ Temp:  98.7 F (37.1 C) 98.8 F (37.1 C) 99 F (37.2 C)  TempSrc:  Oral Oral Oral  SpO2: 98% 97% 95% 94%  Weight:      Height:        Intake/Output Summary (Last 24 hours) at 09/09/2022 2246 Last data filed at 09/09/2022 1600 Gross per 24 hour  Intake 600 ml  Output 650 ml  Net -50 ml    PULM  CTAB CV  RRR VASC  plus DP pulse bilaterally  Laboratory CBC    Component Value Date/Time   WBC 9.2 09/09/2022 0531   HGB 12.3 09/09/2022 0531   HGB 14.7 07/14/2013 2143   HCT 36.2 09/09/2022 0531   HCT 42.5 07/14/2013 2143   PLT 438 (H) 09/09/2022 0531   PLT 361 07/14/2013 2143    BMET    Component Value Date/Time   NA 134 (L) 09/09/2022 0531   NA 132 (L) 07/14/2013 2143   K 4.2 09/09/2022 0531   K 4.3 07/14/2013 2143   CL 98 09/09/2022 0531   CL 95 (L) 07/14/2013 2143   CO2 28 09/09/2022 0531   CO2 31 07/14/2013 2143   GLUCOSE 224 (H) 09/09/2022 0531   GLUCOSE 433 (H) 07/14/2013 2143   BUN 11 09/09/2022 0531   BUN 12 07/14/2013 2143   CREATININE 0.69 09/09/2022 0531   CREATININE 0.91 07/14/2013 2143   CALCIUM 8.9 09/09/2022 0531   CALCIUM 9.3 07/14/2013 2143   GFRNONAA >60 09/09/2022 0531   GFRNONAA >60 07/14/2013 2143   GFRAA >60 07/01/2020 1636   GFRAA >60 07/14/2013 2143    Assessment/Planning: The patient is post right lower extremity angiogram on 09/08/2022 and left on 09/03/2022.  The patient still continues to have some soreness postprocedure but feet are warm with strongly palpable pulses bilaterally.  She continues to endorse having some weakness.   Physical therapy would help greatly in regaining the patient's muscle strength ongoing.  The patient should discharge on Plavix and aspirin.  From a vascular standpoint she is currently stable for discharge.  We should see the patient in 3 to 4 weeks with ABIs  Kris Hartmann  09/09/2022, 10:46 PM

## 2022-09-09 NOTE — Progress Notes (Signed)
Physical Therapy Treatment Patient Details Name: Kristie Olson MRN: [id number] DOB: 27-Jul-1956 Today's Date: 09/09/2022   History of Present Illness Pt is a 66 year old female admitted with L leg pain, claudication & chronic ulcerations. 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 S/p angioplasty of left SFA & proximal popliteal artery and stent placement to left SFA on 09/03/22 as per vasc surg, RLE angiogram was performed for attempted limb salvage 09/08/22.    PT Comments    Pt resting in bed upon PT arrival; pt received morphine for pain prior to therapy session.  Initially pt reporting B LE pain "through the roof" but still agreeable to PT session; B LE pain 5/10 end of session at rest.  During session pt SBA with bed mobility; CGA with transfers (increased effort/time to stand from bed using RW); and CGA to ambulate 5 feet with RW use (pt was attempting to walk to bathroom--increased effort/time to take steps; pt reported significant B LE pain and then dizziness limiting distance able to ambulate).  Pt assisted into sitting in recliner and purewick placed; BP 169/114 resting in recliner; nursing and MD updated on pt's status.  Will continue to focus on strengthening and progressive functional mobility per pt tolerance.   Recommendations for follow up therapy are one component of a multi-disciplinary discharge planning process, led by the attending physician.  Recommendations may be updated based on patient status, additional functional criteria and insurance authorization.  Follow Up Recommendations  Skilled nursing-short term rehab (<3 hours/day) Can patient physically be transported by private vehicle: Yes   Assistance Recommended at Discharge Frequent or constant Supervision/Assistance  Patient can return home with the following A little help with bathing/dressing/bathroom;Direct supervision/assist for  medications management;Direct supervision/assist for financial management;Assistance with cooking/housework;Assist for transportation;Help with stairs or ramp for entrance;A little help with walking and/or transfers   Equipment Recommendations  BSC/3in1;Rolling walker (2 wheels)    Recommendations for Other Services OT consult     Precautions / Restrictions Precautions Precautions: Fall Restrictions Weight Bearing Restrictions: No     Mobility  Bed Mobility Overal bed mobility: Needs Assistance Bed Mobility: Supine to Sit     Supine to sit: Supervision, HOB elevated     General bed mobility comments: increased effort/time to perform on own    Transfers Overall transfer level: Needs assistance Equipment used: Rolling walker (2 wheels) Transfers: Sit to/from Stand Sit to Stand: Min guard           General transfer comment: increased effort and time to stand from bed; vc's for UE placement but pt preferred to keep B UE's on walker for stand/sit    Ambulation/Gait Ambulation/Gait assistance: Min guard Gait Distance (Feet): 5 Feet Assistive device: Rolling walker (2 wheels)   Gait velocity: decreased     General Gait Details: increased effort/time to take steps; decreased B LE step length and foot clearance   Stairs             Wheelchair Mobility    Modified Rankin (Stroke Patients Only)       Balance Overall balance assessment: Needs assistance Sitting-balance support: No upper extremity supported, Feet supported Sitting balance-Leahy Scale: Good Sitting balance - Comments: steady sitting reaching within BOS   Standing balance support: Bilateral upper extremity supported, During functional activity Standing balance-Leahy Scale: Fair Standing balance comment: steady with standing activities with at least single UE support  Cognition Arousal/Alertness: Awake/alert Behavior During Therapy: WFL for tasks  assessed/performed Overall Cognitive Status: No family/caregiver present to determine baseline cognitive functioning Area of Impairment: Safety/judgement, Awareness, Problem solving                         Safety/Judgement: Decreased awareness of safety, Decreased awareness of deficits Awareness: Emergent Problem Solving: Requires verbal cues, Requires tactile cues          Exercises      General Comments General comments (skin integrity, edema, etc.): wounds on B LE's (dressings in place).  Nursing cleared pt for participation in physical therapy.  Pt agreeable to PT session.      Pertinent Vitals/Pain Pain Assessment Pain Assessment: 0-10 Pain Score: 5  Pain Location: B LE's Pain Descriptors / Indicators: Aching, Discomfort, Tender Pain Intervention(s): Limited activity within patient's tolerance, Monitored during session, Premedicated before session, Repositioned (RN notified of pt's pain) HR and O2 on room air stable and WFL throughout treatment session.    Home Living                          Prior Function            PT Goals (current goals can now be found in the care plan section) Acute Rehab PT Goals Patient Stated Goal: go home PT Goal Formulation: With patient Time For Goal Achievement: 09/19/22 Potential to Achieve Goals: Fair Progress towards PT goals: Progressing toward goals    Frequency    Min 2X/week      PT Plan Current plan remains appropriate    Co-evaluation              AM-PAC PT "6 Clicks" Mobility   Outcome Measure  Help needed turning from your back to your side while in a flat bed without using bedrails?: A Little Help needed moving from lying on your back to sitting on the side of a flat bed without using bedrails?: A Little Help needed moving to and from a bed to a chair (including a wheelchair)?: A Little Help needed standing up from a chair using your arms (e.g., wheelchair or bedside chair)?: A  Little Help needed to walk in hospital room?: A Little Help needed climbing 3-5 steps with a railing? : Total 6 Click Score: 16    End of Session Equipment Utilized During Treatment: Gait belt Activity Tolerance: Other (comment) (limited d/t dizziness during ambulation) Patient left: in chair;with call bell/phone within reach;with chair alarm set;Other (comment) (B LE's elevated on pillows with heels floating) Nurse Communication: Mobility status;Precautions;Other (comment) (pt's pain status) PT Visit Diagnosis: Unsteadiness on feet (R26.81);Muscle weakness (generalized) (M62.81);Difficulty in walking, not elsewhere classified (R26.2);Pain Pain - Right/Left:  (B) Pain - part of body: Leg     Time: 4920-1007 PT Time Calculation (min) (ACUTE ONLY): 26 min  Charges:  $Gait Training: 8-22 mins $Therapeutic Activity: 8-22 mins                     Ryu Cerreta, PT 09/09/22, 1:15 PM

## 2022-09-09 NOTE — Progress Notes (Signed)
PROGRESS NOTE Kristie Olson  OEV:[id number] DOB: [birth year] DOA: 09/02/2022 PCP: [facility]  Brief Summary: HPI was taken from Dr. Linda Hedges: Ms. Kristie Olson is a 66 y/o with a history of DM, CAD, HTN and HLD, recurrent pancreatitis. Patient self-reports a lifelong smoking history with continued 1 PPD smoking.  Estimate 50+ pack year smoker history.She presents to the ED due to acute on chronic lower extremity pain.  She reports symptoms of claudication chronically.  Her left leg is worse than her right.  She reports increasing pain over the past 1 week and rest pain over the past few days.  No falls or injuries, fevers.  Reports chronic ulcerations to her lower extremities.    ED Course: 98.2  149/73  91  17. Patient with leg pain but no acute distress otherwise. Leg ulcers noted. Poor DP pulses. Lab - Bmet - n, CBC nl. CTA - advanced narrowing/occlusion bilateral superficial femoral aa with distal reconstitution and 3 vessel runoff. EDP consult vascular surgery who recommended starting Heparin infusion and have [facility] admit. VS will see later in the AM  As per Dr. 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 Pt is s/p angioplasty of left SFA & proximal popliteal artery & stent placement to left SFA on 09/03/22 as per vascular. Possible angioplasty on 09/08/22 but possibly after that date as per vasc surg. Will make pt NPO after midnight just in case. Of note, PT/OT evaluated pt and recommends SNF. Pt refuses SNF but is agreeable to home health.   Assessment & Plan:   Principal Problem:   Claudication of both lower extremities ([place]) Active Problems:   Hypertension   Diabetes mellitus with hyperglycemia ([place])   PAD (peripheral artery disease) (HCC)  Assessment and Plan: PAD: presented w/ leg leg pain, claudication & chronic ulcerations. 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 S/p angioplasty of left SFA & proximal popliteal artery and stent placement to left SFA on 09/03/22 as per vasc surg.  - RLE angioplasty on 10/25 -Continue on plavix, statin   DM2: poorly controlled, HbA1c 8.2. Increased glargine, continue on SSI.  HTN: continue on lisinopril, HCTZ  Constipation: - miralax   Obesity: BMI 39.0. Complicates overall care & prognosis   Thrombocytosis  Depression: severity unknown. Continue on home dose of fluoxetine   DVT prophylaxis: lovenox  Code Status: full  Family Communication:  Disposition Plan: likely d/c home w/ [facility] 10/26 or 10/27  Level of care: Med-Surg  Status is: Inpatient Remains inpatient appropriate because: severity of illness, procedure and recovery today.   Consultants:  Vasc surg   Procedures:              1.  Ultrasound guidance for vascular access right femoral artery             2.  Catheter placement into left common femoral artery from right femoral approach             3.  Aortogram and selective left lower extremity angiogram             4.  Percutaneous transluminal angioplasty of left SFA and proximal popliteal artery with a pair 4 mm diameter Lutonix drug-coated angioplasty balloons             5.  Stent placement to the left SFA with 6  mm diameter by 25 cm length Viabahn stent             6.  StarClose closure device right femoral artery  Antimicrobials: na  Subjective: Pt states she is upset that she needs to have a healthy diet and she refuses to eat it. She will not stay in the hospital if she has to have a healthy diet because it has nothing to do with her health.   Objective: Vitals:   09/08/22 1530 09/08/22 1544 09/08/22 1952 09/09/22 0455  BP: 133/63 139/70 (!) 150/72 (!) 147/63  Pulse: 92 91 (!) 105 81  Resp: '13 16 18   '$ Temp:  97.9 F (36.6 C) 99.5 F (37.5 C)   TempSrc:   Oral   SpO2:  97% 97% 99% 98%  Weight:      Height:        Intake/Output Summary (Last 24 hours) at 09/09/2022 0800 Last data filed at 09/09/2022 0500 Gross per 24 hour  Intake 581.91 ml  Output 350 ml  Net 231.91 ml    Filed Weights   09/02/22 1724  Weight: 90.7 kg    Examination: General exam: Appears calm & comfortable   Respiratory system: normal respiratory status Cardiovascular system: bilateral lower extremities warm with open wounds under clean, dry dressing  Central nervous system: alert and oriented. Moves all extremities  Psychiatry: judgement and insight appears at baseline. Normal mood and affect  Data Reviewed: I have personally reviewed following labs and imaging studies  CBC: Recent Labs  Lab 09/05/22 0424 09/06/22 0514 09/07/22 0424 09/08/22 0447 09/09/22 0531  WBC 11.2* 10.5 9.1 7.4 9.2  HGB 13.9 12.8 12.6 13.0 12.3  HCT 41.2 38.2 37.0 37.9 36.2  MCV 93.4 92.9 93.9 93.1 92.3  PLT 419* 378 385 420* 438*    Basic Metabolic Panel: Recent Labs  Lab 09/05/22 0424 09/06/22 0514 09/07/22 0424 09/08/22 0447 09/09/22 0531  NA 133* 132* 132* 130* 134*  K 4.3 4.3 4.4 4.0 4.2  CL 94* 93* 94* 95* 98  CO2 '27 26 24 27 28  '$ GLUCOSE 332* 313* 232* 258* 224*  BUN '13 14 18 11 11  '$ CREATININE 0.77 0.87 0.84 0.74 0.69  CALCIUM 9.0 9.1 9.2 9.1 8.9    GFR: Estimated Creatinine Clearance: 67.6 mL/min (by C-G formula based on SCr of 0.69 mg/dL).  Coagulation Profile: Recent Labs  Lab 09/02/22 2322  INR 1.0     CBG: Recent Labs  Lab 09/08/22 0751 09/08/22 1127 09/08/22 1154 09/08/22 1649 09/08/22 2037  GLUCAP 255* 192* 187* 196* 262*    Radiology Studies: PERIPHERAL VASCULAR CATHETERIZATION  Result Date: 09/08/2022 See surgical note for result.    Scheduled Meds:  amitriptyline  10 mg Oral QHS   aspirin EC  81 mg Oral Daily   atorvastatin  10 mg Oral Daily   clopidogrel  75 mg Oral Daily   FLUoxetine  40 mg Oral Daily   lisinopril  20 mg Oral  Daily   And   hydrochlorothiazide  25 mg Oral Daily   insulin aspart  0-20 Units Subcutaneous TID WC   insulin aspart  0-5 Units Subcutaneous QHS   insulin aspart  8 Units Subcutaneous TID WC   insulin detemir  15 Units Subcutaneous Once   insulin glargine-yfgn  55 Units Subcutaneous QHS   pantoprazole  40 mg Oral Daily   polyethylene glycol  17 g Oral Daily   senna  1 tablet Oral BID   Continuous Infusions:  LOS: 6 days    Time spent: 30 mins     Richarda Osmond, MD [facility] Pager [phone]  If 7PM-7AM, please contact night-coverage [email or web address] 09/09/2022, 8:00 AM

## 2022-09-09 NOTE — Progress Notes (Signed)
Occupational Therapy Treatment Patient Details Name: Kristie Olson MRN: [id number] DOB: 07-Mar-1956 Today's Date: 09/09/2022   History of present illness Pt is a 66 year old female admitted with L leg pain, claudication & chronic ulcerations. 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 S/p angioplasty of left SFA & proximal popliteal artery and stent placement to left SFA on 09/03/22 as per vasc surg, RLE angiogram was performed for attempted limb salvage.   OT comments  Chart reviewed, pt greeted in chair agreeable to OT tx session. Tx session targeted improvements in functional mobility in order to facilitate improved ADL completion. Pt able to amb in room with RW with CGA with frequent vcs, approx 8', limited by pt reported pain. Pt reports plan to still discharge home, is agreeable to [place]. Continue to recommend STR. OT will follow acutely.    Recommendations for follow up therapy are one component of a multi-disciplinary discharge planning process, led by the attending physician.  Recommendations may be updated based on patient status, additional functional criteria and insurance authorization.    Follow Up Recommendations  Skilled nursing-short term rehab (<3 hours/day)    Assistance Recommended at Discharge Frequent or constant Supervision/Assistance  Patient can return home with the following  A lot of help with walking and/or transfers;A lot of help with bathing/dressing/bathroom   Equipment Recommendations  Wheelchair (measurements OT)    Recommendations for Other Services      Precautions / Restrictions Precautions Precautions: Fall Restrictions Weight Bearing Restrictions: No       Mobility Bed Mobility               General bed mobility comments: NT in recliner pre/post session    Transfers Overall transfer level: Needs assistance Equipment used: Rolling walker (2  wheels) Transfers: Sit to/from Stand Sit to Stand: Min guard                 Balance Overall balance assessment: Needs assistance Sitting-balance support: No upper extremity supported, Feet supported Sitting balance-Leahy Scale: Good     Standing balance support: Bilateral upper extremity supported, During functional activity Standing balance-Leahy Scale: Fair                             ADL either performed or assessed with clinical judgement   ADL Overall ADL's : Needs assistance/impaired                     Lower Body Dressing: Maximal assistance   Toilet Transfer: Minimal assistance;Ambulation;Rolling walker (2 wheels) Toilet Transfer Details (indicate cue type and reason): simulated         Functional mobility during ADLs: Rolling walker (2 wheels);Min guard (approx 8' with RW)      Extremity/Trunk Assessment              Vision       Perception     Praxis      Cognition Arousal/Alertness: Awake/alert Behavior During Therapy: WFL for tasks assessed/performed Overall Cognitive Status: No family/caregiver present to determine baseline cognitive functioning Area of Impairment: Safety/judgement, Awareness, Problem solving                         Safety/Judgement: Decreased awareness of safety, Decreased awareness of deficits Awareness: Emergent Problem Solving: Requires verbal cues, Requires tactile cues  Exercises      Shoulder Instructions       General Comments      Pertinent Vitals/ Pain       Pain Assessment Pain Assessment: Faces Faces Pain Scale: Hurts little more Pain Location: BLEs Pain Descriptors / Indicators: Aching, Discomfort, Tender Pain Intervention(s): Limited activity within patient's tolerance, Monitored during session, Premedicated before session  Home Living                                          Prior Functioning/Environment               Frequency  Min 2X/week        Progress Toward Goals  OT Goals(current goals can now be found in the care plan section)  Progress towards OT goals: Progressing toward goals     Plan Discharge plan remains appropriate    Co-evaluation                 AM-PAC OT "6 Clicks" Daily Activity     Outcome Measure   Help from another person eating meals?: None Help from another person taking care of personal grooming?: None Help from another person toileting, which includes using toliet, bedpan, or urinal?: A Lot Help from another person bathing (including washing, rinsing, drying)?: A Lot Help from another person to put on and taking off regular upper body clothing?: A Little Help from another person to put on and taking off regular lower body clothing?: A Lot 6 Click Score: 17    End of Session Equipment Utilized During Treatment: Rolling walker (2 wheels)  OT Visit Diagnosis: Unsteadiness on feet (R26.81);Muscle weakness (generalized) (M62.81)   Activity Tolerance Patient tolerated treatment well   Patient Left in chair;with call bell/phone within reach;with chair alarm set   Nurse Communication Mobility status        Time: 4481-8563 OT Time Calculation (min): 9 min  Charges: OT General Charges $OT Visit: 1 Visit OT Treatments $Therapeutic Activity: 8-22 mins Shanon Payor, OTD OTR/L  09/09/22, 12:44 PM

## 2022-09-10 LAB — GLUCOSE, CAPILLARY
Glucose-Capillary: 228 mg/dL — ABNORMAL HIGH (ref 70–99)
Glucose-Capillary: 244 mg/dL — ABNORMAL HIGH (ref 70–99)

## 2022-09-10 MED ORDER — CLOPIDOGREL BISULFATE 75 MG PO TABS: 75.0000 mg | ORAL_TABLET | Freq: Every day | ORAL | 0 refills | Status: AC

## 2022-09-10 MED ORDER — ATORVASTATIN CALCIUM 10 MG PO TABS: 10.0000 mg | ORAL_TABLET | Freq: Every day | ORAL | 0 refills | Status: AC

## 2022-09-10 MED ORDER — ASPIRIN 81 MG PO TBEC: 81.0000 mg | DELAYED_RELEASE_TABLET | Freq: Every day | ORAL | 0 refills | Status: AC

## 2022-09-10 MED ORDER — INSULIN GLARGINE-YFGN 100 UNIT/ML ~~LOC~~ SOLN
60.0000 [IU] | Freq: Every day | SUBCUTANEOUS | Status: DC
  Filled 2022-09-10: qty 0.6

## 2022-09-10 MED ORDER — ACETAMINOPHEN 325 MG PO TABS: 650.0000 mg | ORAL_TABLET | Freq: Four times a day (QID) | ORAL | Status: AC | PRN

## 2022-09-10 NOTE — Care Management Important Message (Signed)
Important Message  Patient Details  Name: Kristie Olson MRN: [id number] Date of Birth: 19-Feb-1956   Medicare Important Message Given:  Yes     Dannette Barbara 09/10/2022, 12:35 PM

## 2022-09-10 NOTE — TOC Transition Note (Signed)
Transition of Care [facility]) - CM/SW Discharge Note   Patient Details  Name: Kristie Olson MRN: [id number] Date of Birth: 28-Jan-1956  [facility] CM/SW Contact:  Beverly Sessions, RN Phone Number: 09/10/2022, 2:27 PM   Clinical Narrative:     Patient to discharge today Kristie Olson with [facility] notified of discharge   Final next level of care: [place] Barriers to Discharge: Continued Medical Work up   Patient Goals and CMS Choice   CMS Medicare.gov Compare Post Acute Care list provided to:: Patient    Discharge Placement                       Discharge Plan and Services     Post Acute Care Choice: Home Health                               Social Determinants of Health (SDOH) Interventions     Readmission Risk Interventions     No data to display

## 2022-09-10 NOTE — Discharge Summary (Signed)
Physician Discharge Summary  Patient: Kristie Olson FVC:[id number] DOB: [birth year]   Code Status: Full Code Admit date: 09/02/2022 Discharge date: 09/10/2022 Disposition: Home health, PT and OT PCP: [facility], [facility]  Recommendations for Outpatient Follow-up:  Follow up with PCP within 1-2 weeks Very poor control of diabetes while inpatient. Recommend further evaluation, discussion, education Follow up with vascular surgery 3 weeks  Discharge Diagnoses:  Principal Problem:   Claudication of both lower extremities ([place]) Active Problems:   Hypertension   Diabetes mellitus with hyperglycemia ([place])   PAD (peripheral artery disease) [facility])  Brief Hospital Course Summary: Kristie Olson is a 66 y/o with a history of DM, CAD, HTN and HLD, recurrent pancreatitis. Patient self-reports a lifelong smoking history with continued 1 PPD smoking.  Estimate 50+ pack year smoker history.She presents to the ED due to acute on chronic lower extremity pain.  She reports symptoms of claudication chronically.  Her left leg is worse than her right.  She reports increasing pain over the past 1 week and rest pain over the past few days.  No falls or injuries, fevers.  Reports chronic ulcerations to her lower extremities.    ED Course: 98.2  149/73  91  17. Patient with leg pain but no acute distress otherwise. Leg ulcers noted. Poor DP pulses. Lab - Bmet - n, CBC nl. CTA - advanced narrowing/occlusion bilateral superficial femoral aa with distal reconstitution and 3 vessel runoff. Vascular surgery was consulted and recommended heparin infusion followed by intervention.    S/p angioplasty of left SFA & proximal popliteal artery & stent placement to left SFA on 09/03/22, and Right leg 10/25. She tolerated the procedures well and noticed improvement in pain and exertion tolerance post-op. PT/OT evaluated and recommended SNF but patient declined.  She was discharged home with family and [facility] services.    Discharge Condition: Stable, improved Recommended discharge diet: Cardiac diet  Consultations: Vascular surgery  Procedures/Studies: Bilateral lower extremity angioplasty as listed above.   Allergies as of 09/10/2022       Reactions   Ibuprofen Other (See Comments)   STOMACHACHE   Nsaids    Other reaction(s): Other (See Comments) PUD   Percocet [oxycodone-acetaminophen] Itching   Patient states "Percocet makes me itch all over"        Medication List     TAKE these medications    acetaminophen 325 MG tablet Commonly known as: TYLENOL Take 2 tablets (650 mg total) by mouth every 6 (six) hours as needed for mild pain (or Fever >/= 101).   albuterol 108 (90 Base) MCG/ACT inhaler Commonly known as: VENTOLIN HFA Inhale 2 puffs into the lungs every 4 (four) hours as needed for wheezing or shortness of breath.   amitriptyline 100 MG tablet Commonly known as: ELAVIL Take 10 mg by mouth at bedtime as needed for sleep.   aspirin EC 81 MG tablet Take 1 tablet (81 mg total) by mouth daily. Swallow whole. Start taking on: September 11, 2022   atorvastatin 10 MG tablet Commonly known as: LIPITOR Take 1 tablet (10 mg total) by mouth daily.   clopidogrel 75 MG tablet Commonly known as: PLAVIX Take 1 tablet (75 mg total) by mouth daily. Start taking on: September 11, 2022   esomeprazole 20 MG capsule Commonly known as: NEXIUM Take 20 mg by mouth daily at 12 noon.   FLUoxetine 40 MG capsule Commonly known as: PROZAC Take 1 capsule by mouth daily.   glipiZIDE 10 MG tablet  Commonly known as: GLUCOTROL Take 10 mg by mouth daily as needed.   insulin NPH-regular Human (70-30) 100 UNIT/ML injection Inject 25-70 Units into the skin in the morning and at bedtime. 70 qam 25 qhs   lisinopril-hydrochlorothiazide 20-25 MG tablet Commonly known as: ZESTORETIC Take 1 tablet by mouth daily.   Ozempic (0.25 or 0.5 MG/DOSE) 2 MG/1.5ML Sopn Generic drug: Semaglutide(0.25 or  0.'5MG'$ /DOS) Inject 1.5 mLs into the skin daily.               Durable Medical Equipment  (From admission, onward)           Start     Ordered   09/10/22 1410  For home use only DME Bedside commode  Once       Question:  Patient needs a bedside commode to treat with the following condition  Answer:  Weakness   09/10/22 1409   09/10/22 1409  For home use only DME Walker rolling  Once       Question Answer Comment  Walker: With [place]   Patient needs a walker to treat with the following condition Weakness      09/10/22 1409            Follow-up Information     Kris Hartmann, NP Follow up in 3 week(s).   Specialty: Vascular Surgery Why: With [facility]Is left a message for them to call the patient back Contact information: [address]  [place] [address] [phone]                 Subjective   Pt reports feeling improved. Denies pain in LE at rest. She declines recommendations for SNF. She states her husband can help her at home.  Objective  Blood pressure (!) 110/57, pulse 96, temperature 98.7 F (37.1 C), temperature source Oral, resp. rate 18, height 5' (1.524 m), weight 90.7 kg, SpO2 97 %.   General: Pt is alert, awake, not in acute distress Cardiovascular: RRR, S1/S2 +, no rubs, no gallops Respiratory: CTA bilaterally, no wheezing, no rhonchi Abdominal: Soft, NT, ND, bowel sounds + Extremities: mild edema bilaterally. Both extremities warm to touch. Sensation and motor intact. Bandages over wounds clean and dry   The results of significant diagnostics from this hospitalization (including imaging, microbiology, ancillary and laboratory) are listed below for reference.   Imaging studies: PERIPHERAL VASCULAR CATHETERIZATION  Result Date: 09/08/2022 See surgical note for result.  PERIPHERAL VASCULAR CATHETERIZATION  Result Date: 09/03/2022 See surgical note for result.  CT Angio Aortobifemoral W and/or Wo Contrast  Result Date:  09/02/2022 CLINICAL DATA:  Bilateral lower extremity wounds, discoloration, pain EXAM: CT ANGIOGRAPHY OF ABDOMINAL AORTA WITH ILIOFEMORAL RUNOFF TECHNIQUE: Multidetector CT imaging of the abdomen, pelvis and lower extremities was performed using the standard protocol during bolus administration of intravenous contrast. Multiplanar CT image reconstructions and MIPs were obtained to evaluate the vascular anatomy. RADIATION DOSE REDUCTION: This exam was performed according to the departmental dose-optimization program which includes automated exposure control, adjustment of the mA and/or kV according to patient size and/or use of iterative reconstruction technique. CONTRAST:  144m OMNIPAQUE IOHEXOL 350 MG/ML SOLN COMPARISON:  None Available. FINDINGS: VASCULAR Aorta: Normal caliber aorta without aneurysm, dissection, vasculitis or significant stenosis. Calcified athero sclerotic plaque. Celiac: Patent without evidence of aneurysm, dissection, vasculitis or significant stenosis. SMA: Patent without evidence of aneurysm, dissection, vasculitis or significant stenosis. Renals: Both renal arteries are patent without evidence of aneurysm, dissection, vasculitis, fibromuscular dysplasia or significant stenosis.  IMA: Patent without evidence of aneurysm, dissection, vasculitis or significant stenosis. RIGHT Lower Extremity Inflow: Calcified plaque causes mild-moderate stenosis of the proximal common iliac artery. Moderate-severe narrowing of the right internal iliac artery. No aneurysm or dissection. Outflow: Calcified plaque causes up to moderate narrowing of the common femoral artery and advanced narrowing of the superficial femoral artery which appears occluded distally. Reconstitution of the distal superficial femoral artery and popliteal artery. Runoff: Patent three vessel runoff to the ankle. LEFT Lower Extremity Inflow: Calcified plaque causes mild-moderate stenosis of the proximal common iliac artery. Moderate-severe  narrowing of the right internal iliac artery. No aneurysm or dissection. Outflow: Calcified plaque causes up to moderate narrowing of the common femoral artery and advanced narrowing of the superficial femoral artery which appears occluded distally. Reconstitution of the distal superficial femoral artery and popliteal artery. Runoff: Patent three vessel runoff to the ankle. Veins: No obvious venous abnormality within the limitations of this arterial phase study. Review of the MIP images confirms the above findings. NON-VASCULAR Lower chest: Coronary artery calcification. Normal heart size. No pericardial effusion. Hepatobiliary: No focal liver abnormality is seen. Status post cholecystectomy. No biliary dilatation. Pancreas: Unremarkable. No pancreatic ductal dilatation or surrounding inflammatory changes. Spleen: Normal in size without focal abnormality. Adrenals/Urinary Tract: Unchanged nodules in both adrenal glands likely representing adenomas. Kidneys are normal, without renal calculi, focal lesion, or hydronephrosis. Low-attenuation lesions in the kidneys are statistically likely to represent cysts. No follow-up is required. Bladder is unremarkable. Stomach/Bowel: Stomach is within normal limits. No evidence of bowel wall thickening, distention, or inflammatory changes. Lymphatic: No significant adenopathy. Reproductive: Unremarkable. Other: No free intraperitoneal fluid or air. Musculoskeletal: No acute osseous abnormality. Foci of soft tissue thickening and adjacent stranding in the anterior abdominal fat about the lower abdomen bilaterally (series 4/image 116). IMPRESSION: VASCULAR Advanced narrowing and likely occlusion of the bilateral superficial femoral arteries with distal reconstitution of the superior femoral/popliteal arteries. Patent three-vessel runoff of both lower extremities. Coronary artery and Aortic Atherosclerosis (ICD10-I70.0). NON-VASCULAR No acute abnormality in the abdomen or pelvis.  Foci of soft tissue thickening and adjacent stranding in the lower anterior abdominal wall fat bilaterally. Correlate for panniculitis. Electronically Signed   By: Placido Sou M.D.   On: 09/02/2022 22:09   DG Tibia/Fibula Left  Result Date: 09/02/2022 CLINICAL DATA:  Wound in the distal left lower extremity. EXAM: LEFT TIBIA AND FIBULA - 2 VIEW COMPARISON:  None Available. FINDINGS: No acute fracture or dislocation. Old appearing fracture of the proximal tibia extending from the tibial spine into the proximal tibial diaphysis. The bones are mildly osteopenic. No significant joint effusion. No periosteal elevation or bone erosion to suggest acute osteomyelitis. Mild soft tissue thickening over the anterior ankle. Faint focus of low attenuation in the skin anterior to the distal tibia likely corresponds to the wound. No radiopaque foreign object. IMPRESSION: 1. No acute fracture or dislocation. 2. No radiographic evidence of acute osteomyelitis. 3. Old appearing fracture of the proximal tibia. Electronically Signed   By: Anner Crete M.D.   On: 09/02/2022 18:06    Labs: Basic Metabolic Panel: Recent Labs  Lab 09/05/22 0424 09/06/22 0514 09/07/22 0424 09/08/22 0447 09/09/22 0531  NA 133* 132* 132* 130* 134*  K 4.3 4.3 4.4 4.0 4.2  CL 94* 93* 94* 95* 98  CO2 '27 26 24 27 28  '$ GLUCOSE 332* 313* 232* 258* 224*  BUN '13 14 18 11 11  '$ CREATININE 0.77 0.87 0.84 0.74 0.69  CALCIUM 9.0 9.1  9.2 9.1 8.9   CBC: Recent Labs  Lab 09/05/22 0424 09/06/22 0514 09/07/22 0424 09/08/22 0447 09/09/22 0531  WBC 11.2* 10.5 9.1 7.4 9.2  HGB 13.9 12.8 12.6 13.0 12.3  HCT 41.2 38.2 37.0 37.9 36.2  MCV 93.4 92.9 93.9 93.1 92.3  PLT 419* 378 385 420* 438*   Microbiology: None   Time coordinating discharge: Over 30 minutes  Richarda Osmond, MD  [facility] 09/10/2022, 5:50 PM

## 2022-09-10 NOTE — TOC Progression Note (Signed)
Transition of Care [facility]) - Progression Note    Patient Details  Name: Kristie Olson MRN: [id number] Date of Birth: [birth year]  Transition of Care [facility]) CM/SW Contact  Beverly Sessions, RN Phone Number: 09/10/2022, 2:12 PM  Clinical Narrative:     Referral made to [facility] with [employer] for RW and [facility] to be delivered to room  Expected Discharge Plan: [place] Barriers to Discharge: Continued Medical Work up  Expected Discharge Plan and Services Expected Discharge Plan: [place] Choice: [place] arrangements for the past 2 months: Single Family Home                                       Social Determinants of Health (SDOH) Interventions    Readmission Risk Interventions     No data to display

## 2022-09-10 NOTE — Discharge Instructions (Signed)
We recommended discharge to rehab facility. We understand that you decline that option so we will fully maximize the assistance programs for home therapies and equipment.  Please continue your medications as prescribed on discharge paperwork.  Please follow up with your vascular surgeon to monitor your leg healing. Follow up with your primary doctor within 2 weeks to discuss your diabetes management.

## 2022-09-10 NOTE — Progress Notes (Signed)
Physical Therapy Treatment Patient Details Name: Kristie Olson MRN: [id number] DOB: 12-28-1955 Today's Date: 09/10/2022   History of Present Illness Pt is a 66 year old female admitted with L leg pain, claudication & chronic ulcerations. 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 S/p angioplasty of left SFA & proximal popliteal artery and stent placement to left SFA on 09/03/22 as per vasc surg, RLE angiogram was performed for attempted limb salvage 09/08/22.    PT Comments    Pt resting in bed upon PT arrival; agreeable to PT session.  Pt modified independent semi-supine to sitting edge of bed; CGA with transfers using RW; and CGA to ambulate 8 feet and then (after sitting rest break) 12 feet with RW use (distance limited d/t B LE weakness and B LE pain per pt report).  Pt declined stairs trial d/t reporting 8-9/10 B LE pain end of session at rest (10/10 reported with walking)--nurse and MD notified regarding pt's pain and mobility.  Pt also reporting she will be able to perform stairs when she needs to at home and will have assist from her son. Pt declining SNF recommendation and requesting discharge home today.   Recommendations for follow up therapy are one component of a multi-disciplinary discharge planning process, led by the attending physician.  Recommendations may be updated based on patient status, additional functional criteria and insurance authorization.  Follow Up Recommendations  Skilled nursing-short term rehab (<3 hours/day) Can patient physically be transported by private vehicle: Yes   Assistance Recommended at Discharge Frequent or constant Supervision/Assistance  Patient can return home with the following A little help with bathing/dressing/bathroom;Direct supervision/assist for medications management;Direct supervision/assist for financial management;Assistance with cooking/housework;Assist  for transportation;Help with stairs or ramp for entrance;A little help with walking and/or transfers   Equipment Recommendations  BSC/3in1;Rolling walker (2 wheels)    Recommendations for Other Services OT consult     Precautions / Restrictions Precautions Precautions: Fall Restrictions Weight Bearing Restrictions: No     Mobility  Bed Mobility Overal bed mobility: Modified Independent Bed Mobility: Supine to Sit     Supine to sit: Modified independent (Device/Increase time), HOB elevated     General bed mobility comments: increased effort/time to perform on own    Transfers Overall transfer level: Needs assistance Equipment used: Rolling walker (2 wheels) Transfers: Sit to/from Stand Sit to Stand: Min guard           General transfer comment: x1 trial from bed and x1 trial from recliner (sit to stand); pt prefers to keep B UE's on walker for stand/sit; steady but requires increased effort to stand    Ambulation/Gait Ambulation/Gait assistance: Min guard Gait Distance (Feet):  (8 feet; 12 feet) Assistive device: Rolling walker (2 wheels)   Gait velocity: decreased     General Gait Details: increased effort/time to take steps; decreased B LE step length and foot clearance   Stairs Stairs:  (pt declined stairs trial d/t B LE pain)           Wheelchair Mobility    Modified Rankin (Stroke Patients Only)       Balance Overall balance assessment: Needs assistance Sitting-balance support: No upper extremity supported, Feet supported Sitting balance-Leahy Scale: Good Sitting balance - Comments: steady sitting reaching within BOS   Standing balance support: Bilateral upper extremity supported, During functional activity, Reliant on assistive device for balance Standing balance-Leahy Scale: Good Standing balance comment: steady ambulating with RW use  Cognition Arousal/Alertness: Awake/alert Behavior During  Therapy: WFL for tasks assessed/performed Overall Cognitive Status: No family/caregiver present to determine baseline cognitive functioning Area of Impairment: Safety/judgement, Awareness, Problem solving                         Safety/Judgement: Decreased awareness of safety, Decreased awareness of deficits Awareness: Emergent Problem Solving: Requires verbal cues, Requires tactile cues          Exercises      General Comments General comments (skin integrity, edema, etc.): wounds on B LE's (dressings in place)      Pertinent Vitals/Pain Pain Assessment Pain Assessment: 0-10 Pain Score: 9  Pain Location: B LE's Pain Descriptors / Indicators: Aching, Discomfort, Tender Pain Intervention(s): Limited activity within patient's tolerance, Monitored during session, Premedicated before session, Repositioned, Other (comment) (RN notified regarding pt's pain) Vitals (HR and O2 on room air) stable and WFL throughout treatment session.    Home Living                          Prior Function            PT Goals (current goals can now be found in the care plan section) Acute Rehab PT Goals Patient Stated Goal: go home PT Goal Formulation: With patient Time For Goal Achievement: 09/19/22 Potential to Achieve Goals: Fair Progress towards PT goals: Progressing toward goals    Frequency    Min 2X/week      PT Plan Current plan remains appropriate    Co-evaluation              AM-PAC PT "6 Clicks" Mobility   Outcome Measure  Help needed turning from your back to your side while in a flat bed without using bedrails?: A Little Help needed moving from lying on your back to sitting on the side of a flat bed without using bedrails?: A Little Help needed moving to and from a bed to a chair (including a wheelchair)?: A Little Help needed standing up from a chair using your arms (e.g., wheelchair or bedside chair)?: A Little Help needed to walk in  hospital room?: A Little Help needed climbing 3-5 steps with a railing? : Total 6 Click Score: 16    End of Session Equipment Utilized During Treatment: Gait belt Activity Tolerance: Patient limited by fatigue;Patient limited by pain Patient left: in chair;with call bell/phone within reach;with chair alarm set;Other (comment) (B heels floating via pillow support) Nurse Communication: Mobility status;Precautions;Other (comment) (pt's pain status) PT Visit Diagnosis: Unsteadiness on feet (R26.81);Muscle weakness (generalized) (M62.81);Difficulty in walking, not elsewhere classified (R26.2);Pain Pain - Right/Left:  (B) Pain - part of body: Leg     Time: 1336-1400 PT Time Calculation (min) (ACUTE ONLY): 24 min  Charges:  $Gait Training: 8-22 mins $Therapeutic Activity: 8-22 mins                     Leitha Bleak, PT 09/10/22, 5:29 PM

## 2022-09-10 NOTE — Inpatient Diabetes Management (Signed)
Inpatient Diabetes Program Recommendations  AACE/ADA: New Consensus Statement on Inpatient Glycemic Control   Target Ranges:  Prepandial:   less than 140 mg/dL      Peak postprandial:   less than 180 mg/dL (1-2 hours)      Critically ill patients:  140 - 180 mg/dL    Latest Reference Range & Units 09/10/22 07:39  Glucose-Capillary 70 - 99 mg/dL 228 (H)    Latest Reference Range & Units 09/09/22 08:04 09/09/22 12:32 09/09/22 16:05 09/09/22 20:38  Glucose-Capillary 70 - 99 mg/dL 255 (H) 205 (H) 166 (H) 125 (H)   Review of Glycemic Control  Diabetes history: DM2 Outpatient Diabetes medications: 70/30 70 units QAM, 70/30 25 units QPM,  Glipizide 10 mg daily, Ozempic 2 mg Qweek (only taken 1 time in past month) Current orders for Inpatient glycemic control: Semglee 55 units daily, Novolog 0-20 units TID with meals, Novolog 0-5 units QHS, Novolog 8 units TID with meals  Inpatient Diabetes Program Recommendations:    Insulin: Please consider increasing Semglee to 60 units QHS.  Thanks, Barnie Alderman, RN, MSN, [place] Diabetes Coordinator Inpatient Diabetes Program [phone] (Team Pager from 8am to [place])

## 2022-09-10 NOTE — Progress Notes (Signed)
Patient is not able to walk the distance required to go the bathroom, or he/she is unable to safely negotiate stairs required to access the bathroom.  A 3in1 BSC will alleviate this problem  

## 2022-09-10 NOTE — Progress Notes (Signed)
Patient discharge instructions reviewed, patient verbalized understanding, Patient discharged home with [facility], walker and BSC provided at discharge.

## 2022-10-04 ENCOUNTER — Other Ambulatory Visit (INDEPENDENT_AMBULATORY_CARE_PROVIDER_SITE_OTHER): Payer: Self-pay | Admitting: Vascular Surgery

## 2022-10-04 DIAGNOSIS — I739 Peripheral vascular disease, unspecified: Secondary | ICD-10-CM

## 2022-10-04 DIAGNOSIS — Z95820 Peripheral vascular angioplasty status with implants and grafts: Secondary | ICD-10-CM

## 2022-10-05 ENCOUNTER — Ambulatory Visit (INDEPENDENT_AMBULATORY_CARE_PROVIDER_SITE_OTHER): Payer: Medicare Other | Admitting: Nurse Practitioner

## 2022-10-05 ENCOUNTER — Ambulatory Visit (INDEPENDENT_AMBULATORY_CARE_PROVIDER_SITE_OTHER): Payer: 59

## 2022-10-05 DIAGNOSIS — R52 Pain, unspecified: Secondary | ICD-10-CM | POA: Diagnosis not present

## 2022-10-05 DIAGNOSIS — I10 Essential (primary) hypertension: Secondary | ICD-10-CM

## 2022-10-05 DIAGNOSIS — Z9889 Other specified postprocedural states: Secondary | ICD-10-CM

## 2022-10-05 DIAGNOSIS — I739 Peripheral vascular disease, unspecified: Secondary | ICD-10-CM

## 2022-10-05 DIAGNOSIS — Z95820 Peripheral vascular angioplasty status with implants and grafts: Secondary | ICD-10-CM

## 2022-10-05 DIAGNOSIS — G629 Polyneuropathy, unspecified: Secondary | ICD-10-CM

## 2022-10-09 ENCOUNTER — Encounter (INDEPENDENT_AMBULATORY_CARE_PROVIDER_SITE_OTHER): Payer: Self-pay | Admitting: Nurse Practitioner

## 2022-10-09 NOTE — Progress Notes (Signed)
Subjective:    Patient ID: Kristie Olson, female    DOB: 1956-08-28, 66 y.o.   MRN: [id number] Chief Complaint  Patient presents with   Follow-up    [facility] 3 week with ABI    The patient returns to the office for followup and review status post angiogram with intervention on 10/20 and 09/08/2022.   Procedure: 09/03/2022 Procedure(s) Performed:             1.  Ultrasound guidance for vascular access right femoral artery             2.  Catheter placement into left common femoral artery from right femoral approach             3.  Aortogram and selective left lower extremity angiogram             4.  Percutaneous transluminal angioplasty of left SFA and proximal popliteal artery with a pair 4 mm diameter Lutonix drug-coated angioplasty balloons             5.  Stent placement to the left SFA with 6 mm diameter by 25 cm length Viabahn stent             6.  StarClose closure device right femoral artery    09/08/2022 Procedure(s) Performed:             1.  Ultrasound guidance for vascular access left femoral artery             2.  Catheter placement into right common femoral artery from left femoral approach             3.  Selective right lower extremity angiogram             4.  Percutaneous transluminal angioplasty of right SFA and proximal popliteal artery with 4 mm diameter by 30 cm length Lutonix drug-coated angioplasty balloon             5.  Viabahn stent placement x2 to the right SFA and proximal popliteal artery with 6 mm diameter by 25 cm length and 6 mm diameter by 10 cm length Viabahn stents             6.  StarClose closure device left femoral artery   The patient notes improvement in the lower extremity symptoms. No new ulcers or wounds have occurred since the last visit.  However she does note that she has pain in her lower extremities with standing.  She notes that after she stands for a short amount of time her legs began to hurt and she has to sit.  She also notes  significant back pain during this time.  There have been no significant changes to the patient's overall health care.  No documented history of amaurosis fugax or recent TIA symptoms. There are no recent neurological changes noted. No documented history of DVT, PE or superficial thrombophlebitis. The patient denies recent episodes of angina or shortness of breath.   ABI's Rt=1.11 and Lt=1.07  (No previous ABI) Duplex US of the right lower extremity shows triphasic tibial artery waveforms with biphasic tibial artery waveforms in the left.  Normal toe waveforms bilaterally.    Review of Systems  Musculoskeletal:  Positive for arthralgias, back pain and gait problem.  All other systems reviewed and are negative.      Objective:   Physical Exam  There were no vitals taken for this visit.  Past Medical History:  Diagnosis Date  Asthma    Coronary artery disease    Diabetes mellitus without complication ([place]) 1062   Hypertension    PAD (peripheral artery disease) ([place]) 09/03/2022    Social History   Socioeconomic History   Marital status: Married    Spouse name: Not on file   Number of children: Not on file   Years of education: Not on file   Highest education level: Not on file  Occupational History   Not on file  Tobacco Use   Smoking status: Every Day    Packs/day: 0.75    Years: 46.00    Total pack years: 34.50    Types: Cigarettes   Smokeless tobacco: Never  Substance and Sexual Activity   Alcohol use: No   Drug use: Not on file   Sexual activity: Not on file  Other Topics Concern   Not on file  Social History Narrative   Not on file   Social Determinants of Health   Financial Resource Strain: Not on file  Food Insecurity: No Food Insecurity (09/03/2022)   Hunger Vital Sign    Worried About Running Out of Food in the Last Year: Never true    Ran Out of Food in the Last Year: Never true  Transportation Needs: No Transportation Needs (09/03/2022)    PRAPARE - Hydrologist (Medical): No    Lack of Transportation (Non-Medical): No  Physical Activity: Not on file  Stress: Not on file  Social Connections: Not on file  Intimate Partner Violence: Not At Risk (09/03/2022)   Humiliation, Afraid, Rape, and Kick questionnaire    Fear of Current or Ex-Partner: No    Emotionally Abused: No    Physically Abused: No    Sexually Abused: No    Past Surgical History:  Procedure Laterality Date   BREAST EXCISIONAL BIOPSY Left 1980's   neg   BREAST MASS EXCISION Left    age 23"s, benign   CESAREAN SECTION     CHOLECYSTECTOMY  2009   LOWER EXTREMITY ANGIOGRAPHY Left 09/03/2022   Procedure: Lower Extremity Angiography;  Surgeon: Algernon Huxley, MD;  Location: [facility];  Service: Cardiovascular;  Laterality: Left;   LOWER EXTREMITY ANGIOGRAPHY Right 09/08/2022   Procedure: Lower Extremity Angiography;  Surgeon: Algernon Huxley, MD;  Location: [facility];  Service: Cardiovascular;  Laterality: Right;    Family History  Problem Relation Age of Onset   Breast cancer Sister 38   Breast cancer Paternal Aunt     Allergies  Allergen Reactions   Ibuprofen Other (See Comments)    STOMACHACHE   Nsaids     Other reaction(s): Other (See Comments) PUD   Percocet [Oxycodone-Acetaminophen] Itching    Patient states "Percocet makes me itch all over"       Latest Ref Rng & Units 09/09/2022    5:31 AM 09/08/2022    4:47 AM 09/07/2022    4:24 AM  CBC  WBC 4.0 - 10.5 K/uL 9.2  7.4  9.1   Hemoglobin 12.0 - 15.0 g/dL 12.3  13.0  12.6   Hematocrit 36.0 - 46.0 % 36.2  37.9  37.0   Platelets 150 - 400 K/uL 438  420  385       CMP     Component Value Date/Time   NA 134 (L) 09/09/2022 0531   NA 132 (L) 07/14/2013 2143   K 4.2 09/09/2022 0531   K 4.3 07/14/2013 2143   CL 98  09/09/2022 0531   CL 95 (L) 07/14/2013 2143   CO2 28 09/09/2022 0531   CO2 31 07/14/2013 2143   GLUCOSE 224 (H) 09/09/2022  0531   GLUCOSE 433 (H) 07/14/2013 2143   BUN 11 09/09/2022 0531   BUN 12 07/14/2013 2143   CREATININE 0.69 09/09/2022 0531   CREATININE 0.91 07/14/2013 2143   CALCIUM 8.9 09/09/2022 0531   CALCIUM 9.3 07/14/2013 2143   PROT 8.2 (H) 04/28/2020 0423   PROT 7.9 07/14/2013 2143   ALBUMIN 4.3 04/28/2020 0423   ALBUMIN 3.7 07/14/2013 2143   AST 17 04/28/2020 0423   AST 16 07/14/2013 2143   ALT 14 04/28/2020 0423   ALT 20 07/14/2013 2143   ALKPHOS 89 04/28/2020 0423   ALKPHOS 118 07/14/2013 2143   BILITOT 0.6 04/28/2020 0423   BILITOT 0.3 07/14/2013 2143   GFRNONAA >60 09/09/2022 0531   GFRNONAA >60 07/14/2013 2143   GFRAA >60 07/01/2020 1636   GFRAA >60 07/14/2013 2143     No results found.     Assessment & Plan:   1. Peripheral arterial disease with history of revascularization ([place]) Recommend:  The patient is status post successful angiogram with intervention.  The patient reports that the claudication symptoms and leg pain has improved.   The patient denies lifestyle limiting changes at this point in time.  No further invasive studies, angiography or surgery at this time The patient should continue walking and begin a more formal exercise program.  The patient should continue antiplatelet therapy and aggressive treatment of the lipid abnormalities  Continued surveillance is indicated as atherosclerosis is likely to progress with time.    Patient should undergo noninvasive studies as ordered. The patient will follow up with me to review the studies.  - VAS [place] ABI WITH/WO TBI  2. Benign essential hypertension Continue antihypertensive medications as already ordered, these medications have been reviewed and there are no changes at this time.  3. Peripheral polyneuropathy I suspect this may also play a portion of the patient's ongoing lower extremity pain.  Patient to follow-up with primary care provider for further treatment and evaluation  4. Pain aggravated by  standing The patient's noninvasive studies show that her lower extremity perfusion is within normal limits following her revascularization.  She describes significant pain with standing and notes that the pain radiates from her back down her legs.  I suspect that the patient has issues with her lower back that has largely driving her pain.  We will send the patient to neurosurgery for further work-up and evaluation. - Ambulatory referral to Neurosurgery   Current Outpatient Medications on File Prior to Visit  Medication Sig Dispense Refill   JARDIANCE 25 MG TABS tablet Take 25 mg by mouth daily.     acetaminophen (TYLENOL) 325 MG tablet Take 2 tablets (650 mg total) by mouth every 6 (six) hours as needed for mild pain (or Fever >/= 101).     albuterol (PROVENTIL HFA;VENTOLIN HFA) 108 (90 Base) MCG/ACT inhaler Inhale 2 puffs into the lungs every 4 (four) hours as needed for wheezing or shortness of breath.     amitriptyline (ELAVIL) 100 MG tablet Take 10 mg by mouth at bedtime as needed for sleep.     aspirin EC 81 MG tablet Take 1 tablet (81 mg total) by mouth daily. Swallow whole. 30 tablet 0   atorvastatin (LIPITOR) 10 MG tablet Take 1 tablet (10 mg total) by mouth daily. 30 tablet 0   clopidogrel (PLAVIX) 75  MG tablet Take 1 tablet (75 mg total) by mouth daily. 30 tablet 0   esomeprazole (NEXIUM) 20 MG capsule Take 20 mg by mouth daily at 12 noon.     FLUoxetine (PROZAC) 40 MG capsule Take 1 capsule by mouth daily.     glipiZIDE (GLUCOTROL) 10 MG tablet Take 10 mg by mouth daily as needed. (Patient not taking: Reported on 10/05/2022)     insulin NPH-regular Human (70-30) 100 UNIT/ML injection Inject 25-70 Units into the skin in the morning and at bedtime. 70 qam 25 qhs     lisinopril-hydrochlorothiazide (ZESTORETIC) 20-25 MG tablet Take 1 tablet by mouth daily.     OZEMPIC, 0.25 OR 0.5 MG/DOSE, 2 MG/1.5ML SOPN Inject 1.5 mLs into the skin daily.     No current facility-administered  medications on file prior to visit.    There are no Patient Instructions on file for this visit. No follow-ups on file.   Kris Hartmann, NP

## 2022-10-20 ENCOUNTER — Ambulatory Visit
Admission: RE | Admit: 2022-10-20 | Discharge: 2022-10-20 | Disposition: A | Discharge status: Home / Self Care | Payer: Self-pay | Source: Ambulatory Visit | Attending: Orthopedic Surgery | Admitting: Orthopedic Surgery

## 2022-10-20 ENCOUNTER — Other Ambulatory Visit: Payer: Self-pay

## 2022-10-20 DIAGNOSIS — Z049 Encounter for examination and observation for unspecified reason: Secondary | ICD-10-CM

## 2022-11-03 NOTE — Progress Notes (Signed)
Referring Physician:  Kris Hartmann, NP [address],  [place] [id number]  Primary Physician:  [facility]  History of Present Illness: 11/05/2022 Ms. Kristie Olson has a history of HTN, TIA, PAD, asthma, DM, and peripheral neuropathy.   She had angiogram with intervention to her lower extremities 09/03/22 and 09/08/22.   She was referred here from vascular.   She has chronic intermittent LBP with bilateral, right > left, groin and buttock pain along with intermittent right shin pain that is worse with standing and walking. She's had this pain for years. She has minimal pain with sitting. She cannot walk far at all due to pain. Some relief with grocery cart. No weakness in legs.   She was on PLAVIX- has been out of it for 2 weeks.   Allergy noted to motrin and NSAIDs (GI upset).   She smokes at least 1 ppd.    Conservative measures:  Physical therapy: no  Multimodal medical therapy including regular antiinflammatories: tylenol  Injections: No lumbar epidural steroid injections  Past Surgery: no spinal surgery  Betzaira Mentel Berberian has no symptoms of cervical myelopathy. Has some chronic balance issues with pain/arthritis in hands.   The symptoms are causing a significant impact on the patient's life.   Review of Systems:  A 10 point review of systems is negative, except for the pertinent positives and negatives detailed in the HPI.  Past Medical History: Past Medical History:  Diagnosis Date   Asthma    Coronary artery disease    Diabetes mellitus without complication ([place]) 1572   Fibromyalgia    Hypertension    PAD (peripheral artery disease) ([place]) 09/03/2022    Past Surgical History: Past Surgical History:  Procedure Laterality Date   BREAST EXCISIONAL BIOPSY Left 1980's   neg   BREAST MASS EXCISION Left    age 48"s, benign   CESAREAN SECTION     CHOLECYSTECTOMY  2009   LOWER EXTREMITY ANGIOGRAPHY Left 09/03/2022    Procedure: Lower Extremity Angiography;  Surgeon: Algernon Huxley, MD;  Location: [facility];  Service: Cardiovascular;  Laterality: Left;   LOWER EXTREMITY ANGIOGRAPHY Right 09/08/2022   Procedure: Lower Extremity Angiography;  Surgeon: Algernon Huxley, MD;  Location: [facility];  Service: Cardiovascular;  Laterality: Right;    Allergies: Allergies as of 11/05/2022 - Review Complete 11/05/2022  Allergen Reaction Noted   Ibuprofen Other (See Comments) 11/22/2006   Nsaids  12/05/2008   Percocet [oxycodone-acetaminophen] Itching 09/08/2022    Medications: Outpatient Encounter Medications as of 11/05/2022  Medication Sig   acetaminophen (TYLENOL) 325 MG tablet Take 2 tablets (650 mg total) by mouth every 6 (six) hours as needed for mild pain (or Fever >/= 101).   albuterol (PROVENTIL HFA;VENTOLIN HFA) 108 (90 Base) MCG/ACT inhaler Inhale 2 puffs into the lungs every 4 (four) hours as needed for wheezing or shortness of breath.   amitriptyline (ELAVIL) 100 MG tablet Take 10 mg by mouth at bedtime as needed for sleep.   aspirin EC 81 MG tablet Take 1 tablet (81 mg total) by mouth daily. Swallow whole.   atorvastatin (LIPITOR) 10 MG tablet Take 1 tablet (10 mg total) by mouth daily.   esomeprazole (NEXIUM) 20 MG capsule Take 20 mg by mouth daily at 12 noon.   FLUoxetine (PROZAC) 40 MG capsule Take 1 capsule by mouth daily.   glipiZIDE (GLUCOTROL) 10 MG tablet Take 10 mg by mouth daily as needed. (  Patient not taking: Reported on 10/05/2022)   insulin NPH-regular Human (70-30) 100 UNIT/ML injection Inject 25-70 Units into the skin in the morning and at bedtime. 70 qam 25 qhs   JARDIANCE 25 MG TABS tablet Take 25 mg by mouth daily.   lisinopril-hydrochlorothiazide (ZESTORETIC) 20-25 MG tablet Take 1 tablet by mouth daily.   OZEMPIC, 0.25 OR 0.5 MG/DOSE, 2 MG/1.5ML SOPN Inject 1.5 mLs into the skin daily.   No facility-administered encounter medications on file as of 11/05/2022.     Social History: Social History   Tobacco Use   Smoking status: Every Day    Packs/day: 1.00    Years: 46.00    Total pack years: 46.00    Types: Cigarettes   Smokeless tobacco: Never  Vaping Use   Vaping Use: Never used  Substance Use Topics   Alcohol use: No    Family Medical History: Family History  Problem Relation Age of Onset   Breast cancer Sister 25   Breast cancer Paternal Aunt     Physical Examination: Vitals:   11/05/22 1316  BP: 117/67  Pulse: 95    General: Patient is well developed, well nourished, calm, collected, and in no apparent distress. Attention to examination is appropriate.  Respiratory: Patient is breathing without any difficulty.   NEUROLOGICAL:     Awake, alert, oriented to person, place, and time.  Speech is clear and fluent. Fund of knowledge is appropriate.   Cranial Nerves: Pupils equal round and reactive to light.  Facial tone is symmetric.  Facial sensation is symmetric.  No lower lumbar tenderness is noted.   No abnormal lesions on exposed skin.   Strength: Side Biceps Triceps Deltoid Interossei Grip Wrist Ext. Wrist Flex.  R '5 5 5 5 5 5 5  '$ L '5 5 5 5 5 5 5   '$ Side Iliopsoas Quads Hamstring PF DF EHL  R '5 5 5 5 5 5  '$ L '5 5 5 5 5 5   '$ Reflexes are 2+ and symmetric at the biceps, triceps, brachioradialis, patella and achilles.   Hoffman's is absent.  Clonus is not present.   Bilateral upper and lower extremity sensation is intact to light touch.     She ambulates slowly with a cane.    Medical Decision Making  Imaging: No lumbar imaging available.   Assessment and Plan: Ms. Kalmar is a pleasant 66 y.o. female with chronic intermittent LBP with bilateral, right > left, groin and buttock pain along with intermittent right shin pain that is worse with standing and walking. She's had this pain for years. She cannot walk far at all due to pain. Some relief with grocery cart.   No lumbar imaging available. Her symptoms  are suspicious for spinal stenosis.   Treatment options discussed with patient and following plan made:   - MRI of lumbar spine to evaluate for spinal stenosis. No improvement with time or medications. - Message to vascular Eulogio Ditch NP)- she has been off plavix for 2 weeks and needs a refill.  - Smoking cessation discussed and encouraged. She does not appear interested in quitting.  - Depending on results of MRI, consider referral to PMR for injections. She is not interested in any spine surgery.  - Will schedule follow up with her to see me in clinic to review her lumbar MRI once I have the results.   I spent a total of 30 minutes in face-to-face and non-face-to-face activities related to this patient's care today including review  of outside records, review of imaging, review of symptoms, physical exam, discussion of differential diagnosis, discussion of treatment options, and documentation.   Thank you for involving me in the care of this patient.   Geronimo Boot PA-C Dept. of Neurosurgery

## 2022-11-05 ENCOUNTER — Ambulatory Visit (INDEPENDENT_AMBULATORY_CARE_PROVIDER_SITE_OTHER): Payer: Medicare Other | Admitting: Orthopedic Surgery

## 2022-11-05 ENCOUNTER — Encounter: Payer: Self-pay | Admitting: Orthopedic Surgery

## 2022-11-05 VITALS — BP 117/67 | HR 95 | Ht 63.75 in | Wt 207.2 lb

## 2022-11-05 DIAGNOSIS — M5441 Lumbago with sciatica, right side: Secondary | ICD-10-CM | POA: Diagnosis not present

## 2022-11-05 DIAGNOSIS — M5416 Radiculopathy, lumbar region: Secondary | ICD-10-CM | POA: Diagnosis not present

## 2022-11-05 DIAGNOSIS — M5442 Lumbago with sciatica, left side: Secondary | ICD-10-CM | POA: Diagnosis not present

## 2022-11-05 NOTE — Patient Instructions (Signed)
It was so nice to see you today, I am sorry that you are hurting so much.   I want to get an MRI of your back. I think the pain in your legs is likely coming from your back. We will work on getting the MRI approved with your insurance and then they will call you to schedule it at the hospital.   Once I have the results back, we will call you to set up a visit with me to review them.   I sent a message to Eulogio Ditch NP- she is the nurse practitioner that you saw with vascular. She will call you back to let you know if you should still be taking your plavix.   Try to work on cutting down on your smoking as much as you can.    Please do not hesitate to call if you have any questions or concerns. You can also message me in [place].   Geronimo Boot PA-C [phone]

## 2022-11-18 ENCOUNTER — Ambulatory Visit
Admission: RE | Admit: 2022-11-18 | Discharge: 2022-11-18 | Disposition: A | Discharge status: Home / Self Care | Payer: Medicare Other | Source: Ambulatory Visit | Attending: Orthopedic Surgery | Admitting: Orthopedic Surgery

## 2022-11-18 DIAGNOSIS — M5442 Lumbago with sciatica, left side: Secondary | ICD-10-CM

## 2022-11-18 DIAGNOSIS — M5416 Radiculopathy, lumbar region: Secondary | ICD-10-CM | POA: Diagnosis present

## 2022-11-18 DIAGNOSIS — M5441 Lumbago with sciatica, right side: Secondary | ICD-10-CM | POA: Diagnosis present

## 2022-11-29 NOTE — Progress Notes (Signed)
Referring Physician:  No referring provider defined for this encounter.  Primary Physician:  Center, [facility]  History of Present Illness: 11/29/2022 Ms. Kristie Olson has a history of HTN, TIA, PAD, asthma, DM, and peripheral neuropathy.   She had angiogram with intervention to her lower extremities 09/03/22 and 09/08/22.   Last seen by me on 11/05/22 for chronic intermittent LBP with bilateral, right > left, groin and buttock pain along with intermittent right shin pain that is worse with standing and walking.   Smoking cessation discussed and encouraged. MRI of lumbar spine was ordered and she is here to review the results.   She continues with chronic intermittent LBP with bilateral, right > left, groin and buttock pain along with intermittent right shin pain that is worse with standing and walking. She cannot walk far at all due to pain.   She was on PLAVIX- has been out of it since before her last visit with me. I sent a message to vascular after that visit and she has not heard back.   Allergy noted to motrin and NSAIDs (GI upset).   She smokes at least 1 ppd.    Conservative measures:  Physical therapy: no  Multimodal medical therapy including regular antiinflammatories: tylenol  Injections: No lumbar epidural steroid injections  Past Surgery: no spinal surgery  Kristie Olson has no symptoms of cervical myelopathy. Has some chronic balance issues with pain/arthritis in hands.   The symptoms are causing a significant impact on the patient's life.   Review of Systems:  A 10 point review of systems is negative, except for the pertinent positives and negatives detailed in the HPI.  Past Medical History: Past Medical History:  Diagnosis Date   Asthma    Coronary artery disease    Diabetes mellitus without complication ([place]) 9357   Fibromyalgia    Hypertension    PAD (peripheral artery disease) ([place]) 09/03/2022    Past Surgical History: Past  Surgical History:  Procedure Laterality Date   BREAST EXCISIONAL BIOPSY Left 1980's   neg   BREAST MASS EXCISION Left    age 75"s, benign   CESAREAN SECTION     CHOLECYSTECTOMY  2009   LOWER EXTREMITY ANGIOGRAPHY Left 09/03/2022   Procedure: Lower Extremity Angiography;  Surgeon: Algernon Huxley, MD;  Location: [place] [facility];  Service: Cardiovascular;  Laterality: Left;   LOWER EXTREMITY ANGIOGRAPHY Right 09/08/2022   Procedure: Lower Extremity Angiography;  Surgeon: Algernon Huxley, MD;  Location: [place] [facility];  Service: Cardiovascular;  Laterality: Right;    Allergies: Allergies as of 12/07/2022 - Review Complete 11/05/2022  Allergen Reaction Noted   Ibuprofen Other (See Comments) 11/22/2006   Nsaids  12/05/2008   Percocet [oxycodone-acetaminophen] Itching 09/08/2022    Medications: Outpatient Encounter Medications as of 12/07/2022  Medication Sig   acetaminophen (TYLENOL) 325 MG tablet Take 2 tablets (650 mg total) by mouth every 6 (six) hours as needed for mild pain (or Fever >/= 101).   albuterol (PROVENTIL HFA;VENTOLIN HFA) 108 (90 Base) MCG/ACT inhaler Inhale 2 puffs into the lungs every 4 (four) hours as needed for wheezing or shortness of breath.   amitriptyline (ELAVIL) 100 MG tablet Take 10 mg by mouth at bedtime as needed for sleep.   aspirin EC 81 MG tablet Take 1 tablet (81 mg total) by mouth daily. Swallow whole.   atorvastatin (LIPITOR) 10 MG tablet Take 1 tablet (10 mg total) by mouth daily.   esomeprazole ([place]) 20  MG capsule Take 20 mg by mouth daily at 12 noon.   FLUoxetine (PROZAC) 40 MG capsule Take 1 capsule by mouth daily.   glipiZIDE (GLUCOTROL) 10 MG tablet Take 10 mg by mouth daily as needed. (Patient not taking: Reported on 10/05/2022)   insulin NPH-regular Human (70-30) 100 UNIT/ML injection Inject 25-70 Units into the skin in the morning and at bedtime. 70 qam 25 qhs   JARDIANCE 25 MG TABS tablet Take 25 mg by mouth daily.    lisinopril-hydrochlorothiazide (ZESTORETIC) 20-25 MG tablet Take 1 tablet by mouth daily.   OZEMPIC, 0.25 OR 0.5 MG/DOSE, 2 MG/1.5ML SOPN Inject 1.5 mLs into the skin daily.   No facility-administered encounter medications on file as of 12/07/2022.    Social History: Social History   Tobacco Use   Smoking status: Every Day    Packs/day: 1.00    Years: 46.00    Total pack years: 46.00    Types: Cigarettes   Smokeless tobacco: Never  Vaping Use   Vaping Use: Never used  Substance Use Topics   Alcohol use: No    Family Medical History: Family History  Problem Relation Age of Onset   Breast cancer Sister 47   Breast cancer Paternal Aunt     Physical Examination: There were no vitals filed for this visit.    Awake, alert, oriented to person, place, and time.  Speech is clear and fluent. Fund of knowledge is appropriate.   Cranial Nerves: Pupils equal round and reactive to light.  Facial tone is symmetric.    No abnormal lesions on exposed skin.   Strength:  Side Iliopsoas Quads Hamstring PF DF EHL  R '5 5 5 5 5 5  '$ L '5 5 5 5 5 5   '$ Clonus is not present.    Bilateral lower extremity sensation is intact to light touch.     She ambulates slowly with a cane.    Medical Decision Making  Imaging: Lumbar MRI dated 11/18/22:  FINDINGS: Segmentation: Transitional anatomy with sacralization of the L5 vertebral body.   Alignment:  Physiologic.   Vertebrae: No acute fracture, evidence of discitis, or aggressive bone lesion.   Conus medullaris and cauda equina: Conus extends to the L1 level. Conus and cauda equina appear normal.   Paraspinal and other soft tissues: No acute paraspinal abnormality.   Disc levels:   Disc spaces: Disc desiccation throughout the lumbar spine. No significant disc height loss.   T12-L1: No significant disc bulge. No neural foraminal stenosis. No central canal stenosis. Mild bilateral facet arthropathy.   L1-L2: No significant disc  bulge. No neural foraminal stenosis. No central canal stenosis. Mild bilateral facet arthropathy.   L2-L3: No significant disc bulge. No neural foraminal stenosis. No central canal stenosis. Mild-moderate bilateral facet arthropathy.   L3-L4: Mild broad-based disc bulge. Moderate bilateral facet arthropathy, right worse than left. Mild spinal stenosis. Mild right foraminal stenosis. No left foraminal stenosis.   L4-L5: Mild broad-based disc bulge. Moderate left and mild right facet arthropathy. No foraminal or central canal stenosis.   L5-S1: No significant disc bulge. No neural foraminal stenosis. No central canal stenosis.   IMPRESSION: 1. Mild lumbar spine spondylosis as described above. 2. No acute osseous injury of the lumbar spine.     Electronically Signed   By: Kathreen Devoid M.D.   On: 11/20/2022 08:46  I have personally reviewed the images and agree with the above interpretation.   Assessment and Plan: Ms. Gunnoe is a pleasant  67 y.o. female with chronic intermittent LBP with bilateral, right > left, groin and buttock pain along with intermittent right shin pain that is worse with standing and walking.   She has known lumbar spondylosis and facet hypertrophy- this is likely cause of her LBP. She has mild central stenosis L3-L4 and some foraminal stenosis L3-L5. This may be contributing to her leg pain, but she may also have vascular component.   Treatment options discussed with patient and following plan made:   - Referral to PMR at [facility] to discuss possible lumbar injections.  - Message to vascular Eulogio Ditch NP and Dr. Lucky Cowboy)- she has been off plavix since prior to her last visit. She is not sure if she's supposed to still be taking it. Needs a refill if she is.  - Smoking cessation discussed and encouraged. She does not appear interested in quitting.  - She is not interested in any PT for her lumbar spine.  - She has phone follow up with me in 6-8 weeks.   I spent a  total of 25 minutes in face-to-face and non-face-to-face activities related to this patient's care today including review of outside records, review of imaging, review of symptoms, physical exam, discussion of differential diagnosis, discussion of treatment options, and documentation.   Geronimo Boot PA-C Dept. of Neurosurgery

## 2022-12-07 ENCOUNTER — Encounter: Payer: Self-pay | Admitting: Orthopedic Surgery

## 2022-12-07 ENCOUNTER — Other Ambulatory Visit (INDEPENDENT_AMBULATORY_CARE_PROVIDER_SITE_OTHER): Payer: Self-pay | Admitting: Nurse Practitioner

## 2022-12-07 ENCOUNTER — Ambulatory Visit (INDEPENDENT_AMBULATORY_CARE_PROVIDER_SITE_OTHER): Payer: 59 | Admitting: Orthopedic Surgery

## 2022-12-07 VITALS — BP 130/68 | Ht 63.75 in | Wt 205.4 lb

## 2022-12-07 DIAGNOSIS — M48061 Spinal stenosis, lumbar region without neurogenic claudication: Secondary | ICD-10-CM

## 2022-12-07 DIAGNOSIS — M5416 Radiculopathy, lumbar region: Secondary | ICD-10-CM

## 2022-12-07 DIAGNOSIS — M4726 Other spondylosis with radiculopathy, lumbar region: Secondary | ICD-10-CM

## 2022-12-07 DIAGNOSIS — M47816 Spondylosis without myelopathy or radiculopathy, lumbar region: Secondary | ICD-10-CM

## 2022-12-07 MED ORDER — CLOPIDOGREL BISULFATE 75 MG PO TABS: 75.0000 mg | ORAL_TABLET | Freq: Every day | ORAL | 6 refills | Status: DC

## 2022-12-07 NOTE — Patient Instructions (Signed)
It was so nice to see you today. Thank you so much for coming in.    Your lower back MRI showed some wear and tear (arthritis) in your back. I think this is causing your back pain. Some of your leg pain may be from your back, some of it may be from your circulation.   Be sure to follow up with vascular as scheduled. I will send a message to [facility] and Dr. Lucky Cowboy about your plavix. Call them at [phone] if you don't hear from them.   I want you to see physical medicine and rehab at the [facility] to discuss possible injections in your lower back. Dr. Sharlet Salina, Dr. Alba Destine, and their PA Loree Fee are great and will take good care of you. They should call you to schedule an appointment or you can call them at [phone].   We have a phone visit in 6-8 weeks for follow up. I will call you on your cell. Please do not hesitate to call if you have any questions or concerns. You can also message me in [place].   If you have not heard back about any of the above things in the next week, please call the office so we can help you get them scheduled.   Geronimo Boot PA-C [phone]

## 2022-12-31 ENCOUNTER — Other Ambulatory Visit (INDEPENDENT_AMBULATORY_CARE_PROVIDER_SITE_OTHER): Payer: Self-pay | Admitting: Nurse Practitioner

## 2022-12-31 DIAGNOSIS — I739 Peripheral vascular disease, unspecified: Secondary | ICD-10-CM

## 2023-01-04 ENCOUNTER — Ambulatory Visit (INDEPENDENT_AMBULATORY_CARE_PROVIDER_SITE_OTHER): Payer: 59

## 2023-01-04 ENCOUNTER — Encounter (INDEPENDENT_AMBULATORY_CARE_PROVIDER_SITE_OTHER): Payer: Self-pay | Admitting: Nurse Practitioner

## 2023-01-04 ENCOUNTER — Ambulatory Visit (INDEPENDENT_AMBULATORY_CARE_PROVIDER_SITE_OTHER): Payer: 59 | Admitting: Nurse Practitioner

## 2023-01-04 VITALS — BP 144/77 | HR 77 | Resp 18 | Ht 60.0 in | Wt 204.0 lb

## 2023-01-04 DIAGNOSIS — F17200 Nicotine dependence, unspecified, uncomplicated: Secondary | ICD-10-CM

## 2023-01-04 DIAGNOSIS — I10 Essential (primary) hypertension: Secondary | ICD-10-CM

## 2023-01-04 DIAGNOSIS — Z9889 Other specified postprocedural states: Secondary | ICD-10-CM

## 2023-01-04 DIAGNOSIS — I70222 Atherosclerosis of native arteries of extremities with rest pain, left leg: Secondary | ICD-10-CM

## 2023-01-04 DIAGNOSIS — I739 Peripheral vascular disease, unspecified: Secondary | ICD-10-CM

## 2023-01-04 DIAGNOSIS — R52 Pain, unspecified: Secondary | ICD-10-CM | POA: Diagnosis not present

## 2023-01-05 ENCOUNTER — Encounter (INDEPENDENT_AMBULATORY_CARE_PROVIDER_SITE_OTHER): Payer: Self-pay | Admitting: Nurse Practitioner

## 2023-01-05 LAB — VAS US ABI WITH/WO TBI
Left ABI: 0.61
Right ABI: 1.03

## 2023-01-05 NOTE — Progress Notes (Signed)
Subjective:    Patient ID: Kristie Olson, female    DOB: [birth year], 67 y.o.   MRN: [id number] Chief Complaint  Patient presents with   Follow-up    f/u in 3 months with abi and b.l. art. duplex    Kristie Olson is a 67 year old female that returns to the office for followup and review of the noninvasive studies.   The patient notes that there has been a significant deterioration in the lower extremity symptoms.  The patient notes interval shortening of their claudication distance and development of mild rest pain symptoms. No new ulcers or wounds have occurred since the last visit.  Following the patient's discharge from the hospital, the Plavix expired and she had been out for short time before being refilled.  There have been no significant changes to the patient's overall health care.  The patient denies amaurosis fugax or recent TIA symptoms. There are no recent neurological changes noted. There is no history of DVT, PE or superficial thrombophlebitis. The patient denies recent episodes of angina or shortness of breath.   ABI's Rt=1.03 and Lt=0.61 (previous ABI's Rt=1.11 and Lt=1.07) Duplex US of the lower extremity arterial system shows right lower extremity shows a patent stent placement, with monophasic waveforms from the level of the proximal SFA.  A left lower extremity has an occluded SFA from the region.  The popliteal artery is patent however in the patient has low monophasic flow.    Review of Systems  Musculoskeletal:  Positive for back pain and gait problem.  Neurological:  Positive for weakness.  All other systems reviewed and are negative.      Objective:   Physical Exam Vitals reviewed.  HENT:     Head: Normocephalic.  Cardiovascular:     Rate and Rhythm: Normal rate.     Pulses:          Dorsalis pedis pulses are detected w/ Doppler on the right side and detected w/ Doppler on the left side.       Posterior tibial pulses are detected w/ Doppler on  the right side and detected w/ Doppler on the left side.  Pulmonary:     Effort: Pulmonary effort is normal.  Skin:    General: Skin is warm and dry.  Neurological:     Mental Status: She is alert and oriented to person, place, and time.  Psychiatric:        Mood and Affect: Mood normal.        Behavior: Behavior normal.        Thought Content: Thought content normal.        Judgment: Judgment normal.     BP (!) 144/77 (BP Location: Right Arm)   Pulse 77   Resp 18   Ht 5' (1.524 m)   Wt 204 lb (92.5 kg)   BMI 39.84 kg/m   Past Medical History:  Diagnosis Date   Asthma    Coronary artery disease    Diabetes mellitus without complication (HCC) 0000000   Fibromyalgia    Hypertension    PAD (peripheral artery disease) ([place]) 09/03/2022    Social History   Socioeconomic History   Marital status: Married    Spouse name: Not on file   Number of children: Not on file   Years of education: Not on file   Highest education level: Not on file  Occupational History   Not on file  Tobacco Use   Smoking status: Every Day    Packs/day:  1.00    Years: 46.00    Total pack years: 46.00    Types: Cigarettes   Smokeless tobacco: Never  Vaping Use   Vaping Use: Never used  Substance and Sexual Activity   Alcohol use: No   Drug use: Not on file   Sexual activity: Not on file  Other Topics Concern   Not on file  Social History Narrative   Not on file   Social Determinants of Health   Financial Resource Strain: Not on file  Food Insecurity: No Food Insecurity (09/03/2022)   Hunger Vital Sign    Worried About Running Out of Food in the Last Year: Never true    Ran Out of Food in the Last Year: Never true  Transportation Needs: No Transportation Needs (09/03/2022)   PRAPARE - Hydrologist (Medical): No    Lack of Transportation (Non-Medical): No  Physical Activity: Not on file  Stress: Not on file  Social Connections: Not on file  Intimate  Partner Violence: Not At Risk (09/03/2022)   Humiliation, Afraid, Rape, and Kick questionnaire    Fear of Current or Ex-Partner: No    Emotionally Abused: No    Physically Abused: No    Sexually Abused: No    Past Surgical History:  Procedure Laterality Date   BREAST EXCISIONAL BIOPSY Left 1980's   neg   BREAST MASS EXCISION Left    age 11"s, benign   CESAREAN SECTION     CHOLECYSTECTOMY  2009   LOWER EXTREMITY ANGIOGRAPHY Left 09/03/2022   Procedure: Lower Extremity Angiography;  Surgeon: Algernon Huxley, MD;  Location: [place] [facility];  Service: Cardiovascular;  Laterality: Left;   LOWER EXTREMITY ANGIOGRAPHY Right 09/08/2022   Procedure: Lower Extremity Angiography;  Surgeon: Algernon Huxley, MD;  Location: [place] [facility];  Service: Cardiovascular;  Laterality: Right;    Family History  Problem Relation Age of Onset   Breast cancer Sister 70   Breast cancer Paternal Aunt     Allergies  Allergen Reactions   Ibuprofen Other (See Comments)    STOMACHACHE   Nsaids     Other reaction(s): Other (See Comments) PUD   Percocet [Oxycodone-Acetaminophen] Itching    Patient states "Percocet makes me itch all over"       Latest Ref Rng & Units 09/09/2022    5:31 AM 09/08/2022    4:47 AM 09/07/2022    4:24 AM  CBC  WBC 4.0 - 10.5 K/uL 9.2  7.4  9.1   Hemoglobin 12.0 - 15.0 g/dL 12.3  13.0  12.6   Hematocrit 36.0 - 46.0 % 36.2  37.9  37.0   Platelets 150 - 400 K/uL 438  420  385       CMP     Component Value Date/Time   NA 134 (L) 09/09/2022 0531   NA 132 (L) 07/14/2013 2143   K 4.2 09/09/2022 0531   K 4.3 07/14/2013 2143   CL 98 09/09/2022 0531   CL 95 (L) 07/14/2013 2143   CO2 28 09/09/2022 0531   CO2 31 07/14/2013 2143   GLUCOSE 224 (H) 09/09/2022 0531   GLUCOSE 433 (H) 07/14/2013 2143   BUN 11 09/09/2022 0531   BUN 12 07/14/2013 2143   CREATININE 0.69 09/09/2022 0531   CREATININE 0.91 07/14/2013 2143   CALCIUM 8.9 09/09/2022 0531   CALCIUM 9.3  07/14/2013 2143   PROT 8.2 (H) 04/28/2020 0423   PROT 7.9 07/14/2013 2143  ALBUMIN 4.3 04/28/2020 0423   ALBUMIN 3.7 07/14/2013 2143   AST 17 04/28/2020 0423   AST 16 07/14/2013 2143   ALT 14 04/28/2020 0423   ALT 20 07/14/2013 2143   ALKPHOS 89 04/28/2020 0423   ALKPHOS 118 07/14/2013 2143   BILITOT 0.6 04/28/2020 0423   BILITOT 0.3 07/14/2013 2143   GFRNONAA >60 09/09/2022 0531   GFRNONAA >60 07/14/2013 2143   GFRAA >60 07/01/2020 1636   GFRAA >60 07/14/2013 2143     No results found.     Assessment & Plan:   1. Atherosclerosis of native artery of left lower extremity with rest pain (HCC) Recommend:  The patient has evidence of severe atherosclerotic changes of both lower extremities with rest pain that is associated with preulcerative changes and impending tissue loss of the left foot.  This represents a limb threatening ischemia and places the patient at the risk for left limb loss.  Patient should undergo angiography of the left lower extremity with the hope for intervention for limb salvage.  The risks and benefits as well as the alternative therapies was discussed in detail with the patient.  All questions were answered.  Patient agrees to proceed with left lower extremity angiography.  The patient will follow up with me in the office after the procedure.      2. Pain aggravated by standing Ice is the patient's pain is largely multifactorial.  I believe that a good portion of her pain in the moment relates to her occluded SFA stents however the description of the patient's pain and the timing leads me to suspect that it is also related to her known radiculopathy.  The patient will continue to follow with physiatry as well as intervention as noted above.  3. Benign essential hypertension Continue antihypertensive medications as already ordered, these medications have been reviewed and there are no changes at this time.  4. Tobacco use disorder Long discussion with  the patient regarding her tobacco use and her peripheral arterial disease.  Discussed with the patient that ongoing tobacco use will likely continue to do damage to arteries and pose the possibility of worsening arterial status over time.  I discussed with the patient that continuing to smoke and the long-term could ultimately pose with lingering issues.  Despite this discussion the patient is completely unready to stop smoking   Current Outpatient Medications on File Prior to Visit  Medication Sig Dispense Refill   acetaminophen (TYLENOL) 325 MG tablet Take 2 tablets (650 mg total) by mouth every 6 (six) hours as needed for mild pain (or Fever >/= 101).     albuterol (PROVENTIL HFA;VENTOLIN HFA) 108 (90 Base) MCG/ACT inhaler Inhale 2 puffs into the lungs every 4 (four) hours as needed for wheezing or shortness of breath.     amitriptyline (ELAVIL) 100 MG tablet Take 10 mg by mouth at bedtime as needed for sleep.     aspirin EC 81 MG tablet Take 1 tablet (81 mg total) by mouth daily. Swallow whole. 30 tablet 0   clopidogrel (PLAVIX) 75 MG tablet Take 1 tablet (75 mg total) by mouth daily. 30 tablet 6   Continuous Blood Gluc Sensor (FREESTYLE LIBRE 2 SENSOR) MISC USE AS DIRECTED EVERY 2 WEEKS     esomeprazole ([place]) 20 MG capsule Take 20 mg by mouth daily at 12 noon.     FLUoxetine (PROZAC) 40 MG capsule Take 1 capsule by mouth daily.     glipiZIDE (GLUCOTROL) 10 MG tablet Take 10  mg by mouth daily as needed.     insulin NPH-regular Human (70-30) 100 UNIT/ML injection Inject 25-70 Units into the skin in the morning and at bedtime. 70 qam 25 qhs     JARDIANCE 25 MG TABS tablet Take 25 mg by mouth daily.     lisinopril-hydrochlorothiazide (ZESTORETIC) 20-25 MG tablet Take 1 tablet by mouth daily.     OZEMPIC, 0.25 OR 0.5 MG/DOSE, 2 MG/1.5ML SOPN Inject 1.5 mLs into the skin daily.     triamcinolone cream (KENALOG) 0.1 % Apply topically.     atorvastatin (LIPITOR) 10 MG tablet Take 1 tablet (10 mg  total) by mouth daily. 30 tablet 0   No current facility-administered medications on file prior to visit.    There are no Patient Instructions on file for this visit. No follow-ups on file.   Kris Hartmann, NP

## 2023-01-05 NOTE — H&P (View-Only) (Signed)
 Subjective:    Patient ID: Kristie Olson, female    DOB: 03/29/1956, 66 y.o.   MRN: [id number] Chief Complaint  Patient presents with   Follow-up    f/u in 3 months with abi and b.l. art. duplex    Kristie Olson is a 66-year-old female that returns to the office for followup and review of the noninvasive studies.   The patient notes that there has been a significant deterioration in the lower extremity symptoms.  The patient notes interval shortening of their claudication distance and development of mild rest pain symptoms. No new ulcers or wounds have occurred since the last visit.  Following the patient's discharge from the hospital, the Plavix expired and Kristie Olson had been out for short time before being refilled.  There have been no significant changes to the patient's overall health care.  The patient denies amaurosis fugax or recent TIA symptoms. There are no recent neurological changes noted. There is no history of DVT, PE or superficial thrombophlebitis. The patient denies recent episodes of angina or shortness of breath.   ABI's Rt=1.03 and Lt=0.61 (previous ABI's Rt=1.11 and Lt=1.07) Duplex US of the lower extremity arterial system shows right lower extremity shows a patent stent placement, with monophasic waveforms from the level of the proximal SFA.  A left lower extremity has an occluded SFA from the region.  The popliteal artery is patent however in the patient has low monophasic flow.    Review of Systems  Musculoskeletal:  Positive for back pain and gait problem.  Neurological:  Positive for weakness.  All other systems reviewed and are negative.      Objective:   Physical Exam Vitals reviewed.  HENT:     Head: Normocephalic.  Cardiovascular:     Rate and Rhythm: Normal rate.     Pulses:          Dorsalis pedis pulses are detected w/ Doppler on the right side and detected w/ Doppler on the left side.       Posterior tibial pulses are detected w/ Doppler on  the right side and detected w/ Doppler on the left side.  Pulmonary:     Effort: Pulmonary effort is normal.  Skin:    General: Skin is warm and dry.  Neurological:     Mental Status: Kristie Olson is alert and oriented to person, place, and time.  Psychiatric:        Mood and Affect: Mood normal.        Behavior: Behavior normal.        Thought Content: Thought content normal.        Judgment: Judgment normal.     BP (!) 144/77 (BP Location: Right Arm)   Pulse 77   Resp 18   Ht 5' (1.524 m)   Wt 204 lb (92.5 kg)   BMI 39.84 kg/m   Past Medical History:  Diagnosis Date   Asthma    Coronary artery disease    Diabetes mellitus without complication (HCC) 1990   Fibromyalgia    Hypertension    PAD (peripheral artery disease) (HCC) 09/03/2022    Social History   Socioeconomic History   Marital status: Married    Spouse name: Not on file   Number of children: Not on file   Years of education: Not on file   Highest education level: Not on file  Occupational History   Not on file  Tobacco Use   Smoking status: Every Day    Packs/day:   1.00    Years: 46.00    Total pack years: 46.00    Types: Cigarettes   Smokeless tobacco: Never  Vaping Use   Vaping Use: Never used  Substance and Sexual Activity   Alcohol use: No   Drug use: Not on file   Sexual activity: Not on file  Other Topics Concern   Not on file  Social History Narrative   Not on file   Social Determinants of Health   Financial Resource Strain: Not on file  Food Insecurity: No Food Insecurity (09/03/2022)   Hunger Vital Sign    Worried About Running Out of Food in the Last Year: Never true    Ran Out of Food in the Last Year: Never true  Transportation Needs: No Transportation Needs (09/03/2022)   PRAPARE - Transportation    Lack of Transportation (Medical): No    Lack of Transportation (Non-Medical): No  Physical Activity: Not on file  Stress: Not on file  Social Connections: Not on file  Intimate  Partner Violence: Not At Risk (09/03/2022)   Humiliation, Afraid, Rape, and Kick questionnaire    Fear of Current or Ex-Partner: No    Emotionally Abused: No    Physically Abused: No    Sexually Abused: No    Past Surgical History:  Procedure Laterality Date   BREAST EXCISIONAL BIOPSY Left 1980's   neg   BREAST MASS EXCISION Left    age 30"s, benign   CESAREAN SECTION     CHOLECYSTECTOMY  2009   LOWER EXTREMITY ANGIOGRAPHY Left 09/03/2022   Procedure: Lower Extremity Angiography;  Surgeon: Dew, Jason S, MD;  Location: [facility];  Service: Cardiovascular;  Laterality: Left;   LOWER EXTREMITY ANGIOGRAPHY Right 09/08/2022   Procedure: Lower Extremity Angiography;  Surgeon: Dew, Jason S, MD;  Location: [facility];  Service: Cardiovascular;  Laterality: Right;    Family History  Problem Relation Age of Onset   Breast cancer Sister 54   Breast cancer Paternal Aunt     Allergies  Allergen Reactions   Ibuprofen Other (See Comments)    STOMACHACHE   Nsaids     Other reaction(s): Other (See Comments) PUD   Percocet [Oxycodone-Acetaminophen] Itching    Patient states "Percocet makes me itch all over"       Latest Ref Rng & Units 09/09/2022    5:31 AM 09/08/2022    4:47 AM 09/07/2022    4:24 AM  CBC  WBC 4.0 - 10.5 K/uL 9.2  7.4  9.1   Hemoglobin 12.0 - 15.0 g/dL 12.3  13.0  12.6   Hematocrit 36.0 - 46.0 % 36.2  37.9  37.0   Platelets 150 - 400 K/uL 438  420  385       CMP     Component Value Date/Time   NA 134 (L) 09/09/2022 0531   NA 132 (L) 07/14/2013 2143   K 4.2 09/09/2022 0531   K 4.3 07/14/2013 2143   CL 98 09/09/2022 0531   CL 95 (L) 07/14/2013 2143   CO2 28 09/09/2022 0531   CO2 31 07/14/2013 2143   GLUCOSE 224 (H) 09/09/2022 0531   GLUCOSE 433 (H) 07/14/2013 2143   BUN 11 09/09/2022 0531   BUN 12 07/14/2013 2143   CREATININE 0.69 09/09/2022 0531   CREATININE 0.91 07/14/2013 2143   CALCIUM 8.9 09/09/2022 0531   CALCIUM 9.3  07/14/2013 2143   PROT 8.2 (H) 04/28/2020 0423   PROT 7.9 07/14/2013 2143     ALBUMIN 4.3 04/28/2020 0423   ALBUMIN 3.7 07/14/2013 2143   AST 17 04/28/2020 0423   AST 16 07/14/2013 2143   ALT 14 04/28/2020 0423   ALT 20 07/14/2013 2143   ALKPHOS 89 04/28/2020 0423   ALKPHOS 118 07/14/2013 2143   BILITOT 0.6 04/28/2020 0423   BILITOT 0.3 07/14/2013 2143   GFRNONAA >60 09/09/2022 0531   GFRNONAA >60 07/14/2013 2143   GFRAA >60 07/01/2020 1636   GFRAA >60 07/14/2013 2143     No results found.     Assessment & Plan:   1. Atherosclerosis of native artery of left lower extremity with rest pain (HCC) Recommend:  The patient has evidence of severe atherosclerotic changes of both lower extremities with rest pain that is associated with preulcerative changes and impending tissue loss of the left foot.  This represents a limb threatening ischemia and places the patient at the risk for left limb loss.  Patient should undergo angiography of the left lower extremity with the hope for intervention for limb salvage.  The risks and benefits as well as the alternative therapies was discussed in detail with the patient.  All questions were answered.  Patient agrees to proceed with left lower extremity angiography.  The patient will follow up with me in the office after the procedure.      2. Pain aggravated by standing Ice is the patient's pain is largely multifactorial.  I believe that a good portion of her pain in the moment relates to her occluded SFA stents however the description of the patient's pain and the timing leads me to suspect that it is also related to her known radiculopathy.  The patient will continue to follow with physiatry as well as intervention as noted above.  3. Benign essential hypertension Continue antihypertensive medications as already ordered, these medications have been reviewed and there are no changes at this time.  4. Tobacco use disorder Long discussion with  the patient regarding her tobacco use and her peripheral arterial disease.  Discussed with the patient that ongoing tobacco use will likely continue to do damage to arteries and pose the possibility of worsening arterial status over time.  I discussed with the patient that continuing to smoke and the long-term could ultimately pose with lingering issues.  Despite this discussion the patient is completely unready to stop smoking   Current Outpatient Medications on File Prior to Visit  Medication Sig Dispense Refill   acetaminophen (TYLENOL) 325 MG tablet Take 2 tablets (650 mg total) by mouth every 6 (six) hours as needed for mild pain (or Fever >/= 101).     albuterol (PROVENTIL HFA;VENTOLIN HFA) 108 (90 Base) MCG/ACT inhaler Inhale 2 puffs into the lungs every 4 (four) hours as needed for wheezing or shortness of breath.     amitriptyline (ELAVIL) 100 MG tablet Take 10 mg by mouth at bedtime as needed for sleep.     aspirin EC 81 MG tablet Take 1 tablet (81 mg total) by mouth daily. Swallow whole. 30 tablet 0   clopidogrel (PLAVIX) 75 MG tablet Take 1 tablet (75 mg total) by mouth daily. 30 tablet 6   Continuous Blood Gluc Sensor (FREESTYLE LIBRE 2 SENSOR) MISC USE AS DIRECTED EVERY 2 WEEKS     esomeprazole (NEXIUM) 20 MG capsule Take 20 mg by mouth daily at 12 noon.     FLUoxetine (PROZAC) 40 MG capsule Take 1 capsule by mouth daily.     glipiZIDE (GLUCOTROL) 10 MG tablet Take 10   mg by mouth daily as needed.     insulin NPH-regular Human (70-30) 100 UNIT/ML injection Inject 25-70 Units into the skin in the morning and at bedtime. 70 qam 25 qhs     JARDIANCE 25 MG TABS tablet Take 25 mg by mouth daily.     lisinopril-hydrochlorothiazide (ZESTORETIC) 20-25 MG tablet Take 1 tablet by mouth daily.     OZEMPIC, 0.25 OR 0.5 MG/DOSE, 2 MG/1.5ML SOPN Inject 1.5 mLs into the skin daily.     triamcinolone cream (KENALOG) 0.1 % Apply topically.     atorvastatin (LIPITOR) 10 MG tablet Take 1 tablet (10 mg  total) by mouth daily. 30 tablet 0   No current facility-administered medications on file prior to visit.    There are no Patient Instructions on file for this visit. No follow-ups on file.   Mykenna Viele E Ray Gervasi, NP   

## 2023-01-18 ENCOUNTER — Ambulatory Visit
Admission: RE | Admit: 2023-01-18 | Discharge: 2023-01-18 | Disposition: A | Discharge status: Home / Self Care | Payer: 59 | Attending: Vascular Surgery | Admitting: Vascular Surgery

## 2023-01-18 ENCOUNTER — Encounter: Payer: Self-pay | Admitting: Vascular Surgery

## 2023-01-18 ENCOUNTER — Other Ambulatory Visit: Payer: Self-pay

## 2023-01-18 ENCOUNTER — Encounter
Admission: RE | Disposition: A | Discharge status: Home / Self Care | Payer: Self-pay | Source: Home / Self Care | Attending: Vascular Surgery

## 2023-01-18 DIAGNOSIS — Z794 Long term (current) use of insulin: Secondary | ICD-10-CM | POA: Diagnosis not present

## 2023-01-18 DIAGNOSIS — I700 Atherosclerosis of aorta: Secondary | ICD-10-CM

## 2023-01-18 DIAGNOSIS — I10 Essential (primary) hypertension: Secondary | ICD-10-CM | POA: Insufficient documentation

## 2023-01-18 DIAGNOSIS — F17210 Nicotine dependence, cigarettes, uncomplicated: Secondary | ICD-10-CM | POA: Diagnosis not present

## 2023-01-18 DIAGNOSIS — Y832 Surgical operation with anastomosis, bypass or graft as the cause of abnormal reaction of the patient, or of later complication, without mention of misadventure at the time of the procedure: Secondary | ICD-10-CM | POA: Insufficient documentation

## 2023-01-18 DIAGNOSIS — T82868A Thrombosis of vascular prosthetic devices, implants and grafts, initial encounter: Secondary | ICD-10-CM | POA: Diagnosis not present

## 2023-01-18 DIAGNOSIS — Z7985 Long-term (current) use of injectable non-insulin antidiabetic drugs: Secondary | ICD-10-CM | POA: Insufficient documentation

## 2023-01-18 DIAGNOSIS — I70222 Atherosclerosis of native arteries of extremities with rest pain, left leg: Secondary | ICD-10-CM | POA: Diagnosis not present

## 2023-01-18 DIAGNOSIS — I70229 Atherosclerosis of native arteries of extremities with rest pain, unspecified extremity: Secondary | ICD-10-CM

## 2023-01-18 DIAGNOSIS — Z7984 Long term (current) use of oral hypoglycemic drugs: Secondary | ICD-10-CM | POA: Insufficient documentation

## 2023-01-18 DIAGNOSIS — E1151 Type 2 diabetes mellitus with diabetic peripheral angiopathy without gangrene: Secondary | ICD-10-CM | POA: Insufficient documentation

## 2023-01-18 HISTORY — PX: LOWER EXTREMITY ANGIOGRAPHY: CATH118251

## 2023-01-18 LAB — CREATININE, SERUM
Creatinine, Ser: 0.77 mg/dL (ref 0.44–1.00)
GFR, Estimated: >60 mL/min (ref >60)

## 2023-01-18 LAB — GLUCOSE, CAPILLARY
Glucose-Capillary: 143 mg/dL — ABNORMAL HIGH (ref 70–99)
Glucose-Capillary: 187 mg/dL — ABNORMAL HIGH (ref 70–99)

## 2023-01-18 LAB — BUN: BUN: 8 mg/dL (ref 8–23)

## 2023-01-18 SURGERY — LOWER EXTREMITY ANGIOGRAPHY
Anesthesia: Moderate Sedation | Site: Leg Lower | Laterality: Left

## 2023-01-18 MED ORDER — LABETALOL HCL 5 MG/ML IV SOLN: 10.0000 mg | INTRAVENOUS | Status: DC | PRN

## 2023-01-18 MED ORDER — HYDROMORPHONE HCL 1 MG/ML IJ SOLN: 1.0000 mg | Freq: Once | INTRAMUSCULAR | Status: DC | PRN

## 2023-01-18 MED ORDER — HEPARIN SODIUM (PORCINE) 1000 UNIT/ML IJ SOLN
INTRAMUSCULAR | Status: AC
  Filled 2023-01-18: qty 10

## 2023-01-18 MED ORDER — FAMOTIDINE 20 MG PO TABS: 40.0000 mg | ORAL_TABLET | Freq: Once | ORAL | Status: DC | PRN

## 2023-01-18 MED ORDER — CEFAZOLIN SODIUM-DEXTROSE 1-4 GM/50ML-% IV SOLN
INTRAVENOUS | Status: DC | PRN
  Administered 2023-01-18: 2 g via INTRAVENOUS

## 2023-01-18 MED ORDER — SODIUM CHLORIDE 0.9% FLUSH: 3.0000 mL | INTRAVENOUS | Status: DC | PRN

## 2023-01-18 MED ORDER — ONDANSETRON HCL 4 MG/2ML IJ SOLN: 4.0000 mg | Freq: Four times a day (QID) | INTRAMUSCULAR | Status: DC | PRN

## 2023-01-18 MED ORDER — CEFAZOLIN SODIUM-DEXTROSE 2-4 GM/100ML-% IV SOLN
INTRAVENOUS | Status: AC
  Filled 2023-01-18: qty 100

## 2023-01-18 MED ORDER — MORPHINE SULFATE (PF) 4 MG/ML IV SOLN
2.0000 mg | INTRAVENOUS | Status: DC | PRN
  Administered 2023-01-18: 2 mg via INTRAVENOUS

## 2023-01-18 MED ORDER — FENTANYL CITRATE PF 50 MCG/ML IJ SOSY
PREFILLED_SYRINGE | INTRAMUSCULAR | Status: AC
  Filled 2023-01-18: qty 1

## 2023-01-18 MED ORDER — CLOPIDOGREL BISULFATE 75 MG PO TABS: 75.0000 mg | ORAL_TABLET | Freq: Every day | ORAL | 11 refills | Status: DC

## 2023-01-18 MED ORDER — MORPHINE SULFATE (PF) 2 MG/ML IV SOLN
INTRAVENOUS | Status: AC
  Filled 2023-01-18: qty 1

## 2023-01-18 MED ORDER — SODIUM CHLORIDE 0.9 % IV SOLN: INTRAVENOUS | Status: DC

## 2023-01-18 MED ORDER — IODIXANOL 320 MG/ML IV SOLN
INTRAVENOUS | Status: DC | PRN
  Administered 2023-01-18: 65 mL

## 2023-01-18 MED ORDER — HYDRALAZINE HCL 20 MG/ML IJ SOLN: 5.0000 mg | INTRAMUSCULAR | Status: DC | PRN

## 2023-01-18 MED ORDER — METHYLPREDNISOLONE SODIUM SUCC 125 MG IJ SOLR: 125.0000 mg | Freq: Once | INTRAMUSCULAR | Status: DC | PRN

## 2023-01-18 MED ORDER — SODIUM CHLORIDE 0.9% FLUSH: 3.0000 mL | Freq: Two times a day (BID) | INTRAVENOUS | Status: DC

## 2023-01-18 MED ORDER — OXYCODONE HCL 5 MG PO TABS: 5.0000 mg | ORAL_TABLET | ORAL | Status: DC | PRN

## 2023-01-18 MED ORDER — MIDAZOLAM HCL 5 MG/5ML IJ SOLN
INTRAMUSCULAR | Status: AC
  Filled 2023-01-18: qty 5

## 2023-01-18 MED ORDER — HEPARIN SODIUM (PORCINE) 1000 UNIT/ML IJ SOLN
INTRAMUSCULAR | Status: DC | PRN
  Administered 2023-01-18: 6000 [IU] via INTRAVENOUS

## 2023-01-18 MED ORDER — ACETAMINOPHEN 325 MG PO TABS: 650.0000 mg | ORAL_TABLET | ORAL | Status: DC | PRN

## 2023-01-18 MED ORDER — CEFAZOLIN SODIUM-DEXTROSE 2-4 GM/100ML-% IV SOLN: 2.0000 g | INTRAVENOUS | Status: DC

## 2023-01-18 MED ORDER — MIDAZOLAM HCL 2 MG/2ML IJ SOLN
INTRAMUSCULAR | Status: DC | PRN
  Administered 2023-01-18 (×2): 1 mg via INTRAVENOUS
  Administered 2023-01-18: 2 mg via INTRAVENOUS

## 2023-01-18 MED ORDER — SODIUM CHLORIDE 0.9 % IV SOLN: 250.0000 mL | INTRAVENOUS | Status: DC | PRN

## 2023-01-18 MED ORDER — FENTANYL CITRATE (PF) 100 MCG/2ML IJ SOLN
INTRAMUSCULAR | Status: AC
  Filled 2023-01-18: qty 2

## 2023-01-18 MED ORDER — FENTANYL CITRATE (PF) 100 MCG/2ML IJ SOLN
INTRAMUSCULAR | Status: DC | PRN
  Administered 2023-01-18 (×2): 50 ug via INTRAVENOUS
  Administered 2023-01-18: 25 ug via INTRAVENOUS

## 2023-01-18 MED ORDER — MIDAZOLAM HCL 2 MG/ML PO SYRP: 8.0000 mg | ORAL_SOLUTION | Freq: Once | ORAL | Status: DC | PRN

## 2023-01-18 MED ORDER — DIPHENHYDRAMINE HCL 50 MG/ML IJ SOLN: 50.0000 mg | Freq: Once | INTRAMUSCULAR | Status: DC | PRN

## 2023-01-18 SURGICAL SUPPLY — 27 items
BALLN LUTONIX 018 4X100X130 (BALLOONS) ×2
BALLN LUTONIX 018 4X80X130 (BALLOONS) ×1
BALLN LUTONIX 018 5X300X130 (BALLOONS) ×1
BALLOON LUTONIX 018 4X100X130 (BALLOONS) IMPLANT
BALLOON LUTONIX 018 4X80X130 (BALLOONS) IMPLANT
BALLOON LUTONIX 018 5X300X130 (BALLOONS) IMPLANT
CATH ANGIO 5F PIGTAIL 65CM (CATHETERS) IMPLANT
CATH BEACON 5 .038 100 VERT TP (CATHETERS) IMPLANT
CATH ROTAREX 135 6FR (CATHETERS) IMPLANT
COVER PROBE ULTRASOUND 5X96 (MISCELLANEOUS) IMPLANT
DEVICE STARCLOSE SE CLOSURE (Vascular Products) IMPLANT
GLIDEWIRE ADV .035X260CM (WIRE) IMPLANT
GOWN STRL REUS W/ TWL LRG LVL3 (GOWN DISPOSABLE) ×1 IMPLANT
GOWN STRL REUS W/TWL LRG LVL3 (GOWN DISPOSABLE) ×1
KIT ENCORE 26 ADVANTAGE (KITS) IMPLANT
NDL ENTRY 21GA 7CM ECHOTIP (NEEDLE) IMPLANT
NEEDLE ENTRY 21GA 7CM ECHOTIP (NEEDLE) ×1 IMPLANT
PACK ANGIOGRAPHY (CUSTOM PROCEDURE TRAY) ×1 IMPLANT
PANNUS RETENTION SYSTEM 2 PAD (MISCELLANEOUS) IMPLANT
SET INTRO CAPELLA COAXIAL (SET/KITS/TRAYS/PACK) IMPLANT
SHEATH ANL2 6FRX45 HC (SHEATH) IMPLANT
SHEATH BRITE TIP 5FRX11 (SHEATH) IMPLANT
STENT LIFESTENT 5F 6X100X135 (Permanent Stent) IMPLANT
SYR MEDRAD MARK 7 150ML (SYRINGE) IMPLANT
TUBING CONTRAST HIGH PRESS 72 (TUBING) IMPLANT
WIRE G V18X300CM (WIRE) IMPLANT
WIRE GUIDERIGHT .035X150 (WIRE) IMPLANT

## 2023-01-18 NOTE — Op Note (Signed)
[facility]  Percutaneous Study/Intervention Procedural Note   Date of Surgery: 01/18/2023  Surgeon:  Katha Cabal, MD.  Pre-operative Diagnosis: Atherosclerotic occlusive disease bilateral lower extremities with rest pain of the left lower extremity; thrombosis of previously placed stent left SFA  Post-operative diagnosis:  Same  Procedure(s) Performed:             1.  Introduction catheter into left lower extremity 3rd order catheter placement              2.    Contrast injection left lower extremity for distal runoff             3.  Percutaneous transluminal angioplasty and stent placement left superficial femoral artery and popliteal             4.  Mechanical thrombectomy of the SFA and popliteal using the [place] Rex device.               5.  Star close closure right common femoral arteriotomy  Anesthesia: Conscious sedation was administered under my direct supervision by the interventional radiology RN. IV Versed plus fentanyl were utilized. Continuous ECG, pulse oximetry and blood pressure was monitored throughout the entire procedure.  Conscious sedation was for a total of 1 hour 2 minutes 58 seconds.  Sheath: 6 [place] Ansell right common femoral retrograde  Contrast: 65 cc  Fluoroscopy Time: 9.4 minutes  Indications:  Kristie Olson presents with increasing pain of her left lower extremity.  Noninvasive studies as well as physical examination demonstrated a marked decrease in her arterial perfusion.  Duplex ultrasound is suggesting occlusion of the previously placed SFA stent.  Given the patient's increasing pain and debility this places her left limb at risk.  The risks and benefits for angiography with intervention are reviewed all questions answered patient agrees to proceed.  Procedure:  Kristie Olson is a 67 y.o. y.o. female who was identified and appropriate procedural time out was performed.  The patient was then placed supine on the table  and prepped and draped in the usual sterile fashion.    Ultrasound was placed in the sterile sleeve and the right groin was evaluated the right common femoral artery was echolucent and pulsatile indicating patency.  Image was recorded for the permanent record and under real-time visualization a microneedle was inserted into the common femoral artery microwire followed by a micro-sheath.  A J-wire was then advanced through the micro-sheath and a  5 [place] sheath was then inserted over a J-wire. J-wire was then advanced and a 5 French pigtail catheter was positioned at the level of T12. AP projection of the aorta was then obtained. Pigtail catheter was repositioned to above the bifurcation and a RAO view of the pelvis was obtained.  Subsequently a pigtail catheter with the stiff angle Glidewire was used to cross the aortic bifurcation the catheter wire were advanced down into the left distal external iliac artery. Oblique view of the femoral bifurcation was then obtained and subsequently the wire was reintroduced and the pigtail catheter negotiated into the SFA representing third order catheter placement. Distal runoff was then performed.  6000 units of heparin was then given and allowed to circulate and a 6 [place] Ansell sheath was advanced up and over the bifurcation and positioned in the femoral artery  KMP  catheter and stiff angle Glidewire were then negotiated down into the distal popliteal.  Hand-injection contrast verified intraluminal positioning.  A V18 wire was  then introduced under fluoroscopic guidance.  The Green[place] Rex catheter was then prepped on the field advanced over the 0.018 wire beginning at the origin of the SFA mechanical thrombectomy was performed all the way down to the mid popliteal.  Follow-up imaging now demonstrated patency of the entire SFA and popliteal however there were multiple areas with greater than 70% residual stenosis particularly at the leading edge of the stent, the distal  edge of the stent and in the mid popliteal.  The V18 wire was then used to advance a 4 mm x 100 Lutonix drug-eluting balloon was used to angioplasty the origin of the superficial femoral artery and the proximal portion of the stent.  Next a 5 mm x 300 mm Lutonix drug-eluting balloon was used to treat the entire length of the existing stent.  Inflations were to 10 atmospheres for 1 minute. Follow-up imaging demonstrated patency with less than 10% residual stenosis except in the mid popliteal at the level of the femoral condyles there appeared to be greater then 60% stenosis.  I suspect this was one of the reasons why the original stent thrombosed.  A 4 mm x 80 mm Lutonix drug-eluting balloon was then advanced across this lesion inflated to 10 atm for 1 minute.  Follow-up imaging demonstrated greater than 50% residual stenosis in the midportion and I elected to stent this area.  A 6 mm x 100 mm life stent was then deployed extending the existing stents down to the mid popliteal at the level of the femoral condyles.  Subsequently, a 4 mm x 100 mm Lutonix balloon was utilized inflating to 10 atm for 1 minute.  Distal runoff was then reassessed and found to be widely patent.  After review of these images the sheath is pulled into the right external iliac oblique of the common femoral is obtained and a Star close device deployed. There no immediate complications.   Findings:  The abdominal aorta is opacified with a bolus injection contrast. Renal arteries are single and widely patent. The aorta itself has diffuse disease but no hemodynamically significant lesions it is rather on the small side. The common and external iliac arteries are widely patent bilaterally.  The left common femoral is widely patent as is the profunda femoris.  The SFA does indeed occlude shortly after its origin.  Previously placed stents are occluded.  The at knee popliteal is reconstituted.  Distally the popliteal is patent without  hemodynamically significant stenosis trifurcation is patent.  The anterior tibial and posterior tibial are of approximately equal size and both patent down to the foot filling the pedal arch.  Peroneal is quite small but appears patent.  Following angioplasty the proximal portion of the SFA is now widely patent.  Following angioplasty through the stent itself with a 5 mm balloon there is less than 10% residual stenosis.  Following angioplasty of the midportion of the popliteal distal to the previously placed stents there is greater than 50% residual stenosis and therefore life stent is deployed and postdilated with a 4 mm balloon.  Following this there is less than 10% residual stenosis.  Three-vessel runoff is maintained.    Summary: Successful recanalization left lower extremity for limb salvage                        Disposition: Patient was taken to the recovery room in stable condition having tolerated the procedure well.  Kristie Olson, Dolores Lory 01/18/2023,12:54 PM

## 2023-01-19 ENCOUNTER — Encounter: Payer: Self-pay | Admitting: Vascular Surgery

## 2023-01-23 NOTE — Interval H&P Note (Signed)
History and Physical Interval Note:  01/23/2023 4:36 PM  Kristie Olson  has presented today for surgery, with the diagnosis of LLE Angio   BARD    ASO w rest pain.  The various methods of treatment have been discussed with the patient and family. After consideration of risks, benefits and other options for treatment, the patient has consented to  Procedure(s): Lower Extremity Angiography (Left) as a surgical intervention.  The patient's history has been reviewed, patient examined, no change in status, stable for surgery.  I have reviewed the patient's chart and labs.  Questions were answered to the patient's satisfaction.     Hortencia Pilar

## 2023-01-24 ENCOUNTER — Telehealth (INDEPENDENT_AMBULATORY_CARE_PROVIDER_SITE_OTHER): Payer: Self-pay

## 2023-01-24 NOTE — Telephone Encounter (Signed)
Patient called about pain in right arm. She has been in pain since having her procedure done on 01/18/23 which was LLE angio. She states her right arm feels as if someone is constantly squeezing it. She also states her both of her legs are heavy and numb. Her left leg goes limp while she is walking. Her cane keeps her from falling. Feels like passing out because of the pain. Wants to know how long will she be in pain and is this normal?

## 2023-01-24 NOTE — Telephone Encounter (Signed)
Left a voicemail to return call also to go to the ER.

## 2023-01-24 NOTE — Telephone Encounter (Signed)
Patient returned call. A patient stated she will wait a couple of days. I asked her to go now if she could and not wait. As this matter could be serious. She as she would go today.

## 2023-01-24 NOTE — Telephone Encounter (Signed)
Her arm pain doesn't have anything to do with her angio.  She may need to be seen in the ED for evaluation because this could be a sign of another issue such as a heart attack. She has had leg pain and weakness since before her procedure due to her significant lower back issues.  I would suggest contacting her PCP or going to the ED for the arm pain

## 2023-01-31 NOTE — Progress Notes (Unsigned)
   Telephone Visit- Progress Note: Referring Physician:  Center, [facility] [place],  [place] [id number]  Primary Physician:  Center, [facility]  This visit was performed via telephone.  Patient location: home Provider location: office  I spent a total of 10 minutes non-face-to-face activities for this visit on the date of this encounter including review of current clinical condition and response to treatment.    Patient has given verbal consent to this telephone visits and we reviewed the limitations of a telephone visit. Patient wishes to proceed.    Chief Complaint:  follow up     History of Present Illness: Kristie Olson is a 67 y.o. female has a history of HTN, TIA, PAD, asthma, DM, and peripheral neuropathy.    She had angiogram with intervention to her lower extremities 09/03/22 and 09/08/22.   Last seen by me on 12/07/22 for chronic intermittent LBP with bilateral leg pain. She has known lumbar spondylosis and facet hypertrophy- this is likely cause of her LBP. She has mild central stenosis L3-L4 and some foraminal stenosis L3-L5. This may be contributing to her leg pain, but she may also have vascular component.   We discussed PT and she declined. She was referred to PMR and had bilateral L5-S1 TF ESI with Dr. Alba Destine on 12/30/22.   She had repeat angiogram with intervention to left lower extremity on 01/18/23.   She states she had improvement in her pain with above ESI. She continues with intermittent LBP and bilateral leg pain that is worse with standing/walking for longer than 2-3 minutes.   She is still smoking, but is trying to cut down.   Allergy noted to motrin and NSAIDs (GI upset).      Conservative measures:  Physical therapy: no  Multimodal medical therapy including regular antiinflammatories: tylenol  Injections:  bilateral L5-S1 TF ESI with Dr. Alba Destine on 12/30/22.    Past Surgery: no spinal surgery  Exam: No  exam done as this was a telephone encounter.     Imaging: none   Assessment and Plan: Kristie Olson is a pleasant 67 y.o. female with chronic intermittent LBP with bilateral leg pain that is worse with standing and walking.    She has known lumbar spondylosis and facet hypertrophy- this is likely cause of her LBP. She has mild central stenosis L3-L4 and some foraminal stenosis L3-L5. This may be contributing to her leg pain, but she may also have vascular component.   She did see improvement with lumbar ESI. Of note, she also and a repeat angiogram with intervention to left lower extremity on 01/18/23.   Treatment options discussed with patient and following plan made:   - She would like to repeat lumbar ESI with Dr. Alba Destine (per her note, patient can call to schedule). Message to Dr. Alba Destine to have staff reach out to patient.  - Continue to follow with vascular.  - Smoking cessation discussed and encouraged. She is working on cutting down.  - Discussed PT again. She wants to revisit after her second ESI.   - Follow up with me in 6-8 weeks and prn.   Geronimo Boot PA-C Neurosurgery

## 2023-02-01 ENCOUNTER — Encounter: Payer: Self-pay | Admitting: Orthopedic Surgery

## 2023-02-01 ENCOUNTER — Ambulatory Visit (INDEPENDENT_AMBULATORY_CARE_PROVIDER_SITE_OTHER): Payer: 59 | Admitting: Orthopedic Surgery

## 2023-02-01 DIAGNOSIS — M5416 Radiculopathy, lumbar region: Secondary | ICD-10-CM

## 2023-02-01 DIAGNOSIS — M47816 Spondylosis without myelopathy or radiculopathy, lumbar region: Secondary | ICD-10-CM | POA: Diagnosis not present

## 2023-02-03 ENCOUNTER — Other Ambulatory Visit (INDEPENDENT_AMBULATORY_CARE_PROVIDER_SITE_OTHER): Payer: Self-pay | Admitting: Vascular Surgery

## 2023-02-03 DIAGNOSIS — I70222 Atherosclerosis of native arteries of extremities with rest pain, left leg: Secondary | ICD-10-CM

## 2023-02-03 DIAGNOSIS — Z95820 Peripheral vascular angioplasty status with implants and grafts: Secondary | ICD-10-CM

## 2023-02-09 ENCOUNTER — Other Ambulatory Visit (INDEPENDENT_AMBULATORY_CARE_PROVIDER_SITE_OTHER): Payer: Self-pay | Admitting: Nurse Practitioner

## 2023-02-09 ENCOUNTER — Ambulatory Visit (INDEPENDENT_AMBULATORY_CARE_PROVIDER_SITE_OTHER): Payer: 59 | Admitting: Nurse Practitioner

## 2023-02-09 ENCOUNTER — Telehealth (INDEPENDENT_AMBULATORY_CARE_PROVIDER_SITE_OTHER): Payer: Self-pay

## 2023-02-09 ENCOUNTER — Encounter (INDEPENDENT_AMBULATORY_CARE_PROVIDER_SITE_OTHER): Payer: Self-pay | Admitting: Nurse Practitioner

## 2023-02-09 ENCOUNTER — Ambulatory Visit (INDEPENDENT_AMBULATORY_CARE_PROVIDER_SITE_OTHER): Payer: 59

## 2023-02-09 VITALS — BP 156/78 | HR 87 | Resp 16

## 2023-02-09 DIAGNOSIS — R6889 Other general symptoms and signs: Secondary | ICD-10-CM | POA: Diagnosis not present

## 2023-02-09 DIAGNOSIS — E0865 Diabetes mellitus due to underlying condition with hyperglycemia: Secondary | ICD-10-CM | POA: Diagnosis not present

## 2023-02-09 DIAGNOSIS — Z9889 Other specified postprocedural states: Secondary | ICD-10-CM | POA: Diagnosis not present

## 2023-02-09 DIAGNOSIS — Z95820 Peripheral vascular angioplasty status with implants and grafts: Secondary | ICD-10-CM | POA: Diagnosis not present

## 2023-02-09 DIAGNOSIS — I10 Essential (primary) hypertension: Secondary | ICD-10-CM | POA: Diagnosis not present

## 2023-02-09 DIAGNOSIS — I70222 Atherosclerosis of native arteries of extremities with rest pain, left leg: Secondary | ICD-10-CM

## 2023-02-09 DIAGNOSIS — I739 Peripheral vascular disease, unspecified: Secondary | ICD-10-CM | POA: Diagnosis not present

## 2023-02-09 LAB — VAS US ABI WITH/WO TBI
Left ABI: 0.61
Right ABI: 1.03

## 2023-02-09 MED ORDER — OXYCODONE-ACETAMINOPHEN 5-325 MG PO TABS: 1.0000 | ORAL_TABLET | Freq: Four times a day (QID) | ORAL | 0 refills | Status: AC | PRN

## 2023-02-09 NOTE — Telephone Encounter (Signed)
Spoke with the patient and she is scheduled with Dr. Lucky Cowboy on 02/14/23 for a LLE angio with a 12:30 pm arrival time to the [facility]. Pre-procedure instructions were discussed and patient stated she understood and will be mailed.

## 2023-02-14 ENCOUNTER — Encounter (INDEPENDENT_AMBULATORY_CARE_PROVIDER_SITE_OTHER): Payer: Self-pay | Admitting: Nurse Practitioner

## 2023-02-14 ENCOUNTER — Encounter: Payer: Self-pay | Admitting: Certified Registered"

## 2023-02-14 ENCOUNTER — Encounter
Admission: RE | Disposition: A | Discharge status: Home / Self Care | Payer: Self-pay | Source: Home / Self Care | Attending: Vascular Surgery

## 2023-02-14 ENCOUNTER — Other Ambulatory Visit: Payer: Self-pay

## 2023-02-14 ENCOUNTER — Ambulatory Visit
Admission: RE | Admit: 2023-02-14 | Discharge: 2023-02-14 | Disposition: A | Discharge status: Home / Self Care | Payer: 59 | Attending: Vascular Surgery | Admitting: Vascular Surgery

## 2023-02-14 ENCOUNTER — Encounter: Payer: Self-pay | Admitting: Vascular Surgery

## 2023-02-14 DIAGNOSIS — I70222 Atherosclerosis of native arteries of extremities with rest pain, left leg: Secondary | ICD-10-CM

## 2023-02-14 DIAGNOSIS — Z7984 Long term (current) use of oral hypoglycemic drugs: Secondary | ICD-10-CM | POA: Insufficient documentation

## 2023-02-14 DIAGNOSIS — I70229 Atherosclerosis of native arteries of extremities with rest pain, unspecified extremity: Secondary | ICD-10-CM

## 2023-02-14 DIAGNOSIS — I10 Essential (primary) hypertension: Secondary | ICD-10-CM | POA: Diagnosis not present

## 2023-02-14 DIAGNOSIS — Z794 Long term (current) use of insulin: Secondary | ICD-10-CM | POA: Insufficient documentation

## 2023-02-14 DIAGNOSIS — E1151 Type 2 diabetes mellitus with diabetic peripheral angiopathy without gangrene: Secondary | ICD-10-CM | POA: Diagnosis not present

## 2023-02-14 DIAGNOSIS — F17210 Nicotine dependence, cigarettes, uncomplicated: Secondary | ICD-10-CM | POA: Insufficient documentation

## 2023-02-14 DIAGNOSIS — Z9889 Other specified postprocedural states: Secondary | ICD-10-CM | POA: Diagnosis not present

## 2023-02-14 DIAGNOSIS — Z95828 Presence of other vascular implants and grafts: Secondary | ICD-10-CM | POA: Diagnosis not present

## 2023-02-14 DIAGNOSIS — Z7985 Long-term (current) use of injectable non-insulin antidiabetic drugs: Secondary | ICD-10-CM | POA: Diagnosis not present

## 2023-02-14 DIAGNOSIS — E1165 Type 2 diabetes mellitus with hyperglycemia: Secondary | ICD-10-CM | POA: Insufficient documentation

## 2023-02-14 DIAGNOSIS — I743 Embolism and thrombosis of arteries of the lower extremities: Secondary | ICD-10-CM | POA: Diagnosis not present

## 2023-02-14 HISTORY — PX: LOWER EXTREMITY ANGIOGRAPHY: CATH118251

## 2023-02-14 LAB — URINALYSIS, ROUTINE W REFLEX MICROSCOPIC
Bacteria, UA: NONE SEEN
Bilirubin Urine: NEGATIVE
Glucose, UA: NEGATIVE mg/dL
Ketones, ur: NEGATIVE mg/dL
Nitrite: POSITIVE — AB
Protein, ur: 100 mg/dL — AB
RBC / HPF: >50 RBC/hpf (ref 0–5)
Specific Gravity, Urine: >1.046 — ABNORMAL HIGH (ref 1.005–1.030)
Squamous Epithelial / HPF: NONE SEEN /HPF (ref 0–5)
WBC, UA: >50 WBC/hpf (ref 0–5)
pH: 5 (ref 5.0–8.0)

## 2023-02-14 LAB — GLUCOSE, CAPILLARY
Glucose-Capillary: 104 mg/dL — ABNORMAL HIGH (ref 70–99)
Glucose-Capillary: 138 mg/dL — ABNORMAL HIGH (ref 70–99)

## 2023-02-14 LAB — BUN: BUN: 8 mg/dL (ref 8–23)

## 2023-02-14 LAB — CREATININE, SERUM
Creatinine, Ser: 0.66 mg/dL (ref 0.44–1.00)
GFR, Estimated: >60 mL/min (ref >60)

## 2023-02-14 SURGERY — LOWER EXTREMITY ANGIOGRAPHY
Anesthesia: Moderate Sedation | Site: Leg Lower | Laterality: Left

## 2023-02-14 MED ORDER — SODIUM CHLORIDE 0.9% FLUSH: 3.0000 mL | Freq: Two times a day (BID) | INTRAVENOUS | Status: DC

## 2023-02-14 MED ORDER — MIDAZOLAM HCL 2 MG/ML PO SYRP: 8.0000 mg | ORAL_SOLUTION | Freq: Once | ORAL | Status: DC | PRN

## 2023-02-14 MED ORDER — ONDANSETRON HCL 4 MG/2ML IJ SOLN: 4.0000 mg | Freq: Four times a day (QID) | INTRAMUSCULAR | Status: DC | PRN

## 2023-02-14 MED ORDER — FENTANYL CITRATE (PF) 100 MCG/2ML IJ SOLN
INTRAMUSCULAR | Status: AC
  Filled 2023-02-14: qty 2

## 2023-02-14 MED ORDER — SODIUM CHLORIDE 0.9 % IV SOLN: INTRAVENOUS | Status: DC

## 2023-02-14 MED ORDER — CIPROFLOXACIN IN D5W 400 MG/200ML IV SOLN
INTRAVENOUS | Status: AC
  Administered 2023-02-14: 400 mg
  Filled 2023-02-14: qty 200

## 2023-02-14 MED ORDER — MIDAZOLAM HCL 2 MG/2ML IJ SOLN
INTRAMUSCULAR | Status: AC
  Filled 2023-02-14: qty 2

## 2023-02-14 MED ORDER — CEFAZOLIN SODIUM-DEXTROSE 2-4 GM/100ML-% IV SOLN: 2.0000 g | INTRAVENOUS | Status: AC

## 2023-02-14 MED ORDER — MIDAZOLAM HCL 2 MG/2ML IJ SOLN
INTRAMUSCULAR | Status: DC | PRN
  Administered 2023-02-14 (×2): 1 mg via INTRAVENOUS
  Administered 2023-02-14: 2 mg via INTRAVENOUS

## 2023-02-14 MED ORDER — IODIXANOL 320 MG/ML IV SOLN
INTRAVENOUS | Status: DC | PRN
  Administered 2023-02-14: 50 mL via INTRA_ARTERIAL

## 2023-02-14 MED ORDER — HYDRALAZINE HCL 20 MG/ML IJ SOLN: 5.0000 mg | INTRAMUSCULAR | Status: DC | PRN

## 2023-02-14 MED ORDER — LABETALOL HCL 5 MG/ML IV SOLN: 10.0000 mg | INTRAVENOUS | Status: DC | PRN

## 2023-02-14 MED ORDER — HEPARIN SODIUM (PORCINE) 1000 UNIT/ML IJ SOLN
INTRAMUSCULAR | Status: AC
  Filled 2023-02-14: qty 10

## 2023-02-14 MED ORDER — CEFAZOLIN SODIUM-DEXTROSE 2-4 GM/100ML-% IV SOLN
INTRAVENOUS | Status: AC
  Administered 2023-02-14: 2 g via INTRAVENOUS
  Filled 2023-02-14: qty 100

## 2023-02-14 MED ORDER — METHYLPREDNISOLONE SODIUM SUCC 125 MG IJ SOLR: 125.0000 mg | Freq: Once | INTRAMUSCULAR | Status: DC | PRN

## 2023-02-14 MED ORDER — HEPARIN SODIUM (PORCINE) 1000 UNIT/ML IJ SOLN
INTRAMUSCULAR | Status: DC | PRN
  Administered 2023-02-14: 5000 [IU] via INTRAVENOUS

## 2023-02-14 MED ORDER — FAMOTIDINE 20 MG PO TABS: 40.0000 mg | ORAL_TABLET | Freq: Once | ORAL | Status: DC | PRN

## 2023-02-14 MED ORDER — SODIUM CHLORIDE 0.9 % IV SOLN: 250.0000 mL | INTRAVENOUS | Status: DC | PRN

## 2023-02-14 MED ORDER — SODIUM CHLORIDE 0.9% FLUSH: 3.0000 mL | INTRAVENOUS | Status: DC | PRN

## 2023-02-14 MED ORDER — FENTANYL CITRATE (PF) 100 MCG/2ML IJ SOLN
INTRAMUSCULAR | Status: DC | PRN
  Administered 2023-02-14 (×3): 50 ug via INTRAVENOUS

## 2023-02-14 MED ORDER — DIPHENHYDRAMINE HCL 50 MG/ML IJ SOLN: 50.0000 mg | Freq: Once | INTRAMUSCULAR | Status: DC | PRN

## 2023-02-14 MED ORDER — ACETAMINOPHEN 325 MG PO TABS: 650.0000 mg | ORAL_TABLET | ORAL | Status: DC | PRN

## 2023-02-14 MED ORDER — HYDROMORPHONE HCL 1 MG/ML IJ SOLN: 1.0000 mg | Freq: Once | INTRAMUSCULAR | Status: DC | PRN

## 2023-02-14 MED ORDER — CIPROFLOXACIN IN D5W 400 MG/200ML IV SOLN: 400.0000 mg | Freq: Once | INTRAVENOUS | Status: AC

## 2023-02-14 SURGICAL SUPPLY — 22 items
BALLN LUTONIX 018 5X100X130 (BALLOONS) ×1
BALLN LUTONIX 018 5X300X130 (BALLOONS) ×1
BALLN LUTONIX 018 6X60X130 (BALLOONS) ×1
BALLOON LUTONIX 018 5X100X130 (BALLOONS) IMPLANT
BALLOON LUTONIX 018 5X300X130 (BALLOONS) IMPLANT
BALLOON LUTONIX 018 6X60X130 (BALLOONS) IMPLANT
CATH ANGIO 5F PIGTAIL 65CM (CATHETERS) IMPLANT
CATH ROTAREX 135 6FR (CATHETERS) IMPLANT
CATH VERT 5X100 (CATHETERS) IMPLANT
DEVICE STARCLOSE SE CLOSURE (Vascular Products) IMPLANT
GLIDEWIRE ADV .035X260CM (WIRE) IMPLANT
GUIDEWIRE SUPER STIFF .035X180 (WIRE) IMPLANT
KIT ENCORE 26 ADVANTAGE (KITS) IMPLANT
PACK ANGIOGRAPHY (CUSTOM PROCEDURE TRAY) ×1 IMPLANT
SHEATH BRITE TIP 4FRX11 (SHEATH) IMPLANT
SHEATH BRITE TIP 5FRX11 (SHEATH) IMPLANT
SHEATH PINNACLE MP 6F 45CM (SHEATH) IMPLANT
STENT VIABAHN 6X150X120 (Permanent Stent) IMPLANT
SYR MEDRAD MARK 7 150ML (SYRINGE) IMPLANT
TUBING CONTRAST HIGH PRESS 72 (TUBING) IMPLANT
WIRE G V18X300CM (WIRE) IMPLANT
WIRE GUIDERIGHT .035X150 (WIRE) IMPLANT

## 2023-02-14 NOTE — Progress Notes (Signed)
Subjective:    Patient ID: Kristie Olson, female    DOB: December 22, 1955, 67 y.o.   MRN: [id number] Chief Complaint  Patient presents with   Follow-up    [facility] 3 week with ABI    The patient returns to the office for followup and review status post angiogram with intervention on 01/18/2023.   Procedure: Procedure(s) Performed:             1.  Introduction catheter into left lower extremity 3rd order catheter placement              2.    Contrast injection left lower extremity for distal runoff             3.  Percutaneous transluminal angioplasty and stent placement left superficial femoral artery and popliteal             4.  Mechanical thrombectomy of the SFA and popliteal using the [place] Rex device.               5.  Star close closure right common femoral arteriotomy   The patient notes no improvement in the lower extremity symptoms.  She notes that maybe for a day or so it seems to be better and suddenly her walking is much worse than it has been previously.  She does have some known history of lower back disease and is scheduled for upcoming shots.  She notes that her leg is numb and painful.  Notes that she has been taking her aspirin and Plavix.  She continues to smoke.   There have been no significant changes to the patient's overall health care.  No documented history of amaurosis fugax or recent TIA symptoms. There are no recent neurological changes noted. No documented history of DVT, PE or superficial thrombophlebitis. The patient denies recent episodes of angina or shortness of breath.   ABI's Rt=1.03 and Lt=0.61  (previous ABI's Rt=1.03 and Lt=0.59) Left lower extremity arterial duplex shows occlusion of the previously placed stent in the left SFA.    Review of Systems  Musculoskeletal:  Positive for back pain and gait problem.  Neurological:  Positive for weakness and numbness.  All other systems reviewed and are negative.      Objective:   Physical  Exam Vitals reviewed.  HENT:     Head: Normocephalic.  Cardiovascular:     Rate and Rhythm: Normal rate.     Pulses:          Dorsalis pedis pulses are detected w/ Doppler on the right side and detected w/ Doppler on the left side.       Posterior tibial pulses are detected w/ Doppler on the right side and detected w/ Doppler on the left side.  Pulmonary:     Effort: Pulmonary effort is normal.  Skin:    General: Skin is warm and dry.  Neurological:     Mental Status: She is alert and oriented to person, place, and time.     Motor: Weakness present.     Gait: Gait abnormal.  Psychiatric:        Mood and Affect: Mood normal.        Behavior: Behavior normal.        Thought Content: Thought content normal.        Judgment: Judgment normal.     BP (!) 156/78 (BP Location: Right Arm)   Pulse 87   Resp 16   Past Medical History:  Diagnosis Date  Asthma    Coronary artery disease    Diabetes mellitus without complication 0000000   Fibromyalgia    Hypertension    PAD (peripheral artery disease) 09/03/2022    Social History   Socioeconomic History   Marital status: Married    Spouse name: Not on file   Number of children: Not on file   Years of education: Not on file   Highest education level: Not on file  Occupational History   Not on file  Tobacco Use   Smoking status: Every Day    Packs/day: 1.00    Years: 46.00    Additional pack years: 0.00    Total pack years: 46.00    Types: Cigarettes   Smokeless tobacco: Never  Vaping Use   Vaping Use: Never used  Substance and Sexual Activity   Alcohol use: No   Drug use: Not on file   Sexual activity: Not on file  Other Topics Concern   Not on file  Social History Narrative   Not on file   Social Determinants of Health   Financial Resource Strain: Not on file  Food Insecurity: No Food Insecurity (09/03/2022)   Hunger Vital Sign    Worried About Running Out of Food in the Last Year: Never true    Ran Out of  Food in the Last Year: Never true  Transportation Needs: No Transportation Needs (09/03/2022)   PRAPARE - Hydrologist (Medical): No    Lack of Transportation (Non-Medical): No  Physical Activity: Not on file  Stress: Not on file  Social Connections: Not on file  Intimate Partner Violence: Not At Risk (09/03/2022)   Humiliation, Afraid, Rape, and Kick questionnaire    Fear of Current or Ex-Partner: No    Emotionally Abused: No    Physically Abused: No    Sexually Abused: No    Past Surgical History:  Procedure Laterality Date   BREAST EXCISIONAL BIOPSY Left 1980's   neg   BREAST MASS EXCISION Left    age 56"s, benign   CESAREAN SECTION     CHOLECYSTECTOMY  2009   LOWER EXTREMITY ANGIOGRAPHY Left 09/03/2022   Procedure: Lower Extremity Angiography;  Surgeon: Algernon Huxley, MD;  Location: [facility];  Service: Cardiovascular;  Laterality: Left;   LOWER EXTREMITY ANGIOGRAPHY Right 09/08/2022   Procedure: Lower Extremity Angiography;  Surgeon: Algernon Huxley, MD;  Location: [facility];  Service: Cardiovascular;  Laterality: Right;   LOWER EXTREMITY ANGIOGRAPHY Left 01/18/2023   Procedure: Lower Extremity Angiography;  Surgeon: Katha Cabal, MD;  Location: [facility];  Service: Cardiovascular;  Laterality: Left;    Family History  Problem Relation Age of Onset   Breast cancer Sister 11   Breast cancer Paternal Aunt     Allergies  Allergen Reactions   Ibuprofen Other (See Comments)    STOMACHACHE   Nsaids     Other reaction(s): Other (See Comments) PUD   Percocet [Oxycodone-Acetaminophen] Itching    Patient states "Percocet makes me itch all over"       Latest Ref Rng & Units 09/09/2022    5:31 AM 09/08/2022    4:47 AM 09/07/2022    4:24 AM  CBC  WBC 4.0 - 10.5 K/uL 9.2  7.4  9.1   Hemoglobin 12.0 - 15.0 g/dL 12.3  13.0  12.6   Hematocrit 36.0 - 46.0 % 36.2  37.9  37.0   Platelets 150 - 400 K/uL  438  420   385       CMP     Component Value Date/Time   NA 134 (L) 09/09/2022 0531   NA 132 (L) 07/14/2013 2143   K 4.2 09/09/2022 0531   K 4.3 07/14/2013 2143   CL 98 09/09/2022 0531   CL 95 (L) 07/14/2013 2143   CO2 28 09/09/2022 0531   CO2 31 07/14/2013 2143   GLUCOSE 224 (H) 09/09/2022 0531   GLUCOSE 433 (H) 07/14/2013 2143   BUN 8 01/18/2023 1052   BUN 12 07/14/2013 2143   CREATININE 0.77 01/18/2023 1052   CREATININE 0.91 07/14/2013 2143   CALCIUM 8.9 09/09/2022 0531   CALCIUM 9.3 07/14/2013 2143   PROT 8.2 (H) 04/28/2020 0423   PROT 7.9 07/14/2013 2143   ALBUMIN 4.3 04/28/2020 0423   ALBUMIN 3.7 07/14/2013 2143   AST 17 04/28/2020 0423   AST 16 07/14/2013 2143   ALT 14 04/28/2020 0423   ALT 20 07/14/2013 2143   ALKPHOS 89 04/28/2020 0423   ALKPHOS 118 07/14/2013 2143   BILITOT 0.6 04/28/2020 0423   BILITOT 0.3 07/14/2013 2143   GFRNONAA >60 01/18/2023 1052   GFRNONAA >60 07/14/2013 2143   GFRAA >60 07/01/2020 1636   GFRAA >60 07/14/2013 2143     VAS [place] ABI WITH/WO TBI  Result Date: 02/09/2023  LOWER EXTREMITY DOPPLER STUDY Patient Name:  Kristie Olson  Date of Exam:   02/09/2023 Medical Rec #: [id number]        Accession #:    [id number] Date of Birth: [birth year]       Patient Gender: F Patient Age:   17 years Exam Location:  [facility] Procedure:      VAS [place] ABI WITH/WO TBI Referring Phys: [name] --------------------------------------------------------------------------------  Indications: Peripheral artery disease.  Vascular Interventions: 01/18/2023 Percutaneous transluminal angioplasty and stent                         placement left superficial femoral artery and popliteal                         4. Mechanical thrombectomy of the SFA and popliteal                         using the [place] Rex device.                         Percutaneous transluminal angioplasty of left SFA and                         proximal popliteal artery with a pair 4 mm diameter                          Lutonix drug-coated angioplasty balloons                         5. Stent placement to the left SFA with 6 mm diameter by                         25 cm length Viabahn stent                         Percutaneous transluminal angioplasty of  right SFA and                         proximal popliteal artery with 4 mm diameter by 30 cm                         length Lutonix drug-coated angioplasty balloon                         5. Viabahn stent placement x2 to the right SFA and                         proximal popliteal artery with 6 mm diameter by 25 cm                         length and 6 mm diameter by 10 cm length Viabahn stents. Performing Technologist: Concha Norway RVT  Examination Guidelines: A complete evaluation includes at minimum, Doppler waveform signals and systolic blood pressure reading at the level of bilateral brachial, anterior tibial, and posterior tibial arteries, when vessel segments are accessible. Bilateral testing is considered an integral part of a complete examination. Photoelectric Plethysmograph (PPG) waveforms and toe systolic pressure readings are included as required and additional duplex testing as needed. Limited examinations for reoccurring indications may be performed as noted.  ABI Findings: +---------+------------------+-----+---------+--------+ Right    Rt Pressure (mmHg)IndexWaveform Comment  +---------+------------------+-----+---------+--------+ Brachial 145                                      +---------+------------------+-----+---------+--------+ ATA      153               1.02 triphasic         +---------+------------------+-----+---------+--------+ PTA      154               1.03 triphasic         +---------+------------------+-----+---------+--------+ Great Toe126               0.84 Normal            +---------+------------------+-----+---------+--------+ +---------+------------------+-----+----------+-------+ Left     Lt  Pressure (mmHg)IndexWaveform  Comment +---------+------------------+-----+----------+-------+ Brachial 150                                      +---------+------------------+-----+----------+-------+ ATA      74                0.49 monophasic        +---------+------------------+-----+----------+-------+ PTA      88                0.59 monophasic        +---------+------------------+-----+----------+-------+ DP       88                0.59                   +---------+------------------+-----+----------+-------+ Great Toe46                0.31 Abnormal          +---------+------------------+-----+----------+-------+ +-------+-----------+-----------+------------+------------+ ABI/TBIToday's ABIToday's TBIPrevious ABIPrevious TBI +-------+-----------+-----------+------------+------------+ Right  1.03       .  95        1.03        .84          +-------+-----------+-----------+------------+------------+ Left   .61        .28        .59         .31          +-------+-----------+-----------+------------+------------+ Bilateral ABIs and TBIs appear essentially unchanged compared to prior study on 01/04/2023.  Summary: Right: Resting right ankle-brachial index is within normal range. The right toe-brachial index is normal. Left: Resting left ankle-brachial index indicates moderate left lower extremity arterial disease. The left toe-brachial index is abnormal. Duplex comfirms left SFA stents occluded again s/p thrombectomy of 01/18/2023. *See table(s) above for measurements and observations.  Electronically signed by [name] MD on 02/09/2023 at 4:29:33 PM.    Final    VAS [place] ABI WITH/WO TBI  Result Date: 01/05/2023  LOWER EXTREMITY DOPPLER STUDY Patient Name:  Kristie Olson  Date of Exam:   01/04/2023 Medical Rec #: [id number]        Accession #:    [id number] Date of Birth: 26-Mar-1956       Patient Gender: F Patient Age:   89 years Exam Location:  [facility]  Procedure:      VAS [place] ABI WITH/WO TBI Referring Phys: --------------------------------------------------------------------------------  Indications: Peripheral artery disease.  Vascular Interventions: Percutaneous transluminal angioplasty of left SFA and                         proximal popliteal artery with a pair 4 mm diameter                         Lutonix drug-coated angioplasty balloons                         5. Stent placement to the left SFA with 6 mm diameter by                         25 cm length Viabahn stent                         Percutaneous transluminal angioplasty of right SFA and                         proximal popliteal artery with 4 mm diameter by 30 cm                         length Lutonix drug-coated angioplasty balloon                         5. Viabahn stent placement x2 to the right SFA and                         proximal popliteal artery with 6 mm diameter by 25 cm                         length and 6 mm diameter by 10 cm length Viabahn stents. Performing Technologist: Concha Norway RVT  Examination Guidelines: A complete evaluation includes at minimum, Doppler waveform signals and systolic blood pressure reading at the level of  bilateral brachial, anterior tibial, and posterior tibial arteries, when vessel segments are accessible. Bilateral testing is considered an integral part of a complete examination. Photoelectric Plethysmograph (PPG) waveforms and toe systolic pressure readings are included as required and additional duplex testing as needed. Limited examinations for reoccurring indications may be performed as noted.  ABI Findings: +---------+------------------+-----+--------+--------+ Right    Rt Pressure (mmHg)IndexWaveformComment  +---------+------------------+-----+--------+--------+ Brachial 145                                     +---------+------------------+-----+--------+--------+ ATA      153               1.03 biphasic          +---------+------------------+-----+--------+--------+ PTA      154               1.03 biphasic         +---------+------------------+-----+--------+--------+ [place]               0.92 Normal           +---------+------------------+-----+--------+--------+ +---------+------------------+-----+----------+-------+ Left     Lt Pressure (mmHg)IndexWaveform  Comment +---------+------------------+-----+----------+-------+ Brachial 149                                      +---------+------------------+-----+----------+-------+ ATA      91                0.61 monophasic        +---------+------------------+-----+----------+-------+ PTA      87                0.58 monophasic        +---------+------------------+-----+----------+-------+ Great Toe42                0.28 Abnormal          +---------+------------------+-----+----------+-------+ +-------+-----------+-----------+------------+------------+ ABI/TBIToday's ABIToday's TBIPrevious ABIPrevious TBI +-------+-----------+-----------+------------+------------+ Right  1.03       .92        1.11        .66          +-------+-----------+-----------+------------+------------+ Left   .61        .28        1.07        .79          +-------+-----------+-----------+------------+------------+ Left ABIs and TBIs appear decreased compared to prior study on 09/2022.  Summary: Right: Resting right ankle-brachial index is within normal range. The right toe-brachial index is normal. Left: Resting left ankle-brachial index indicates moderate left lower extremity arterial disease. The left toe-brachial index is abnormal. Duplex shows SFA stents are occluded. *See table(s) above for measurements and observations.  Electronically signed by [name] MD on 01/05/2023 at 4:10:09 PM.    Final        Assessment & Plan:   1. Atherosclerosis of native artery of left lower extremity with rest pain (HCC) Recommend:  The patient  has evidence of severe atherosclerotic changes of both lower extremities with rest pain that is associated with preulcerative changes and impending tissue loss of the left foot.  This represents a limb threatening ischemia and places the patient at the risk for left limb loss.  Patient should undergo angiography of the left lower extremity with the hope for intervention for limb salvage.  The risks and benefits as  well as the alternative therapies was discussed in detail with the patient.  All questions were answered.  Patient agrees to proceed with left lower extremity angiography.  The patient will follow up with me in the office after the procedure.      2. Diabetes mellitus due to underlying condition with hyperglycemia, unspecified whether long term insulin use ([place]) Continue hypoglycemic medications as already ordered, these medications have been reviewed and there are no changes at this time.  Hgb A1C to be monitored as already arranged by primary service  3. Benign essential hypertension Continue antihypertensive medications as already ordered, these medications have been reviewed and there are no changes at this time.   Current Outpatient Medications on File Prior to Visit  Medication Sig Dispense Refill   acetaminophen (TYLENOL) 325 MG tablet Take 2 tablets (650 mg total) by mouth every 6 (six) hours as needed for mild pain (or Fever >/= 101).     albuterol (PROVENTIL HFA;VENTOLIN HFA) 108 (90 Base) MCG/ACT inhaler Inhale 2 puffs into the lungs every 4 (four) hours as needed for wheezing or shortness of breath.     amitriptyline (ELAVIL) 100 MG tablet Take 10 mg by mouth at bedtime as needed for sleep.     aspirin EC 81 MG tablet Take 1 tablet (81 mg total) by mouth daily. Swallow whole. 30 tablet 0   clopidogrel (PLAVIX) 75 MG tablet Take 1 tablet (75 mg total) by mouth daily. 30 tablet 6   clopidogrel (PLAVIX) 75 MG tablet Take 1 tablet (75 mg total) by mouth daily. 30 tablet 11    Continuous Blood Gluc Sensor (FREESTYLE LIBRE 2 SENSOR) MISC USE AS DIRECTED EVERY 2 WEEKS     esomeprazole (NEXIUM) 20 MG capsule Take 20 mg by mouth daily at 12 noon.     FLUoxetine (PROZAC) 40 MG capsule Take 1 capsule by mouth daily.     glipiZIDE (GLUCOTROL) 10 MG tablet Take 10 mg by mouth daily as needed.     insulin NPH-regular Human (70-30) 100 UNIT/ML injection Inject 25-70 Units into the skin in the morning and at bedtime. 70 qam 25 qhs     lisinopril-hydrochlorothiazide (ZESTORETIC) 20-25 MG tablet Take 1 tablet by mouth daily.     OZEMPIC, 0.25 OR 0.5 MG/DOSE, 2 MG/1.5ML SOPN Inject 1.5 mLs into the skin daily.     tiZANidine (ZANAFLEX) 4 MG tablet Take 2-4 mg by mouth every 12 (twelve) hours as needed for muscle spasms.     triamcinolone cream (KENALOG) 0.1 % Apply topically.     atorvastatin (LIPITOR) 10 MG tablet Take 1 tablet (10 mg total) by mouth daily. 30 tablet 0   JARDIANCE 25 MG TABS tablet Take 25 mg by mouth daily. (Patient not taking: Reported on 01/18/2023)     No current facility-administered medications on file prior to visit.    There are no Patient Instructions on file for this visit. No follow-ups on file.   Kris Hartmann, NP

## 2023-02-14 NOTE — Interval H&P Note (Signed)
History and Physical Interval Note:  02/14/2023 12:55 PM  Kristie Olson  has presented today for surgery, with the diagnosis of LLE Angio   BARD   ASO w rest pain.  The various methods of treatment have been discussed with the patient and family. After consideration of risks, benefits and other options for treatment, the patient has consented to  Procedure(s): Lower Extremity Angiography (Left) as a surgical intervention.  The patient's history has been reviewed, patient examined, no change in status, stable for surgery.  I have reviewed the patient's chart and labs.  Questions were answered to the patient's satisfaction.     [name]

## 2023-02-14 NOTE — Op Note (Signed)
[facility]  Percutaneous Study/Intervention Procedural Note   Date of Surgery: 02/14/2023  Surgeon(s):Frayda Egley    Assistants:none  Pre-operative Diagnosis: PAD with rest pain LLE  Post-operative diagnosis:  Same  Procedure(s) Performed:             1.  Ultrasound guidance for vascular access right femoral artery             2.  Catheter placement into left common femoral artery from right femoral approach             3.  Aortogram and selective left lower extremity angiogram             4.  Mechanical thrombectomy of the left SFA and popliteal arteries with the Rota Rex device             5.  Percutaneous transluminal angioplasty of the left SFA and popliteal arteries with 5 mm diameter by 30 and a 5 mm diameter by 10 cm length Lutonix drug-coated angioplasty Balloon  6.  Stent placement to the left proximal SFA with a 6 mm diameter by 15 cm length Viabahn stent             7.  StarClose closure device right femoral artery  EBL: 50 cc  Contrast: 50 cc  Fluoro Time: 7.5 minutes  Moderate Conscious Sedation Time: approximately 48 minutes using 4 mg of Versed and 100 mcg of Fentanyl              Indications:  Patient is a 67 y.o.female with recurrent ischemic rest pain in the left lower extremity. The patient has noninvasive study showing occlusion of her previous interventions. The patient is brought in for angiography for further evaluation and potential treatment.  Due to the limb threatening nature of the situation, angiogram was performed for attempted limb salvage. The patient is aware that if the procedure fails, amputation would be expected.  The patient also understands that even with successful revascularization, amputation may still be required due to the severity of the situation.  Risks and benefits are discussed and informed consent is obtained.   Procedure:  The patient was identified and appropriate procedural time out was performed.  The  patient was then placed supine on the table and prepped and draped in the usual sterile fashion. Moderate conscious sedation was administered during a face to face encounter with the patient throughout the procedure with my supervision of the RN administering medicines and monitoring the patient's vital signs, pulse oximetry, telemetry and mental status throughout from the start of the procedure until the patient was taken to the recovery room. Ultrasound was used to evaluate the right common femoral artery.  It was patent .  A digital ultrasound image was acquired.  A Seldinger needle was used to access the right common femoral artery under direct ultrasound guidance and a permanent image was performed.  A 0.035 J wire was advanced without resistance and a 5Fr sheath was placed.  Pigtail catheter was placed into the aorta and an AP aortogram was performed. This demonstrated normal renal arteries and normal aorta and iliac segments without significant stenosis. I then crossed the aortic bifurcation and advanced to the left femoral head. Selective left lower extremity angiogram was then performed. This demonstrated calcific disease in the left common femoral artery with progression from her study last year now in the 69 to 70% range.  There was a flush occlusion just above the left SFA stents which  had been placed previously and then there was reconstitution of the popliteal artery through collaterals distally.  There was then three-vessel runoff distally although the peroneal artery was quite small.  It was felt that it was in the patient's best interest to proceed with intervention after these images to avoid a second procedure and a larger amount of contrast and fluoroscopy based off of the findings from the initial angiogram. The patient was systemically heparinized and a 6 [place] destination sheath was then placed over the Terumo Advantage wire. I then used a Kumpe catheter and the advantage wire to get across  the common femoral lesion and into the SFA occlusion without difficulty.  I then advanced a Kumpe catheter and the advantage wire down into the popliteal artery and confirmed intraluminal flow below the occlusion.  I then exchanged for the V18 wire.  2 perform thrombectomy to remove the thrombus from the left SFA and popliteal arteries, 2 passes were made with the Rota Rex device in the left SFA and popliteal arteries.  This resulted in significant improvement with markedly reduced thrombus burden but there was still significant residual disease throughout the SFA and popliteal arteries as well as high-grade stenosis just proximal to the previously placed stents.  I first used a 5 mm diameter by 30 cm length Lutonix drug-coated angioplasty balloon from the above-knee popliteal artery up to the proximal SFA and then used a 5 mm diameter by 10 cm length Lutonix drug-coated angioplasty balloon in the proximal SFA and common femoral artery.  These inflations were 8 to 10 atm for 1 minute.  Completion imaging showed the mid and distal SFA and proximal popliteal artery to have no greater than 30% stenosis.  There is still stenosis proximal to the previously placed stent as well as thrombus and stenosis in the proximal portion of the previously placed stents and so I elected to cover this area with a covered stent.  A 6 mm diameter by 15 cm length Viabahn stent was deployed from the origin of the SFA down to the proximal to mid superficial femoral artery.  This was postdilated with a 6 mm diameter Lutonix drug-coated balloon proximally with excellent angiographic completion result and less than 10% residual stenosis in the area stented.  There remains some disease in the common femoral artery that would likely be best treated with a femoral endarterectomy in the future. I elected to terminate the procedure. The sheath was removed and StarClose closure device was deployed in the right femoral artery with excellent  hemostatic result. The patient was taken to the recovery room in stable condition having tolerated the procedure well.  Findings:               Aortogram:  This demonstrated normal renal arteries and normal aorta and iliac segments without significant stenosis.             Left Lower Extremity:  This demonstrated calcific disease in the left common femoral artery with progression from her study last year now in the 69 to 70% range.  There was a flush occlusion just above the left SFA stents which had been placed previously and then there was reconstitution of the popliteal artery through collaterals distally.  There was then three-vessel runoff distally although the peroneal artery was quite small.   Disposition: Patient was taken to the recovery room in stable condition having tolerated the procedure well.  Complications: None  [name] 02/14/2023 3:34 PM   This note was created  with Dragon Medical transcription system. Any errors in dictation are purely unintentional. 

## 2023-02-14 NOTE — H&P (View-Only) (Signed)
Subjective:    Patient ID: Kristie Olson, female    DOB: 1956-06-22, 67 y.o.   MRN: [id number] Chief Complaint  Patient presents with   Follow-up    [facility] 3 week with ABI    The patient returns to the office for followup and review status post angiogram with intervention on 01/18/2023.   Procedure: Procedure(s) Performed:             1.  Introduction catheter into left lower extremity 3rd order catheter placement              2.    Contrast injection left lower extremity for distal runoff             3.  Percutaneous transluminal angioplasty and stent placement left superficial femoral artery and popliteal             4.  Mechanical thrombectomy of the SFA and popliteal using the [place] Rex device.               5.  Star close closure right common femoral arteriotomy   The patient notes no improvement in the lower extremity symptoms.  She notes that maybe for a day or so it seems to be better and suddenly her walking is much worse than it has been previously.  She does have some known history of lower back disease and is scheduled for upcoming shots.  She notes that her leg is numb and painful.  Notes that she has been taking her aspirin and Plavix.  She continues to smoke.   There have been no significant changes to the patient's overall health care.  No documented history of amaurosis fugax or recent TIA symptoms. There are no recent neurological changes noted. No documented history of DVT, PE or superficial thrombophlebitis. The patient denies recent episodes of angina or shortness of breath.   ABI's Rt=1.03 and Lt=0.61  (previous ABI's Rt=1.03 and Lt=0.59) Left lower extremity arterial duplex shows occlusion of the previously placed stent in the left SFA.    Review of Systems  Musculoskeletal:  Positive for back pain and gait problem.  Neurological:  Positive for weakness and numbness.  All other systems reviewed and are negative.      Objective:   Physical  Exam Vitals reviewed.  HENT:     Head: Normocephalic.  Cardiovascular:     Rate and Rhythm: Normal rate.     Pulses:          Dorsalis pedis pulses are detected w/ Doppler on the right side and detected w/ Doppler on the left side.       Posterior tibial pulses are detected w/ Doppler on the right side and detected w/ Doppler on the left side.  Pulmonary:     Effort: Pulmonary effort is normal.  Skin:    General: Skin is warm and dry.  Neurological:     Mental Status: She is alert and oriented to person, place, and time.     Motor: Weakness present.     Gait: Gait abnormal.  Psychiatric:        Mood and Affect: Mood normal.        Behavior: Behavior normal.        Thought Content: Thought content normal.        Judgment: Judgment normal.     BP (!) 156/78 (BP Location: Right Arm)   Pulse 87   Resp 16   Past Medical History:  Diagnosis Date  Asthma    Coronary artery disease    Diabetes mellitus without complication 0000000   Fibromyalgia    Hypertension    PAD (peripheral artery disease) 09/03/2022    Social History   Socioeconomic History   Marital status: Married    Spouse name: Not on file   Number of children: Not on file   Years of education: Not on file   Highest education level: Not on file  Occupational History   Not on file  Tobacco Use   Smoking status: Every Day    Packs/day: 1.00    Years: 46.00    Additional pack years: 0.00    Total pack years: 46.00    Types: Cigarettes   Smokeless tobacco: Never  Vaping Use   Vaping Use: Never used  Substance and Sexual Activity   Alcohol use: No   Drug use: Not on file   Sexual activity: Not on file  Other Topics Concern   Not on file  Social History Narrative   Not on file   Social Determinants of Health   Financial Resource Strain: Not on file  Food Insecurity: No Food Insecurity (09/03/2022)   Hunger Vital Sign    Worried About Running Out of Food in the Last Year: Never true    Ran Out of  Food in the Last Year: Never true  Transportation Needs: No Transportation Needs (09/03/2022)   PRAPARE - Hydrologist (Medical): No    Lack of Transportation (Non-Medical): No  Physical Activity: Not on file  Stress: Not on file  Social Connections: Not on file  Intimate Partner Violence: Not At Risk (09/03/2022)   Humiliation, Afraid, Rape, and Kick questionnaire    Fear of Current or Ex-Partner: No    Emotionally Abused: No    Physically Abused: No    Sexually Abused: No    Past Surgical History:  Procedure Laterality Date   BREAST EXCISIONAL BIOPSY Left 1980's   neg   BREAST MASS EXCISION Left    age 13"s, benign   CESAREAN SECTION     CHOLECYSTECTOMY  2009   LOWER EXTREMITY ANGIOGRAPHY Left 09/03/2022   Procedure: Lower Extremity Angiography;  Surgeon: Algernon Huxley, MD;  Location: [facility];  Service: Cardiovascular;  Laterality: Left;   LOWER EXTREMITY ANGIOGRAPHY Right 09/08/2022   Procedure: Lower Extremity Angiography;  Surgeon: Algernon Huxley, MD;  Location: [facility];  Service: Cardiovascular;  Laterality: Right;   LOWER EXTREMITY ANGIOGRAPHY Left 01/18/2023   Procedure: Lower Extremity Angiography;  Surgeon: Katha Cabal, MD;  Location: [facility];  Service: Cardiovascular;  Laterality: Left;    Family History  Problem Relation Age of Onset   Breast cancer Sister 70   Breast cancer Paternal Aunt     Allergies  Allergen Reactions   Ibuprofen Other (See Comments)    STOMACHACHE   Nsaids     Other reaction(s): Other (See Comments) PUD   Percocet [Oxycodone-Acetaminophen] Itching    Patient states "Percocet makes me itch all over"       Latest Ref Rng & Units 09/09/2022    5:31 AM 09/08/2022    4:47 AM 09/07/2022    4:24 AM  CBC  WBC 4.0 - 10.5 K/uL 9.2  7.4  9.1   Hemoglobin 12.0 - 15.0 g/dL 12.3  13.0  12.6   Hematocrit 36.0 - 46.0 % 36.2  37.9  37.0   Platelets 150 - 400 K/uL  438  420   385       CMP     Component Value Date/Time   NA 134 (L) 09/09/2022 0531   NA 132 (L) 07/14/2013 2143   K 4.2 09/09/2022 0531   K 4.3 07/14/2013 2143   CL 98 09/09/2022 0531   CL 95 (L) 07/14/2013 2143   CO2 28 09/09/2022 0531   CO2 31 07/14/2013 2143   GLUCOSE 224 (H) 09/09/2022 0531   GLUCOSE 433 (H) 07/14/2013 2143   BUN 8 01/18/2023 1052   BUN 12 07/14/2013 2143   CREATININE 0.77 01/18/2023 1052   CREATININE 0.91 07/14/2013 2143   CALCIUM 8.9 09/09/2022 0531   CALCIUM 9.3 07/14/2013 2143   PROT 8.2 (H) 04/28/2020 0423   PROT 7.9 07/14/2013 2143   ALBUMIN 4.3 04/28/2020 0423   ALBUMIN 3.7 07/14/2013 2143   AST 17 04/28/2020 0423   AST 16 07/14/2013 2143   ALT 14 04/28/2020 0423   ALT 20 07/14/2013 2143   ALKPHOS 89 04/28/2020 0423   ALKPHOS 118 07/14/2013 2143   BILITOT 0.6 04/28/2020 0423   BILITOT 0.3 07/14/2013 2143   GFRNONAA >60 01/18/2023 1052   GFRNONAA >60 07/14/2013 2143   GFRAA >60 07/01/2020 1636   GFRAA >60 07/14/2013 2143     VAS [place] ABI WITH/WO TBI  Result Date: 02/09/2023  LOWER EXTREMITY DOPPLER STUDY Patient Name:  Kristie Olson  Date of Exam:   02/09/2023 Medical Rec #: [id number]        Accession #:    [id number] Date of Birth: [birth year]       Patient Gender: F Patient Age:   67 years Exam Location:  [facility] Procedure:      VAS [place] ABI WITH/WO TBI Referring Phys: [name] --------------------------------------------------------------------------------  Indications: Peripheral artery disease.  Vascular Interventions: 01/18/2023 Percutaneous transluminal angioplasty and stent                         placement left superficial femoral artery and popliteal                         4. Mechanical thrombectomy of the SFA and popliteal                         using the [place] Rex device.                         Percutaneous transluminal angioplasty of left SFA and                         proximal popliteal artery with a pair 4 mm diameter                          Lutonix drug-coated angioplasty balloons                         5. Stent placement to the left SFA with 6 mm diameter by                         25 cm length Viabahn stent                         Percutaneous transluminal angioplasty of  right SFA and                         proximal popliteal artery with 4 mm diameter by 30 cm                         length Lutonix drug-coated angioplasty balloon                         5. Viabahn stent placement x2 to the right SFA and                         proximal popliteal artery with 6 mm diameter by 25 cm                         length and 6 mm diameter by 10 cm length Viabahn stents. Performing Technologist: Concha Norway RVT  Examination Guidelines: A complete evaluation includes at minimum, Doppler waveform signals and systolic blood pressure reading at the level of bilateral brachial, anterior tibial, and posterior tibial arteries, when vessel segments are accessible. Bilateral testing is considered an integral part of a complete examination. Photoelectric Plethysmograph (PPG) waveforms and toe systolic pressure readings are included as required and additional duplex testing as needed. Limited examinations for reoccurring indications may be performed as noted.  ABI Findings: +---------+------------------+-----+---------+--------+ Right    Rt Pressure (mmHg)IndexWaveform Comment  +---------+------------------+-----+---------+--------+ Brachial 145                                      +---------+------------------+-----+---------+--------+ ATA      153               1.02 triphasic         +---------+------------------+-----+---------+--------+ PTA      154               1.03 triphasic         +---------+------------------+-----+---------+--------+ Great Toe126               0.84 Normal            +---------+------------------+-----+---------+--------+ +---------+------------------+-----+----------+-------+ Left     Lt  Pressure (mmHg)IndexWaveform  Comment +---------+------------------+-----+----------+-------+ Brachial 150                                      +---------+------------------+-----+----------+-------+ ATA      74                0.49 monophasic        +---------+------------------+-----+----------+-------+ PTA      88                0.59 monophasic        +---------+------------------+-----+----------+-------+ DP       88                0.59                   +---------+------------------+-----+----------+-------+ Great Toe46                0.31 Abnormal          +---------+------------------+-----+----------+-------+ +-------+-----------+-----------+------------+------------+ ABI/TBIToday's ABIToday's TBIPrevious ABIPrevious TBI +-------+-----------+-----------+------------+------------+ Right  1.03       .  95        1.03        .84          +-------+-----------+-----------+------------+------------+ Left   .61        .28        .59         .31          +-------+-----------+-----------+------------+------------+ Bilateral ABIs and TBIs appear essentially unchanged compared to prior study on 01/04/2023.  Summary: Right: Resting right ankle-brachial index is within normal range. The right toe-brachial index is normal. Left: Resting left ankle-brachial index indicates moderate left lower extremity arterial disease. The left toe-brachial index is abnormal. Duplex comfirms left SFA stents occluded again s/p thrombectomy of 01/18/2023. *See table(s) above for measurements and observations.  Electronically signed by [name] MD on 02/09/2023 at 4:29:33 PM.    Final    VAS [place] ABI WITH/WO TBI  Result Date: 01/05/2023  LOWER EXTREMITY DOPPLER STUDY Patient Name:  Kristie Olson  Date of Exam:   01/04/2023 Medical Rec #: [id number]        Accession #:    [id number] Date of Birth: Jul 01, 1956       Patient Gender: F Patient Age:   66 years Exam Location:  [facility]  Procedure:      VAS [place] ABI WITH/WO TBI Referring Phys: --------------------------------------------------------------------------------  Indications: Peripheral artery disease.  Vascular Interventions: Percutaneous transluminal angioplasty of left SFA and                         proximal popliteal artery with a pair 4 mm diameter                         Lutonix drug-coated angioplasty balloons                         5. Stent placement to the left SFA with 6 mm diameter by                         25 cm length Viabahn stent                         Percutaneous transluminal angioplasty of right SFA and                         proximal popliteal artery with 4 mm diameter by 30 cm                         length Lutonix drug-coated angioplasty balloon                         5. Viabahn stent placement x2 to the right SFA and                         proximal popliteal artery with 6 mm diameter by 25 cm                         length and 6 mm diameter by 10 cm length Viabahn stents. Performing Technologist: Concha Norway RVT  Examination Guidelines: A complete evaluation includes at minimum, Doppler waveform signals and systolic blood pressure reading at the level of  bilateral brachial, anterior tibial, and posterior tibial arteries, when vessel segments are accessible. Bilateral testing is considered an integral part of a complete examination. Photoelectric Plethysmograph (PPG) waveforms and toe systolic pressure readings are included as required and additional duplex testing as needed. Limited examinations for reoccurring indications may be performed as noted.  ABI Findings: +---------+------------------+-----+--------+--------+ Right    Rt Pressure (mmHg)IndexWaveformComment  +---------+------------------+-----+--------+--------+ Brachial 145                                     +---------+------------------+-----+--------+--------+ ATA      153               1.03 biphasic          +---------+------------------+-----+--------+--------+ PTA      154               1.03 biphasic         +---------+------------------+-----+--------+--------+ [place]               0.92 Normal           +---------+------------------+-----+--------+--------+ +---------+------------------+-----+----------+-------+ Left     Lt Pressure (mmHg)IndexWaveform  Comment +---------+------------------+-----+----------+-------+ Brachial 149                                      +---------+------------------+-----+----------+-------+ ATA      91                0.61 monophasic        +---------+------------------+-----+----------+-------+ PTA      87                0.58 monophasic        +---------+------------------+-----+----------+-------+ Great Toe42                0.28 Abnormal          +---------+------------------+-----+----------+-------+ +-------+-----------+-----------+------------+------------+ ABI/TBIToday's ABIToday's TBIPrevious ABIPrevious TBI +-------+-----------+-----------+------------+------------+ Right  1.03       .92        1.11        .66          +-------+-----------+-----------+------------+------------+ Left   .61        .28        1.07        .79          +-------+-----------+-----------+------------+------------+ Left ABIs and TBIs appear decreased compared to prior study on 09/2022.  Summary: Right: Resting right ankle-brachial index is within normal range. The right toe-brachial index is normal. Left: Resting left ankle-brachial index indicates moderate left lower extremity arterial disease. The left toe-brachial index is abnormal. Duplex shows SFA stents are occluded. *See table(s) above for measurements and observations.  Electronically signed by [name] MD on 01/05/2023 at 4:10:09 PM.    Final        Assessment & Plan:   1. Atherosclerosis of native artery of left lower extremity with rest pain (HCC) Recommend:  The patient  has evidence of severe atherosclerotic changes of both lower extremities with rest pain that is associated with preulcerative changes and impending tissue loss of the left foot.  This represents a limb threatening ischemia and places the patient at the risk for left limb loss.  Patient should undergo angiography of the left lower extremity with the hope for intervention for limb salvage.  The risks and benefits as  well as the alternative therapies was discussed in detail with the patient.  All questions were answered.  Patient agrees to proceed with left lower extremity angiography.  The patient will follow up with me in the office after the procedure.      2. Diabetes mellitus due to underlying condition with hyperglycemia, unspecified whether long term insulin use ([place]) Continue hypoglycemic medications as already ordered, these medications have been reviewed and there are no changes at this time.  Hgb A1C to be monitored as already arranged by primary service  3. Benign essential hypertension Continue antihypertensive medications as already ordered, these medications have been reviewed and there are no changes at this time.   Current Outpatient Medications on File Prior to Visit  Medication Sig Dispense Refill   acetaminophen (TYLENOL) 325 MG tablet Take 2 tablets (650 mg total) by mouth every 6 (six) hours as needed for mild pain (or Fever >/= 101).     albuterol (PROVENTIL HFA;VENTOLIN HFA) 108 (90 Base) MCG/ACT inhaler Inhale 2 puffs into the lungs every 4 (four) hours as needed for wheezing or shortness of breath.     amitriptyline (ELAVIL) 100 MG tablet Take 10 mg by mouth at bedtime as needed for sleep.     aspirin EC 81 MG tablet Take 1 tablet (81 mg total) by mouth daily. Swallow whole. 30 tablet 0   clopidogrel (PLAVIX) 75 MG tablet Take 1 tablet (75 mg total) by mouth daily. 30 tablet 6   clopidogrel (PLAVIX) 75 MG tablet Take 1 tablet (75 mg total) by mouth daily. 30 tablet 11    Continuous Blood Gluc Sensor (FREESTYLE LIBRE 2 SENSOR) MISC USE AS DIRECTED EVERY 2 WEEKS     esomeprazole (NEXIUM) 20 MG capsule Take 20 mg by mouth daily at 12 noon.     FLUoxetine (PROZAC) 40 MG capsule Take 1 capsule by mouth daily.     glipiZIDE (GLUCOTROL) 10 MG tablet Take 10 mg by mouth daily as needed.     insulin NPH-regular Human (70-30) 100 UNIT/ML injection Inject 25-70 Units into the skin in the morning and at bedtime. 70 qam 25 qhs     lisinopril-hydrochlorothiazide (ZESTORETIC) 20-25 MG tablet Take 1 tablet by mouth daily.     OZEMPIC, 0.25 OR 0.5 MG/DOSE, 2 MG/1.5ML SOPN Inject 1.5 mLs into the skin daily.     tiZANidine (ZANAFLEX) 4 MG tablet Take 2-4 mg by mouth every 12 (twelve) hours as needed for muscle spasms.     triamcinolone cream (KENALOG) 0.1 % Apply topically.     atorvastatin (LIPITOR) 10 MG tablet Take 1 tablet (10 mg total) by mouth daily. 30 tablet 0   JARDIANCE 25 MG TABS tablet Take 25 mg by mouth daily. (Patient not taking: Reported on 01/18/2023)     No current facility-administered medications on file prior to visit.    There are no Patient Instructions on file for this visit. No follow-ups on file.   Kris Hartmann, NP

## 2023-02-15 ENCOUNTER — Encounter: Payer: Self-pay | Admitting: Vascular Surgery

## 2023-02-17 LAB — URINE CULTURE: Culture: 80000 — AB

## 2023-03-15 ENCOUNTER — Other Ambulatory Visit (INDEPENDENT_AMBULATORY_CARE_PROVIDER_SITE_OTHER): Payer: Self-pay | Admitting: Vascular Surgery

## 2023-03-15 DIAGNOSIS — Z9889 Other specified postprocedural states: Secondary | ICD-10-CM

## 2023-03-17 NOTE — Progress Notes (Signed)
Referring Physician:  No referring provider defined for this encounter.  Primary Physician:  [facility]  History of Present Illness: 03/21/2023 Ms. Kristie Olson has a history of HTN, TIA, PAD, asthma, DM, and peripheral neuropathy.   She had angiogram with intervention to her lower extremities 09/03/22 and 09/08/22.  Of note, she also and a repeat angiogram with intervention to left lower extremity on 01/18/23.   She has known lumbar spondylosis and facet hypertrophy- this is likely cause of her LBP. She has mild central stenosis L3-L4 and some foraminal stenosis L3-L5. This may be contributing to her leg pain, but she may also have vascular component.  Did phone visit with me on 02/01/23 and she wanted to scheduled repeat ESI with Dr. Mariah Milling. She had Bilateral L5-S1 transforaminal epidural injections on 03/04/23.  She also had repeat angiogram with stenting to both legs by Dr. Wyn Quaker on 02/14/23.   She has intermittent LBP that is worse with standing and walking. No pain with sitting. Some buttock pain, but no groin or leg pain. She is doing much better than she was at her last visit. She thinks she had some relief with last ESI. Previously her leg pain was her primary complaint, now it is her back pain.   She continues on plavix.   She smokes at least 1 ppd- she is working on quitting.    Conservative measures:  Physical therapy: no  Multimodal medical therapy including regular antiinflammatories: tylenol  Injections:  Bilateral L5-S1 transforaminal epidural injections on 03/04/23 bilateral L5-S1 TFESI 12/30/2022 with 60% improvement   Past Surgery: no spinal surgery  Kristie Olson has no symptoms of cervical myelopathy.  The symptoms are causing a significant impact on the patient's life.   Review of Systems:  A 10 point review of systems is negative, except for the pertinent positives and negatives detailed in the HPI.  Past Medical History: Past Medical  History:  Diagnosis Date   Asthma    Coronary artery disease    Diabetes mellitus without complication (HCC) 1990   Fibromyalgia    Hypertension    PAD (peripheral artery disease) (HCC) 09/03/2022    Past Surgical History: Past Surgical History:  Procedure Laterality Date   BREAST EXCISIONAL BIOPSY Left 1980's   neg   BREAST MASS EXCISION Left    age 36"s, benign   CESAREAN SECTION     CHOLECYSTECTOMY  2009   LOWER EXTREMITY ANGIOGRAPHY Left 09/03/2022   Procedure: Lower Extremity Angiography;  Surgeon: Annice Needy, MD;  Location: [facility] IN[facility]IVE [facility];  Service: Cardiovascular;  Laterality: Left;   LOWER EXTREMITY ANGIOGRAPHY Right 09/08/2022   Procedure: Lower Extremity Angiography;  Surgeon: Annice Needy, MD;  Location: [facility];  Service: Cardiovascular;  Laterality: Right;   LOWER EXTREMITY ANGIOGRAPHY Left 01/18/2023   Procedure: Lower Extremity Angiography;  Surgeon: Renford Dills, MD;  Location: [facility];  Service: Cardiovascular;  Laterality: Left;   LOWER EXTREMITY ANGIOGRAPHY Left 02/14/2023   Procedure: Lower Extremity Angiography;  Surgeon: Annice Needy, MD;  Location: [facility];  Service: Cardiovascular;  Laterality: Left;    Allergies: Allergies as of 03/21/2023 - Review Complete 03/21/2023  Allergen Reaction Noted   Ibuprofen Other (See Comments) 11/22/2006   Nsaids  12/05/2008   Percocet [oxycodone-acetaminophen] Itching 09/08/2022    Medications: Outpatient Encounter Medications as of 03/21/2023  Medication Sig   acetaminophen (TYLENOL) 325 MG tablet Take 2 tablets (650 mg total)  by mouth every 6 (six) hours as needed for mild pain (or Fever >/= 101).   albuterol (PROVENTIL HFA;VENTOLIN HFA) 108 (90 Base) MCG/ACT inhaler Inhale 2 puffs into the lungs every 4 (four) hours as needed for wheezing or shortness of breath.   amitriptyline (ELAVIL) 100 MG tablet Take 10 mg by mouth at bedtime as needed for sleep.   aspirin EC 81  MG tablet Take 1 tablet (81 mg total) by mouth daily. Swallow whole.   clopidogrel (PLAVIX) 75 MG tablet Take 1 tablet (75 mg total) by mouth daily.   clopidogrel (PLAVIX) 75 MG tablet Take 1 tablet (75 mg total) by mouth daily.   Continuous Blood Gluc Sensor (FREESTYLE LIBRE 2 SENSOR) MISC USE AS DIRECTED EVERY 2 WEEKS   esomeprazole (NEXIUM) 20 MG capsule Take 20 mg by mouth daily at 12 noon.   FLUoxetine (PROZAC) 40 MG capsule Take 1 capsule by mouth daily.   glipiZIDE (GLUCOTROL) 10 MG tablet Take 10 mg by mouth daily as needed.   insulin NPH-regular Human (70-30) 100 UNIT/ML injection Inject 25-70 Units into the skin in the morning and at bedtime. 70 qam 25 qhs   JARDIANCE 25 MG TABS tablet Take 25 mg by mouth daily.   lisinopril-hydrochlorothiazide (ZESTORETIC) 20-25 MG tablet Take 1 tablet by mouth daily.   OZEMPIC, 0.25 OR 0.5 MG/DOSE, 2 MG/1.5ML SOPN Inject 1.5 mLs into the skin daily.   tiZANidine (ZANAFLEX) 4 MG tablet Take 2-4 mg by mouth every 12 (twelve) hours as needed for muscle spasms.   triamcinolone cream (KENALOG) 0.1 % Apply topically.   atorvastatin (LIPITOR) 10 MG tablet Take 1 tablet (10 mg total) by mouth daily.   No facility-administered encounter medications on file as of 03/21/2023.    Social History: Social History   Tobacco Use   Smoking status: Every Day    Packs/day: 1.00    Years: 46.00    Additional pack years: 0.00    Total pack years: 46.00    Types: Cigarettes   Smokeless tobacco: Never  Vaping Use   Vaping Use: Never used  Substance Use Topics   Alcohol use: No   Drug use: Not Currently    Family Medical History: Family History  Problem Relation Age of Onset   Breast cancer Sister 72   Breast cancer Paternal Aunt     Physical Examination: Vitals:   03/21/23 1442  BP: 125/60      Awake, alert, oriented to person, place, and time.  Speech is clear and fluent. Fund of knowledge is appropriate.   Cranial Nerves: Pupils equal round  and reactive to light.  Facial tone is symmetric.    No abnormal lesions on exposed skin.   Strength:  Side Iliopsoas Quads Hamstring PF DF EHL  R 5 5 5 5 5 5   L 5 5 5 5 5 5    Clonus is not present.    Bilateral lower extremity sensation is intact to light touch.     She ambulates with a cane.    Medical Decision Making  Imaging: None   Assessment and Plan: Kristie Olson is a pleasant 67 y.o. female with improvement since her last visit. She no longer as leg or groin pain- a lot of this was likely more vascular in origin.   She continues with intermittent LBP that is worse with standing and walking. No pain with sitting. Some buttock pain, but no groin or leg pain.  She has known lumbar spondylosis and facet hypertrophy-  this is likely cause of her LBP. She has mild central stenosis L3-L4 and some foraminal stenosis L3-L5. As above, I think a lot of her leg pain was vascular in nature.   Treatment options discussed with patient and following plan made:   - Follow up with Dr. Mariah Milling to discuss injections to target back pain (?facet injections).  - Discussed PT for her lumbar spine. She declines.  - Continue to work on smoking cessation.  - She is not interested in any surgery for her back. She will follow up with PMR for now and see me prn.   I spent a total of 15 minutes in face-to-face and non-face-to-face activities related to this patient's care today including review of outside records, review of imaging, review of symptoms, physical exam, discussion of differential diagnosis, discussion of treatment options, and documentation.   Drake Leach PA-C Dept. of Neurosurgery

## 2023-03-18 ENCOUNTER — Ambulatory Visit (INDEPENDENT_AMBULATORY_CARE_PROVIDER_SITE_OTHER): Payer: 59

## 2023-03-18 ENCOUNTER — Encounter (INDEPENDENT_AMBULATORY_CARE_PROVIDER_SITE_OTHER): Payer: Self-pay | Admitting: Nurse Practitioner

## 2023-03-18 ENCOUNTER — Ambulatory Visit (INDEPENDENT_AMBULATORY_CARE_PROVIDER_SITE_OTHER): Payer: 59 | Admitting: Nurse Practitioner

## 2023-03-18 VITALS — BP 114/75 | HR 78 | Resp 16

## 2023-03-18 DIAGNOSIS — I70222 Atherosclerosis of native arteries of extremities with rest pain, left leg: Secondary | ICD-10-CM | POA: Diagnosis not present

## 2023-03-18 DIAGNOSIS — I739 Peripheral vascular disease, unspecified: Secondary | ICD-10-CM

## 2023-03-18 DIAGNOSIS — Z9889 Other specified postprocedural states: Secondary | ICD-10-CM

## 2023-03-18 DIAGNOSIS — E0865 Diabetes mellitus due to underlying condition with hyperglycemia: Secondary | ICD-10-CM

## 2023-03-18 DIAGNOSIS — F17200 Nicotine dependence, unspecified, uncomplicated: Secondary | ICD-10-CM | POA: Diagnosis not present

## 2023-03-18 DIAGNOSIS — E785 Hyperlipidemia, unspecified: Secondary | ICD-10-CM

## 2023-03-21 ENCOUNTER — Ambulatory Visit (INDEPENDENT_AMBULATORY_CARE_PROVIDER_SITE_OTHER): Payer: 59 | Admitting: Orthopedic Surgery

## 2023-03-21 ENCOUNTER — Encounter: Payer: Self-pay | Admitting: Orthopedic Surgery

## 2023-03-21 VITALS — BP 125/60 | Ht 60.0 in | Wt 204.0 lb

## 2023-03-21 DIAGNOSIS — M48061 Spinal stenosis, lumbar region without neurogenic claudication: Secondary | ICD-10-CM | POA: Diagnosis not present

## 2023-03-21 DIAGNOSIS — M47816 Spondylosis without myelopathy or radiculopathy, lumbar region: Secondary | ICD-10-CM | POA: Diagnosis not present

## 2023-03-21 LAB — VAS US ABI WITH/WO TBI
Left ABI: 1.08
Right ABI: 1.12

## 2023-04-04 ENCOUNTER — Encounter (INDEPENDENT_AMBULATORY_CARE_PROVIDER_SITE_OTHER): Payer: Self-pay | Admitting: Nurse Practitioner

## 2023-04-04 NOTE — Progress Notes (Unsigned)
Subjective:    Patient ID: Kristie Olson, female    DOB: 08/30/1956, 67 y.o.   MRN: [id number] Chief Complaint  Patient presents with   Follow-up    Ultrasound follow up    The patient returns to the office for followup and review status post angiogram with intervention on 02/14/2023.   Procedure: Procedure(s) Performed:             1.  Ultrasound guidance for vascular access right femoral artery             2.  Catheter placement into left common femoral artery from right femoral approach             3.  Aortogram and selective left lower extremity angiogram             4.  Mechanical thrombectomy of the left SFA and popliteal arteries with the Rota Rex device             5.  Percutaneous transluminal angioplasty of the left SFA and popliteal arteries with 5 mm diameter by 30 and a 5 mm diameter by 10 cm length Lutonix drug-coated angioplasty Balloon             6.  Stent placement to the left proximal SFA with a 6 mm diameter by 15 cm length Viabahn stent             7.  StarClose closure device right femoral artery   The patient notes improvement in the lower extremity symptoms. No interval shortening of the patient's claudication distance or rest pain symptoms. No new ulcers or wounds have occurred since the last visit.  There have been no significant changes to the patient's overall health care.  No documented history of amaurosis fugax or recent TIA symptoms. There are no recent neurological changes noted. No documented history of DVT, PE or superficial thrombophlebitis. The patient denies recent episodes of angina or shortness of breath.   ABI's Rt=1.12 and Lt=1.08  (previous ABI's Rt=1.03 and Lt=0.67) Duplex US of the bilateral lower extremity shows good triphasic tibial artery waveforms bilaterally with normal toe waveforms bilaterally.    Review of Systems  Musculoskeletal:  Positive for arthralgias and back pain.  All other systems reviewed and are  negative.      Objective:   Physical Exam Vitals reviewed.  HENT:     Head: Normocephalic.  Cardiovascular:     Rate and Rhythm: Normal rate.     Pulses:          Dorsalis pedis pulses are detected w/ Doppler on the right side and detected w/ Doppler on the left side.       Posterior tibial pulses are detected w/ Doppler on the right side and detected w/ Doppler on the left side.  Pulmonary:     Effort: Pulmonary effort is normal.  Skin:    General: Skin is warm and dry.  Neurological:     Mental Status: She is alert and oriented to person, place, and time.  Psychiatric:        Mood and Affect: Mood normal.        Behavior: Behavior normal.        Thought Content: Thought content normal.        Judgment: Judgment normal.     BP 114/75 (BP Location: Left Arm)   Pulse 78   Resp 16   Past Medical History:  Diagnosis Date   Asthma  Coronary artery disease    Diabetes mellitus without complication (HCC) 1990   Fibromyalgia    Hypertension    PAD (peripheral artery disease) (HCC) 09/03/2022    Social History   Socioeconomic History   Marital status: Married    Spouse name: Not on file   Number of children: Not on file   Years of education: Not on file   Highest education level: Not on file  Occupational History   Not on file  Tobacco Use   Smoking status: Every Day    Packs/day: 1.00    Years: 46.00    Additional pack years: 0.00    Total pack years: 46.00    Types: Cigarettes   Smokeless tobacco: Never  Vaping Use   Vaping Use: Never used  Substance and Sexual Activity   Alcohol use: No   Drug use: Not Currently   Sexual activity: Not on file  Other Topics Concern   Not on file  Social History Narrative   Lives with husband, Ruebin and son, Ivin Booty. No pets.   Social Determinants of Health   Financial Resource Strain: Not on file  Food Insecurity: No Food Insecurity (09/03/2022)   Hunger Vital Sign    Worried About Running Out of Food in the  Last Year: Never true    Ran Out of Food in the Last Year: Never true  Transportation Needs: No Transportation Needs (09/03/2022)   PRAPARE - Administrator, Civil Service (Medical): No    Lack of Transportation (Non-Medical): No  Physical Activity: Not on file  Stress: Not on file  Social Connections: Not on file  Intimate Partner Violence: Not At Risk (09/03/2022)   Humiliation, Afraid, Rape, and Kick questionnaire    Fear of Current or Ex-Partner: No    Emotionally Abused: No    Physically Abused: No    Sexually Abused: No    Past Surgical History:  Procedure Laterality Date   BREAST EXCISIONAL BIOPSY Left 1980's   neg   BREAST MASS EXCISION Left    age 61"s, benign   CESAREAN SECTION     CHOLECYSTECTOMY  2009   LOWER EXTREMITY ANGIOGRAPHY Left 09/03/2022   Procedure: Lower Extremity Angiography;  Surgeon: Annice Needy, MD;  Location: [facility];  Service: Cardiovascular;  Laterality: Left;   LOWER EXTREMITY ANGIOGRAPHY Right 09/08/2022   Procedure: Lower Extremity Angiography;  Surgeon: Annice Needy, MD;  Location: [facility];  Service: Cardiovascular;  Laterality: Right;   LOWER EXTREMITY ANGIOGRAPHY Left 01/18/2023   Procedure: Lower Extremity Angiography;  Surgeon: Renford Dills, MD;  Location: [facility];  Service: Cardiovascular;  Laterality: Left;   LOWER EXTREMITY ANGIOGRAPHY Left 02/14/2023   Procedure: Lower Extremity Angiography;  Surgeon: Annice Needy, MD;  Location: [facility];  Service: Cardiovascular;  Laterality: Left;    Family History  Problem Relation Age of Onset   Breast cancer Sister 3   Breast cancer Paternal Aunt     Allergies  Allergen Reactions   Ibuprofen Other (See Comments)    STOMACHACHE   Nsaids     Other reaction(s): Other (See Comments) PUD   Percocet [Oxycodone-Acetaminophen] Itching    Patient states "Percocet makes me itch all over"       Latest Ref Rng & Units 09/09/2022     5:31 AM 09/08/2022    4:47 AM 09/07/2022    4:24 AM  CBC  WBC 4.0 - 10.5 K/uL 9.2  7.4  9.1   Hemoglobin 12.0 - 15.0 g/dL 16.1  09.6  04.5   Hematocrit 36.0 - 46.0 % 36.2  37.9  37.0   Platelets 150 - 400 K/uL 438  420  385       CMP     Component Value Date/Time   NA 134 (L) 09/09/2022 0531   NA 132 (L) 07/14/2013 2143   K 4.2 09/09/2022 0531   K 4.3 07/14/2013 2143   CL 98 09/09/2022 0531   CL 95 (L) 07/14/2013 2143   CO2 28 09/09/2022 0531   CO2 31 07/14/2013 2143   GLUCOSE 224 (H) 09/09/2022 0531   GLUCOSE 433 (H) 07/14/2013 2143   BUN 8 02/14/2023 1302   BUN 12 07/14/2013 2143   CREATININE 0.66 02/14/2023 1302   CREATININE 0.91 07/14/2013 2143   CALCIUM 8.9 09/09/2022 0531   CALCIUM 9.3 07/14/2013 2143   PROT 8.2 (H) 04/28/2020 0423   PROT 7.9 07/14/2013 2143   ALBUMIN 4.3 04/28/2020 0423   ALBUMIN 3.7 07/14/2013 2143   AST 17 04/28/2020 0423   AST 16 07/14/2013 2143   ALT 14 04/28/2020 0423   ALT 20 07/14/2013 2143   ALKPHOS 89 04/28/2020 0423   ALKPHOS 118 07/14/2013 2143   BILITOT 0.6 04/28/2020 0423   BILITOT 0.3 07/14/2013 2143   GFRNONAA >60 02/14/2023 1302   GFRNONAA >60 07/14/2013 2143   GFRAA >60 07/01/2020 1636   GFRAA >60 07/14/2013 2143     VAS [place] ABI WITH/WO TBI  Result Date: 03/21/2023  LOWER EXTREMITY DOPPLER STUDY Patient Name:  Trey Dubois Cocco  Date of Exam:   03/18/2023 Medical Rec #: [id number]        Accession #:    [id number] Date of Birth: [birth year]       Patient Gender: F Patient Age:   15 years Exam Location:  [facility] Procedure:      VAS [place] ABI WITH/WO TBI Referring Phys: --------------------------------------------------------------------------------  Indications: Peripheral artery disease.  Vascular Interventions: 02/14/2023 Mechanical thrombectomy of the left SFA and                         popliteal arteries with the Rota Rex device                         5. Percutaneous transluminal angioplasty of the left SFA                          and popliteal arteries with 5 mm diameter by 30 and a 5                         mm diameter by 10 cm length Lutonix drug-coated                         angioplasty                         Balloon                         6. Stent placement to the left proximal SFA with a 6 mm                         diameter by 15  cm length Viabahn stent                         01/18/2023 Percutaneous transluminal angioplasty and stent                         placement left superficial femoral artery and popliteal                         4. Mechanical thrombectomy of the SFA and popliteal                         using the [place] Rex device.                         Percutaneous transluminal angioplasty of left SFA and                         proximal popliteal artery with a pair 4 mm diameter                         Lutonix drug-coated angioplasty balloons                         5. Stent placement to the left SFA with 6 mm diameter by                         25 cm length Viabahn stent                         Percutaneous transluminal angioplasty of right SFA and                         proximal popliteal artery with 4 mm diameter by 30 cm                         length Lutonix drug-coated angioplasty balloon                         5. Viabahn stent placement x2 to the right SFA and                         proximal popliteal artery with 6 mm diameter by 25 cm                         length and 6 mm diameter by 10 cm length Viabahn stents. Comparison Study: 02/09/2023 Performing Technologist: Salvadore Farber RVT  Examination Guidelines: A complete evaluation includes at minimum, Doppler waveform signals and systolic blood pressure reading at the level of bilateral brachial, anterior tibial, and posterior tibial arteries, when vessel segments are accessible. Bilateral testing is considered an integral part of a complete examination. Photoelectric Plethysmograph (PPG) waveforms and toe systolic pressure readings are included  as required and additional duplex testing as needed. Limited examinations for reoccurring indications may be performed as noted.  ABI Findings: +---------+------------------+-----+---------+--------+ Right    Rt Pressure (mmHg)IndexWaveform Comment  +---------+------------------+-----+---------+--------+ Brachial 148                                      +---------+------------------+-----+---------+--------+  ATA      166               1.12 triphasic         +---------+------------------+-----+---------+--------+ PTA      152               1.03 triphasic         +---------+------------------+-----+---------+--------+ Great Toe122               0.82 Normal            +---------+------------------+-----+---------+--------+ +---------+------------------+-----+---------+-------+ Left     Lt Pressure (mmHg)IndexWaveform Comment +---------+------------------+-----+---------+-------+ Brachial 140                                     +---------+------------------+-----+---------+-------+ ATA      151               1.02 triphasic        +---------+------------------+-----+---------+-------+ PTA      160               1.08 triphasic        +---------+------------------+-----+---------+-------+ Great Toe111               0.75 Normal           +---------+------------------+-----+---------+-------+ +-------+-----------+-----------+------------+------------+ ABI/TBIToday's ABIToday's TBIPrevious ABIPrevious TBI +-------+-----------+-----------+------------+------------+ Right  1.12       .82        1.03        .95          +-------+-----------+-----------+------------+------------+ Left   1.08       .75        .67         .28          +-------+-----------+-----------+------------+------------+ Left ABIs and TBIs appear increased compared to prior study on 02/09/2023.  Summary: Right: Resting right ankle-brachial index is within normal range. The right  toe-brachial index is normal. Left: Resting left ankle-brachial index is within normal range. The left toe-brachial index is normal. *See table(s) above for measurements and observations.  Electronically signed by Festus Barren MD on 03/21/2023 at 9:24:50 AM.    Final    VAS [place] ABI WITH/WO TBI  Result Date: 02/09/2023  LOWER EXTREMITY DOPPLER STUDY Patient Name:  Michealla Earnhardt Muhlbauer  Date of Exam:   02/09/2023 Medical Rec #: [id number]        Accession #:    [id number] Date of Birth: 03-13-1956       Patient Gender: F Patient Age:   45 years Exam Location:  [facility] Procedure:      VAS [place] ABI WITH/WO TBI Referring Phys: Festus Barren --------------------------------------------------------------------------------  Indications: Peripheral artery disease.  Vascular Interventions: 01/18/2023 Percutaneous transluminal angioplasty and stent                         placement left superficial femoral artery and popliteal                         4. Mechanical thrombectomy of the SFA and popliteal                         using the [place] Rex device.  Percutaneous transluminal angioplasty of left SFA and                         proximal popliteal artery with a pair 4 mm diameter                         Lutonix drug-coated angioplasty balloons                         5. Stent placement to the left SFA with 6 mm diameter by                         25 cm length Viabahn stent                         Percutaneous transluminal angioplasty of right SFA and                         proximal popliteal artery with 4 mm diameter by 30 cm                         length Lutonix drug-coated angioplasty balloon                         5. Viabahn stent placement x2 to the right SFA and                         proximal popliteal artery with 6 mm diameter by 25 cm                         length and 6 mm diameter by 10 cm length Viabahn stents. Performing Technologist: Salvadore Farber RVT  Examination Guidelines: A complete  evaluation includes at minimum, Doppler waveform signals and systolic blood pressure reading at the level of bilateral brachial, anterior tibial, and posterior tibial arteries, when vessel segments are accessible. Bilateral testing is considered an integral part of a complete examination. Photoelectric Plethysmograph (PPG) waveforms and toe systolic pressure readings are included as required and additional duplex testing as needed. Limited examinations for reoccurring indications may be performed as noted.  ABI Findings: +---------+------------------+-----+---------+--------+ Right    Rt Pressure (mmHg)IndexWaveform Comment  +---------+------------------+-----+---------+--------+ Brachial 145                                      +---------+------------------+-----+---------+--------+ ATA      153               1.02 triphasic         +---------+------------------+-----+---------+--------+ PTA      154               1.03 triphasic         +---------+------------------+-----+---------+--------+ Great Toe126               0.84 Normal            +---------+------------------+-----+---------+--------+ +---------+------------------+-----+----------+-------+ Left     Lt Pressure (mmHg)IndexWaveform  Comment +---------+------------------+-----+----------+-------+ Brachial 150                                      +---------+------------------+-----+----------+-------+  ATA      74                0.49 monophasic        +---------+------------------+-----+----------+-------+ PTA      88                0.59 monophasic        +---------+------------------+-----+----------+-------+ DP       88                0.59                   +---------+------------------+-----+----------+-------+ Great Toe46                0.31 Abnormal          +---------+------------------+-----+----------+-------+ +-------+-----------+-----------+------------+------------+ ABI/TBIToday's  ABIToday's TBIPrevious ABIPrevious TBI +-------+-----------+-----------+------------+------------+ Right  1.03       .95        1.03        .84          +-------+-----------+-----------+------------+------------+ Left   .61        .28        .59         .31          +-------+-----------+-----------+------------+------------+ Bilateral ABIs and TBIs appear essentially unchanged compared to prior study on 01/04/2023.  Summary: Right: Resting right ankle-brachial index is within normal range. The right toe-brachial index is normal. Left: Resting left ankle-brachial index indicates moderate left lower extremity arterial disease. The left toe-brachial index is abnormal. Duplex comfirms left SFA stents occluded again s/p thrombectomy of 01/18/2023. *See table(s) above for measurements and observations.  Electronically signed by Festus Barren MD on 02/09/2023 at 4:29:33 PM.    Final        Assessment & Plan:   1. 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  The patient reports that the claudication symptoms and leg pain has improved.   The patient denies lifestyle limiting changes at this point in time.  No further invasive studies, angiography or surgery at this time The patient should continue walking and begin a more formal exercise program.  The patient should continue antiplatelet therapy and aggressive treatment of the lipid abnormalities  Continued surveillance is indicated as atherosclerosis is likely to progress with time.    Patient should undergo noninvasive studies as ordered. The patient will follow up with me to review the studies.   2. Tobacco use disorder I had repeated conversation with the patient in regards to how smoking affects her peripheral arterial disease and how it places her at risk for amputation.  Patient is adamant that this is not true and continues to smoke.  3.  Diabetes mellitus due to underlying condition with hyperglycemia, unspecified whether long term insulin use (HCC) Continue hypoglycemic medications as already ordered, these medications have been reviewed and there are no changes at this time.  Hgb A1C to be monitored as already arranged by primary service  4. Hyperlipidemia, unspecified hyperlipidemia type Continue statin as ordered and reviewed, no changes at this time   Current Outpatient Medications on File Prior to Visit  Medication Sig Dispense Refill   acetaminophen (TYLENOL) 325 MG tablet Take 2 tablets (650 mg total) by mouth every 6 (six) hours as needed for mild pain (or Fever >/= 101).     albuterol (PROVENTIL HFA;VENTOLIN HFA) 108 (90 Base) MCG/ACT inhaler  Inhale 2 puffs into the lungs every 4 (four) hours as needed for wheezing or shortness of breath.     amitriptyline (ELAVIL) 100 MG tablet Take 10 mg by mouth at bedtime as needed for sleep.     aspirin EC 81 MG tablet Take 1 tablet (81 mg total) by mouth daily. Swallow whole. 30 tablet 0   clopidogrel (PLAVIX) 75 MG tablet Take 1 tablet (75 mg total) by mouth daily. 30 tablet 6   clopidogrel (PLAVIX) 75 MG tablet Take 1 tablet (75 mg total) by mouth daily. 30 tablet 11   Continuous Blood Gluc Sensor (FREESTYLE LIBRE 2 SENSOR) MISC USE AS DIRECTED EVERY 2 WEEKS     esomeprazole (NEXIUM) 20 MG capsule Take 20 mg by mouth daily at 12 noon.     FLUoxetine (PROZAC) 40 MG capsule Take 1 capsule by mouth daily.     glipiZIDE (GLUCOTROL) 10 MG tablet Take 10 mg by mouth daily as needed.     insulin NPH-regular Human (70-30) 100 UNIT/ML injection Inject 25-70 Units into the skin in the morning and at bedtime. 70 qam 25 qhs     lisinopril-hydrochlorothiazide (ZESTORETIC) 20-25 MG tablet Take 1 tablet by mouth daily.     tiZANidine (ZANAFLEX) 4 MG tablet Take 2-4 mg by mouth every 12 (twelve) hours as needed for muscle spasms.     triamcinolone cream (KENALOG) 0.1 % Apply topically.      atorvastatin (LIPITOR) 10 MG tablet Take 1 tablet (10 mg total) by mouth daily. 30 tablet 0   JARDIANCE 25 MG TABS tablet Take 25 mg by mouth daily.     OZEMPIC, 0.25 OR 0.5 MG/DOSE, 2 MG/1.5ML SOPN Inject 1.5 mLs into the skin daily.     No current facility-administered medications on file prior to visit.    There are no Patient Instructions on file for this visit. No follow-ups on file.   Georgiana Spinner, NP

## 2023-05-23 ENCOUNTER — Other Ambulatory Visit: Payer: Self-pay

## 2023-05-23 ENCOUNTER — Emergency Department: Payer: 59

## 2023-05-23 ENCOUNTER — Encounter: Payer: Self-pay | Admitting: Emergency Medicine

## 2023-05-23 ENCOUNTER — Emergency Department
Admission: EM | Admit: 2023-05-23 | Discharge: 2023-05-23 | Disposition: A | Discharge status: Home / Self Care | Payer: 59 | Source: Home / Self Care | Attending: Emergency Medicine | Admitting: Emergency Medicine

## 2023-05-23 DIAGNOSIS — I10 Essential (primary) hypertension: Secondary | ICD-10-CM | POA: Diagnosis not present

## 2023-05-23 DIAGNOSIS — E119 Type 2 diabetes mellitus without complications: Secondary | ICD-10-CM | POA: Diagnosis not present

## 2023-05-23 DIAGNOSIS — R519 Headache, unspecified: Secondary | ICD-10-CM

## 2023-05-23 DIAGNOSIS — H61019 Acute perichondritis of external ear, unspecified ear: Secondary | ICD-10-CM | POA: Insufficient documentation

## 2023-05-23 DIAGNOSIS — I2510 Atherosclerotic heart disease of native coronary artery without angina pectoris: Secondary | ICD-10-CM | POA: Insufficient documentation

## 2023-05-23 DIAGNOSIS — M948X9 Other specified disorders of cartilage, unspecified sites: Secondary | ICD-10-CM

## 2023-05-23 LAB — CBC WITH DIFFERENTIAL/PLATELET
Abs Immature Granulocytes: 0.04 10*3/uL (ref 0.00–0.07)
Basophils Absolute: 0 10*3/uL (ref 0.0–0.1)
Basophils Relative: 0 %
Eosinophils Absolute: 0.1 10*3/uL (ref 0.0–0.5)
Eosinophils Relative: 1 %
HCT: 43 % (ref 36.0–46.0)
Hemoglobin: 14.2 g/dL (ref 12.0–15.0)
Immature Granulocytes: 0 %
Lymphocytes Relative: 39 %
Lymphs Abs: 3.9 10*3/uL (ref 0.7–4.0)
MCH: 30.9 pg (ref 26.0–34.0)
MCHC: 33 g/dL (ref 30.0–36.0)
MCV: 93.5 fL (ref 80.0–100.0)
Monocytes Absolute: 0.6 10*3/uL (ref 0.1–1.0)
Monocytes Relative: 6 %
Neutro Abs: 5.3 10*3/uL (ref 1.7–7.7)
Neutrophils Relative %: 54 %
Platelets: 419 10*3/uL — ABNORMAL HIGH (ref 150–400)
RBC: 4.6 MIL/uL (ref 3.87–5.11)
RDW: 14 % (ref 11.5–15.5)
WBC: 9.9 10*3/uL (ref 4.0–10.5)
nRBC: 0 % (ref 0.0–0.2)

## 2023-05-23 LAB — BASIC METABOLIC PANEL
Anion gap: 9 (ref 5–15)
BUN: 11 mg/dL (ref 8–23)
CO2: 25 mmol/L (ref 22–32)
Calcium: 9.4 mg/dL (ref 8.9–10.3)
Chloride: 102 mmol/L (ref 98–111)
Creatinine, Ser: 0.75 mg/dL (ref 0.44–1.00)
GFR, Estimated: >60 mL/min (ref >60)
Glucose, Bld: 186 mg/dL — ABNORMAL HIGH (ref 70–99)
Potassium: 3.7 mmol/L (ref 3.5–5.1)
Sodium: 136 mmol/L (ref 135–145)

## 2023-05-23 LAB — C-REACTIVE PROTEIN: CRP: 0.5 mg/dL (ref <1.0)

## 2023-05-23 LAB — SEDIMENTATION RATE: Sed Rate: 25 mm/hr (ref 0–30)

## 2023-05-23 MED ORDER — CIPROFLOXACIN HCL 250 MG PO TABS: 750.0000 mg | ORAL_TABLET | Freq: Two times a day (BID) | ORAL | 0 refills | Status: AC

## 2023-05-23 MED ORDER — ACETAMINOPHEN 500 MG PO TABS
1000.0000 mg | ORAL_TABLET | Freq: Once | ORAL | Status: AC
  Administered 2023-05-23: 1000 mg via ORAL
  Filled 2023-05-23: qty 2

## 2023-05-23 MED ORDER — ACETAMINOPHEN 500 MG PO TABS: 1000.0000 mg | ORAL_TABLET | Freq: Four times a day (QID) | ORAL | Status: DC | PRN

## 2023-05-23 MED ORDER — MORPHINE SULFATE (PF) 2 MG/ML IV SOLN
2.0000 mg | Freq: Once | INTRAVENOUS | Status: AC
  Administered 2023-05-23: 2 mg via INTRAVENOUS
  Filled 2023-05-23: qty 1

## 2023-05-23 MED ORDER — TETRACAINE HCL 0.5 % OP SOLN
1.0000 [drp] | Freq: Once | OPHTHALMIC | Status: AC
  Administered 2023-05-23: 1 [drp] via OPHTHALMIC
  Filled 2023-05-23: qty 4

## 2023-05-23 MED ORDER — IOHEXOL 300 MG/ML  SOLN
75.0000 mL | Freq: Once | INTRAMUSCULAR | Status: AC | PRN
  Administered 2023-05-23: 75 mL via INTRAVENOUS

## 2023-05-23 MED ORDER — CIPROFLOXACIN HCL 500 MG PO TABS
750.0000 mg | ORAL_TABLET | Freq: Once | ORAL | Status: AC
  Administered 2023-05-23: 750 mg via ORAL
  Filled 2023-05-23: qty 2

## 2023-05-23 NOTE — ED Provider Notes (Signed)
[facility] Provider Note    Event Date/Time   First MD Initiated Contact with Patient 05/23/23 [phone]     (approximate)   History   Facial Pain   HPI  Kristie Olson is a 67 y.o. female past medical history significant for diabetes, CAD, hypertension, hyperlipidemia, PAD, who presents to the emergency department with left-sided face pain.  Patient states that she started having pain to the left side of her face that started yesterday and has progressively worsening since that time.  States that her pain radiates to her entire head and goes across her face.  No prior similar symptoms in the past.  Wears glasses and states that she believes her vision has been worse over the past 2 days with decreased vision in the left eye.  Denies any jaw claudication.  No pain with eating.  Denies any chest pain.  Feels like her left face/ear is swollen.  States that it is warm and red to touch.  Denies any falls or trauma.  Denies any injury.  Later stated that she no longer feels like she has any change in vision.     Physical Exam   Triage Vital Signs: ED Triage Vitals  Enc Vitals Group     BP 05/23/23 0241 (!) 154/84     Pulse Rate 05/23/23 0241 95     Resp 05/23/23 0241 (!) 21     Temp 05/23/23 0240 98.3 F (36.8 C)     Temp Source 05/23/23 0240 Oral     SpO2 05/23/23 0241 95 %     Weight 05/23/23 0242 207 lb (93.9 kg)     Height 05/23/23 0242 5' (1.524 m)     Head Circumference --      Peak Flow --      Pain Score 05/23/23 0241 10     Pain Loc --      Pain Edu? --      Excl. in GC? --     Most recent vital signs: Vitals:   05/23/23 0241 05/23/23 0610  BP: (!) 154/84 (!) 143/65  Pulse: 95 69  Resp: (!) 21 18  Temp:    SpO2: 95% 99%    Physical Exam Constitutional:      Appearance: She is well-developed.  HENT:     Head: Atraumatic.  Eyes:     Conjunctiva/sclera: Conjunctivae normal.     Comments: IOP 19 bilaterally.  Attempted to get visual  acuity however patient's husband took her glasses and unable to obtain an accurate visual acuity.  Cardiovascular:     Rate and Rhythm: Regular rhythm.  Pulmonary:     Effort: No respiratory distress.  Abdominal:     General: There is no distension.  Musculoskeletal:        General: Normal range of motion.     Cervical back: Normal range of motion. No tenderness.  Skin:    General: Skin is warm.     Comments: Erythema, swelling and tenderness to the auricle w/o fluctuance or purulent drainage.  Soft tissue swelling to the left lateral neck.  Mild tenderness palpation to bilateral temporal area.  Neurological:     Mental Status: She is alert. Mental status is at baseline.     GCS: GCS eye subscore is 4. GCS verbal subscore is 5. GCS motor subscore is 6.     Cranial Nerves: Cranial nerves 2-12 are intact.     Sensory: Sensation is intact.  Motor: Motor function is intact.     Coordination: Coordination is intact.     IMPRESSION / MDM / ASSESSMENT AND PLAN / ED COURSE  I reviewed the triage vital signs and the nursing notes.  Differential diagnosis including abscess, giant cell arteritis, perichondritis.  Low suspicion for mastoiditis, no acute otitis media, no signs of malignant otitis externa on exam.   EKG  I, Corena Herter, the attending physician, personally viewed and interpreted this ECG.   Rate: Normal  Rhythm: Normal sinus  Axis: Normal  Intervals: Normal  ST&T Change: None  No tachycardic or bradycardic dysrhythmias while on cardiac telemetry.  RADIOLOGY I independently reviewed imaging, my interpretation of imaging: Soft tissue swelling to the left neck.  Soft tissue swelling to the left earlobe without collection.  No underlying signs of parotiditis.  CT scan of the head with no acute findings.  LABS (all labs ordered are listed, but only abnormal results are displayed) Labs interpreted as -    Labs Reviewed  CBC WITH DIFFERENTIAL/PLATELET - Abnormal;  Notable for the following components:      Result Value   Platelets 419 (*)    All other components within normal limits  BASIC METABOLIC PANEL - Abnormal; Notable for the following components:   Glucose, Bld 186 (*)    All other components within normal limits  SEDIMENTATION RATE  C-REACTIVE PROTEIN     MDM    No significant leukocytosis.  ESR within normal limits have a low suspicion for giant cell arteritis.  No signs of an abscess on CT scan.  Concern for possible acute auricular perichondritis.  No signs of malignant otitis externa or mastoiditis.  No signs of an abscess or hematoma.  Intraocular pressures are within normal limits, clinical picture is not consistent with glaucoma.  Extraocular movements intact and no change in vision on reevaluation.  States that her vision at her normal.  Clinical picture is not consistent with a cavernous sinus thrombosis.  Patient started on ciprofloxacin.  Given Tylenol for pain control.  Will start on ciprofloxacin for Pseudomonas coverage and given information to follow-up with ENT. Given information to follow-up with primary care provider and return precautions for any ongoing or worsening symptoms.   PROCEDURES:  Critical Care performed: No  Procedures  Patient's presentation is most consistent with acute presentation with potential threat to life or bodily function.   MEDICATIONS ORDERED IN ED: Medications  tetracaine (PONTOCAINE) 0.5 % ophthalmic solution 1 drop (has no administration in time range)  ciprofloxacin (CIPRO) tablet 750 mg (has no administration in time range)  acetaminophen (TYLENOL) tablet 1,000 mg (has no administration in time range)  acetaminophen (TYLENOL) tablet 1,000 mg (has no administration in time range)  morphine (PF) 2 MG/ML injection 2 mg (2 mg Intravenous Given 05/23/23 0404)  iohexol (OMNIPAQUE) 300 MG/ML solution 75 mL (75 mLs Intravenous Contrast Given 05/23/23 0502)    FINAL CLINICAL IMPRESSION(S) / ED  DIAGNOSES   Final diagnoses:  Facial pain  Perichondritis     Rx / DC Orders   ED Discharge Orders          Ordered    ciprofloxacin (CIPRO) 250 MG tablet  2 times daily        05/23/23 0729             Note:  This document was prepared using Dragon voice recognition software and may include unintentional dictation errors.   Corena Herter, MD 05/23/23 [id number]

## 2023-05-23 NOTE — ED Notes (Signed)
D/C and new RX and when to take RX discussed with pt, pt and husband verbalized understanding. NAD noted.

## 2023-05-23 NOTE — Discharge Instructions (Signed)
You are seen in the emergency department for left-sided facial pain and swelling.  You had a CT scan that showed significant amount of inflammation to the left ear and left side of the face.  No signs of an abscess.  Your CT scan of your head was normal.  Your lab work was normal.  It is importantly follow-up closely and call your primary care physician today to schedule close follow-up appointment.  You are given information for follow-up with ENT.  Return to the emergency department if you have any worsening symptoms.  You are given a prescription for ciprofloxacin, it is important that you take this medication twice a day.  You are given a prescription for Tylenol.  Pain control:  Acetaminophen (tylenol) - You can take 2 extra strength tablets (1000 mg) every 6 hours as needed for pain/fever.

## 2023-05-23 NOTE — ED Triage Notes (Signed)
Pt to ED via POV with c/o pain to posterior L ear lobe with pain that radiates to L side of her face and neck at this time. Pt states symptoms started yesterday.   Pt states pain radiates into L eye and temple from posterior L ear.

## 2023-06-03 ENCOUNTER — Emergency Department
Admission: EM | Admit: 2023-06-03 | Discharge: 2023-06-03 | Disposition: A | Discharge status: Home / Self Care | Payer: 59 | Attending: Emergency Medicine | Admitting: Emergency Medicine

## 2023-06-03 ENCOUNTER — Other Ambulatory Visit: Payer: Self-pay

## 2023-06-03 DIAGNOSIS — S0591XA Unspecified injury of right eye and orbit, initial encounter: Secondary | ICD-10-CM | POA: Diagnosis present

## 2023-06-03 DIAGNOSIS — I10 Essential (primary) hypertension: Secondary | ICD-10-CM | POA: Diagnosis not present

## 2023-06-03 DIAGNOSIS — S0501XA Injury of conjunctiva and corneal abrasion without foreign body, right eye, initial encounter: Secondary | ICD-10-CM | POA: Diagnosis not present

## 2023-06-03 DIAGNOSIS — X58XXXA Exposure to other specified factors, initial encounter: Secondary | ICD-10-CM | POA: Diagnosis not present

## 2023-06-03 DIAGNOSIS — J45909 Unspecified asthma, uncomplicated: Secondary | ICD-10-CM | POA: Insufficient documentation

## 2023-06-03 DIAGNOSIS — I2510 Atherosclerotic heart disease of native coronary artery without angina pectoris: Secondary | ICD-10-CM | POA: Insufficient documentation

## 2023-06-03 DIAGNOSIS — S0502XA Injury of conjunctiva and corneal abrasion without foreign body, left eye, initial encounter: Secondary | ICD-10-CM | POA: Insufficient documentation

## 2023-06-03 DIAGNOSIS — E119 Type 2 diabetes mellitus without complications: Secondary | ICD-10-CM | POA: Diagnosis not present

## 2023-06-03 DIAGNOSIS — H6002 Abscess of left external ear: Secondary | ICD-10-CM | POA: Insufficient documentation

## 2023-06-03 LAB — CBG MONITORING, ED: Glucose-Capillary: 83 mg/dL (ref 70–99)

## 2023-06-03 MED ORDER — SULFAMETHOXAZOLE-TRIMETHOPRIM 800-160 MG PO TABS: 1.0000 | ORAL_TABLET | Freq: Two times a day (BID) | ORAL | 0 refills | Status: DC

## 2023-06-03 MED ORDER — SULFAMETHOXAZOLE-TRIMETHOPRIM 800-160 MG PO TABS: 1.0000 | ORAL_TABLET | Freq: Two times a day (BID) | ORAL | 0 refills | Status: AC

## 2023-06-03 MED ORDER — FLUORESCEIN SODIUM 1 MG OP STRP
1.0000 | ORAL_STRIP | Freq: Once | OPHTHALMIC | Status: AC
  Administered 2023-06-03: 1 via OPHTHALMIC

## 2023-06-03 MED ORDER — OFLOXACIN 0.3 % OP SOLN: 2.0000 [drp] | Freq: Four times a day (QID) | OPHTHALMIC | 0 refills | Status: AC

## 2023-06-03 MED ORDER — OFLOXACIN 0.3 % OP SOLN: 2.0000 [drp] | Freq: Four times a day (QID) | OPHTHALMIC | 0 refills | Status: DC

## 2023-06-03 MED ORDER — TETRACAINE HCL 0.5 % OP SOLN
2.0000 [drp] | Freq: Once | OPHTHALMIC | Status: AC
  Administered 2023-06-03: 2 [drp] via OPHTHALMIC
  Filled 2023-06-03: qty 4

## 2023-06-03 MED ORDER — LIDOCAINE HCL (PF) 1 % IJ SOLN
5.0000 mL | Freq: Once | INTRAMUSCULAR | Status: AC
  Administered 2023-06-03: 5 mL via INTRADERMAL
  Filled 2023-06-03: qty 5

## 2023-06-03 MED ORDER — LIDOCAINE-EPINEPHRINE-TETRACAINE (LET) TOPICAL GEL
3.0000 mL | Freq: Once | TOPICAL | Status: AC
  Administered 2023-06-03: 3 mL via TOPICAL

## 2023-06-03 MED ORDER — ACETAMINOPHEN 325 MG PO TABS
650.0000 mg | ORAL_TABLET | Freq: Once | ORAL | Status: AC
  Administered 2023-06-03: 650 mg via ORAL
  Filled 2023-06-03: qty 2

## 2023-06-03 NOTE — ED Provider Notes (Signed)
[facility] Provider Note    Event Date/Time   First MD Initiated Contact with Patient 06/03/23 1734     (approximate)   History   Otalgia   HPI  Kristie Olson is a 67 y.o. female with PMH of PAD, diabetes, CAD, asthma and HTN who presents for evaluation of ongoing left ear pain.  Patient was seen by this ED on 7/8 and was given a course of ciprofloxacin which she completed.  Patient states that this has not resolved her pain and she has noticed more swelling.  She denies any discharge and hearing loss.  Patient is also reporting changes in her vision that started yesterday.  She states that she cannot see and describes it as difficulty reading small text because it is blurry.  She also reports feeling like there is dirt in both of her eyes.  She has been using Pataday eyedrops daily for allergies.  Patient has had cataract surgery.     Physical Exam   Triage Vital Signs: ED Triage Vitals  Encounter Vitals Group     BP 06/03/23 1605 (!) 161/73     Systolic BP Percentile --      Diastolic BP Percentile --      Pulse Rate 06/03/23 1605 81     Resp 06/03/23 1605 18     Temp 06/03/23 1605 98.4 F (36.9 C)     Temp src --      SpO2 06/03/23 1605 99 %     Weight 06/03/23 1604 218 lb 0.2 oz (98.9 kg)     Height 06/03/23 1604 5' (1.524 m)     Head Circumference --      Peak Flow --      Pain Score 06/03/23 1604 10     Pain Loc --      Pain Education --      Exclude from Growth Chart --     Most recent vital signs: Vitals:   06/03/23 1605  BP: (!) 161/73  Pulse: 81  Resp: 18  Temp: 98.4 F (36.9 C)  SpO2: 99%    General: Awake, no distress.  CV:  Good peripheral perfusion.  RRR. Resp:  Normal effort.  CTAB. Abd:  No distention.  Other:  4 cm long mass on the back of patient's left ear, warmth, tender and fluctuant, left TM is translucent, EAC is clear.  PERRL.  EOM intact.   ED Results / Procedures / Treatments   Labs (all labs  ordered are listed, but only abnormal results are displayed) Labs Reviewed  CBG MONITORING, ED     PROCEDURES:  Critical Care performed: No  ..Incision and Drainage  Date/Time: 06/03/2023 8:16 PM  Performed by: Cameron Ali, PA-C Authorized by: Cameron Ali, PA-C   Consent:    Consent obtained:  Verbal   Consent given by:  Patient   Risks, benefits, and alternatives were discussed: yes     Risks discussed:  Bleeding, incomplete drainage and pain   Alternatives discussed:  No treatment Universal protocol:    Patient identity confirmed:  Verbally with patient Location:    Type:  Abscess   Size:  4 cm   Location:  Head   Head location:  L external ear Pre-procedure details:    Skin preparation:  Chlorhexidine Sedation:    Sedation type:  None Anesthesia:    Anesthesia method:  Local infiltration and topical application   Topical anesthetic:  LET   Local anesthetic:  Lidocaine 1% w/o epi Procedure type:    Complexity:  Simple Procedure details:    Ultrasound guidance: no     Needle aspiration: no     Incision types:  Single straight   Incision depth:  Dermal   Wound management:  Probed and deloculated and irrigated with saline   Drainage:  Purulent   Drainage amount:  Moderate   Wound treatment:  Wound left open   Packing materials:  None Post-procedure details:    Procedure completion:  Tolerated    MEDICATIONS ORDERED IN ED: Medications  lidocaine-EPINEPHrine-tetracaine (LET) topical gel (3 mLs Topical Given 06/03/23 1816)  tetracaine (PONTOCAINE) 0.5 % ophthalmic solution 2 drop (2 drops Both Eyes Given by Other 06/03/23 1815)  fluorescein ophthalmic strip 1 strip (1 strip Both Eyes Given by Other 06/03/23 1815)  lidocaine (PF) (XYLOCAINE) 1 % injection 5 mL (5 mLs Intradermal Given by Other 06/03/23 2000)     IMPRESSION / MDM / ASSESSMENT AND PLAN / ED COURSE  I reviewed the triage vital signs and the nursing notes.                              8 y F presents for evaluation of ear pain and blurry vision.  BP elevated in triage otherwise VSS, patient does have a history of HTN.  Patient in mild distress due to her ear pain on exam.  Differential diagnosis includes, but is not limited to, abscess, corneal abrasion, mastoiditis, AOM.  Patient's presentation is most consistent with acute complicated illness / injury requiring diagnostic workup.  Visual screening completed, 20/200 R eye amd 20/50 L eye. Patient reports this is her normal. Fluorescein dye exam completed due to patient describing a foreign body sensation.  Patient has bilateral corneal abrasions to the inferior iris at about 6:00.  Patient will be prescribed antibiotic eyedrops and given follow-up with ophthalmology.  Bedside ultrasound used to evaluate the mass on patient's posterior left ear.  Ultrasound showed a fluid collection.  Abscess was drained as detailed in the procedure note above.  Patient will be prescribed an oral antibiotic and given follow-up with ENT.  I advised patient to take Tylenol as needed for her pain.  I instructed her on wound care, I advised her to keep it covered with gauze for 24 hours.  Then she can shower like normal and cover it with a bandage. Patient given a course of oral abx and ENT follow up if she continues to have symptoms.   Patient was agreeable to plan, voiced understanding and was stable at discharge.       FINAL CLINICAL IMPRESSION(S) / ED DIAGNOSES   Final diagnoses:  Abscess of left external ear  Bilateral corneal abrasions, initial encounter     Rx / DC Orders   ED Discharge Orders          Ordered    ofloxacin (OCUFLOX) 0.3 % ophthalmic solution  4 times daily        06/03/23 2011    sulfamethoxazole-trimethoprim (BACTRIM DS) 800-160 MG tablet  2 times daily        06/03/23 2011             Note:  This document was prepared using Dragon voice recognition software and may include unintentional dictation  errors.   Cameron Ali, PA-C 06/03/23 2041    Minna Antis, MD 06/07/23 1419

## 2023-06-03 NOTE — ED Provider Notes (Signed)
I have seen and evaluated the patient in conjunction with the physician assistant.  Patient does appear to have an abscess behind her left ear, ultrasound confirms fluid accumulation/pocket.  No sign of mastoiditis no sign of other ear infection.  We will perform incision and drainage place patient on antibiotics and have her follow-up with ENT for further evaluation going forward.  Patient agreeable to plan.   Minna Antis, MD 06/03/23 1901

## 2023-06-03 NOTE — ED Notes (Signed)
Patient is going to lie down in her Zenaida Niece. Patient states she will send her husband back in to check if it's time for her to get to a room.

## 2023-06-03 NOTE — Discharge Instructions (Addendum)
Your eye exam showed scratches on both of your eyes.  I have prescribed an antibiotic eyedrop to treat this.  You will need to place 2 drops into both of your eyes 4 times daily for 7 days.  You will need to follow-up with ophthalmology on Monday. This is an eye doctor. Tell them you have bilateral corneal abrasions.   You had an abscess on the back of your ear which was drained at your visit today.  Please take the antibiotic Bactrim 2 times a day for 5 days. Please follow up with the ear doctor if you do not have an improvement in your pain or if it begins to swell again. Keep the gauze on and keep it dry for 24 hours, then you can remove the gauze and shower. Keep it covered with a bandage for the next few days as it may continue to drain. You can take 650 mg of tylenol every 6 hours for pain.

## 2023-06-03 NOTE — ED Triage Notes (Signed)
Pt reports left ear pain xfew days, pt completed course of abx with no relief.

## 2023-06-03 NOTE — ED Notes (Signed)
Vision acuity tested, 20/200 R eye, 20/50 L eye

## 2023-06-03 NOTE — ED Notes (Signed)
See triage notes. Patient c/o left ear pain and swelling. Patient was previously treated for an infection to the same ear.

## 2023-06-17 ENCOUNTER — Other Ambulatory Visit (INDEPENDENT_AMBULATORY_CARE_PROVIDER_SITE_OTHER): Payer: Self-pay | Admitting: Nurse Practitioner

## 2023-06-17 DIAGNOSIS — Z9889 Other specified postprocedural states: Secondary | ICD-10-CM

## 2023-06-21 ENCOUNTER — Ambulatory Visit (INDEPENDENT_AMBULATORY_CARE_PROVIDER_SITE_OTHER): Payer: 59 | Admitting: Nurse Practitioner

## 2023-06-21 ENCOUNTER — Encounter (INDEPENDENT_AMBULATORY_CARE_PROVIDER_SITE_OTHER): Payer: Self-pay

## 2023-06-21 ENCOUNTER — Ambulatory Visit (INDEPENDENT_AMBULATORY_CARE_PROVIDER_SITE_OTHER): Payer: 59

## 2023-06-21 ENCOUNTER — Encounter (INDEPENDENT_AMBULATORY_CARE_PROVIDER_SITE_OTHER): Payer: Self-pay | Admitting: Nurse Practitioner

## 2023-06-21 VITALS — BP 101/60 | HR 76 | Resp 18 | Ht 60.0 in | Wt 218.0 lb

## 2023-06-21 DIAGNOSIS — I739 Peripheral vascular disease, unspecified: Secondary | ICD-10-CM

## 2023-06-21 DIAGNOSIS — E785 Hyperlipidemia, unspecified: Secondary | ICD-10-CM | POA: Diagnosis not present

## 2023-06-21 DIAGNOSIS — F17200 Nicotine dependence, unspecified, uncomplicated: Secondary | ICD-10-CM | POA: Diagnosis not present

## 2023-06-21 DIAGNOSIS — E0865 Diabetes mellitus due to underlying condition with hyperglycemia: Secondary | ICD-10-CM

## 2023-06-21 DIAGNOSIS — Z9889 Other specified postprocedural states: Secondary | ICD-10-CM | POA: Diagnosis not present

## 2023-06-21 DIAGNOSIS — I70222 Atherosclerosis of native arteries of extremities with rest pain, left leg: Secondary | ICD-10-CM

## 2023-06-21 MED ORDER — CLOPIDOGREL BISULFATE 75 MG PO TABS
75.0000 mg | ORAL_TABLET | Freq: Every day | ORAL | 3 refills | Status: DC
Start: 2023-06-21 — End: 2023-12-12

## 2023-06-21 MED ORDER — OXYCODONE HCL 5 MG PO TABS: 5.0000 mg | ORAL_TABLET | Freq: Three times a day (TID) | ORAL | 0 refills | Status: DC | PRN

## 2023-06-21 MED ORDER — CLOPIDOGREL BISULFATE 75 MG PO TABS
75.0000 mg | ORAL_TABLET | Freq: Every day | ORAL | 0 refills | Status: DC
Start: 2023-06-21 — End: 2024-03-22

## 2023-06-21 NOTE — Progress Notes (Signed)
Subjective:    Patient ID: Kristie Olson, female    DOB: 02-24-1956, 67 y.o.   MRN: [id number] Chief Complaint  Patient presents with  . Follow-up    3 month ABI Follow up     Kristie Olson is a 67 year old female who presents today for follow-up evaluation of her peripheral arterial disease.  She notes that approximately a week and a half ago she began to have severe worsening claudication-like symptoms in her left lower extremity.  She also notes that she has begun to develop rest pain.  She has known lower back issues which also cause some difficulty with her ambulation claudication symptoms but she noted that it was much worse suddenly.  She notes that prior she was taking place she lost her Plavix.  She is not taking it and has not done so for at least a week or 2 prior to the symptoms beginning.   Review of Systems  Cardiovascular:  Positive for leg swelling.       Claudication  Neurological:  Positive for weakness.  Psychiatric/Behavioral:  The patient is nervous/anxious.   All other systems reviewed and are negative.      Objective:   Physical Exam Vitals reviewed.  HENT:     Head: Normocephalic.  Cardiovascular:     Rate and Rhythm: Normal rate.     Pulses:          Dorsalis pedis pulses are detected w/ Doppler on the left side.       Posterior tibial pulses are detected w/ Doppler on the left side.  Pulmonary:     Effort: Pulmonary effort is normal.  Musculoskeletal:     Right lower leg: Edema present.  Skin:    General: Skin is warm and dry.  Neurological:     Mental Status: She is alert and oriented to person, place, and time.     Gait: Gait abnormal.  Psychiatric:        Mood and Affect: Mood normal.        Behavior: Behavior normal.        Thought Content: Thought content normal.        Judgment: Judgment normal.    BP 101/60 (BP Location: Left Arm)   Pulse 76   Resp 18   Ht 5' (1.524 m)   Wt 218 lb (98.9 kg)   BMI 42.58 kg/m   Past Medical  History:  Diagnosis Date  . Asthma   . Coronary artery disease   . Diabetes mellitus without complication (HCC) 1990  . Fibromyalgia   . Hypertension   . PAD (peripheral artery disease) (HCC) 09/03/2022    Social History   Socioeconomic History  . Marital status: Married    Spouse name: Not on file  . Number of children: Not on file  . Years of education: Not on file  . Highest education level: Not on file  Occupational History  . Not on file  Tobacco Use  . Smoking status: Every Day    Current packs/day: 1.00    Average packs/day: 1 pack/day for 46.0 years (46.0 ttl pk-yrs)    Types: Cigarettes  . Smokeless tobacco: Never  Vaping Use  . Vaping status: Never Used  Substance and Sexual Activity  . Alcohol use: No  . Drug use: Not Currently  . Sexual activity: Not on file  Other Topics Concern  . Not on file  Social History Narrative   Lives with husband, Oralia Manis and son, Ivin Booty.  No pets.   Social Determinants of Health   Financial Resource Strain: Medium Risk (09/16/2020)   Received from [facility], [facility]   Overall Financial Resource Strain (CARDIA)   . Difficulty of Paying Living Expenses: Somewhat hard  Food Insecurity: No Food Insecurity (09/03/2022)   Hunger Vital Sign   . Worried About Programme researcher, broadcasting/film/video in the Last Year: Never true   . Ran Out of Food in the Last Year: Never true  Transportation Needs: No Transportation Needs (09/03/2022)   PRAPARE - Transportation   . Lack of Transportation (Medical): No   . Lack of Transportation (Non-Medical): No  Physical Activity: Not on file  Stress: Not on file  Social Connections: Not on file  Intimate Partner Violence: Not At Risk (09/03/2022)   Humiliation, Afraid, Rape, and Kick questionnaire   . Fear of Current or Ex-Partner: No   . Emotionally Abused: No   . Physically Abused: No   . Sexually Abused: No    Past Surgical History:  Procedure Laterality Date  . BREAST EXCISIONAL BIOPSY Left  1980's   neg  . BREAST MASS EXCISION Left    age 89"s, benign  . CESAREAN SECTION    . CHOLECYSTECTOMY  2009  . LOWER EXTREMITY ANGIOGRAPHY Left 09/03/2022   Procedure: Lower Extremity Angiography;  Surgeon: Annice Needy, MD;  Location: [facility];  Service: Cardiovascular;  Laterality: Left;  . LOWER EXTREMITY ANGIOGRAPHY Right 09/08/2022   Procedure: Lower Extremity Angiography;  Surgeon: Annice Needy, MD;  Location: [facility];  Service: Cardiovascular;  Laterality: Right;  . LOWER EXTREMITY ANGIOGRAPHY Left 01/18/2023   Procedure: Lower Extremity Angiography;  Surgeon: Renford Dills, MD;  Location: [facility];  Service: Cardiovascular;  Laterality: Left;  . LOWER EXTREMITY ANGIOGRAPHY Left 02/14/2023   Procedure: Lower Extremity Angiography;  Surgeon: Annice Needy, MD;  Location: [facility];  Service: Cardiovascular;  Laterality: Left;    Family History  Problem Relation Age of Onset  . Breast cancer Sister 32  . Breast cancer Paternal Aunt     Allergies  Allergen Reactions  . Ibuprofen Other (See Comments)    STOMACHACHE  . Nsaids     Other reaction(s): Other (See Comments) PUD  . Percocet [Oxycodone-Acetaminophen] Itching    Patient states "Percocet makes me itch all over"       Latest Ref Rng & Units 05/23/2023    2:44 AM 09/09/2022    5:31 AM 09/08/2022    4:47 AM  CBC  WBC 4.0 - 10.5 K/uL 9.9  9.2  7.4   Hemoglobin 12.0 - 15.0 g/dL 16.1  09.6  04.5   Hematocrit 36.0 - 46.0 % 43.0  36.2  37.9   Platelets 150 - 400 K/uL 419  438  420       CMP     Component Value Date/Time   NA 136 05/23/2023 0244   NA 132 (L) 07/14/2013 2143   K 3.7 05/23/2023 0244   K 4.3 07/14/2013 2143   CL 102 05/23/2023 0244   CL 95 (L) 07/14/2013 2143   CO2 25 05/23/2023 0244   CO2 31 07/14/2013 2143   GLUCOSE 186 (H) 05/23/2023 0244   GLUCOSE 433 (H) 07/14/2013 2143   BUN 11 05/23/2023 0244   BUN 12 07/14/2013 2143   CREATININE 0.75  05/23/2023 0244   CREATININE 0.91 07/14/2013 2143   CALCIUM 9.4 05/23/2023 0244   CALCIUM 9.3  07/14/2013 2143   PROT 8.2 (H) 04/28/2020 0423   PROT 7.9 07/14/2013 2143   ALBUMIN 4.3 04/28/2020 0423   ALBUMIN 3.7 07/14/2013 2143   AST 17 04/28/2020 0423   AST 16 07/14/2013 2143   ALT 14 04/28/2020 0423   ALT 20 07/14/2013 2143   ALKPHOS 89 04/28/2020 0423   ALKPHOS 118 07/14/2013 2143   BILITOT 0.6 04/28/2020 0423   BILITOT 0.3 07/14/2013 2143   GFRNONAA >60 05/23/2023 0244   GFRNONAA >60 07/14/2013 2143     No results found.     Assessment & Plan:   1. 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  She is reminded of the importance of continuing taking her Plavix daily.  We sent a 1 month refill into [facility] we will send further refills into her mail order pharmacy.  We also sent in pain medication as well.  The patient was advised that if the pain becomes unbearable she is welcome to proceed to the emergency room for sooner treatment evaluation, otherwise we will plan on getting her scheduled for angiogram as soon as possible.  Recommend:  The patient has evidence of severe atherosclerotic changes of both lower extremities with rest pain that is associated with preulcerative changes and impending tissue loss of the left foot.  This represents a limb threatening ischemia and places the patient at the risk for left limb loss.  Patient should undergo angiography of the left lower extremity with the hope for intervention for limb salvage.  The risks and benefits as well as the alternative therapies was discussed in detail with the patient.  All questions were answered.  Patient agrees to proceed with left lower extremity angiography.  The patient will follow up with me in the office after the procedure.      2. Tobacco use  disorder Smoking cessation was discussed, 3-10 minutes spent on this topic specifically  3. Diabetes mellitus due to underlying condition with hyperglycemia, unspecified whether long term insulin use (HCC) Continue hypoglycemic medications as already ordered, these medications have been reviewed and there are no changes at this time.  Hgb A1C to be monitored as already arranged by primary service  4. Hyperlipidemia, unspecified hyperlipidemia type Continue statin as ordered and reviewed, no changes at this time   Current Outpatient Medications on File Prior to Visit  Medication Sig Dispense Refill  . acetaminophen (TYLENOL) 325 MG tablet Take 2 tablets (650 mg total) by mouth every 6 (six) hours as needed for mild pain (or Fever >/= 101).    . ADVAIR DISKUS 250-50 MCG/ACT AEPB Inhale 1 puff into the lungs in the morning and at bedtime.    Marland Kitchen albuterol (PROVENTIL HFA;VENTOLIN HFA) 108 (90 Base) MCG/ACT inhaler Inhale 2 puffs into the lungs every 4 (four) hours as needed for wheezing or shortness of breath.    Marland Kitchen amitriptyline (ELAVIL) 100 MG tablet Take 10 mg by mouth at bedtime as needed for sleep.    Marland Kitchen aspirin EC 81 MG tablet Take 1 tablet (81 mg total) by mouth daily. Swallow whole. 30 tablet 0  . Continuous Blood Gluc Sensor (FREESTYLE LIBRE 2 SENSOR) MISC USE AS DIRECTED EVERY 2 WEEKS    . esomeprazole (NEXIUM) 20 MG capsule Take 20 mg by mouth daily at 12 noon.    Marland Kitchen FLUoxetine (PROZAC) 40 MG capsule  Take 1 capsule by mouth daily.    Marland Kitchen glipiZIDE (GLUCOTROL) 10 MG tablet Take 10 mg by mouth daily as needed.    . insulin NPH-regular Human (70-30) 100 UNIT/ML injection Inject 25-70 Units into the skin in the morning and at bedtime. 70 qam 25 qhs    . JARDIANCE 25 MG TABS tablet Take 25 mg by mouth daily.    Marland Kitchen lisinopril-hydrochlorothiazide (ZESTORETIC) 20-25 MG tablet Take 1 tablet by mouth daily.    Marland Kitchen OZEMPIC, 0.25 OR 0.5 MG/DOSE, 2 MG/1.5ML SOPN Inject 1.5 mLs into the skin daily.    .  pantoprazole (PROTONIX) 40 MG tablet Take 40 mg by mouth daily.    Marland Kitchen tiZANidine (ZANAFLEX) 4 MG tablet Take 2-4 mg by mouth every 12 (twelve) hours as needed for muscle spasms.    Marland Kitchen triamcinolone cream (KENALOG) 0.1 % Apply topically.    Marland Kitchen atorvastatin (LIPITOR) 10 MG tablet Take 1 tablet (10 mg total) by mouth daily. 30 tablet 0   No current facility-administered medications on file prior to visit.    There are no Patient Instructions on file for this visit. No follow-ups on file.   Georgiana Spinner, NP

## 2023-06-21 NOTE — H&P (View-Only) (Signed)
 Subjective:    Patient ID: Kristie Olson, female    DOB: 02-24-1956, 67 y.o.   MRN: [id number] Chief Complaint  Patient presents with  . Follow-up    3 month ABI Follow up     Kristie Olson is a 67 year old female who presents today for follow-up evaluation of her peripheral arterial disease.  She notes that approximately a week and a half ago she began to have severe worsening claudication-like symptoms in her left lower extremity.  She also notes that she has begun to develop rest pain.  She has known lower back issues which also cause some difficulty with her ambulation claudication symptoms but she noted that it was much worse suddenly.  She notes that prior she was taking place she lost her Plavix.  She is not taking it and has not done so for at least a week or 2 prior to the symptoms beginning.   Review of Systems  Cardiovascular:  Positive for leg swelling.       Claudication  Neurological:  Positive for weakness.  Psychiatric/Behavioral:  The patient is nervous/anxious.   All other systems reviewed and are negative.      Objective:   Physical Exam Vitals reviewed.  HENT:     Head: Normocephalic.  Cardiovascular:     Rate and Rhythm: Normal rate.     Pulses:          Dorsalis pedis pulses are detected w/ Doppler on the left side.       Posterior tibial pulses are detected w/ Doppler on the left side.  Pulmonary:     Effort: Pulmonary effort is normal.  Musculoskeletal:     Right lower leg: Edema present.  Skin:    General: Skin is warm and dry.  Neurological:     Mental Status: She is alert and oriented to person, place, and time.     Gait: Gait abnormal.  Psychiatric:        Mood and Affect: Mood normal.        Behavior: Behavior normal.        Thought Content: Thought content normal.        Judgment: Judgment normal.    BP 101/60 (BP Location: Left Arm)   Pulse 76   Resp 18   Ht 5' (1.524 m)   Wt 218 lb (98.9 kg)   BMI 42.58 kg/m   Past Medical  History:  Diagnosis Date  . Asthma   . Coronary artery disease   . Diabetes mellitus without complication (HCC) 1990  . Fibromyalgia   . Hypertension   . PAD (peripheral artery disease) (HCC) 09/03/2022    Social History   Socioeconomic History  . Marital status: Married    Spouse name: Not on file  . Number of children: Not on file  . Years of education: Not on file  . Highest education level: Not on file  Occupational History  . Not on file  Tobacco Use  . Smoking status: Every Day    Current packs/day: 1.00    Average packs/day: 1 pack/day for 46.0 years (46.0 ttl pk-yrs)    Types: Cigarettes  . Smokeless tobacco: Never  Vaping Use  . Vaping status: Never Used  Substance and Sexual Activity  . Alcohol use: No  . Drug use: Not Currently  . Sexual activity: Not on file  Other Topics Concern  . Not on file  Social History Narrative   Lives with husband, Oralia Manis and son, Ivin Booty.  No pets.   Social Determinants of Health   Financial Resource Strain: Medium Risk (09/16/2020)   Received from [facility], [facility]   Overall Financial Resource Strain (CARDIA)   . Difficulty of Paying Living Expenses: Somewhat hard  Food Insecurity: No Food Insecurity (09/03/2022)   Hunger Vital Sign   . Worried About Programme researcher, broadcasting/film/video in the Last Year: Never true   . Ran Out of Food in the Last Year: Never true  Transportation Needs: No Transportation Needs (09/03/2022)   PRAPARE - Transportation   . Lack of Transportation (Medical): No   . Lack of Transportation (Non-Medical): No  Physical Activity: Not on file  Stress: Not on file  Social Connections: Not on file  Intimate Partner Violence: Not At Risk (09/03/2022)   Humiliation, Afraid, Rape, and Kick questionnaire   . Fear of Current or Ex-Partner: No   . Emotionally Abused: No   . Physically Abused: No   . Sexually Abused: No    Past Surgical History:  Procedure Laterality Date  . BREAST EXCISIONAL BIOPSY Left  1980's   neg  . BREAST MASS EXCISION Left    age 89"s, benign  . CESAREAN SECTION    . CHOLECYSTECTOMY  2009  . LOWER EXTREMITY ANGIOGRAPHY Left 09/03/2022   Procedure: Lower Extremity Angiography;  Surgeon: Annice Needy, MD;  Location: [facility];  Service: Cardiovascular;  Laterality: Left;  . LOWER EXTREMITY ANGIOGRAPHY Right 09/08/2022   Procedure: Lower Extremity Angiography;  Surgeon: Annice Needy, MD;  Location: [facility];  Service: Cardiovascular;  Laterality: Right;  . LOWER EXTREMITY ANGIOGRAPHY Left 01/18/2023   Procedure: Lower Extremity Angiography;  Surgeon: Renford Dills, MD;  Location: [facility];  Service: Cardiovascular;  Laterality: Left;  . LOWER EXTREMITY ANGIOGRAPHY Left 02/14/2023   Procedure: Lower Extremity Angiography;  Surgeon: Annice Needy, MD;  Location: [facility];  Service: Cardiovascular;  Laterality: Left;    Family History  Problem Relation Age of Onset  . Breast cancer Sister 32  . Breast cancer Paternal Aunt     Allergies  Allergen Reactions  . Ibuprofen Other (See Comments)    STOMACHACHE  . Nsaids     Other reaction(s): Other (See Comments) PUD  . Percocet [Oxycodone-Acetaminophen] Itching    Patient states "Percocet makes me itch all over"       Latest Ref Rng & Units 05/23/2023    2:44 AM 09/09/2022    5:31 AM 09/08/2022    4:47 AM  CBC  WBC 4.0 - 10.5 K/uL 9.9  9.2  7.4   Hemoglobin 12.0 - 15.0 g/dL 16.1  09.6  04.5   Hematocrit 36.0 - 46.0 % 43.0  36.2  37.9   Platelets 150 - 400 K/uL 419  438  420       CMP     Component Value Date/Time   NA 136 05/23/2023 0244   NA 132 (L) 07/14/2013 2143   K 3.7 05/23/2023 0244   K 4.3 07/14/2013 2143   CL 102 05/23/2023 0244   CL 95 (L) 07/14/2013 2143   CO2 25 05/23/2023 0244   CO2 31 07/14/2013 2143   GLUCOSE 186 (H) 05/23/2023 0244   GLUCOSE 433 (H) 07/14/2013 2143   BUN 11 05/23/2023 0244   BUN 12 07/14/2013 2143   CREATININE 0.75  05/23/2023 0244   CREATININE 0.91 07/14/2013 2143   CALCIUM 9.4 05/23/2023 0244   CALCIUM 9.3  07/14/2013 2143   PROT 8.2 (H) 04/28/2020 0423   PROT 7.9 07/14/2013 2143   ALBUMIN 4.3 04/28/2020 0423   ALBUMIN 3.7 07/14/2013 2143   AST 17 04/28/2020 0423   AST 16 07/14/2013 2143   ALT 14 04/28/2020 0423   ALT 20 07/14/2013 2143   ALKPHOS 89 04/28/2020 0423   ALKPHOS 118 07/14/2013 2143   BILITOT 0.6 04/28/2020 0423   BILITOT 0.3 07/14/2013 2143   GFRNONAA >60 05/23/2023 0244   GFRNONAA >60 07/14/2013 2143     No results found.     Assessment & Plan:   1. 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  She is reminded of the importance of continuing taking her Plavix daily.  We sent a 1 month refill into [facility] we will send further refills into her mail order pharmacy.  We also sent in pain medication as well.  The patient was advised that if the pain becomes unbearable she is welcome to proceed to the emergency room for sooner treatment evaluation, otherwise we will plan on getting her scheduled for angiogram as soon as possible.  Recommend:  The patient has evidence of severe atherosclerotic changes of both lower extremities with rest pain that is associated with preulcerative changes and impending tissue loss of the left foot.  This represents a limb threatening ischemia and places the patient at the risk for left limb loss.  Patient should undergo angiography of the left lower extremity with the hope for intervention for limb salvage.  The risks and benefits as well as the alternative therapies was discussed in detail with the patient.  All questions were answered.  Patient agrees to proceed with left lower extremity angiography.  The patient will follow up with me in the office after the procedure.      2. Tobacco use  disorder Smoking cessation was discussed, 3-10 minutes spent on this topic specifically  3. Diabetes mellitus due to underlying condition with hyperglycemia, unspecified whether long term insulin use (HCC) Continue hypoglycemic medications as already ordered, these medications have been reviewed and there are no changes at this time.  Hgb A1C to be monitored as already arranged by primary service  4. Hyperlipidemia, unspecified hyperlipidemia type Continue statin as ordered and reviewed, no changes at this time   Current Outpatient Medications on File Prior to Visit  Medication Sig Dispense Refill  . acetaminophen (TYLENOL) 325 MG tablet Take 2 tablets (650 mg total) by mouth every 6 (six) hours as needed for mild pain (or Fever >/= 101).    . ADVAIR DISKUS 250-50 MCG/ACT AEPB Inhale 1 puff into the lungs in the morning and at bedtime.    Marland Kitchen albuterol (PROVENTIL HFA;VENTOLIN HFA) 108 (90 Base) MCG/ACT inhaler Inhale 2 puffs into the lungs every 4 (four) hours as needed for wheezing or shortness of breath.    Marland Kitchen amitriptyline (ELAVIL) 100 MG tablet Take 10 mg by mouth at bedtime as needed for sleep.    Marland Kitchen aspirin EC 81 MG tablet Take 1 tablet (81 mg total) by mouth daily. Swallow whole. 30 tablet 0  . Continuous Blood Gluc Sensor (FREESTYLE LIBRE 2 SENSOR) MISC USE AS DIRECTED EVERY 2 WEEKS    . esomeprazole (NEXIUM) 20 MG capsule Take 20 mg by mouth daily at 12 noon.    Marland Kitchen FLUoxetine (PROZAC) 40 MG capsule  Take 1 capsule by mouth daily.    Marland Kitchen glipiZIDE (GLUCOTROL) 10 MG tablet Take 10 mg by mouth daily as needed.    . insulin NPH-regular Human (70-30) 100 UNIT/ML injection Inject 25-70 Units into the skin in the morning and at bedtime. 70 qam 25 qhs    . JARDIANCE 25 MG TABS tablet Take 25 mg by mouth daily.    Marland Kitchen lisinopril-hydrochlorothiazide (ZESTORETIC) 20-25 MG tablet Take 1 tablet by mouth daily.    Marland Kitchen OZEMPIC, 0.25 OR 0.5 MG/DOSE, 2 MG/1.5ML SOPN Inject 1.5 mLs into the skin daily.    .  pantoprazole (PROTONIX) 40 MG tablet Take 40 mg by mouth daily.    Marland Kitchen tiZANidine (ZANAFLEX) 4 MG tablet Take 2-4 mg by mouth every 12 (twelve) hours as needed for muscle spasms.    Marland Kitchen triamcinolone cream (KENALOG) 0.1 % Apply topically.    Marland Kitchen atorvastatin (LIPITOR) 10 MG tablet Take 1 tablet (10 mg total) by mouth daily. 30 tablet 0   No current facility-administered medications on file prior to visit.    There are no Patient Instructions on file for this visit. No follow-ups on file.   Georgiana Spinner, NP

## 2023-06-22 ENCOUNTER — Telehealth (INDEPENDENT_AMBULATORY_CARE_PROVIDER_SITE_OTHER): Payer: Self-pay

## 2023-06-22 NOTE — Telephone Encounter (Signed)
Spoke with the patient and she is scheduled with Dr. Wyn Quaker on 06/27/23 with a 10:00 am arrival time to the [facility] for a LLE angio. Pre-procedure instructions were discussed and because the patient does not have Mychart and I will be mailing this information. I did ask the patient to write the information down and she stated she knew it.

## 2023-06-26 NOTE — Progress Notes (Unsigned)
Patient: Kristie Olson  Service Category: E/M  Provider: Oswaldo Done, MD  DOB: 04-Feb-1956  DOS: 06/27/2023  Referring Provider: Elijah Birk, MD  MRN: [id number]  Setting: Ambulatory outpatient  PCP: [facility]  Type: New Patient  Specialty: Interventional Pain Management    Location: Office  Delivery: Face-to-face     Primary Reason(s) for Visit: Encounter for initial evaluation of one or more chronic problems (new to examiner) potentially causing chronic pain, and posing a threat to normal musculoskeletal function. (Level of risk: High) CC: No chief complaint on file.  HPI  Kristie Olson is a 67 y.o. year old, female patient, who comes for the first time to our practice referred by Kristie Jungling I, MD for our initial evaluation of her chronic pain. She has Slurred speech; Breast abscess of female; Bronchitis; Acute pancreatitis; Hypertension; Diabetes mellitus with hyperglycemia (HCC); Dehydration; Depression; Asthma; Benign essential hypertension; Cervical radiculopathy; Cervical spondylosis without myelopathy; Dizziness; Hematuria; Hyperlipidemia; Long term current use of insulin (HCC); Peripheral neuropathy; Proteinuria; Right bundle branch block; Skin macule or macular rash; Transient ischemic attack; Type 2 diabetes mellitus with diabetic neuropathy (HCC); Type 2 diabetes mellitus with hyperglycemia (HCC); Vulvar itching; Pancreatitis; Anxiety and depression; Type II or unspecified type diabetes mellitus with renal manifestations, not stated as uncontrolled; Type 2 diabetes mellitus (HCC); Disorder of bursae of shoulder region; PAD (peripheral artery disease) (HCC); Claudication of both lower extremities (HCC); and Critical limb ischemia of both lower extremities (HCC) on their problem list. Today she comes in for evaluation of her No chief complaint on file.  Pain Assessment: Location:     Radiating:   Onset:   Duration:   Quality:   Severity:  /10  (subjective, self-reported pain score)  Effect on ADL:   Timing:   Modifying factors:   BP:    HR:    Onset and Duration: {Hx; Onset and Duration:210120511} Cause of pain: {Hx; Cause:210120521} Severity: {Pain Severity:210120502} Timing: {Symptoms; Timing:210120501} Aggravating Factors: {Causes; Aggravating pain factors:210120507} Alleviating Factors: {Causes; Alleviating Factors:210120500} Associated Problems: {Hx; Associated problems:210120515} Quality of Pain: {Hx; Symptom quality or Descriptor:210120531} Previous Examinations or Tests: {Hx; Previous examinations or test:210120529} Previous Treatments: {Hx; Previous Treatment:210120503}  Kristie Olson is being evaluated for possible interventional pain management therapies for the treatment of her chronic pain.   ***  Kristie Olson has been informed that this initial visit was an evaluation only.  On the follow up appointment Olson will go over the results, including ordered tests and available interventional therapies. At that time she will have the opportunity to decide whether to proceed with offered therapies or not. In the event that Kristie Olson prefers avoiding interventional options, this will conclude our involvement in the case.  Medication management recommendations may be provided upon request.  Historic Controlled Substance Pharmacotherapy Review  PMP and historical list of controlled substances: ***  Most recently prescribed opioid analgesics:   *** MME/day: *** mg/day  Historical Monitoring: The patient  reports that she does not currently use drugs. List of prior UDS Testing: No results found for: "MDMA", "COCAINSCRNUR", "PCPSCRNUR", "PCPQUANT", "CANNABQUANT", "THCU", "ETH", "CBDTHCR", "D8THCCBX", "D9THCCBX" Historical Background Evaluation: [place] PMP: PDMP reviewed during this encounter. Review of the past 28-months conducted.             PMP NARX Score Report:  Narcotic: *** Sedative: *** Stimulant: *** [place] Department of  public safety, offender search: Engineer, mining Information) Non-contributory Risk Assessment Profile: Aberrant behavior: None observed or detected today Risk factors  for fatal opioid overdose: None identified today PMP NARX Overdose Risk Score: *** Fatal overdose hazard ratio (HR): Calculation deferred Non-fatal overdose hazard ratio (HR): Calculation deferred Risk of opioid abuse or dependence: 0.7-3.0% with doses ? 36 MME/day and 6.1-26% with doses ? 120 MME/day. Substance use disorder (SUD) risk level: See below Personal History of Substance Abuse (SUD-Substance use disorder):  Alcohol:    Illegal Drugs:    Rx Drugs:    ORT Risk Level calculation:    ORT Scoring interpretation table:  Score <3 = Low Risk for SUD  Score between 4-7 = Moderate Risk for SUD  Score >8 = High Risk for Opioid Abuse   PHQ-2 Depression Scale:  Total score:    PHQ-2 Scoring interpretation table: (Score and probability of major depressive disorder)  Score 0 = No depression  Score 1 = 15.4% Probability  Score 2 = 21.1% Probability  Score 3 = 38.4% Probability  Score 4 = 45.5% Probability  Score 5 = 56.4% Probability  Score 6 = 78.6% Probability   PHQ-9 Depression Scale:  Total score:    PHQ-9 Scoring interpretation table:  Score 0-4 = No depression  Score 5-9 = Mild depression  Score 10-14 = Moderate depression  Score 15-19 = Moderately severe depression  Score 20-27 = Severe depression (2.4 times higher risk of SUD and 2.89 times higher risk of overuse)   Pharmacologic Plan: As per protocol, Olson have not taken over any controlled substance management, pending the results of ordered tests and/or consults.            Initial impression: Pending review of available data and ordered tests.  Meds   Current Outpatient Medications:    acetaminophen (TYLENOL) 325 MG tablet, Take 2 tablets (650 mg total) by mouth every 6 (six) hours as needed for mild pain (or Fever >/= 101)., Disp: , Rfl:    ADVAIR DISKUS  250-50 MCG/ACT AEPB, Inhale 1 puff into the lungs in the morning and at bedtime., Disp: , Rfl:    albuterol (PROVENTIL HFA;VENTOLIN HFA) 108 (90 Base) MCG/ACT inhaler, Inhale 2 puffs into the lungs every 4 (four) hours as needed for wheezing or shortness of breath., Disp: , Rfl:    amitriptyline (ELAVIL) 100 MG tablet, Take 10 mg by mouth at bedtime as needed for sleep., Disp: , Rfl:    aspirin EC 81 MG tablet, Take 1 tablet (81 mg total) by mouth daily. Swallow whole., Disp: 30 tablet, Rfl: 0   atorvastatin (LIPITOR) 10 MG tablet, Take 1 tablet (10 mg total) by mouth daily., Disp: 30 tablet, Rfl: 0   clopidogrel (PLAVIX) 75 MG tablet, Take 1 tablet (75 mg total) by mouth daily., Disp: 30 tablet, Rfl: 0   clopidogrel (PLAVIX) 75 MG tablet, Take 1 tablet (75 mg total) by mouth daily., Disp: 90 tablet, Rfl: 3   Continuous Blood Gluc Sensor (FREESTYLE LIBRE 2 SENSOR) MISC, USE AS DIRECTED EVERY 2 WEEKS, Disp: , Rfl:    esomeprazole (NEXIUM) 20 MG capsule, Take 20 mg by mouth daily at 12 noon., Disp: , Rfl:    FLUoxetine (PROZAC) 40 MG capsule, Take 1 capsule by mouth daily., Disp: , Rfl:    glipiZIDE (GLUCOTROL) 10 MG tablet, Take 10 mg by mouth daily as needed., Disp: , Rfl:    insulin NPH-regular Human (70-30) 100 UNIT/ML injection, Inject 25-70 Units into the skin in the morning and at bedtime. 70 qam 25 qhs, Disp: , Rfl:    JARDIANCE 25 MG TABS tablet,  Take 25 mg by mouth daily., Disp: , Rfl:    lisinopril-hydrochlorothiazide (ZESTORETIC) 20-25 MG tablet, Take 1 tablet by mouth daily., Disp: , Rfl:    oxyCODONE (OXY IR/ROXICODONE) 5 MG immediate release tablet, Take 1 tablet (5 mg total) by mouth every 8 (eight) hours as needed for severe pain., Disp: 20 tablet, Rfl: 0   OZEMPIC, 0.25 OR 0.5 MG/DOSE, 2 MG/1.5ML SOPN, Inject 1.5 mLs into the skin daily., Disp: , Rfl:    pantoprazole (PROTONIX) 40 MG tablet, Take 40 mg by mouth daily., Disp: , Rfl:    tiZANidine (ZANAFLEX) 4 MG tablet, Take 2-4 mg by  mouth every 12 (twelve) hours as needed for muscle spasms., Disp: , Rfl:    triamcinolone cream (KENALOG) 0.1 %, Apply topically., Disp: , Rfl:   Imaging Review  Cervical Imaging: Cervical MR wo contrast: No results found for this or any previous visit.  Cervical MR wo contrast: No valid procedures specified. Cervical MR w/wo contrast: No results found for this or any previous visit.  Cervical MR w contrast: No results found for this or any previous visit.  Cervical CT wo contrast: No results found for this or any previous visit.  Cervical CT w/wo contrast: No results found for this or any previous visit.  Cervical CT w/wo contrast: No results found for this or any previous visit.  Cervical CT w contrast: No results found for this or any previous visit.  Cervical CT outside: No results found for this or any previous visit.  Cervical DG 1 view: No results found for this or any previous visit.  Cervical DG 2-3 views: No results found for this or any previous visit.  Cervical DG F/E views: No results found for this or any previous visit.  Cervical DG 2-3 clearing views: No results found for this or any previous visit.  Cervical DG Bending/F/E views: No results found for this or any previous visit.  Cervical DG complete: No results found for this or any previous visit.  Cervical DG Myelogram views: No results found for this or any previous visit.  Cervical DG Myelogram views: No results found for this or any previous visit.  Cervical Discogram views: No results found for this or any previous visit.   Shoulder Imaging: Shoulder-R MR w contrast: No results found for this or any previous visit.  Shoulder-L MR w contrast: No results found for this or any previous visit.  Shoulder-R MR w/wo contrast: No results found for this or any previous visit.  Shoulder-L MR w/wo contrast: No results found for this or any previous visit.  Shoulder-R MR wo contrast: No results found for  this or any previous visit.  Shoulder-L MR wo contrast: No results found for this or any previous visit.  Shoulder-R CT w contrast: No results found for this or any previous visit.  Shoulder-L CT w contrast: No results found for this or any previous visit.  Shoulder-R CT w/wo contrast: No results found for this or any previous visit.  Shoulder-L CT w/wo contrast: No results found for this or any previous visit.  Shoulder-R CT wo contrast: No results found for this or any previous visit.  Shoulder-L CT wo contrast: No results found for this or any previous visit.  Shoulder-R DG Arthrogram: No results found for this or any previous visit.  Shoulder-L DG Arthrogram: No results found for this or any previous visit.  Shoulder-R DG 1 view: No results found for this or any previous visit.  Shoulder-L DG  1 view: No results found for this or any previous visit.  Shoulder-R DG: No results found for this or any previous visit.  Shoulder-L DG: No results found for this or any previous visit.   Thoracic Imaging: Thoracic MR wo contrast: No results found for this or any previous visit.  Thoracic MR wo contrast: No valid procedures specified. Thoracic MR w/wo contrast: No results found for this or any previous visit.  Thoracic MR w contrast: No results found for this or any previous visit.  Thoracic CT wo contrast: No results found for this or any previous visit.  Thoracic CT w/wo contrast: No results found for this or any previous visit.  Thoracic CT w/wo contrast: No results found for this or any previous visit.  Thoracic CT w contrast: No results found for this or any previous visit.  Thoracic DG 2-3 views: No results found for this or any previous visit.  Thoracic DG 4 views: No results found for this or any previous visit.  Thoracic DG: No results found for this or any previous visit.  Thoracic DG w/swimmers view: No results found for this or any previous visit.  Thoracic  DG Myelogram views: No results found for this or any previous visit.  Thoracic DG Myelogram views: No results found for this or any previous visit.   Lumbosacral Imaging: Lumbar MR wo contrast: Results for orders placed during the hospital encounter of 11/18/22  MR LUMBAR SPINE WO CONTRAST  Narrative CLINICAL DATA:  Low back pain extending into both legs for 1-2 years.  EXAM: MRI LUMBAR SPINE WITHOUT CONTRAST  TECHNIQUE: Multiplanar, multisequence MR imaging of the lumbar spine was performed. No intravenous contrast was administered.  COMPARISON:  None Available.  FINDINGS: Segmentation: Transitional anatomy with sacralization of the L5 vertebral body.  Alignment:  Physiologic.  Vertebrae: No acute fracture, evidence of discitis, or aggressive bone lesion.  Conus medullaris and cauda equina: Conus extends to the L1 level. Conus and cauda equina appear normal.  Paraspinal and other soft tissues: No acute paraspinal abnormality.  Disc levels:  Disc spaces: Disc desiccation throughout the lumbar spine. No significant disc height loss.  T12-L1: No significant disc bulge. No neural foraminal stenosis. No central canal stenosis. Mild bilateral facet arthropathy.  L1-L2: No significant disc bulge. No neural foraminal stenosis. No central canal stenosis. Mild bilateral facet arthropathy.  L2-L3: No significant disc bulge. No neural foraminal stenosis. No central canal stenosis. Mild-moderate bilateral facet arthropathy.  L3-L4: Mild broad-based disc bulge. Moderate bilateral facet arthropathy, right worse than left. Mild spinal stenosis. Mild right foraminal stenosis. No left foraminal stenosis.  L4-L5: Mild broad-based disc bulge. Moderate left and mild right facet arthropathy. No foraminal or central canal stenosis.  L5-S1: No significant disc bulge. No neural foraminal stenosis. No central canal stenosis.  IMPRESSION: 1. Mild lumbar spine spondylosis as  described above. 2. No acute osseous injury of the lumbar spine.   Electronically Signed By: Elige Ko M.D. On: 11/20/2022 08:46  Lumbar MR wo contrast: No valid procedures specified. Lumbar MR w/wo contrast: No results found for this or any previous visit.  Lumbar MR w/wo contrast: No results found for this or any previous visit.  Lumbar MR w contrast: No results found for this or any previous visit.  Lumbar CT wo contrast: No results found for this or any previous visit.  Lumbar CT w/wo contrast: No results found for this or any previous visit.  Lumbar CT w/wo contrast: No results found for this  or any previous visit.  Lumbar CT w contrast: No results found for this or any previous visit.  Lumbar DG 1V: No results found for this or any previous visit.  Lumbar DG 1V (Clearing): No results found for this or any previous visit.  Lumbar DG 2-3V (Clearing): No results found for this or any previous visit.  Lumbar DG 2-3 views: No results found for this or any previous visit.  Lumbar DG (Complete) 4+V: No results found for this or any previous visit.        Lumbar DG F/E views: No results found for this or any previous visit.        Lumbar DG Bending views: No results found for this or any previous visit.        Lumbar DG Myelogram views: No results found for this or any previous visit.  Lumbar DG Myelogram: No results found for this or any previous visit.  Lumbar DG Myelogram: No results found for this or any previous visit.  Lumbar DG Myelogram: No results found for this or any previous visit.  Lumbar DG Myelogram Lumbosacral: No results found for this or any previous visit.  Lumbar DG Diskogram views: No results found for this or any previous visit.  Lumbar DG Diskogram views: No results found for this or any previous visit.  Lumbar DG Epidurogram OP: No results found for this or any previous visit.  Lumbar DG Epidurogram IP: No valid procedures  specified.  Sacroiliac Joint Imaging: Sacroiliac Joint DG: No results found for this or any previous visit.  Sacroiliac Joint MR w/wo contrast: No results found for this or any previous visit.  Sacroiliac Joint MR wo contrast: No results found for this or any previous visit.   Spine Imaging: Whole Spine DG Myelogram views: No results found for this or any previous visit.  Whole Spine MR Mets screen: No results found for this or any previous visit.  Whole Spine MR Mets screen: No results found for this or any previous visit.  Whole Spine MR w/wo: No results found for this or any previous visit.  MRA Spinal Canal w/ cm: No results found for this or any previous visit.  MRA Spinal Canal wo/ cm: No valid procedures specified. MRA Spinal Canal w/wo cm: No results found for this or any previous visit.  Spine Outside MR Films: No results found for this or any previous visit.  Spine Outside CT Films: No results found for this or any previous visit.  CT-Guided Biopsy: No results found for this or any previous visit.  CT-Guided Needle Placement: No results found for this or any previous visit.  DG Spine outside: No results found for this or any previous visit.  IR Spine outside: No results found for this or any previous visit.  NM Spine outside: No results found for this or any previous visit.   Hip Imaging: Hip-R MR w contrast: No results found for this or any previous visit.  Hip-L MR w contrast: No results found for this or any previous visit.  Hip-R MR w/wo contrast: No results found for this or any previous visit.  Hip-L MR w/wo contrast: No results found for this or any previous visit.  Hip-R MR wo contrast: No results found for this or any previous visit.  Hip-L MR wo contrast: No results found for this or any previous visit.  Hip-R CT w contrast: No results found for this or any previous visit.  Hip-L CT w contrast: No results found  for this or any previous  visit.  Hip-R CT w/wo contrast: No results found for this or any previous visit.  Hip-L CT w/wo contrast: No results found for this or any previous visit.  Hip-R CT wo contrast: No results found for this or any previous visit.  Hip-L CT wo contrast: No results found for this or any previous visit.  Hip-R DG 2-3 views: No results found for this or any previous visit.  Hip-L DG 2-3 views: No results found for this or any previous visit.  Hip-R DG Arthrogram: No results found for this or any previous visit.  Hip-L DG Arthrogram: No results found for this or any previous visit.  Hip-B DG Bilateral: No results found for this or any previous visit.  Hip-B DG Bilateral (5V): No results found for this or any previous visit.   Knee Imaging: Knee-R MR w contrast: No results found for this or any previous visit.  Knee-L MR w/o contrast: No results found for this or any previous visit.  Knee-R MR w/wo contrast: No results found for this or any previous visit.  Knee-L MR w/wo contrast: No results found for this or any previous visit.  Knee-R MR wo contrast: No results found for this or any previous visit.  Knee-L MR wo contrast: No results found for this or any previous visit.  Knee-R CT w contrast: No results found for this or any previous visit.  Knee-L CT w contrast: No results found for this or any previous visit.  Knee-R CT w/wo contrast: No results found for this or any previous visit.  Knee-L CT w/wo contrast: No results found for this or any previous visit.  Knee-R CT wo contrast: No results found for this or any previous visit.  Knee-L CT wo contrast: No results found for this or any previous visit.  Knee-R DG 1-2 views: No results found for this or any previous visit.  Knee-L DG 1-2 views: No results found for this or any previous visit.  Knee-R DG 3 views: No results found for this or any previous visit.  Knee-L DG 3 views: No results found for this or any previous  visit.  Knee-R DG 4 views: No results found for this or any previous visit.  Knee-L DG 4 views: No results found for this or any previous visit.  Knee-R DG Arthrogram: No results found for this or any previous visit.  Knee-L DG Arthrogram: No results found for this or any previous visit.   Ankle Imaging: Ankle-R DG Complete: No results found for this or any previous visit.  Ankle-L DG Complete: No results found for this or any previous visit.   Foot Imaging: Foot-R DG Complete: No results found for this or any previous visit.  Foot-L DG Complete: No results found for this or any previous visit.   Elbow Imaging: Elbow-R DG Complete: No results found for this or any previous visit.  Elbow-L DG Complete: No results found for this or any previous visit.   Wrist Imaging: Wrist-R DG Complete: No results found for this or any previous visit.  Wrist-L DG Complete: No results found for this or any previous visit.   Hand Imaging: Hand-R DG Complete: No results found for this or any previous visit.  Hand-L DG Complete: No results found for this or any previous visit.   Complexity Note: Imaging results reviewed.  ROS  Cardiovascular: {Hx; Cardiovascular History:210120525} Pulmonary or Respiratory: {Hx; Pumonary and/or Respiratory History:210120523} Neurological: {Hx; Neurological:210120504} Psychological-Psychiatric: {Hx; Psychological-Psychiatric History:210120512} Gastrointestinal: {Hx; Gastrointestinal:210120527} Genitourinary: {Hx; Genitourinary:210120506} Hematological: {Hx; Hematological:210120510} Endocrine: {Hx; Endocrine history:210120509} Rheumatologic: {Hx; Rheumatological:210120530} Musculoskeletal: {Hx; Musculoskeletal:210120528} Work History: {Hx; Work history:210120514}  Allergies  Ms. Rocco is allergic to ibuprofen, nsaids, and percocet [oxycodone-acetaminophen].  Laboratory Chemistry Profile   Renal Lab Results  Component  Value Date   BUN 11 05/23/2023   CREATININE 0.75 05/23/2023   GFRAA >60 07/01/2020   GFRNONAA >60 05/23/2023   PROTEINUR 100 (A) 02/14/2023     Electrolytes Lab Results  Component Value Date   NA 136 05/23/2023   K 3.7 05/23/2023   CL 102 05/23/2023   CALCIUM 9.4 05/23/2023     Hepatic Lab Results  Component Value Date   AST 17 04/28/2020   ALT 14 04/28/2020   ALBUMIN 4.3 04/28/2020   ALKPHOS 89 04/28/2020   LIPASE 69 (H) 04/28/2020     ID Lab Results  Component Value Date   HIV Non Reactive 09/03/2022   SARSCOV2NAA NEGATIVE 04/28/2020     Bone No results found for: "VD25OH", "VD125OH2TOT", "ZO1096EA5", "WU9811BJ4", "25OHVITD1", "25OHVITD2", "25OHVITD3", "TESTOFREE", "TESTOSTERONE"   Endocrine Lab Results  Component Value Date   GLUCOSE 186 (H) 05/23/2023   GLUCOSEU NEGATIVE 02/14/2023   HGBA1C 8.2 (H) 09/02/2022     Neuropathy Lab Results  Component Value Date   HGBA1C 8.2 (H) 09/02/2022   HIV Non Reactive 09/03/2022     CNS No results found for: "COLORCSF", "APPEARCSF", "RBCCOUNTCSF", "WBCCSF", "POLYSCSF", "LYMPHSCSF", "EOSCSF", "PROTEINCSF", "GLUCCSF", "JCVIRUS", "CSFOLI", "IGGCSF", "LABACHR", "ACETBL"   Inflammation (CRP: Acute  ESR: Chronic) Lab Results  Component Value Date   CRP 0.5 05/23/2023   ESRSEDRATE 25 05/23/2023     Rheumatology No results found for: "RF", "ANA", "LABURIC", "URICUR", "LYMEIGGIGMAB", "LYMEABIGMQN", "HLAB27"   Coagulation Lab Results  Component Value Date   INR 1.0 09/02/2022   LABPROT 13.0 09/02/2022   APTT 36 09/02/2022   PLT 419 (H) 05/23/2023     Cardiovascular Lab Results  Component Value Date   TROPONINI <0.03 11/26/2018   HGB 14.2 05/23/2023   HCT 43.0 05/23/2023     Screening Lab Results  Component Value Date   SARSCOV2NAA NEGATIVE 04/28/2020   HIV Non Reactive 09/03/2022     Cancer No results found for: "CEA", "CA125", "LABCA2"   Allergens No results found for: "ALMOND", "APPLE", "ASPARAGUS",  "AVOCADO", "BANANA", "BARLEY", "BASIL", "BAYLEAF", "GREENBEAN", "LIMABEAN", "WHITEBEAN", "BEEFIGE", "REDBEET", "BLUEBERRY", "BROCCOLI", "CABBAGE", "MELON", "CARROT", "CASEIN", "CASHEWNUT", "CAULIFLOWER", "CELERY"     Note: Lab results reviewed.  PFSH  Drug: Ms. Dennin  reports that she does not currently use drugs. Alcohol:  reports no history of alcohol use. Tobacco:  reports that she has been smoking cigarettes. She has a 46 pack-year smoking history. She has never used smokeless tobacco. Medical:  has a past medical history of Asthma, Coronary artery disease, Diabetes mellitus without complication (HCC) (1990), Fibromyalgia, Hypertension, and PAD (peripheral artery disease) (HCC) (09/03/2022). Family: family history includes Breast cancer in her paternal aunt; Breast cancer (age of onset: 40) in her sister.  Past Surgical History:  Procedure Laterality Date   BREAST EXCISIONAL BIOPSY Left 1980's   neg   BREAST MASS EXCISION Left    age 89"s, benign   CESAREAN SECTION     CHOLECYSTECTOMY  2009   LOWER EXTREMITY ANGIOGRAPHY Left 09/03/2022   Procedure: Lower Extremity Angiography;  Surgeon: Annice Needy, MD;  Location: [facility]  INVASIVE CV LAB;  Service: Cardiovascular;  Laterality: Left;   LOWER EXTREMITY ANGIOGRAPHY Right 09/08/2022   Procedure: Lower Extremity Angiography;  Surgeon: Annice Needy, MD;  Location: [facility] INVASIVE CV LAB;  Service: Cardiovascular;  Laterality: Right;   LOWER EXTREMITY ANGIOGRAPHY Left 01/18/2023   Procedure: Lower Extremity Angiography;  Surgeon: Renford Dills, MD;  Location: [facility] INVASIVE CV LAB;  Service: Cardiovascular;  Laterality: Left;   LOWER EXTREMITY ANGIOGRAPHY Left 02/14/2023   Procedure: Lower Extremity Angiography;  Surgeon: Annice Needy, MD;  Location: [facility] INVASIVE CV LAB;  Service: Cardiovascular;  Laterality: Left;   Active Ambulatory Problems    Diagnosis Date Noted   Slurred speech 07/27/2017   Breast abscess of female 03/29/2018    Bronchitis 11/26/2018   Acute pancreatitis 04/28/2020   Hypertension 10/25/2007   Diabetes mellitus with hyperglycemia (HCC)    Dehydration    Depression    Asthma 09/16/2020   Benign essential hypertension 07/31/2020   Cervical radiculopathy 08/14/2018   Cervical spondylosis without myelopathy 01/16/2021   Dizziness 02/22/2017   Hematuria 07/31/2020   Hyperlipidemia 07/19/2008   Long term current use of insulin (HCC) 11/01/2013   Peripheral neuropathy 09/16/2020   Proteinuria 07/31/2020   Right bundle branch block 09/18/2020   Skin macule or macular rash 09/18/2020   Transient ischemic attack 08/05/2017   Type 2 diabetes mellitus with diabetic neuropathy (HCC) 04/18/2013   Type 2 diabetes mellitus with hyperglycemia (HCC) 06/20/2018   Vulvar itching 09/18/2020   Pancreatitis 09/16/2020   Anxiety and depression 09/16/2020   Type II or unspecified type diabetes mellitus with renal manifestations, not stated as uncontrolled 07/31/2020   Type 2 diabetes mellitus (HCC) 09/16/2020   Disorder of bursae of shoulder region 01/16/2021   PAD (peripheral artery disease) (HCC) 09/03/2022   Claudication of both lower extremities (HCC) 09/03/2022   Critical limb ischemia of both lower extremities (HCC)    Resolved Ambulatory Problems    Diagnosis Date Noted   No Resolved Ambulatory Problems   Past Medical History:  Diagnosis Date   Coronary artery disease    Diabetes mellitus without complication (HCC) 1990   Fibromyalgia    Constitutional Exam  General appearance: Well nourished, well developed, and well hydrated. In no apparent acute distress There were no vitals filed for this visit. BMI Assessment: Estimated body mass index is 42.58 kg/m as calculated from the following:   Height as of 06/21/23: 5' (1.524 m).   Weight as of 06/21/23: 218 lb (98.9 kg).  BMI interpretation table: BMI level Category Range association with higher incidence of chronic pain  <18 kg/m2 Underweight    18.5-24.9 kg/m2 Ideal body weight   25-29.9 kg/m2 Overweight Increased incidence by 20%  30-34.9 kg/m2 Obese (Class [facility]) Increased incidence by 68%  35-39.9 kg/m2 Severe obesity (Class II) Increased incidence by 136%  >40 kg/m2 Extreme obesity (Class III) Increased incidence by 254%   Patient's current BMI Ideal Body weight  There is no height or weight on file to calculate BMI. Ideal body weight: 45.5 kg (100 lb 4.9 oz) Adjusted ideal body weight: 66.9 kg (147 lb 6.2 oz)   BMI Readings from Last 4 Encounters:  06/21/23 42.58 kg/m  06/03/23 42.58 kg/m  05/23/23 40.43 kg/m  03/21/23 39.84 kg/m   Wt Readings from Last 4 Encounters:  06/21/23 218 lb (98.9 kg)  06/03/23 218 lb 0.2 oz (98.9 kg)  05/23/23 207 lb (93.9 kg)  03/21/23 204 lb (92.5 kg)    Psych/Mental status:  Alert, oriented x 3 (person, place, & time)       Eyes: PERLA Respiratory: No evidence of acute respiratory distress  Assessment  Primary Diagnosis & Pertinent Problem List: {There were no encounter diagnoses. (Refresh or delete this SmartLink)}  Visit Diagnosis (New problems to examiner): No diagnosis found. Plan of Care (Initial workup plan)  Note: Ms. Mockus was reminded that as per protocol, today's visit has been an evaluation only. We have not taken over the patient's controlled substance management.  Problem-specific plan: No problem-specific Assessment & Plan notes found for this encounter.  Lab Orders  No laboratory test(s) ordered today   Imaging Orders  No imaging studies ordered today   Referral Orders  No referral(s) requested today   Procedure Orders    No procedure(s) ordered today   Pharmacotherapy (current): Medications ordered:  No orders of the defined types were placed in this encounter.  Medications administered during this visit: Anieyah Arizpe. Carrozza had no medications administered during this visit.   Analgesic Pharmacotherapy:  Opioid Analgesics: For patients currently  taking or requesting to take opioid analgesics, in accordance with [facility] Guidelines, we will assess their risks and indications for the use of these substances. After completing our evaluation, we may offer recommendations, but we no longer take patients for medication management. The prescribing physician will ultimately decide, based on his/her training and level of comfort whether to adopt any of the recommendations, including whether or not to prescribe such medicines.  Membrane stabilizer: To be determined at a later time  Muscle relaxant: To be determined at a later time  NSAID: To be determined at a later time  Other analgesic(s): To be determined at a later time   Interventional management options: Ms. Mancias was informed that there is no guarantee that she would be a candidate for interventional therapies. The decision will be based on the results of diagnostic studies, as well as Ms. Rasmussen's risk profile.  Procedure(s) under consideration:  Pending results of ordered studies      Interventional Therapies  Risk Factors  Considerations  Medical Comorbidities:     Planned  Pending:      Under consideration:   Pending   Completed:   None at this time   Therapeutic  Palliative (PRN) options:   None established   Completed by other providers:   None reported       Provider-requested follow-up: No follow-ups on file.  Future Appointments  Date Time Provider Department Center  06/27/2023  2:00 PM Delano Metz, MD [facility] None    Duration of encounter: *** minutes.  Total time on encounter, as per AMA guidelines included both the face-to-face and non-face-to-face time personally spent by the physician and/or other qualified health care professional(s) on the day of the encounter (includes time in activities that require the physician or other qualified health care professional and does not include time in activities normally performed by  clinical staff). Physician's time may include the following activities when performed: Preparing to see the patient (e.g., pre-charting review of records, searching for previously ordered imaging, lab work, and nerve conduction tests) Review of prior analgesic pharmacotherapies. Reviewing PMP Interpreting ordered tests (e.g., lab work, imaging, nerve conduction tests) Performing post-procedure evaluations, including interpretation of diagnostic procedures Obtaining and/or reviewing separately obtained history Performing a medically appropriate examination and/or evaluation Counseling and educating the patient/family/caregiver Ordering medications, tests, or procedures Referring and communicating with other health care professionals (when not separately reported) Documenting clinical information  in the electronic or other health record Independently interpreting results (not separately reported) and communicating results to the patient/ family/caregiver Care coordination (not separately reported)  Note by: Oswaldo Done, MD (TTS technology used. [facility] apologize for any typographical errors that were not detected and corrected.) Date: 06/27/2023; Time: 9:01 AM

## 2023-06-27 ENCOUNTER — Encounter: Payer: Self-pay | Admitting: Vascular Surgery

## 2023-06-27 ENCOUNTER — Encounter
Admission: RE | Disposition: A | Discharge status: Home / Self Care | Payer: Self-pay | Source: Home / Self Care | Attending: Vascular Surgery

## 2023-06-27 ENCOUNTER — Ambulatory Visit (HOSPITAL_BASED_OUTPATIENT_CLINIC_OR_DEPARTMENT_OTHER): Payer: 59 | Admitting: Pain Medicine

## 2023-06-27 ENCOUNTER — Other Ambulatory Visit: Payer: Self-pay

## 2023-06-27 ENCOUNTER — Ambulatory Visit
Admission: RE | Admit: 2023-06-27 | Discharge: 2023-06-27 | Disposition: A | Discharge status: Home / Self Care | Payer: 59 | Source: Home / Self Care | Attending: Vascular Surgery | Admitting: Vascular Surgery

## 2023-06-27 DIAGNOSIS — E1151 Type 2 diabetes mellitus with diabetic peripheral angiopathy without gangrene: Secondary | ICD-10-CM | POA: Insufficient documentation

## 2023-06-27 DIAGNOSIS — E785 Hyperlipidemia, unspecified: Secondary | ICD-10-CM | POA: Diagnosis not present

## 2023-06-27 DIAGNOSIS — Z91199 Patient's noncompliance with other medical treatment and regimen due to unspecified reason: Secondary | ICD-10-CM

## 2023-06-27 DIAGNOSIS — Z7985 Long-term (current) use of injectable non-insulin antidiabetic drugs: Secondary | ICD-10-CM | POA: Insufficient documentation

## 2023-06-27 DIAGNOSIS — Z7902 Long term (current) use of antithrombotics/antiplatelets: Secondary | ICD-10-CM | POA: Insufficient documentation

## 2023-06-27 DIAGNOSIS — Z9889 Other specified postprocedural states: Secondary | ICD-10-CM | POA: Diagnosis not present

## 2023-06-27 DIAGNOSIS — I743 Embolism and thrombosis of arteries of the lower extremities: Secondary | ICD-10-CM

## 2023-06-27 DIAGNOSIS — Z79899 Other long term (current) drug therapy: Secondary | ICD-10-CM | POA: Insufficient documentation

## 2023-06-27 DIAGNOSIS — Z794 Long term (current) use of insulin: Secondary | ICD-10-CM | POA: Insufficient documentation

## 2023-06-27 DIAGNOSIS — Z789 Other specified health status: Secondary | ICD-10-CM | POA: Insufficient documentation

## 2023-06-27 DIAGNOSIS — G8929 Other chronic pain: Secondary | ICD-10-CM

## 2023-06-27 DIAGNOSIS — Z7984 Long term (current) use of oral hypoglycemic drugs: Secondary | ICD-10-CM | POA: Diagnosis not present

## 2023-06-27 DIAGNOSIS — M899 Disorder of bone, unspecified: Secondary | ICD-10-CM | POA: Insufficient documentation

## 2023-06-27 DIAGNOSIS — T82868A Thrombosis of vascular prosthetic devices, implants and grafts, initial encounter: Secondary | ICD-10-CM | POA: Diagnosis not present

## 2023-06-27 DIAGNOSIS — F17210 Nicotine dependence, cigarettes, uncomplicated: Secondary | ICD-10-CM | POA: Insufficient documentation

## 2023-06-27 DIAGNOSIS — I70222 Atherosclerosis of native arteries of extremities with rest pain, left leg: Secondary | ICD-10-CM | POA: Insufficient documentation

## 2023-06-27 DIAGNOSIS — E1165 Type 2 diabetes mellitus with hyperglycemia: Secondary | ICD-10-CM | POA: Diagnosis not present

## 2023-06-27 DIAGNOSIS — G894 Chronic pain syndrome: Secondary | ICD-10-CM | POA: Insufficient documentation

## 2023-06-27 DIAGNOSIS — I70229 Atherosclerosis of native arteries of extremities with rest pain, unspecified extremity: Secondary | ICD-10-CM

## 2023-06-27 DIAGNOSIS — R937 Abnormal findings on diagnostic imaging of other parts of musculoskeletal system: Secondary | ICD-10-CM | POA: Insufficient documentation

## 2023-06-27 DIAGNOSIS — T82856A Stenosis of peripheral vascular stent, initial encounter: Secondary | ICD-10-CM | POA: Diagnosis not present

## 2023-06-27 HISTORY — PX: LOWER EXTREMITY ANGIOGRAPHY: CATH118251

## 2023-06-27 LAB — CREATININE, SERUM
Creatinine, Ser: 0.81 mg/dL (ref 0.44–1.00)
GFR, Estimated: >60 mL/min (ref >60)

## 2023-06-27 LAB — BUN: BUN: 16 mg/dL (ref 8–23)

## 2023-06-27 SURGERY — LOWER EXTREMITY ANGIOGRAPHY
Anesthesia: Moderate Sedation | Site: Leg Lower | Laterality: Left

## 2023-06-27 MED ORDER — DIPHENHYDRAMINE HCL 50 MG/ML IJ SOLN
INTRAMUSCULAR | Status: AC
  Filled 2023-06-27: qty 1

## 2023-06-27 MED ORDER — SODIUM CHLORIDE 0.9 % IV SOLN: INTRAVENOUS | Status: DC

## 2023-06-27 MED ORDER — HYDROMORPHONE HCL 1 MG/ML IJ SOLN: 1.0000 mg | Freq: Once | INTRAMUSCULAR | Status: AC | PRN

## 2023-06-27 MED ORDER — SODIUM CHLORIDE 0.9% FLUSH: 3.0000 mL | Freq: Two times a day (BID) | INTRAVENOUS | Status: DC

## 2023-06-27 MED ORDER — FENTANYL CITRATE (PF) 100 MCG/2ML IJ SOLN
INTRAMUSCULAR | Status: DC | PRN
  Administered 2023-06-27 (×2): 50 ug via INTRAVENOUS

## 2023-06-27 MED ORDER — IODIXANOL 320 MG/ML IV SOLN
INTRAVENOUS | Status: DC | PRN
  Administered 2023-06-27: 50 mL via INTRA_ARTERIAL

## 2023-06-27 MED ORDER — HEPARIN (PORCINE) IN NACL 1000-0.9 UT/500ML-% IV SOLN
INTRAVENOUS | Status: DC | PRN
  Administered 2023-06-27: 1000 mL

## 2023-06-27 MED ORDER — CEFAZOLIN SODIUM-DEXTROSE 2-4 GM/100ML-% IV SOLN
INTRAVENOUS | Status: AC
  Filled 2023-06-27: qty 100

## 2023-06-27 MED ORDER — LABETALOL HCL 5 MG/ML IV SOLN: 10.0000 mg | INTRAVENOUS | Status: DC | PRN

## 2023-06-27 MED ORDER — MIDAZOLAM HCL 2 MG/ML PO SYRP: 8.0000 mg | ORAL_SOLUTION | Freq: Once | ORAL | Status: DC | PRN

## 2023-06-27 MED ORDER — METHYLPREDNISOLONE SODIUM SUCC 125 MG IJ SOLR: 125.0000 mg | Freq: Once | INTRAMUSCULAR | Status: DC | PRN

## 2023-06-27 MED ORDER — HEPARIN SODIUM (PORCINE) 1000 UNIT/ML IJ SOLN
INTRAMUSCULAR | Status: AC
  Filled 2023-06-27: qty 10

## 2023-06-27 MED ORDER — DIPHENHYDRAMINE HCL 50 MG/ML IJ SOLN: 50.0000 mg | Freq: Once | INTRAMUSCULAR | Status: DC | PRN

## 2023-06-27 MED ORDER — HYDRALAZINE HCL 20 MG/ML IJ SOLN: 5.0000 mg | INTRAMUSCULAR | Status: DC | PRN

## 2023-06-27 MED ORDER — SODIUM CHLORIDE 0.9% FLUSH: 3.0000 mL | INTRAVENOUS | Status: DC | PRN

## 2023-06-27 MED ORDER — FENTANYL CITRATE (PF) 100 MCG/2ML IJ SOLN
INTRAMUSCULAR | Status: AC
  Filled 2023-06-27: qty 2

## 2023-06-27 MED ORDER — MIDAZOLAM HCL 2 MG/2ML IJ SOLN
INTRAMUSCULAR | Status: AC
  Filled 2023-06-27: qty 2

## 2023-06-27 MED ORDER — HYDROMORPHONE HCL 1 MG/ML IJ SOLN
INTRAMUSCULAR | Status: AC
  Administered 2023-06-27: 0.5 mg via INTRAVENOUS
  Filled 2023-06-27: qty 1

## 2023-06-27 MED ORDER — ONDANSETRON HCL 4 MG/2ML IJ SOLN: 4.0000 mg | Freq: Four times a day (QID) | INTRAMUSCULAR | Status: DC | PRN

## 2023-06-27 MED ORDER — LIDOCAINE-EPINEPHRINE (PF) 1 %-1:200000 IJ SOLN
INTRAMUSCULAR | Status: DC | PRN
  Administered 2023-06-27: 10 mL via INTRADERMAL

## 2023-06-27 MED ORDER — ACETAMINOPHEN 325 MG PO TABS: 650.0000 mg | ORAL_TABLET | ORAL | Status: DC | PRN

## 2023-06-27 MED ORDER — FAMOTIDINE 20 MG PO TABS: 40.0000 mg | ORAL_TABLET | Freq: Once | ORAL | Status: DC | PRN

## 2023-06-27 MED ORDER — CEFAZOLIN SODIUM-DEXTROSE 2-4 GM/100ML-% IV SOLN
2.0000 g | INTRAVENOUS | Status: AC
  Administered 2023-06-27: 2 g via INTRAVENOUS

## 2023-06-27 MED ORDER — SODIUM CHLORIDE 0.9 % IV SOLN: 250.0000 mL | INTRAVENOUS | Status: DC | PRN

## 2023-06-27 MED ORDER — MIDAZOLAM HCL 2 MG/2ML IJ SOLN
INTRAMUSCULAR | Status: DC | PRN
  Administered 2023-06-27: 2 mg via INTRAVENOUS

## 2023-06-27 SURGICAL SUPPLY — 20 items
BALLN LUTONIX 018 5X300X130 (BALLOONS) ×1
BALLN LUTONIX 018 6X40X130 (BALLOONS) ×1
BALLOON LUTONIX 018 5X300X130 (BALLOONS) IMPLANT
BALLOON LUTONIX 018 6X40X130 (BALLOONS) IMPLANT
CATH ANGIO 5F PIGTAIL 65CM (CATHETERS) IMPLANT
CATH ROTAREX 135 6FR (CATHETERS) IMPLANT
CATH VERT 5X100 (CATHETERS) IMPLANT
COVER PROBE ULTRASOUND 5X96 (MISCELLANEOUS) IMPLANT
DEVICE STARCLOSE SE CLOSURE (Vascular Products) IMPLANT
GLIDEWIRE ADV .035X260CM (WIRE) IMPLANT
KIT ENCORE 26 ADVANTAGE (KITS) IMPLANT
PACK ANGIOGRAPHY (CUSTOM PROCEDURE TRAY) ×1 IMPLANT
SHEATH BRITE TIP 5FRX11 (SHEATH) IMPLANT
SHEATH PINNACLE MP 6F 45CM (SHEATH) IMPLANT
STENT VIABAHN 6X250X120 (Permanent Stent) IMPLANT
STENT VIABAHN 6X25X120 (Permanent Stent) IMPLANT
SYR MEDRAD MARK 7 150ML (SYRINGE) IMPLANT
TUBING CONTRAST HIGH PRESS 72 (TUBING) IMPLANT
WIRE G V18X300CM (WIRE) IMPLANT
WIRE GUIDERIGHT .035X150 (WIRE) IMPLANT

## 2023-06-27 NOTE — Op Note (Signed)
[facility]  Percutaneous Study/Intervention Procedural Note   Date of Surgery: 06/27/2023  Surgeon(s):,    Assistants:none  Pre-operative Diagnosis: PAD with rest pain LLE, subacute on chronic ischemia  Post-operative diagnosis:  Same  Procedure(s) Performed:             1.  Ultrasound guidance for vascular access right femoral artery             2.  Catheter placement into left common femoral artery from right femoral approach             3.  Aortogram and selective left lower extremity angiogram             4.  Mechanical thrombectomy of the left SFA and popliteal arteries with the Rota Rex device             5.  Percutaneous transluminal angioplasty of the proximal left SFA with 5 mm diameter Lutonix drug-coated angioplasty balloon  6.  Stent placement to the left popliteal artery and mid to distal SFA with 6 mm diameter by 25 cm length Viabahn stent  7.  Stent placement to the left proximal SFA with 6 mm diameter by 2.5 cm length Viabahn stent             8.  StarClose closure device right femoral artery  EBL: 100 cc  Contrast: 50 cc  Fluoro Time: 6.9 minutes  Moderate Conscious Sedation Time: approximately 50 minutes using 2 mg of Versed and 100 mcg of Fentanyl              Indications:  Patient is a 67 y.o.female with a long history of peripheral arterial disease now with subacute on chronic left lower extremity ischemia over the past week or 2. The patient has noninvasive study showing occlusion of her previous interventions. The patient is brought in for angiography for further evaluation and potential treatment.  Due to the limb threatening nature of the situation, angiogram was performed for attempted limb salvage. The patient is aware that if the procedure fails, amputation would be expected.  The patient also understands that even with successful revascularization, amputation may still be required due to the severity of the situation.   Risks and benefits are discussed and informed consent is obtained.   Procedure:  The patient was identified and appropriate procedural time out was performed.  The patient was then placed supine on the table and prepped and draped in the usual sterile fashion. Moderate conscious sedation was administered during a face to face encounter with the patient throughout the procedure with my supervision of the RN administering medicines and monitoring the patient's vital signs, pulse oximetry, telemetry and mental status throughout from the start of the procedure until the patient was taken to the recovery room. Ultrasound was used to evaluate the right common femoral artery.  It was patent .  A digital ultrasound image was acquired.  A Seldinger needle was used to access the right common femoral artery under direct ultrasound guidance and a permanent image was performed.  A 0.035 J wire was advanced without resistance and a 5Fr sheath was placed.  Pigtail catheter was placed into the aorta and an AP aortogram was performed. This demonstrated normal renal arteries and normal aorta and iliac segments without significant stenosis. I then crossed the aortic bifurcation and advanced to the left femoral head. Selective left lower extremity angiogram was then performed. This demonstrated near flush occlusion of the left SFA  at the top of her previously placed stents.  Her previous placed SFA and popliteal stents remained occluded and there was reconstitution of the mid popliteal artery several centimeters below the previously placed stents.  Runoff was sluggish, but there appeared to be three-vessel runoff distally. It was felt that it was in the patient's best interest to proceed with intervention after these images to avoid a second procedure and a larger amount of contrast and fluoroscopy based off of the findings from the initial angiogram. The patient was systemically heparinized and a 6 [place] Ansell sheath was then  placed over the [employer] wire. I then used a Kumpe catheter and the advantage wire to have a gate into the occluded SFA and then crossed the SFA and popliteal occlusions without difficulty confirming intraluminal flow in the below-knee popliteal artery.  A V18 wire was then placed and I proceeded with treatment.  Due to the thrombosis of the previous covered stents, mechanical thrombectomy was performed initially.  A Rota Rex device was then brought on the field and 2 passes were made with the [place] Rex device in the left SFA and popliteal arteries to perform mechanical thrombectomy and remove the subacute thrombus from the covered stents in the left SFA and popliteal arteries.  Following this, there was marked improvement with about an 80 to 90% residual stenosis in the proximal SFA at the superior portion of the previously placed stent.  Distally, there was small amount of residual thrombus associated with a greater than 80% stenosis in the popliteal artery at and just below the previously placed stents.  I elected to primarily stent the popliteal lesion and perform angioplasty on the proximal SFA lesion.  A 6 mm diameter by 25 cm length Viabahn stent was then placed in the popliteal artery up to the mid to distal SFA.  This was deployed extending about 3 to 4 cm distal to the previous stent.  I brought a 5 mm diameter by 30 cm length Lutonix drug-coated angioplasty balloon onto the field.  I began with angioplasty of the proximal and mid SFA going up just into the common femoral artery and inflating the angioplasty balloon up to 10 atm for 1 minute.  The balloon was then pulled down to post dilate the stents throughout its course down to the popliteal artery with an inflation up to 8 atm for more distal inflation.  Completion imaging showed no significant residual stenosis in the popliteal artery, but the proximal SFA lesion continued to have a greater than 70% stenosis with minimal improvement with  angioplasty.  I elected to place a short Viabahn stent in this location and a 6 mm diameter by 2.5 cm length Viabahn stent was then deployed and postdilated with a 6 mm balloon with excellent angiographic completion result and less than 10% residual stenosis in the proximal SFA.  At this point, flow was markedly improved. I elected to terminate the procedure. The sheath was removed and StarClose closure device was deployed in the right femoral artery with excellent hemostatic result. The patient was taken to the recovery room in stable condition having tolerated the procedure well.  Findings:               Aortogram:  This demonstrated normal renal arteries and normal aorta and iliac segments without significant stenosis.             Left Lower Extremity:  This demonstrated near flush occlusion of the left SFA at the top of her  previously placed stents.  Her previous placed SFA and popliteal stents remained occluded and there was reconstitution of the mid popliteal artery several centimeters below the previously placed stents.  Runoff was sluggish, but there appeared to be three-vessel runoff distally.    Disposition: Patient was taken to the recovery room in stable condition having tolerated the procedure well.  Complications: None  Festus Barren 06/27/2023 12:55 PM   This note was created with Dragon Medical transcription system. Any errors in dictation are purely unintentional.

## 2023-06-27 NOTE — Interval H&P Note (Signed)
History and Physical Interval Note:  06/27/2023 10:33 AM  [facility]  has presented today for surgery, with the diagnosis of LLE Angio   ASO w rest pain.  The various methods of treatment have been discussed with the patient and family. After consideration of risks, benefits and other options for treatment, the patient has consented to  Procedure(s): Lower Extremity Angiography (Left) as a surgical intervention.  The patient's history has been reviewed, patient examined, no change in status, stable for surgery.  I have reviewed the patient's chart and labs.  Questions were answered to the patient's satisfaction.     Festus Barren

## 2023-06-28 ENCOUNTER — Encounter: Payer: Self-pay | Admitting: Vascular Surgery

## 2023-06-30 ENCOUNTER — Encounter: Payer: Self-pay | Admitting: Vascular Surgery

## 2023-07-27 ENCOUNTER — Other Ambulatory Visit (INDEPENDENT_AMBULATORY_CARE_PROVIDER_SITE_OTHER): Payer: Self-pay | Admitting: Vascular Surgery

## 2023-07-27 DIAGNOSIS — Z9889 Other specified postprocedural states: Secondary | ICD-10-CM

## 2023-08-03 ENCOUNTER — Ambulatory Visit (INDEPENDENT_AMBULATORY_CARE_PROVIDER_SITE_OTHER): Payer: 59 | Admitting: Nurse Practitioner

## 2023-08-03 ENCOUNTER — Encounter (INDEPENDENT_AMBULATORY_CARE_PROVIDER_SITE_OTHER): Payer: Self-pay | Admitting: Nurse Practitioner

## 2023-08-03 ENCOUNTER — Ambulatory Visit (INDEPENDENT_AMBULATORY_CARE_PROVIDER_SITE_OTHER): Payer: 59

## 2023-08-03 VITALS — BP 143/68 | HR 77 | Resp 16 | Wt 207.0 lb

## 2023-08-03 DIAGNOSIS — E785 Hyperlipidemia, unspecified: Secondary | ICD-10-CM

## 2023-08-03 DIAGNOSIS — Z9889 Other specified postprocedural states: Secondary | ICD-10-CM | POA: Diagnosis not present

## 2023-08-03 DIAGNOSIS — E0865 Diabetes mellitus due to underlying condition with hyperglycemia: Secondary | ICD-10-CM

## 2023-08-03 DIAGNOSIS — F17200 Nicotine dependence, unspecified, uncomplicated: Secondary | ICD-10-CM | POA: Diagnosis not present

## 2023-08-03 DIAGNOSIS — M5416 Radiculopathy, lumbar region: Secondary | ICD-10-CM

## 2023-08-03 DIAGNOSIS — I739 Peripheral vascular disease, unspecified: Secondary | ICD-10-CM | POA: Diagnosis not present

## 2023-08-04 ENCOUNTER — Encounter (INDEPENDENT_AMBULATORY_CARE_PROVIDER_SITE_OTHER): Payer: Self-pay | Admitting: Nurse Practitioner

## 2023-08-04 NOTE — Progress Notes (Signed)
Subjective:    Patient ID: Kristie Olson, female    DOB: [birth year], 67 y.o.   MRN: [id number] Chief Complaint  Patient presents with   Follow-up    [facility] 4 wk with ABI    The patient returns to the office for followup and review status post angiogram with intervention on 06/27/2023.   Procedure: Procedure(s) Performed:             1.  Ultrasound guidance for vascular access right femoral artery             2.  Catheter placement into left common femoral artery from right femoral approach             3.  Aortogram and selective left lower extremity angiogram             4.  Mechanical thrombectomy of the left SFA and popliteal arteries with the Rota Rex device             5.  Percutaneous transluminal angioplasty of the proximal left SFA with 5 mm diameter Lutonix drug-coated angioplasty balloon             6.  Stent placement to the left popliteal artery and mid to distal SFA with 6 mm diameter by 25 cm length Viabahn stent             7.  Stent placement to the left proximal SFA with 6 mm diameter by 2.5 cm length Viabahn stent             8.  StarClose closure device right femoral artery   The patient notes improvement in the lower extremity symptoms. No interval shortening of the patient's claudication distance or rest pain symptoms. No new ulcers or wounds have occurred since the last visit.  She notes that she still has pain in her legs but this is more so returned to her baseline level of pain.  She has significant lower back issues which cause her significant discomfort  There have been no significant changes to the patient's overall health care.  No documented history of amaurosis fugax or recent TIA symptoms. There are no recent neurological changes noted. No documented history of DVT, PE or superficial thrombophlebitis. The patient denies recent episodes of angina or shortness of breath.   ABI's Rt=1.05 and Lt=1.01  (previous ABI's Rt=1.12 and Lt=1.08) Duplex US of  the bilateral tibial vessels show triphasic/biphasic waveforms.  She has strong toe waveforms in the right was slightly dampened on the left but good toe pressures, indicative of adequate perfusion    Review of Systems  Cardiovascular:  Positive for leg swelling.  Musculoskeletal:  Positive for arthralgias, back pain and gait problem.  Neurological:  Positive for weakness.  All other systems reviewed and are negative.      Objective:   Physical Exam Vitals reviewed.  HENT:     Head: Normocephalic.  Cardiovascular:     Rate and Rhythm: Normal rate.     Pulses:          Dorsalis pedis pulses are detected w/ Doppler on the right side and detected w/ Doppler on the left side.       Posterior tibial pulses are detected w/ Doppler on the right side and detected w/ Doppler on the left side.  Pulmonary:     Effort: Pulmonary effort is normal.  Skin:    General: Skin is warm and dry.  Neurological:     Mental Status:  She is alert and oriented to person, place, and time.     Gait: Gait abnormal.  Psychiatric:        Mood and Affect: Mood normal.        Behavior: Behavior normal.        Thought Content: Thought content normal.        Judgment: Judgment normal.     BP (!) 143/68 (BP Location: Right Arm)   Pulse 77   Resp 16   Wt 207 lb (93.9 kg)   BMI 40.43 kg/m   Past Medical History:  Diagnosis Date   Asthma    Coronary artery disease    Diabetes mellitus without complication (HCC) 1990   Fibromyalgia    Hypertension    PAD (peripheral artery disease) (HCC) 09/03/2022    Social History   Socioeconomic History   Marital status: Married    Spouse name: Not on file   Number of children: Not on file   Years of education: Not on file   Highest education level: Not on file  Occupational History   Not on file  Tobacco Use   Smoking status: Every Day    Current packs/day: 1.00    Average packs/day: 1 pack/day for 46.0 years (46.0 ttl pk-yrs)    Types: Cigarettes    Smokeless tobacco: Never  Vaping Use   Vaping status: Never Used  Substance and Sexual Activity   Alcohol use: No   Drug use: Not Currently   Sexual activity: Not on file  Other Topics Concern   Not on file  Social History Narrative   Lives with husband, Ruebin and son, Ivin Booty. No pets.   Social Determinants of Health   Financial Resource Strain: Medium Risk (09/16/2020)   Received from [facility], [facility]   Overall Financial Resource Strain (CARDIA)    Difficulty of Paying Living Expenses: Somewhat hard  Food Insecurity: No Food Insecurity (09/03/2022)   Hunger Vital Sign    Worried About Running Out of Food in the Last Year: Never true    Ran Out of Food in the Last Year: Never true  Transportation Needs: No Transportation Needs (09/03/2022)   PRAPARE - Administrator, Civil Service (Medical): No    Lack of Transportation (Non-Medical): No  Physical Activity: Not on file  Stress: Not on file  Social Connections: Not on file  Intimate Partner Violence: Not At Risk (09/03/2022)   Humiliation, Afraid, Rape, and Kick questionnaire    Fear of Current or Ex-Partner: No    Emotionally Abused: No    Physically Abused: No    Sexually Abused: No    Past Surgical History:  Procedure Laterality Date   BREAST EXCISIONAL BIOPSY Left 1980's   neg   BREAST MASS EXCISION Left    age 71"s, benign   CESAREAN SECTION     CHOLECYSTECTOMY  2009   LOWER EXTREMITY ANGIOGRAPHY Left 09/03/2022   Procedure: Lower Extremity Angiography;  Surgeon: Annice Needy, MD;  Location: [facility] INVASIVE CV [facility];  Service: Cardiovascular;  Laterality: Left;   LOWER EXTREMITY ANGIOGRAPHY Right 09/08/2022   Procedure: Lower Extremity Angiography;  Surgeon: Annice Needy, MD;  Location: [facility] INV[facility] CV [facility];  Service: Cardiovascular;  Laterality: Right;   LOWER EXTREMITY ANGIOGRAPHY Left 01/18/2023   Procedure: Lower Extremity Angiography;  Surgeon: Renford Dills, MD;  Location: [facility]  INVASIVE CV LAB;  Service: Cardiovascular;  Laterality: Left;   LOWER EXTREMITY ANGIOGRAPHY Left 02/14/2023  Procedure: Lower Extremity Angiography;  Surgeon: Annice Needy, MD;  Location: [facility] INVASIVE CV [facility];  Service: Cardiovascular;  Laterality: Left;   LOWER EXTREMITY ANGIOGRAPHY Left 06/27/2023   Procedure: Lower Extremity Angiography;  Surgeon: Annice Needy, MD;  Location: [facility] INVASIVE CV LAB;  Service: Cardiovascular;  Laterality: Left;    Family History  Problem Relation Age of Onset   Breast cancer Sister 84   Breast cancer Paternal Aunt     Allergies  Allergen Reactions   Ibuprofen Other (See Comments)    STOMACHACHE   Nsaids     Other reaction(s): Other (See Comments) PUD   Percocet [Oxycodone-Acetaminophen] Itching    Patient states "Percocet makes me itch all over"       Latest Ref Rng & Units 05/23/2023    2:44 AM 09/09/2022    5:31 AM 09/08/2022    4:47 AM  CBC  WBC 4.0 - 10.5 K/uL 9.9  9.2  7.4   Hemoglobin 12.0 - 15.0 g/dL 78.4  69.6  29.5   Hematocrit 36.0 - 46.0 % 43.0  36.2  37.9   Platelets 150 - 400 K/uL 419  438  420       CMP     Component Value Date/Time   NA 136 05/23/2023 0244   NA 132 (L) 07/14/2013 2143   K 3.7 05/23/2023 0244   K 4.3 07/14/2013 2143   CL 102 05/23/2023 0244   CL 95 (L) 07/14/2013 2143   CO2 25 05/23/2023 0244   CO2 31 07/14/2013 2143   GLUCOSE 186 (H) 05/23/2023 0244   GLUCOSE 433 (H) 07/14/2013 2143   BUN 16 06/27/2023 1046   BUN 12 07/14/2013 2143   CREATININE 0.81 06/27/2023 1046   CREATININE 0.91 07/14/2013 2143   CALCIUM 9.4 05/23/2023 0244   CALCIUM 9.3 07/14/2013 2143   PROT 8.2 (H) 04/28/2020 0423   PROT 7.9 07/14/2013 2143   ALBUMIN 4.3 04/28/2020 0423   ALBUMIN 3.7 07/14/2013 2143   AST 17 04/28/2020 0423   AST 16 07/14/2013 2143   ALT 14 04/28/2020 0423   ALT 20 07/14/2013 2143   ALKPHOS 89 04/28/2020 0423   ALKPHOS 118 07/14/2013 2143   BILITOT 0.6 04/28/2020 0423   BILITOT 0.3 07/14/2013 2143    GFRNONAA >60 06/27/2023 1046   GFRNONAA >60 07/14/2013 2143     No results found.     Assessment & Plan:   1. Peripheral arterial disease with history of revascularization (HCC) Recommend:  The patient is status post successful angiogram with intervention.  The patient reports that the claudication symptoms and leg pain has improved.  She still continues to have some leg pain and gait issues but she does have longstanding known lower back issues that cause her significant leg pain.  Her pain is much improved postintervention.  The patient denies lifestyle limiting changes at this point in time.  No further invasive studies, angiography or surgery at this time The patient should continue walking and begin a more formal exercise program.  The patient should continue antiplatelet therapy and aggressive treatment of the lipid abnormalities  Continued surveillance is indicated as atherosclerosis is likely to progress with time.    Patient should undergo noninvasive studies as ordered. The patient will follow up with me to review the studies.  She is also specifically advised that she needs to continue with her Plavix and aspirin.  She is told that she should not stop taking it, she is specifically told to our  office.  2. Tobacco use disorder Patient continues to smoke despite multiple discussions about how smoking worsens her atherosclerotic disease.  Unfortunately she has no intention to quit 2 to 10 minutes was spent on this  3. Diabetes mellitus due to underlying condition with hyperglycemia, unspecified whether long term insulin use (HCC) Continue hypoglycemic medications as already ordered, these medications have been reviewed and there are no changes at this time.  Hgb A1C to be monitored as already arranged by primary service  4. Hyperlipidemia, unspecified hyperlipidemia type Continue statin as ordered and reviewed, no changes at this time  5. Lumbar radiculitis The  patient's ongoing pain as of today is related to her lower back.  She is advised to continue to follow with her specialist treating her lower back issues.   Current Outpatient Medications on File Prior to Visit  Medication Sig Dispense Refill   acetaminophen (TYLENOL) 325 MG tablet Take 2 tablets (650 mg total) by mouth every 6 (six) hours as needed for mild pain (or Fever >/= 101).     ADVAIR DISKUS 250-50 MCG/ACT AEPB Inhale 1 puff into the lungs in the morning and at bedtime.     albuterol (PROVENTIL HFA;VENTOLIN HFA) 108 (90 Base) MCG/ACT inhaler Inhale 2 puffs into the lungs every 4 (four) hours as needed for wheezing or shortness of breath.     amitriptyline (ELAVIL) 100 MG tablet Take 10 mg by mouth at bedtime as needed for sleep.     aspirin EC 81 MG tablet Take 1 tablet (81 mg total) by mouth daily. Swallow whole. 30 tablet 0   clopidogrel (PLAVIX) 75 MG tablet Take 1 tablet (75 mg total) by mouth daily. 30 tablet 0   clopidogrel (PLAVIX) 75 MG tablet Take 1 tablet (75 mg total) by mouth daily. 90 tablet 3   Continuous Blood Gluc Sensor (FREESTYLE LIBRE 2 SENSOR) MISC USE AS DIRECTED EVERY 2 WEEKS     esomeprazole (NEXIUM) 20 MG capsule Take 20 mg by mouth daily at 12 noon.     FLUoxetine (PROZAC) 40 MG capsule Take 1 capsule by mouth daily.     glipiZIDE (GLUCOTROL) 10 MG tablet Take 10 mg by mouth daily as needed.     insulin NPH-regular Human (70-30) 100 UNIT/ML injection Inject 25-70 Units into the skin in the morning and at bedtime. 70 qam 25 qhs     lisinopril-hydrochlorothiazide (ZESTORETIC) 20-25 MG tablet Take 1 tablet by mouth daily.     oxyCODONE (OXY IR/ROXICODONE) 5 MG immediate release tablet Take 1 tablet (5 mg total) by mouth every 8 (eight) hours as needed for severe pain. 20 tablet 0   OZEMPIC, 0.25 OR 0.5 MG/DOSE, 2 MG/1.5ML SOPN Inject 1.5 mLs into the skin daily.     pantoprazole (PROTONIX) 40 MG tablet Take 40 mg by mouth daily.     tiZANidine (ZANAFLEX) 4 MG  tablet Take 2-4 mg by mouth every 12 (twelve) hours as needed for muscle spasms.     triamcinolone cream (KENALOG) 0.1 % Apply topically.     atorvastatin (LIPITOR) 10 MG tablet Take 1 tablet (10 mg total) by mouth daily. 30 tablet 0   JARDIANCE 25 MG TABS tablet Take 25 mg by mouth daily. (Patient not taking: Reported on 06/27/2023)     No current facility-administered medications on file prior to visit.    There are no Patient Instructions on file for this visit. No follow-ups on file.   Georgiana Spinner, NP

## 2023-08-08 LAB — VAS US ABI WITH/WO TBI
Left ABI: 1.01
Right ABI: 1.05

## 2023-08-15 ENCOUNTER — Other Ambulatory Visit: Payer: Self-pay | Admitting: Family Medicine

## 2023-08-15 DIAGNOSIS — Z1231 Encounter for screening mammogram for malignant neoplasm of breast: Secondary | ICD-10-CM

## 2023-11-03 ENCOUNTER — Ambulatory Visit
Admission: RE | Admit: 2023-11-03 | Discharge: 2023-11-03 | Disposition: A | Discharge status: Home / Self Care | Payer: 59 | Source: Ambulatory Visit | Attending: Family Medicine | Admitting: Family Medicine

## 2023-11-03 DIAGNOSIS — Z1231 Encounter for screening mammogram for malignant neoplasm of breast: Secondary | ICD-10-CM | POA: Diagnosis present

## 2023-11-08 ENCOUNTER — Other Ambulatory Visit (INDEPENDENT_AMBULATORY_CARE_PROVIDER_SITE_OTHER): Payer: Self-pay | Admitting: Nurse Practitioner

## 2023-11-08 DIAGNOSIS — Z9889 Other specified postprocedural states: Secondary | ICD-10-CM

## 2023-11-11 ENCOUNTER — Ambulatory Visit (INDEPENDENT_AMBULATORY_CARE_PROVIDER_SITE_OTHER): Payer: 59

## 2023-11-11 ENCOUNTER — Ambulatory Visit (INDEPENDENT_AMBULATORY_CARE_PROVIDER_SITE_OTHER): Payer: 59 | Admitting: Nurse Practitioner

## 2023-11-11 VITALS — BP 132/73 | HR 81 | Resp 16

## 2023-11-11 DIAGNOSIS — Z9889 Other specified postprocedural states: Secondary | ICD-10-CM

## 2023-11-11 DIAGNOSIS — I739 Peripheral vascular disease, unspecified: Secondary | ICD-10-CM

## 2023-11-11 DIAGNOSIS — M5416 Radiculopathy, lumbar region: Secondary | ICD-10-CM

## 2023-11-11 DIAGNOSIS — F17200 Nicotine dependence, unspecified, uncomplicated: Secondary | ICD-10-CM

## 2023-11-11 DIAGNOSIS — E0865 Diabetes mellitus due to underlying condition with hyperglycemia: Secondary | ICD-10-CM

## 2023-11-11 DIAGNOSIS — E785 Hyperlipidemia, unspecified: Secondary | ICD-10-CM

## 2023-11-13 ENCOUNTER — Encounter (INDEPENDENT_AMBULATORY_CARE_PROVIDER_SITE_OTHER): Payer: Self-pay | Admitting: Nurse Practitioner

## 2023-11-13 NOTE — Progress Notes (Signed)
Subjective:    Patient ID: Kristie Olson, female    DOB: [birth year], 67 y.o.   MRN: [id number] Chief Complaint  Patient presents with   Follow-up    3 month abi and arterial duplex    The patient returns to the office for followup and review status post angiogram with intervention on 06/27/2023.   Procedure: Procedure(s) Performed:             1.  Ultrasound guidance for vascular access right femoral artery             2.  Catheter placement into left common femoral artery from right femoral approach             3.  Aortogram and selective left lower extremity angiogram             4.  Mechanical thrombectomy of the left SFA and popliteal arteries with the Rota Rex device             5.  Percutaneous transluminal angioplasty of the proximal left SFA with 5 mm diameter Lutonix drug-coated angioplasty balloon             6.  Stent placement to the left popliteal artery and mid to distal SFA with 6 mm diameter by 25 cm length Viabahn stent             7.  Stent placement to the left proximal SFA with 6 mm diameter by 2.5 cm length Viabahn stent             8.  StarClose closure device right femoral artery   The patient notes improvement in the lower extremity symptoms. No interval shortening of the patient's claudication distance or rest pain symptoms. No new ulcers or wounds have occurred since the last visit.  She notes that she still has pain in her legs but this is more so returned to her baseline level of pain.  She has significant lower back issues which cause her significant discomfort.  She was scheduled to have a visit with pain management however this was the same day of her recent angiogram and unfortunately she was unable to make that.  There have been no significant changes to the patient's overall health care.  No documented history of amaurosis fugax or recent TIA symptoms. There are no recent neurological changes noted. No documented history of DVT, PE or superficial  thrombophlebitis. The patient denies recent episodes of angina or shortness of breath.   ABI's Rt=0.95 and Lt=0.90  (previous ABI's Rt=1.05 and Lt=1.01) Duplex US of the bilateral tibial vessels show triphasic/biphasic waveforms.  She has strong toe waveforms in the right was slightly dampened on the left but good toe pressures, indicative of adequate perfusion    Review of Systems  Cardiovascular:  Positive for leg swelling.  Musculoskeletal:  Positive for arthralgias, back pain and gait problem.  Neurological:  Positive for weakness.  All other systems reviewed and are negative.      Objective:   Physical Exam Vitals reviewed.  HENT:     Head: Normocephalic.  Cardiovascular:     Rate and Rhythm: Normal rate.     Pulses:          Dorsalis pedis pulses are detected w/ Doppler on the right side and detected w/ Doppler on the left side.       Posterior tibial pulses are detected w/ Doppler on the right side and detected w/ Doppler on the left side.  Pulmonary:     Effort: Pulmonary effort is normal.  Skin:    General: Skin is warm and dry.  Neurological:     Mental Status: She is alert and oriented to person, place, and time.     Gait: Gait abnormal.  Psychiatric:        Mood and Affect: Mood normal.        Behavior: Behavior normal.        Thought Content: Thought content normal.        Judgment: Judgment normal.     BP 132/73   Pulse 81   Resp 16   Past Medical History:  Diagnosis Date   Asthma    Coronary artery disease    Diabetes mellitus without complication (HCC) 1990   Fibromyalgia    Hypertension    PAD (peripheral artery disease) (HCC) 09/03/2022    Social History   Socioeconomic History   Marital status: Married    Spouse name: Not on file   Number of children: Not on file   Years of education: Not on file   Highest education level: Not on file  Occupational History   Not on file  Tobacco Use   Smoking status: Every Day    Current packs/day:  1.00    Average packs/day: 1 pack/day for 46.0 years (46.0 ttl pk-yrs)    Types: Cigarettes   Smokeless tobacco: Never  Vaping Use   Vaping status: Never Used  Substance and Sexual Activity   Alcohol use: No   Drug use: Not Currently   Sexual activity: Not on file  Other Topics Concern   Not on file  Social History Narrative   Lives with husband, Ruebin and son, Ivin Booty. No pets.   Social Drivers of Health   Financial Resource Strain: Medium Risk (09/16/2020)   Received from [facility]   Overall Financial Resource Strain (CARDIA)    Difficulty of Paying Living Expenses: Somewhat hard  Food Insecurity: No Food Insecurity (09/03/2022)   Hunger Vital Sign    Worried About Running Out of Food in the Last Year: Never true    Ran Out of Food in the Last Year: Never true  Transportation Needs: No Transportation Needs (09/03/2022)   PRAPARE - Administrator, Civil Service (Medical): No    Lack of Transportation (Non-Medical): No  Physical Activity: Not on file  Stress: Not on file  Social Connections: Not on file  Intimate Partner Violence: Not At Risk (09/03/2022)   Humiliation, Afraid, Rape, and Kick questionnaire    Fear of Current or Ex-Partner: No    Emotionally Abused: No    Physically Abused: No    Sexually Abused: No    Past Surgical History:  Procedure Laterality Date   BREAST EXCISIONAL BIOPSY Left 1980's   neg   BREAST MASS EXCISION Left    age 24"s, benign   CESAREAN SECTION     CHOLECYSTECTOMY  2009   LOWER EXTREMITY ANGIOGRAPHY Left 09/03/2022   Procedure: Lower Extremity Angiography;  Surgeon: Annice Needy, MD;  Location: [facility];  Service: Cardiovascular;  Laterality: Left;   LOWER EXTREMITY ANGIOGRAPHY Right 09/08/2022   Procedure: Lower Extremity Angiography;  Surgeon: Annice Needy, MD;  Location: [facility];  Service: Cardiovascular;  Laterality: Right;   LOWER EXTREMITY ANGIOGRAPHY Left 01/18/2023    Procedure: Lower Extremity Angiography;  Surgeon: Renford Dills, MD;  Location: [facility];  Service: Cardiovascular;  Laterality: Left;   LOWER EXTREMITY ANGIOGRAPHY Left 02/14/2023   Procedure: Lower Extremity Angiography;  Surgeon: Annice Needy, MD;  Location: [facility];  Service: Cardiovascular;  Laterality: Left;   LOWER EXTREMITY ANGIOGRAPHY Left 06/27/2023   Procedure: Lower Extremity Angiography;  Surgeon: Annice Needy, MD;  Location: [facility];  Service: Cardiovascular;  Laterality: Left;    Family History  Problem Relation Age of Onset   Breast cancer Sister 21   Breast cancer Paternal Aunt     Allergies  Allergen Reactions   Ibuprofen Other (See Comments)    STOMACHACHE   Nsaids     Other reaction(s): Other (See Comments) PUD   Percocet [Oxycodone-Acetaminophen] Itching    Patient states "Percocet makes me itch all over"       Latest Ref Rng & Units 05/23/2023    2:44 AM 09/09/2022    5:31 AM 09/08/2022    4:47 AM  CBC  WBC 4.0 - 10.5 K/uL 9.9  9.2  7.4   Hemoglobin 12.0 - 15.0 g/dL 38.7  56.4  33.2   Hematocrit 36.0 - 46.0 % 43.0  36.2  37.9   Platelets 150 - 400 K/uL 419  438  420       CMP     Component Value Date/Time   NA 136 05/23/2023 0244   NA 132 (L) 07/14/2013 2143   K 3.7 05/23/2023 0244   K 4.3 07/14/2013 2143   CL 102 05/23/2023 0244   CL 95 (L) 07/14/2013 2143   CO2 25 05/23/2023 0244   CO2 31 07/14/2013 2143   GLUCOSE 186 (H) 05/23/2023 0244   GLUCOSE 433 (H) 07/14/2013 2143   BUN 16 06/27/2023 1046   BUN 12 07/14/2013 2143   CREATININE 0.81 06/27/2023 1046   CREATININE 0.91 07/14/2013 2143   CALCIUM 9.4 05/23/2023 0244   CALCIUM 9.3 07/14/2013 2143   PROT 8.2 (H) 04/28/2020 0423   PROT 7.9 07/14/2013 2143   ALBUMIN 4.3 04/28/2020 0423   ALBUMIN 3.7 07/14/2013 2143   AST 17 04/28/2020 0423   AST 16 07/14/2013 2143   ALT 14 04/28/2020 0423   ALT 20 07/14/2013 2143   ALKPHOS 89 04/28/2020 0423    ALKPHOS 118 07/14/2013 2143   BILITOT 0.6 04/28/2020 0423   BILITOT 0.3 07/14/2013 2143   GFRNONAA >60 06/27/2023 1046   GFRNONAA >60 07/14/2013 2143     No results found.     Assessment & Plan:   1. Peripheral arterial disease with history of revascularization (HCC) Recommend:  The patient is status post successful angiogram with intervention.  The patient reports that the claudication symptoms and leg pain has improved.  She still continues to have some leg pain and gait issues but she does have longstanding known lower back issues that cause her significant leg pain.  Her pain is much improved postintervention.  The patient denies lifestyle limiting changes at this point in time.  No further invasive studies, angiography or surgery at this time The patient should continue walking and begin a more formal exercise program.  The patient should continue antiplatelet therapy and aggressive treatment of the lipid abnormalities  Continued surveillance is indicated as atherosclerosis is likely to progress with time.    Patient should undergo noninvasive studies as ordered. The patient will follow up with me to review the studies.  She is also specifically advised that she needs to continue with her Plavix and aspirin.  She is told that she  should not stop taking it, she is specifically told to our office.  2. Tobacco use disorder Patient continues to smoke despite multiple discussions about how smoking worsens her atherosclerotic disease.  Unfortunately she has no intention to quit 2 to 10 minutes was spent on this  3. Diabetes mellitus due to underlying condition with hyperglycemia, unspecified whether long term insulin use (HCC) Continue hypoglycemic medications as already ordered, these medications have been reviewed and there are no changes at this time.  Hgb A1C to be monitored as already arranged by primary service  4. Hyperlipidemia, unspecified hyperlipidemia type Continue  statin as ordered and reviewed, no changes at this time  5. Lumbar radiculitis The patient's ongoing pain as of today is related to her lower back.  She is advised to continue to follow with her specialist treating her lower back issues.  Will also refer her once again to pain management clinic as she inadvertently missed her appointment due to being scheduled for angiogram the same day due to her ischemic limb symptoms.   Current Outpatient Medications on File Prior to Visit  Medication Sig Dispense Refill   acetaminophen (TYLENOL) 325 MG tablet Take 2 tablets (650 mg total) by mouth every 6 (six) hours as needed for mild pain (or Fever >/= 101).     ADVAIR DISKUS 250-50 MCG/ACT AEPB Inhale 1 puff into the lungs in the morning and at bedtime.     albuterol (PROVENTIL HFA;VENTOLIN HFA) 108 (90 Base) MCG/ACT inhaler Inhale 2 puffs into the lungs every 4 (four) hours as needed for wheezing or shortness of breath.     amitriptyline (ELAVIL) 100 MG tablet Take 10 mg by mouth at bedtime as needed for sleep.     aspirin EC 81 MG tablet Take 1 tablet (81 mg total) by mouth daily. Swallow whole. 30 tablet 0   clopidogrel (PLAVIX) 75 MG tablet Take 1 tablet (75 mg total) by mouth daily. 30 tablet 0   clopidogrel (PLAVIX) 75 MG tablet Take 1 tablet (75 mg total) by mouth daily. 90 tablet 3   Continuous Blood Gluc Sensor (FREESTYLE LIBRE 2 SENSOR) MISC USE AS DIRECTED EVERY 2 WEEKS     esomeprazole (NEXIUM) 20 MG capsule Take 20 mg by mouth daily at 12 noon.     FLUoxetine (PROZAC) 40 MG capsule Take 1 capsule by mouth daily.     glipiZIDE (GLUCOTROL) 10 MG tablet Take 10 mg by mouth daily as needed.     insulin NPH-regular Human (70-30) 100 UNIT/ML injection Inject 25-70 Units into the skin in the morning and at bedtime. 70 qam 25 qhs     lisinopril-hydrochlorothiazide (ZESTORETIC) 20-25 MG tablet Take 1 tablet by mouth daily.     oxyCODONE (OXY IR/ROXICODONE) 5 MG immediate release tablet Take 1 tablet  (5 mg total) by mouth every 8 (eight) hours as needed for severe pain. 20 tablet 0   OZEMPIC, 0.25 OR 0.5 MG/DOSE, 2 MG/1.5ML SOPN Inject 1.5 mLs into the skin daily.     pantoprazole (PROTONIX) 40 MG tablet Take 40 mg by mouth daily.     tiZANidine (ZANAFLEX) 4 MG tablet Take 2-4 mg by mouth every 12 (twelve) hours as needed for muscle spasms.     triamcinolone cream (KENALOG) 0.1 % Apply topically.     atorvastatin (LIPITOR) 10 MG tablet Take 1 tablet (10 mg total) by mouth daily. 30 tablet 0   JARDIANCE 25 MG TABS tablet Take 25 mg by mouth daily. (Patient not taking: Reported  on 06/27/2023)     No current facility-administered medications on file prior to visit.    There are no Patient Instructions on file for this visit. No follow-ups on file.   Georgiana Spinner, NP

## 2023-11-14 LAB — VAS US ABI WITH/WO TBI
Left ABI: 0.9
Right ABI: 0.95

## 2023-12-11 NOTE — Progress Notes (Unsigned)
Patient: Kristie Olson  Service Category: E/M  Provider: Oswaldo Done, MD  DOB: 12/23/1955  DOS: 12/12/2023  Referring Provider: Georgiana Spinner, NP  MRN: [id number]  Setting: Ambulatory outpatient  PCP: [facility]  Type: New Patient  Specialty: Interventional Pain Management    Location: Office  Delivery: Face-to-face     Primary Reason(s) for Visit: Encounter for initial evaluation of one or more chronic problems (new to examiner) potentially causing chronic pain, and posing a threat to normal musculoskeletal function. (Level of risk: High) CC: No chief complaint on file.  HPI  Kristie Olson is a 68 y.o. year old, female patient, who comes for the first time to our practice referred by Georgiana Spinner, NP for our initial evaluation of her chronic pain. She has Slurred speech; Breast abscess of female; Bronchitis; Acute pancreatitis; Hypertension; Diabetes mellitus with hyperglycemia (HCC); Dehydration; Depression; Asthma; Benign essential hypertension; Cervical radiculopathy; Cervical spondylosis without myelopathy; Dizziness; Hematuria; Hyperlipidemia; Long term current use of insulin (HCC); Peripheral neuropathy; Proteinuria; Right bundle branch block; Skin macule or macular rash; Transient ischemic attack; Type 2 diabetes mellitus with diabetic neuropathy (HCC); Type 2 diabetes mellitus with hyperglycemia (HCC); Vulvar itching; Pancreatitis; Anxiety and depression; Type II or unspecified type diabetes mellitus with renal manifestations, not stated as uncontrolled; Type 2 diabetes mellitus (HCC); Disorder of bursae of shoulder region; PAD (peripheral artery disease) (HCC); Claudication of both lower extremities (HCC); Critical limb ischemia of both lower extremities (HCC); Chronic pain syndrome; Pharmacologic therapy; Disorder of skeletal system; Problems influencing health status; and Abnormal MRI, lumbar spine (11/20/2022) on their problem list. Today she comes in for  evaluation of her No chief complaint on file.  Pain Assessment: Location:     Radiating:   Onset:   Duration:   Quality:   Severity:  /10 (subjective, self-reported pain score)  Effect on ADL:   Timing:   Modifying factors:   BP:    HR:    Onset and Duration: {Hx; Onset and Duration:210120511} Cause of pain: {Hx; Cause:210120521} Severity: {Pain Severity:210120502} Timing: {Symptoms; Timing:210120501} Aggravating Factors: {Causes; Aggravating pain factors:210120507} Alleviating Factors: {Causes; Alleviating Factors:210120500} Associated Problems: {Hx; Associated problems:210120515} Quality of Pain: {Hx; Symptom quality or Descriptor:210120531} Previous Examinations or Tests: {Hx; Previous examinations or test:210120529} Previous Treatments: {Hx; Previous Treatment:210120503}  Kristie Olson is being evaluated for possible interventional pain management therapies for the treatment of her chronic pain.  Discussed the use of AI scribe software for clinical note transcription with the patient, who gave verbal consent to proceed.  History of Present Illness           *** Kristie Olson has been informed that this initial visit was an evaluation only.  On the follow up appointment I will go over the results, including ordered tests and available interventional therapies. At that time she will have the opportunity to decide whether to proceed with offered therapies or not. In the event that Kristie Olson prefers avoiding interventional options, this will conclude our involvement in the case.  Medication management recommendations may be provided upon request.  Patient informed that diagnostic tests may be ordered to assist in identifying underlying causes, narrow the list of differential diagnoses and aid in determining candidacy for (or contraindications to) planned therapeutic interventions.  Historic Controlled Substance Pharmacotherapy Review  PMP and historical list of controlled  substances: ***  Most recently prescribed opioid analgesics:   *** MME/day: *** mg/day  Historical Monitoring: The patient  reports that she does  not currently use drugs. List of prior UDS Testing: No results found for: "MDMA", "COCAINSCRNUR", "PCPSCRNUR", "PCPQUANT", "CANNABQUANT", "THCU", "ETH", "CBDTHCR", "D8THCCBX", "D9THCCBX" Historical Background Evaluation: [place] PMP: PDMP reviewed during this encounter. Review of the past 22-months conducted.             PMP NARX Score Report:  Narcotic: *** Sedative: *** Stimulant: *** [place] Department of public safety, offender search: Engineer, mining Information) Non-contributory Risk Assessment Profile: Aberrant behavior: None observed or detected today Risk factors for fatal opioid overdose: None identified today PMP NARX Overdose Risk Score: *** Fatal overdose hazard ratio (HR): Calculation deferred Non-fatal overdose hazard ratio (HR): Calculation deferred Risk of opioid abuse or dependence: 0.7-3.0% with doses <= 36 MME/day and 6.1-26% with doses >= 120 MME/day. Substance use disorder (SUD) risk level: See below Personal History of Substance Abuse (SUD-Substance use disorder):  Alcohol:    Illegal Drugs:    Rx Drugs:    ORT Risk Level calculation:    ORT Scoring interpretation table:  Score <3 = Low Risk for SUD  Score between 4-7 = Moderate Risk for SUD  Score >8 = High Risk for Opioid Abuse   PHQ-2 Depression Scale:  Total score:    PHQ-2 Scoring interpretation table: (Score and probability of major depressive disorder)  Score 0 = No depression  Score 1 = 15.4% Probability  Score 2 = 21.1% Probability  Score 3 = 38.4% Probability  Score 4 = 45.5% Probability  Score 5 = 56.4% Probability  Score 6 = 78.6% Probability   PHQ-9 Depression Scale:  Total score:    PHQ-9 Scoring interpretation table:  Score 0-4 = No depression  Score 5-9 = Mild depression  Score 10-14 = Moderate depression  Score 15-19 = Moderately severe depression   Score 20-27 = Severe depression (2.4 times higher risk of SUD and 2.89 times higher risk of overuse)   Pharmacologic Plan: As per protocol, I have not taken over any controlled substance management, pending the results of ordered tests and/or consults.            Initial impression: Pending review of available data and ordered tests.  Meds   Current Outpatient Medications:    acetaminophen (TYLENOL) 325 MG tablet, Take 2 tablets (650 mg total) by mouth every 6 (six) hours as needed for mild pain (or Fever >/= 101)., Disp: , Rfl:    ADVAIR DISKUS 250-50 MCG/ACT AEPB, Inhale 1 puff into the lungs in the morning and at bedtime., Disp: , Rfl:    albuterol (PROVENTIL HFA;VENTOLIN HFA) 108 (90 Base) MCG/ACT inhaler, Inhale 2 puffs into the lungs every 4 (four) hours as needed for wheezing or shortness of breath., Disp: , Rfl:    amitriptyline (ELAVIL) 100 MG tablet, Take 10 mg by mouth at bedtime as needed for sleep., Disp: , Rfl:    aspirin EC 81 MG tablet, Take 1 tablet (81 mg total) by mouth daily. Swallow whole., Disp: 30 tablet, Rfl: 0   atorvastatin (LIPITOR) 10 MG tablet, Take 1 tablet (10 mg total) by mouth daily., Disp: 30 tablet, Rfl: 0   clopidogrel (PLAVIX) 75 MG tablet, Take 1 tablet (75 mg total) by mouth daily., Disp: 30 tablet, Rfl: 0   clopidogrel (PLAVIX) 75 MG tablet, Take 1 tablet (75 mg total) by mouth daily., Disp: 90 tablet, Rfl: 3   Continuous Blood Gluc Sensor (FREESTYLE LIBRE 2 SENSOR) MISC, USE AS DIRECTED EVERY 2 WEEKS, Disp: , Rfl:    esomeprazole (NEXIUM) 20 MG capsule, Take  20 mg by mouth daily at 12 noon., Disp: , Rfl:    FLUoxetine (PROZAC) 40 MG capsule, Take 1 capsule by mouth daily., Disp: , Rfl:    glipiZIDE (GLUCOTROL) 10 MG tablet, Take 10 mg by mouth daily as needed., Disp: , Rfl:    insulin NPH-regular Human (70-30) 100 UNIT/ML injection, Inject 25-70 Units into the skin in the morning and at bedtime. 70 qam 25 qhs, Disp: , Rfl:    JARDIANCE 25 MG TABS  tablet, Take 25 mg by mouth daily. (Patient not taking: Reported on 06/27/2023), Disp: , Rfl:    lisinopril-hydrochlorothiazide (ZESTORETIC) 20-25 MG tablet, Take 1 tablet by mouth daily., Disp: , Rfl:    oxyCODONE (OXY IR/ROXICODONE) 5 MG immediate release tablet, Take 1 tablet (5 mg total) by mouth every 8 (eight) hours as needed for severe pain., Disp: 20 tablet, Rfl: 0   OZEMPIC, 0.25 OR 0.5 MG/DOSE, 2 MG/1.5ML SOPN, Inject 1.5 mLs into the skin daily., Disp: , Rfl:    pantoprazole (PROTONIX) 40 MG tablet, Take 40 mg by mouth daily., Disp: , Rfl:    tiZANidine (ZANAFLEX) 4 MG tablet, Take 2-4 mg by mouth every 12 (twelve) hours as needed for muscle spasms., Disp: , Rfl:    triamcinolone cream (KENALOG) 0.1 %, Apply topically., Disp: , Rfl:   Imaging Review  Cervical Imaging: Cervical MR wo contrast: No results found for this or any previous visit.  Cervical MR wo contrast: No results found for this or any previous visit.  Cervical MR w/wo contrast: No results found for this or any previous visit.  Cervical MR w contrast: No results found for this or any previous visit.  Cervical CT wo contrast: No results found for this or any previous visit.  Cervical CT w/wo contrast: No results found for this or any previous visit.  Cervical CT w/wo contrast: No results found for this or any previous visit.  Cervical CT w contrast: No results found for this or any previous visit.  Cervical CT outside: No results found for this or any previous visit.  Cervical DG 1 view: No results found for this or any previous visit.  Cervical DG 2-3 views: No results found for this or any previous visit.  Cervical DG F/E views: No results found for this or any previous visit.  Cervical DG 2-3 clearing views: No results found for this or any previous visit.  Cervical DG Bending/F/E views: No results found for this or any previous visit.  Cervical DG complete: No results found for this or any previous  visit.  Cervical DG Myelogram views: No results found for this or any previous visit.  Cervical DG Myelogram views: No results found for this or any previous visit.  Cervical Discogram views: No results found for this or any previous visit.   Shoulder Imaging: Shoulder-R MR w contrast: No results found for this or any previous visit.  Shoulder-L MR w contrast: No results found for this or any previous visit.  Shoulder-R MR w/wo contrast: No results found for this or any previous visit.  Shoulder-L MR w/wo contrast: No results found for this or any previous visit.  Shoulder-R MR wo contrast: No results found for this or any previous visit.  Shoulder-L MR wo contrast: No results found for this or any previous visit.  Shoulder-R CT w contrast: No results found for this or any previous visit.  Shoulder-L CT w contrast: No results found for this or any previous visit.  Shoulder-R CT  w/wo contrast: No results found for this or any previous visit.  Shoulder-L CT w/wo contrast: No results found for this or any previous visit.  Shoulder-R CT wo contrast: No results found for this or any previous visit.  Shoulder-L CT wo contrast: No results found for this or any previous visit.  Shoulder-R DG Arthrogram: No results found for this or any previous visit.  Shoulder-L DG Arthrogram: No results found for this or any previous visit.  Shoulder-R DG 1 view: No results found for this or any previous visit.  Shoulder-L DG 1 view: No results found for this or any previous visit.  Shoulder-R DG: No results found for this or any previous visit.  Shoulder-L DG: No results found for this or any previous visit.   Thoracic Imaging: Thoracic MR wo contrast: No results found for this or any previous visit.  Thoracic MR wo contrast: No results found for this or any previous visit.  Thoracic MR w/wo contrast: No results found for this or any previous visit.  Thoracic MR w contrast: No results  found for this or any previous visit.  Thoracic CT wo contrast: No results found for this or any previous visit.  Thoracic CT w/wo contrast: No results found for this or any previous visit.  Thoracic CT w/wo contrast: No results found for this or any previous visit.  Thoracic CT w contrast: No results found for this or any previous visit.  Thoracic DG 2-3 views: No results found for this or any previous visit.  Thoracic DG 4 views: No results found for this or any previous visit.  Thoracic DG: No results found for this or any previous visit.  Thoracic DG w/swimmers view: No results found for this or any previous visit.  Thoracic DG Myelogram views: No results found for this or any previous visit.  Thoracic DG Myelogram views: No results found for this or any previous visit.   Lumbosacral Imaging: Lumbar MR wo contrast: Results for orders placed during the hospital encounter of 11/18/22  MR LUMBAR SPINE WO CONTRAST  Narrative CLINICAL DATA:  Low back pain extending into both legs for 1-2 years.  EXAM: MRI LUMBAR SPINE WITHOUT CONTRAST  TECHNIQUE: Multiplanar, multisequence MR imaging of the lumbar spine was performed. No intravenous contrast was administered.  COMPARISON:  None Available.  FINDINGS: Segmentation: Transitional anatomy with sacralization of the L5 vertebral body.  Alignment:  Physiologic.  Vertebrae: No acute fracture, evidence of discitis, or aggressive bone lesion.  Conus medullaris and cauda equina: Conus extends to the L1 level. Conus and cauda equina appear normal.  Paraspinal and other soft tissues: No acute paraspinal abnormality.  Disc levels:  Disc spaces: Disc desiccation throughout the lumbar spine. No significant disc height loss.  T12-L1: No significant disc bulge. No neural foraminal stenosis. No central canal stenosis. Mild bilateral facet arthropathy.  L1-L2: No significant disc bulge. No neural foraminal stenosis.  No central canal stenosis. Mild bilateral facet arthropathy.  L2-L3: No significant disc bulge. No neural foraminal stenosis. No central canal stenosis. Mild-moderate bilateral facet arthropathy.  L3-L4: Mild broad-based disc bulge. Moderate bilateral facet arthropathy, right worse than left. Mild spinal stenosis. Mild right foraminal stenosis. No left foraminal stenosis.  L4-L5: Mild broad-based disc bulge. Moderate left and mild right facet arthropathy. No foraminal or central canal stenosis.  L5-S1: No significant disc bulge. No neural foraminal stenosis. No central canal stenosis.  IMPRESSION: 1. Mild lumbar spine spondylosis as described above. 2. No acute osseous injury of the  lumbar spine.   Electronically Signed By: Elige Ko M.D. On: 11/20/2022 08:46  Lumbar MR wo contrast: No results found for this or any previous visit.  Lumbar MR w/wo contrast: No results found for this or any previous visit.  Lumbar MR w/wo contrast: No results found for this or any previous visit.  Lumbar MR w contrast: No results found for this or any previous visit.  Lumbar CT wo contrast: No results found for this or any previous visit.  Lumbar CT w/wo contrast: No results found for this or any previous visit.  Lumbar CT w/wo contrast: No results found for this or any previous visit.  Lumbar CT w contrast: No results found for this or any previous visit.  Lumbar DG 1V: No results found for this or any previous visit.  Lumbar DG 1V (Clearing): No results found for this or any previous visit.  Lumbar DG 2-3V (Clearing): No results found for this or any previous visit.  Lumbar DG 2-3 views: No results found for this or any previous visit.  Lumbar DG (Complete) 4+V: No results found for this or any previous visit.        Lumbar DG F/E views: No results found for this or any previous visit.        Lumbar DG Bending views: No results found for this or any previous visit.         Lumbar DG Myelogram views: No results found for this or any previous visit.  Lumbar DG Myelogram: No results found for this or any previous visit.  Lumbar DG Myelogram: No results found for this or any previous visit.  Lumbar DG Myelogram: No results found for this or any previous visit.  Lumbar DG Myelogram Lumbosacral: No results found for this or any previous visit.  Lumbar DG Diskogram views: No results found for this or any previous visit.  Lumbar DG Diskogram views: No results found for this or any previous visit.  Lumbar DG Epidurogram OP: No results found for this or any previous visit.  Lumbar DG Epidurogram IP: No results found for this or any previous visit.   Sacroiliac Joint Imaging: Sacroiliac Joint DG: No results found for this or any previous visit.  Sacroiliac Joint MR w/wo contrast: No results found for this or any previous visit.  Sacroiliac Joint MR wo contrast: No results found for this or any previous visit.   Spine Imaging: Whole Spine DG Myelogram views: No results found for this or any previous visit.  Whole Spine MR Mets screen: No results found for this or any previous visit.  Whole Spine MR Mets screen: No results found for this or any previous visit.  Whole Spine MR w/wo: No results found for this or any previous visit.  MRA Spinal Canal w/ cm: No results found for this or any previous visit.  MRA Spinal Canal wo/ cm: No results found for this or any previous visit.  MRA Spinal Canal w/wo cm: No results found for this or any previous visit.  Spine Outside MR Films: No results found for this or any previous visit.  Spine Outside CT Films: No results found for this or any previous visit.  CT-Guided Biopsy: No results found for this or any previous visit.  CT-Guided Needle Placement: No results found for this or any previous visit.  DG Spine outside: No results found for this or any previous visit.  IR Spine outside: No results found for  this or any previous visit.  NM Spine outside: No results found for this or any previous visit.   Hip Imaging: Hip-R MR w contrast: No results found for this or any previous visit.  Hip-L MR w contrast: No results found for this or any previous visit.  Hip-R MR w/wo contrast: No results found for this or any previous visit.  Hip-L MR w/wo contrast: No results found for this or any previous visit.  Hip-R MR wo contrast: No results found for this or any previous visit.  Hip-L MR wo contrast: No results found for this or any previous visit.  Hip-R CT w contrast: No results found for this or any previous visit.  Hip-L CT w contrast: No results found for this or any previous visit.  Hip-R CT w/wo contrast: No results found for this or any previous visit.  Hip-L CT w/wo contrast: No results found for this or any previous visit.  Hip-R CT wo contrast: No results found for this or any previous visit.  Hip-L CT wo contrast: No results found for this or any previous visit.  Hip-R DG 2-3 views: No results found for this or any previous visit.  Hip-L DG 2-3 views: No results found for this or any previous visit.  Hip-R DG Arthrogram: No results found for this or any previous visit.  Hip-L DG Arthrogram: No results found for this or any previous visit.  Hip-B DG Bilateral: No results found for this or any previous visit.  Hip-B DG Bilateral (5V): No results found for this or any previous visit.   Knee Imaging: Knee-R MR w contrast: No results found for this or any previous visit.  Knee-L MR w/o contrast: No results found for this or any previous visit.  Knee-R MR w/wo contrast: No results found for this or any previous visit.  Knee-L MR w/wo contrast: No results found for this or any previous visit.  Knee-R MR wo contrast: No results found for this or any previous visit.  Knee-L MR wo contrast: No results found for this or any previous visit.  Knee-R CT w contrast: No  results found for this or any previous visit.  Knee-L CT w contrast: No results found for this or any previous visit.  Knee-R CT w/wo contrast: No results found for this or any previous visit.  Knee-L CT w/wo contrast: No results found for this or any previous visit.  Knee-R CT wo contrast: No results found for this or any previous visit.  Knee-L CT wo contrast: No results found for this or any previous visit.  Knee-R DG 1-2 views: No results found for this or any previous visit.  Knee-L DG 1-2 views: No results found for this or any previous visit.  Knee-R DG 3 views: No results found for this or any previous visit.  Knee-L DG 3 views: No results found for this or any previous visit.  Knee-R DG 4 views: No results found for this or any previous visit.  Knee-L DG 4 views: No results found for this or any previous visit.  Knee-R DG Arthrogram: No results found for this or any previous visit.  Knee-L DG Arthrogram: No results found for this or any previous visit.   Ankle Imaging: Ankle-R DG Complete: No results found for this or any previous visit.  Ankle-L DG Complete: No results found for this or any previous visit.   Foot Imaging: Foot-R DG Complete: No results found for this or any previous visit.  Foot-L DG Complete: No results found for this  or any previous visit.   Elbow Imaging: Elbow-R DG Complete: No results found for this or any previous visit.  Elbow-L DG Complete: No results found for this or any previous visit.   Wrist Imaging: Wrist-R DG Complete: No results found for this or any previous visit.  Wrist-L DG Complete: No results found for this or any previous visit.   Hand Imaging: Hand-R DG Complete: No results found for this or any previous visit.  Hand-L DG Complete: No results found for this or any previous visit.   Complexity Note: Imaging results reviewed.                         ROS  Cardiovascular: {Hx; Cardiovascular  History:210120525} Pulmonary or Respiratory: {Hx; Pumonary and/or Respiratory History:210120523} Neurological: {Hx; Neurological:210120504} Psychological-Psychiatric: {Hx; Psychological-Psychiatric History:210120512} Gastrointestinal: {Hx; Gastrointestinal:210120527} Genitourinary: {Hx; Genitourinary:210120506} Hematological: {Hx; Hematological:210120510} Endocrine: {Hx; Endocrine history:210120509} Rheumatologic: {Hx; Rheumatological:210120530} Musculoskeletal: {Hx; Musculoskeletal:210120528} Work History: {Hx; Work history:210120514}  Allergies  Kristie Olson is allergic to ibuprofen, nsaids, and percocet [oxycodone-acetaminophen].  Laboratory Chemistry Profile   Renal Lab Results  Component Value Date   BUN 16 06/27/2023   CREATININE 0.81 06/27/2023   GFRAA >60 07/01/2020   GFRNONAA >60 06/27/2023   PROTEINUR 100 (A) 02/14/2023     Electrolytes Lab Results  Component Value Date   NA 136 05/23/2023   K 3.7 05/23/2023   CL 102 05/23/2023   CALCIUM 9.4 05/23/2023     Hepatic Lab Results  Component Value Date   AST 17 04/28/2020   ALT 14 04/28/2020   ALBUMIN 4.3 04/28/2020   ALKPHOS 89 04/28/2020   LIPASE 69 (H) 04/28/2020     ID Lab Results  Component Value Date   HIV Non Reactive 09/03/2022   SARSCOV2NAA NEGATIVE 04/28/2020     Bone No results found for: "VD25OH", "VD125OH2TOT", "ZO1096EA5", "WU9811BJ4", "25OHVITD1", "25OHVITD2", "25OHVITD3", "TESTOFREE", "TESTOSTERONE"   Endocrine Lab Results  Component Value Date   GLUCOSE 186 (H) 05/23/2023   GLUCOSEU NEGATIVE 02/14/2023   HGBA1C 8.2 (H) 09/02/2022     Neuropathy Lab Results  Component Value Date   HGBA1C 8.2 (H) 09/02/2022   HIV Non Reactive 09/03/2022     CNS No results found for: "COLORCSF", "APPEARCSF", "RBCCOUNTCSF", "WBCCSF", "POLYSCSF", "LYMPHSCSF", "EOSCSF", "PROTEINCSF", "GLUCCSF", "JCVIRUS", "CSFOLI", "IGGCSF", "LABACHR", "ACETBL"   Inflammation (CRP: Acute  ESR: Chronic) Lab Results   Component Value Date   CRP 0.5 05/23/2023   ESRSEDRATE 25 05/23/2023     Rheumatology No results found for: "RF", "ANA", "LABURIC", "URICUR", "LYMEIGGIGMAB", "LYMEABIGMQN", "HLAB27"   Coagulation Lab Results  Component Value Date   INR 1.0 09/02/2022   LABPROT 13.0 09/02/2022   APTT 36 09/02/2022   PLT 419 (H) 05/23/2023     Cardiovascular Lab Results  Component Value Date   TROPONINI <0.03 11/26/2018   HGB 14.2 05/23/2023   HCT 43.0 05/23/2023     Screening Lab Results  Component Value Date   SARSCOV2NAA NEGATIVE 04/28/2020   HIV Non Reactive 09/03/2022     Cancer No results found for: "CEA", "CA125", "LABCA2"   Allergens No results found for: "ALMOND", "APPLE", "ASPARAGUS", "AVOCADO", "BANANA", "BARLEY", "BASIL", "BAYLEAF", "GREENBEAN", "LIMABEAN", "WHITEBEAN", "BEEFIGE", "REDBEET", "BLUEBERRY", "BROCCOLI", "CABBAGE", "MELON", "CARROT", "CASEIN", "CASHEWNUT", "CAULIFLOWER", "CELERY"     Note: Lab results reviewed.  PFSH  Drug: Kristie Olson  reports that she does not currently use drugs. Alcohol:  reports no history of alcohol use. Tobacco:  reports that she has been smoking cigarettes.  She has a 46 pack-year smoking history. She has never used smokeless tobacco. Medical:  has a past medical history of Asthma, Coronary artery disease, Diabetes mellitus without complication (HCC) (1990), Fibromyalgia, Hypertension, and PAD (peripheral artery disease) (HCC) (09/03/2022). Family: family history includes Breast cancer in her paternal aunt; Breast cancer (age of onset: 22) in her sister.  Past Surgical History:  Procedure Laterality Date   BREAST EXCISIONAL BIOPSY Left 1980's   neg   BREAST MASS EXCISION Left    age 44"s, benign   CESAREAN SECTION     CHOLECYSTECTOMY  2009   LOWER EXTREMITY ANGIOGRAPHY Left 09/03/2022   Procedure: Lower Extremity Angiography;  Surgeon: Annice Needy, MD;  Location: [facility];  Service: Cardiovascular;  Laterality: Left;    LOWER EXTREMITY ANGIOGRAPHY Right 09/08/2022   Procedure: Lower Extremity Angiography;  Surgeon: Annice Needy, MD;  Location: [facility];  Service: Cardiovascular;  Laterality: Right;   LOWER EXTREMITY ANGIOGRAPHY Left 01/18/2023   Procedure: Lower Extremity Angiography;  Surgeon: Renford Dills, MD;  Location: [facility];  Service: Cardiovascular;  Laterality: Left;   LOWER EXTREMITY ANGIOGRAPHY Left 02/14/2023   Procedure: Lower Extremity Angiography;  Surgeon: Annice Needy, MD;  Location: [facility];  Service: Cardiovascular;  Laterality: Left;   LOWER EXTREMITY ANGIOGRAPHY Left 06/27/2023   Procedure: Lower Extremity Angiography;  Surgeon: Annice Needy, MD;  Location: [facility];  Service: Cardiovascular;  Laterality: Left;   Active Ambulatory Problems    Diagnosis Date Noted   Slurred speech 07/27/2017   Breast abscess of female 03/29/2018   Bronchitis 11/26/2018   Acute pancreatitis 04/28/2020   Hypertension 10/25/2007   Diabetes mellitus with hyperglycemia (HCC)    Dehydration    Depression    Asthma 09/16/2020   Benign essential hypertension 07/31/2020   Cervical radiculopathy 08/14/2018   Cervical spondylosis without myelopathy 01/16/2021   Dizziness 02/22/2017   Hematuria 07/31/2020   Hyperlipidemia 07/19/2008   Long term current use of insulin (HCC) 11/01/2013   Peripheral neuropathy 09/16/2020   Proteinuria 07/31/2020   Right bundle branch block 09/18/2020   Skin macule or macular rash 09/18/2020   Transient ischemic attack 08/05/2017   Type 2 diabetes mellitus with diabetic neuropathy (HCC) 04/18/2013   Type 2 diabetes mellitus with hyperglycemia (HCC) 06/20/2018   Vulvar itching 09/18/2020   Pancreatitis 09/16/2020   Anxiety and depression 09/16/2020   Type II or unspecified type diabetes mellitus with renal manifestations, not stated as uncontrolled 07/31/2020   Type 2 diabetes mellitus (HCC) 09/16/2020   Disorder of bursae  of shoulder region 01/16/2021   PAD (peripheral artery disease) (HCC) 09/03/2022   Claudication of both lower extremities (HCC) 09/03/2022   Critical limb ischemia of both lower extremities (HCC)    Chronic pain syndrome 06/27/2023   Pharmacologic therapy 06/27/2023   Disorder of skeletal system 06/27/2023   Problems influencing health status 06/27/2023   Abnormal MRI, lumbar spine (11/20/2022) 06/27/2023   Resolved Ambulatory Problems    Diagnosis Date Noted   No Resolved Ambulatory Problems   Past Medical History:  Diagnosis Date   Coronary artery disease    Diabetes mellitus without complication (HCC) 1990   Fibromyalgia    Constitutional Exam  General appearance: Well nourished, well developed, and well hydrated. In no apparent acute distress There were no vitals filed for this visit. BMI Assessment: Estimated body mass index is 40.43 kg/m as calculated from the following:  Height as of 06/27/23: 5' (1.524 m).   Weight as of 08/03/23: 207 lb (93.9 kg).  BMI interpretation table: BMI level Category Range association with higher incidence of chronic pain  <18 kg/m2 Underweight   18.5-24.9 kg/m2 Ideal body weight   25-29.9 kg/m2 Overweight Increased incidence by 20%  30-34.9 kg/m2 Obese (Class I) Increased incidence by 68%  35-39.9 kg/m2 Severe obesity (Class II) Increased incidence by 136%  >40 kg/m2 Extreme obesity (Class III) Increased incidence by 254%   Patient's current BMI Ideal Body weight  There is no height or weight on file to calculate BMI. Patient weight not recorded   BMI Readings from Last 4 Encounters:  08/03/23 40.43 kg/m  06/27/23 40.43 kg/m  06/21/23 42.58 kg/m  06/03/23 42.58 kg/m   Wt Readings from Last 4 Encounters:  08/03/23 207 lb (93.9 kg)  06/27/23 207 lb (93.9 kg)  06/21/23 218 lb (98.9 kg)  06/03/23 218 lb 0.2 oz (98.9 kg)    Psych/Mental status: Alert, oriented x 3 (person, place, & time)       Eyes: PERLA Respiratory: No  evidence of acute respiratory distress  Assessment  Primary Diagnosis & Pertinent Problem List: There were no encounter diagnoses.  Visit Diagnosis (New problems to examiner): No diagnosis found. Plan of Care (Initial workup plan)  Note: Kristie Olson was reminded that as per protocol, today's visit has been an evaluation only. We have not taken over the patient's controlled substance management.  Problem-specific plan: Assessment and Plan            Lab Orders  No laboratory test(s) ordered today   Imaging Orders  No imaging studies ordered today   Referral Orders  No referral(s) requested today   Procedure Orders    No procedure(s) ordered today   Pharmacotherapy (current): Medications ordered:  No orders of the defined types were placed in this encounter.  Medications administered during this visit: Greer Wainright. Olson had no medications administered during this visit.   Analgesic Pharmacotherapy:  Opioid Analgesics: For patients currently taking or requesting to take opioid analgesics, in accordance with [facility] Guidelines, we will assess their risks and indications for the use of these substances. After completing our evaluation, we may offer recommendations, but we no longer take patients for medication management. The prescribing physician will ultimately decide, based on his/her training and level of comfort whether to adopt any of the recommendations, including whether or not to prescribe such medicines.  Membrane stabilizer: To be determined at a later time  Muscle relaxant: To be determined at a later time  NSAID: To be determined at a later time  Other analgesic(s): To be determined at a later time   Interventional management options: Kristie Olson was informed that there is no guarantee that she would be a candidate for interventional therapies. The decision will be based on the results of diagnostic studies, as well as Kristie Olson's risk profile.   Procedure(s) under consideration:  Pending results of ordered studies      Interventional Therapies  Risk Factors  Considerations  Medical Comorbidities:     Planned  Pending:      Under consideration:   Pending   Completed:   None at this time   Therapeutic  Palliative (PRN) options:   None established   Completed by other providers:   None reported       Provider-requested follow-up: No follow-ups on file.  Future Appointments  Date Time Provider Department Center  12/12/2023 11:00 AM Delano Metz, MD [facility] None  05/11/2024  3:00 PM [facility] None  05/11/2024  4:00 PM Georgiana Spinner, NP [facility] None    Duration of encounter: *** minutes.  Total time on encounter, as per AMA guidelines included both the face-to-face and non-face-to-face time personally spent by the physician and/or other qualified health care professional(s) on the day of the encounter (includes time in activities that require the physician or other qualified health care professional and does not include time in activities normally performed by clinical staff). Physician's time may include the following activities when performed: Preparing to see the patient (e.g., pre-charting review of records, searching for previously ordered imaging, lab work, and nerve conduction tests) Review of prior analgesic pharmacotherapies. Reviewing PMP Interpreting ordered tests (e.g., lab work, imaging, nerve conduction tests) Performing post-procedure evaluations, including interpretation of diagnostic procedures Obtaining and/or reviewing separately obtained history Performing a medically appropriate examination and/or evaluation Counseling and educating the patient/family/caregiver Ordering medications, tests, or procedures Referring and communicating with other health care professionals (when not separately reported) Documenting clinical information in the electronic or other health  record Independently interpreting results (not separately reported) and communicating results to the patient/ family/caregiver Care coordination (not separately reported)  Note by: Oswaldo Done, MD (AI and TTS technology used. I apologize for any typographical errors that were not detected and corrected.) Date: 12/12/2023; Time: 6:17 PM

## 2023-12-12 ENCOUNTER — Ambulatory Visit
Admission: RE | Admit: 2023-12-12 | Discharge: 2023-12-12 | Disposition: A | Discharge status: Home / Self Care | Payer: 59 | Source: Ambulatory Visit | Attending: Pain Medicine | Admitting: Pain Medicine

## 2023-12-12 ENCOUNTER — Encounter: Payer: Self-pay | Admitting: Pain Medicine

## 2023-12-12 ENCOUNTER — Ambulatory Visit: Payer: 59 | Admitting: Pain Medicine

## 2023-12-12 ENCOUNTER — Other Ambulatory Visit
Admission: RE | Admit: 2023-12-12 | Discharge: 2023-12-12 | Disposition: A | Discharge status: Home / Self Care | Payer: 59 | Source: Ambulatory Visit | Attending: Pain Medicine | Admitting: Pain Medicine

## 2023-12-12 VITALS — BP 159/93 | HR 93 | Temp 97.2°F | Resp 16 | Ht 60.0 in | Wt 207.0 lb

## 2023-12-12 DIAGNOSIS — I739 Peripheral vascular disease, unspecified: Secondary | ICD-10-CM

## 2023-12-12 DIAGNOSIS — E1140 Type 2 diabetes mellitus with diabetic neuropathy, unspecified: Secondary | ICD-10-CM | POA: Insufficient documentation

## 2023-12-12 DIAGNOSIS — M79671 Pain in right foot: Secondary | ICD-10-CM | POA: Insufficient documentation

## 2023-12-12 DIAGNOSIS — M545 Low back pain, unspecified: Secondary | ICD-10-CM | POA: Insufficient documentation

## 2023-12-12 DIAGNOSIS — M25552 Pain in left hip: Secondary | ICD-10-CM

## 2023-12-12 DIAGNOSIS — M5459 Other low back pain: Secondary | ICD-10-CM | POA: Insufficient documentation

## 2023-12-12 DIAGNOSIS — I70209 Unspecified atherosclerosis of native arteries of extremities, unspecified extremity: Secondary | ICD-10-CM | POA: Insufficient documentation

## 2023-12-12 DIAGNOSIS — M47817 Spondylosis without myelopathy or radiculopathy, lumbosacral region: Secondary | ICD-10-CM | POA: Insufficient documentation

## 2023-12-12 DIAGNOSIS — M5416 Radiculopathy, lumbar region: Secondary | ICD-10-CM | POA: Diagnosis present

## 2023-12-12 DIAGNOSIS — M25551 Pain in right hip: Secondary | ICD-10-CM | POA: Insufficient documentation

## 2023-12-12 DIAGNOSIS — R937 Abnormal findings on diagnostic imaging of other parts of musculoskeletal system: Secondary | ICD-10-CM | POA: Insufficient documentation

## 2023-12-12 DIAGNOSIS — M47816 Spondylosis without myelopathy or radiculopathy, lumbar region: Secondary | ICD-10-CM | POA: Insufficient documentation

## 2023-12-12 DIAGNOSIS — R52 Pain, unspecified: Secondary | ICD-10-CM | POA: Insufficient documentation

## 2023-12-12 DIAGNOSIS — Z789 Other specified health status: Secondary | ICD-10-CM | POA: Insufficient documentation

## 2023-12-12 DIAGNOSIS — Z6841 Body Mass Index (BMI) 40.0 and over, adult: Secondary | ICD-10-CM

## 2023-12-12 DIAGNOSIS — Z7901 Long term (current) use of anticoagulants: Secondary | ICD-10-CM | POA: Insufficient documentation

## 2023-12-12 DIAGNOSIS — M79672 Pain in left foot: Secondary | ICD-10-CM | POA: Insufficient documentation

## 2023-12-12 DIAGNOSIS — Z794 Long term (current) use of insulin: Secondary | ICD-10-CM

## 2023-12-12 DIAGNOSIS — G894 Chronic pain syndrome: Secondary | ICD-10-CM | POA: Insufficient documentation

## 2023-12-12 DIAGNOSIS — Z79899 Other long term (current) drug therapy: Secondary | ICD-10-CM

## 2023-12-12 DIAGNOSIS — G8929 Other chronic pain: Secondary | ICD-10-CM | POA: Insufficient documentation

## 2023-12-12 DIAGNOSIS — M899 Disorder of bone, unspecified: Secondary | ICD-10-CM | POA: Insufficient documentation

## 2023-12-12 DIAGNOSIS — E559 Vitamin D deficiency, unspecified: Secondary | ICD-10-CM | POA: Insufficient documentation

## 2023-12-12 DIAGNOSIS — E66813 Obesity, class 3: Secondary | ICD-10-CM | POA: Insufficient documentation

## 2023-12-12 LAB — COMPREHENSIVE METABOLIC PANEL
ALT: 14 U/L (ref 0–44)
AST: 20 U/L (ref 15–41)
Albumin: 4.1 g/dL (ref 3.5–5.0)
Alkaline Phosphatase: 81 U/L (ref 38–126)
Anion gap: 11 (ref 5–15)
BUN: 13 mg/dL (ref 8–23)
CO2: 28 mmol/L (ref 22–32)
Calcium: 9.8 mg/dL (ref 8.9–10.3)
Chloride: 101 mmol/L (ref 98–111)
Creatinine, Ser: 0.72 mg/dL (ref 0.44–1.00)
GFR, Estimated: >60 mL/min (ref >60)
Glucose, Bld: 76 mg/dL (ref 70–99)
Potassium: 3.5 mmol/L (ref 3.5–5.1)
Sodium: 140 mmol/L (ref 135–145)
Total Bilirubin: 0.3 mg/dL (ref 0.0–1.2)
Total Protein: 7.8 g/dL (ref 6.5–8.1)

## 2023-12-12 LAB — C-REACTIVE PROTEIN: CRP: 0.5 mg/dL (ref <1.0)

## 2023-12-12 LAB — MAGNESIUM: Magnesium: 2 mg/dL (ref 1.7–2.4)

## 2023-12-12 LAB — VITAMIN B12: Vitamin B-12: 151 pg/mL — ABNORMAL LOW (ref 180–914)

## 2023-12-12 LAB — VITAMIN D 25 HYDROXY (VIT D DEFICIENCY, FRACTURES): Vit D, 25-Hydroxy: 13.48 ng/mL — ABNORMAL LOW (ref 30–100)

## 2023-12-12 LAB — SEDIMENTATION RATE: Sed Rate: 28 mm/h (ref 0–30)

## 2023-12-12 NOTE — Patient Instructions (Addendum)
______________________________________________________________________    New Patients  Welcome to [facility].   Initial Visit The first or initial visit consists of an evaluation only.   Interventional pain management.  We offer therapies other than opioid controlled substances to manage chronic pain. These include, but are not limited to, diagnostic, therapeutic, and palliative specialized injection therapies (i.e.: Epidural Steroids, Facet Blocks, etc.). We specialize in a variety of nerve blocks as well as radiofrequency treatments. We offer pain implant evaluations and trials, as well as follow up management. In addition we also provide a variety joint injections, including Viscosupplementation (AKA: Gel Therapy).  Prescription Pain Medication. We specialize in alternatives to opioids. We can provide evaluations and recommendations for/of pharmacologic therapies based on CDC Guidelines.  We no longer take patients for long-term medication management. We will not be taking over your pain medications.  ______________________________________________________________________      ______________________________________________________________________    Patient Information update  To: All of our patients.  Re: Name change.  It has been made official that our current name, "[facility]"   will soon be changed to "[facility]".   The purpose of this change is to eliminate any confusion created by the concept of our practice being a "Medication Management Pain Clinic". In the past this has led to the misconception that we treat pain primarily by the use of prescription medications.  Nothing can be farther from the truth.   Understanding PAIN MANAGEMENT: To further understand what our practice does, you first have to  understand that "Pain Management" is a subspecialty that requires additional training once a physician has completed their specialty training, which can be in either Anesthesia, Neurology, Psychiatry, or Physical Medicine and Rehabilitation (PMR). Each one of these contributes to the final approach taken by each physician to the management of their patient's pain. To be a "Pain Management Specialist" you must have first completed one of the specialty trainings below.  Anesthesiologists - trained in clinical pharmacology and interventional techniques such as nerve blockade and regional as well as central neuroanatomy. They are trained to block pain before, during, and after surgical interventions.  Neurologists - trained in the diagnosis and pharmacological treatment of complex neurological conditions, such as Multiple Sclerosis, Parkinson's, spinal cord injuries, and other systemic conditions that may be associated with symptoms that may include but are not limited to pain. They tend to rely primarily on the treatment of chronic pain using prescription medications.  Psychiatrist - trained in conditions affecting the psychosocial wellbeing of patients including but not limited to depression, anxiety, schizophrenia, personality disorders, addiction, and other substance use disorders that may be associated with chronic pain. They tend to rely primarily on the treatment of chronic pain using prescription medications.   Physical Medicine and Rehabilitation (PMR) physicians, also known as physiatrists - trained to treat a wide variety of medical conditions affecting the brain, spinal cord, nerves, bones, joints, ligaments, muscles, and tendons. Their training is primarily aimed at treating patients that have suffered injuries that have caused severe physical impairment. Their training is primarily aimed at the physical therapy and rehabilitation of those patients. They may also work alongside orthopedic surgeons  or neurosurgeons using their expertise in assisting surgical patients to recover after their surgeries.  INTERVENTIONAL PAIN MANAGEMENT is sub-subspecialty of Pain Management.  Our physicians are Board-certified in Anesthesia, Pain Management, and Interventional Pain Management.  This meaning that  not only have they been trained and Board-certified in their specialty of Anesthesia, and subspecialty of Pain Management, but they have also received further training in the sub-subspecialty of Interventional Pain Management, in order to become Board-certified as INTERVENTIONAL PAIN MANAGEMENT SPECIALIST.    Mission: Our goal is to use our skills in  INTERVENTIONAL PAIN MANAGEMENT as alternatives to the chronic use of prescription opioid medications for the treatment of pain. To make this more clear, we have changed our name to reflect what we do and offer. We will continue to offer medication management assessment and recommendations, but we will not be taking over any patient's medication management.  ______________________________________________________________________      ______________________________________________________________________    Patient information on: Body mass index (BMI) and Weight Management  Dear Kristie Olson you are receiving this information because your weight may be adversely affecting your health.   Your current Estimated body mass index is 40.43 kg/m as calculated from the following:   Height as of this encounter: 5' (1.524 m).   Weight as of this encounter: 207 lb (93.9 kg).  We recommend you talk to your primary care physician about providing or referring you to a supervised weight management program.  Here is some information about weight and the body mass index (BMI) classification:  BMI is a measure of obesity that's calculated by dividing a person's weight in kilograms by their height in meters squared. A person can use an online calculator to determine their  BMI. Body mass index (BMI) is a common tool for deciding whether a person has an appropriate body weight.  It measures a person's weight in relation to their height.  According to the [facility] of health (NIH): A BMI of less than 18.5 means that a person is underweight. A BMI of between 18.5 and 24.9 is ideal. A BMI of between 25 and 29.9 is overweight. A BMI over 30 indicates obesity.  Body Mass Index (BMI) Classification BMI level (kg/m2) Category Associated incidence of chronic pain  <18  Underweight   18.5-24.9 Ideal body weight   25-29.9 Overweight  20%  30-34.9 Obese (Class I)  68%  35-39.9 Severe obesity (Class II)  136%  >40 Extreme obesity (Class III)  254%    Morbidly Obese Classification: You will be considered to be "Morbidly Obese" if your BMI is above 30 and you have one or more of the following conditions caused or associated to obesity: 1.    Type 2 Diabetes (Leading to cardiovascular diseases (CVD), stroke, peripheral vascular diseases (PVD), retinopathy, nephropathy, and neuropathy) 2.    Cardiovascular Disease (High Blood Pressure; Congestive Heart Failure; High Cholesterol; Coronary Artery Disease; Angina; Arrhythmias, Dysrhythmias, or Heart Attacks) 3.    Breathing problems (Asthma; obesity-hypoventilation syndrome; obstructive sleep apnea; chronic inflammatory airway disease; reactive airway disease; or shortness of breath) 4.    Chronic kidney disease 5.    Liver disease (nonalcoholic fatty liver disease) 6.    High blood pressure 7.    Acid reflux (gastroesophageal reflux disease; heartburn) 8.    Osteoarthritis (OA) (affecting the hip(s), the knee(s) and/or the lower back) (usually requiring knee and/or hip replacements, as well as back surgeries) 9.    Low back pain (Lumbar Facet Syndrome; and/or Degenerative Disc Disease) 10.  Hip pain (Osteoarthritis of hip) (For every 1 lbs of added body weight, there is a 2 lbs increase in pressure inside of each hip  articulation. 1:2 mechanical relationship) 11.  Knee pain (Osteoarthritis of knee) (For  every 1 lbs of added body weight, there is a 4 lbs increase in pressure inside of each knee articulation. 1:4 mechanical relationship) (patients with a BMI>30 kg/m2 were 6.8 times more likely to develop knee OA than normal-weight individuals) 12.  Cancer: Epidemiological studies have shown that obesity is a risk factor for: post-menopausal breast cancer; cancers of the endometrium, colon and kidney cancer; malignant adenomas of the esophagus. Obese subjects have an approximately 1.5-3.5-fold increased risk of developing these cancers compared with normal-weight subjects, and it has been estimated that between 15 and 45% of these cancers can be attributed to overweight. More recent studies suggest that obesity may also increase the risk of other types of cancer, including pancreatic, hepatic and gallbladder cancer. (Ref: Obesity and cancer. Pischon T, Nthlings U, [employer] H. Proc Nutr Soc. 2008 May;67(2):128-45. doi: 10.1017/S0029665108006976.) The International Agency for Research on Cancer (IARC) has identified 13 cancers associated with overweight and obesity: meningioma, multiple myeloma, adenocarcinoma of the esophagus, and cancers of the thyroid, postmenopausal breast cancer, gallbladder, stomach, liver, pancreas, kidney, ovaries, uterus, colon and rectal (colorectal) cancers. 55 percent of all cancers diagnosed in women and 24 percent of those diagnosed in men are associated with overweight and obesity.  Recommendation: If you have any of the above conditions it is urgent that you take a step back and concentrate in losing weight. Dedicate 100% of your efforts on this task. Nothing else will improve your health more than bringing your weight down and your BMI to less than 30.   Nutritionist and/or supervised weight-management program: We are aware that most chronic pain patients are unable to exercise secondary to  their pain. For this reason, you must rely on proper nutrition and diet in order to lose the weight. We recommend you talk to a nutritionist.   Bariatric surgery: A person might be considered a candidate for bariatric surgery if they meet one of the following BMI criteria:  BMI of 40 or higher: This is considered extreme obesity (Class III). BMI of 35-39.9: This is considered obesity, and the person might also have a serious weight-related health condition, such as high blood pressure, type 2 diabetes, or severe sleep apnea  BMI of 30-34.9: This might be considered if the person has serious weight-related health problems and hasn't had substantial weight loss or improvement in co-morbidities through other methods   On your own: A realistic goal is to lose 10% of your body weight over a period of 12 months.  If over a period of six (6) months you have unsuccessfully tried to lose weight, then it is time for you to seek professional help and to enter a medically supervised weight management program, and/or undergo bariatric surgery.   Pain management considerations and possible limitations:  1.    Pharmacological Problems: Be advised that the use of opioid analgesics (oxycodone; hydrocodone; morphine; methadone; codeine; and all of their derivatives) have been associated with decreased metabolism and weight gain.  For this reason, should we see that you are unable to lose weight while taking these medications, it may become necessary for [place] to taper down and indefinitely discontinue them.  2.    Technical Problems: The incidence of successful interventional therapies decreases as the patient's BMI increases. It is much more difficult to accomplish a safe and effective interventional therapy on a patient with a BMI above 35. 3.    Radiation Exposure Problems: The x-rays machine, used to accomplish injection therapies, will automatically increase their x-ray output in order  to capture an appropriate bone  image. This means that radiation exposure increases exponentially with the patient's BMI. (The higher the BMI, the higher the radiation exposure.) Although the level of radiation used at a given time is still safe to the patient, it is not for the physician and/or assisting staff. Unfortunately, radiation exposure is accumulative. Because physicians and the staff have to do procedures and be exposed on a daily basis, this can result in health problems such as cancer and radiation burns. Radiation exposure to the staff is monitored by the radiation batches that they wear. The exposure levels are reported back to the staff on a quarterly basis. Depending on levels of exposure, physicians and staff may be obligated by law to decrease this exposure. This means that they have the right and obligation to refuse providing therapies where they may be overexposed to radiation. For this reason, physicians may decline to offer therapies such as radiofrequency ablation or implants to patients with a BMI above 40. 4.    Current Trends: Be advised that the current trend is to no longer offer certain therapies to patients with a BMI equal to, or above 35, due to increase perioperative risks, increased technical procedural difficulties, and excessive radiation exposure to healthcare personnel.  Last updated: 08/08/2023 ______________________________________________________________________

## 2023-12-12 NOTE — Progress Notes (Signed)
Safety precautions to be maintained throughout the outpatient stay will include: orient to surroundings, keep bed in low position, maintain call bell within reach at all times, provide assistance with transfer out of bed and ambulation.

## 2023-12-15 ENCOUNTER — Ambulatory Visit: Payer: 59 | Attending: Pain Medicine

## 2023-12-15 DIAGNOSIS — M25552 Pain in left hip: Secondary | ICD-10-CM | POA: Diagnosis present

## 2023-12-15 DIAGNOSIS — R262 Difficulty in walking, not elsewhere classified: Secondary | ICD-10-CM | POA: Insufficient documentation

## 2023-12-15 DIAGNOSIS — M25551 Pain in right hip: Secondary | ICD-10-CM | POA: Insufficient documentation

## 2023-12-15 DIAGNOSIS — M6281 Muscle weakness (generalized): Secondary | ICD-10-CM | POA: Insufficient documentation

## 2023-12-15 DIAGNOSIS — M545 Low back pain, unspecified: Secondary | ICD-10-CM | POA: Diagnosis present

## 2023-12-15 DIAGNOSIS — G8929 Other chronic pain: Secondary | ICD-10-CM | POA: Diagnosis present

## 2023-12-15 LAB — COMPLIANCE DRUG ANALYSIS, UR

## 2023-12-15 NOTE — Therapy (Signed)
OUTPATIENT PHYSICAL THERAPY THORACOLUMBAR EVALUATION   Patient Name: Kristie Olson MRN: [id number] DOB:1956/05/22, 68 y.o., female Today's Date: 12/15/2023  END OF SESSION:  PT End of Session - 12/15/23 1519     Visit Number 1    Number of Visits 17    Date for PT Re-Evaluation 02/09/24    PT Start Time 1515    PT Stop Time 1600    PT Time Calculation (min) 45 min    Activity Tolerance Patient tolerated treatment well    Behavior During Therapy [facility] for tasks assessed/performed             Past Medical History:  Diagnosis Date   Asthma    Coronary artery disease    Diabetes mellitus without complication (HCC) 1990   Fibromyalgia    Hypertension    PAD (peripheral artery disease) (HCC) 09/03/2022   Past Surgical History:  Procedure Laterality Date   BREAST EXCISIONAL BIOPSY Left 1980's   neg   BREAST MASS EXCISION Left    age 68"s, benign   CESAREAN SECTION     CHOLECYSTECTOMY  2009   LOWER EXTREMITY ANGIOGRAPHY Left 09/03/2022   Procedure: Lower Extremity Angiography;  Surgeon: Annice Needy, MD;  Location: [facility] INVASIVE CV [facility];  Service: Cardiovascular;  Laterality: Left;   LOWER EXTREMITY ANGIOGRAPHY Right 09/08/2022   Procedure: Lower Extremity Angiography;  Surgeon: Annice Needy, MD;  Location: [facility] INV[facility] CV [facility];  Service: Cardiovascular;  Laterality: Right;   LOWER EXTREMITY ANGIOGRAPHY Left 01/18/2023   Procedure: Lower Extremity Angiography;  Surgeon: Renford Dills, MD;  Location: [facility] INV[facility] CV [facility];  Service: Cardiovascular;  Laterality: Left;   LOWER EXTREMITY ANGIOGRAPHY Left 02/14/2023   Procedure: Lower Extremity Angiography;  Surgeon: Annice Needy, MD;  Location: [facility] IN[facility];  Service: Cardiovascular;  Laterality: Left;   LOWER EXTREMITY ANGIOGRAPHY Left 06/27/2023   Procedure: Lower Extremity Angiography;  Surgeon: Annice Needy, MD;  Location: [facility];  Service: Cardiovascular;  Laterality: Left;   Patient Active Problem  List   Diagnosis Date Noted   Lumbar facet arthropathy 12/12/2023   Lumbar facet joint pain 12/12/2023   Osteoarthritis of facet joint of lumbar spine 12/12/2023   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   Spondylosis without myelopathy or radiculopathy, lumbosacral region 12/12/2023   Chronic anticoagulation (Plavix) 12/12/2023   Atherosclerotic peripheral vascular disease (HCC) 12/12/2023   Class 3 severe obesity with body mass index (BMI) of 40.0 to 44.9 in adult [facility]) 12/12/2023   Chronic hip pain (2ry area of Pain) (Bilateral) 12/12/2023   Chronic feet pain (3ry area of Pain) (Bilateral) 12/12/2023   Burning pain 12/12/2023   Chronic painful diabetic neuropathy (HCC) 12/12/2023   Chronic low back pain (1ry area of Pain) (Bilateral) w/o sciatica 12/12/2023   Chronic pain syndrome 06/27/2023   Pharmacologic therapy 06/27/2023   Disorder of skeletal system 06/27/2023   Problems influencing health status 06/27/2023   Abnormal MRI, lumbar spine (11/20/2022) 06/27/2023   PAD (peripheral artery disease) (HCC) 09/03/2022   Claudication of lower extremities (Bilateral) (HCC) 09/03/2022   Critical limb ischemia of lower extremities (Bilateral) (HCC)    Cervical spondylosis without myelopathy 01/16/2021   Disorder of bursae of shoulder region 01/16/2021   Right bundle branch block 09/18/2020   Skin macule or macular rash 09/18/2020   Vulvar itching 09/18/2020   Asthma 09/16/2020   Peripheral neuropathy 09/16/2020   Pancreatitis 09/16/2020   Anxiety and depression 09/16/2020   Type 2  diabetes mellitus (HCC) 09/16/2020   Benign essential hypertension 07/31/2020   Hematuria 07/31/2020   Proteinuria 07/31/2020   Type II or unspecified type diabetes mellitus with renal manifestations, not stated as uncontrolled 07/31/2020   Acute pancreatitis 04/28/2020   Diabetes mellitus with hyperglycemia (HCC)    Dehydration    Depression    Bronchitis 11/26/2018   Cervical radiculopathy  08/14/2018   Type 2 diabetes mellitus with hyperglycemia (HCC) 06/20/2018   Breast abscess of female 03/29/2018   Transient ischemic attack 08/05/2017   Slurred speech 07/27/2017   Dizziness 02/22/2017   Long term current use of insulin (HCC) 11/01/2013   Type 2 diabetes mellitus with diabetic neuropathy (HCC) 04/18/2013   Hyperlipidemia 07/19/2008   Hypertension 10/25/2007    PCP: Lorin Picket  [facility]  REFERRING PROVIDER: Delano Metz, MD  REFERRING DIAG: M25.551,M25.552,G89.29 (ICD-10-CM) - Chronic hip pain, bilateral  Rationale for Evaluation and Treatment: Rehabilitation  THERAPY DIAG:  Pain in right hip  Pain in left hip  Chronic bilateral low back pain without sciatica  Muscle weakness (generalized)  Difficulty in walking, not elsewhere classified  ONSET DATE: B hip pain and lower back pain chronic for many years  SUBJECTIVE:                                                                                                                                                                                           SUBJECTIVE STATEMENT: Pt is a 68 y.o. female referred to OPPT for B Hip pain.  PERTINENT HISTORY:  Pt reports B LBP where pain begins in lower back and refers into her groin. Sometimes happens bilaterally but R side is more painful than L side. Has history of LE neuropathy from feet to her knees. Pain is stiff and tight bilaterally. Pain feels deep. Pain begins in lower back then refers to hips. Laying down improves LBP  but hurts her hips. Standing aggravates hips and lower back after about 5 minutes. Pain worst in the morning and improves with motion. Has hx of ESI's in lower back but did not provide relief. Worst pain 8-9/10 NPS on average. Best pain 4/10 NPS. Cold weather makes her pain worse. Many near falls but no falls. Hurts with any standing and walking tasks. B hip pain worsens with laying down.  PAIN:  Are you having pain? Yes: NPRS scale:  5/10 NPS Pain location: across lower back, central lower back, B hips into groins Pain description: tight, achey Aggravating factors: laying down, sitting, standing, walking Relieving factors: N/A  PRECAUTIONS: Fall  RED FLAGS: None   WEIGHT BEARING RESTRICTIONS: No  FALLS:  Has patient fallen  in last 6 months? No  LIVING ENVIRONMENT: Lives with: lives with their family and lives with their spouse Lives in: House/apartment Stairs: Yes: External: 5 steps; bilateral but cannot reach both Has following equipment at home: Quad cane small base, Walker - 4 wheeled, and bed side commode  OCCUPATION: Disability/retired  PLOF: Independent  PATIENT GOALS: Be able to more active and independent.  NEXT MD VISIT: 01/12/24  OBJECTIVE:  Note: Objective measures were completed at Evaluation unless otherwise noted.  DIAGNOSTIC FINDINGS:  Pending MRI in EMR. Will post results when available.   PATIENT SURVEYS:  Modified Oswestry 74%   COGNITION: Overall cognitive status: Within functional limits for tasks assessed     SENSATION: Light touch: WFL. Impaired at feet due to neuropathy.  POSTURE: rounded shoulders, forward head, decreased lumbar lordosis, and increased thoracic kyphosis  PALPATION: TTP at R sided SIJ, lumbar paraspinals L5-S1 and upper gluteals on R and L. TTP at bilateral greater trochanters.   LUMBAR ROM:   AROM eval  Flexion 20%  Extension 10%*  Right lateral flexion 50%*  Left lateral flexion 50%*  Right rotation 80%  Left rotation 80%   (Blank rows = not tested)  LOWER EXTREMITY ROM:     Active  Right eval Left eval  Hip flexion 75 75  Hip extension    Hip abduction    Hip adduction    Hip internal rotation 25* 20*  Hip external rotation 20 25*  Knee flexion    Knee extension    Ankle dorsiflexion    Ankle plantarflexion    Ankle inversion    Ankle eversion     (Blank rows = not tested)  LOWER EXTREMITY MMT:    MMT Right eval Left eval   Hip flexion 4 4  Hip extension    Hip abduction 3 3  Hip adduction    Hip internal rotation 4* 4*  Hip external rotation 4* 4*  Knee flexion 4 4  Knee extension 4 4  Ankle dorsiflexion 3+ 3+  Ankle plantarflexion    Ankle inversion    Ankle eversion     (Blank rows = not tested)  LUMBAR SPECIAL TESTS:  FABER test: Negative FADDIR: Positive bilaterally  HIP/SIJ SPECIAL TESTS: Sacral thrust: positive Hip scour: positive R and L Hip distraction: relief on LLE but not RLE.  FUNCTIONAL TESTS:  Timed up and go (TUG): 38.5 seconds   5xSTS: deferred to next session  JOINT MOBILITY: Femoroacetabular AP hypomobile with concordant pain sign bilaterally.   GAIT: Distance walked: 10' Assistive device utilized: Counselling psychologist Level of assistance: CGA Comments: Decreased step lengths and R sided antalgic gait. Limited hip extension bialterally with limited step through gait.   TREATMENT DATE: 12/15/23  Eval only today                                                                                                                            PATIENT EDUCATION:  Education details: POC, prognosis Person educated: Patient Education method: Explanation Education comprehension: verbalized understanding  HOME EXERCISE PROGRAM: Deferred for next session  ASSESSMENT:  CLINICAL IMPRESSION: Patient is a 68 y.o. female who was seen today for physical therapy evaluation and treatment for chronic B hip pain. Pt presents with limitations and impairments in lumbar AROM in all planes and concordant B hip and lower back pain with hip special testing. Pt also presents with limitations in AROM in hips with rotation and flexion with concordant hip and back pain with reports of painful joint crepitus and deep pain indicative of hip pathology likely OA as contributing pain. Pt also presents with significantly global LE weakness especially proximally at hips laterally with abnormal TUG time placing  pt at a high risk for falls. Pt's impairments are leading to inability to perform ADL's independently, limiting household and community ambulation ADL's, and difficulty with sleep. Pt will benefit from skilled PT services to address these deficits and maximize independence with functional mobility and reduce falls risk.   OBJECTIVE IMPAIRMENTS: Abnormal gait, decreased activity tolerance, decreased mobility, difficulty walking, decreased ROM, decreased strength, hypomobility, postural dysfunction, obesity, and pain.   ACTIVITY LIMITATIONS: carrying, lifting, bending, sitting, standing, squatting, sleeping, stairs, transfers, bed mobility, bathing, dressing, hygiene/grooming, and locomotion level  PARTICIPATION LIMITATIONS: cleaning, laundry, and community activity  PERSONAL FACTORS: Age, Behavior pattern, Fitness, Past/current experiences, Time since onset of injury/illness/exacerbation, and 3+ comorbidities: DM, Fibromyalgia, HTN, Peripheral Neuropathy, PAD, depression/anxiety, HLD  are also affecting patient's functional outcome.   REHAB POTENTIAL: Poor Chronicity of symptoms, significant PMH  CLINICAL DECISION MAKING: Evolving/moderate complexity  EVALUATION COMPLEXITY: Moderate   GOALS: Goals reviewed with patient? No  SHORT TERM GOALS: Target date: 01/12/24  Pt will be independent with HEP to improve lumbar and hip AROM and LE strength. Baseline: 12/15/23: provide at next session Goal status: INITIAL  LONG TERM GOALS: Target date: 02/09/24  Pt will improve mODI by at least 13 points to demonstrate clinically significant reduction in disability due to her LBP.  Baseline: 12/15/23: 74% Goal status: INITIAL  2.  Pt will improve TUG to 20 seconds or less to demonstrate improved independence in transfers and reduced falls risk with household and community ADL's. Baseline: 12/15/23: 38.5 seconds Goal status: INITIAL  3.  Pt will improve 5xSTS to aged matched norms of 11.4 seconds or  less to demonstrate clinically significant improvement in leg strength.  Baseline: 12/15/23: deferred to next session Goal status: INITIAL  4.  Pt will report worst pain as a 6/10 NPS or less to demonstrate clinically significant reduction in hip and lower back pain.  Baseline: 12/15/23: ranges from 8-10/10 at worst Goal status: INITIAL  5.  Pt will improve R/L hip flexion, IR, and ER by at least 5 degrees in all planes to assist in reduction of joint related pain. Baseline: 12/15/23:  Hip flexion 75 75  Hip extension    Hip abduction    Hip adduction    Hip internal rotation 25* 20*  Hip external rotation 20 25*    PLAN:  PT FREQUENCY: 1-2x/week  PT DURATION: 8 weeks  PLANNED INTERVENTIONS: 97164- PT Re-evaluation, 97110-Therapeutic exercises, 97530- Therapeutic activity, 97112- Neuromuscular re-education, 97535- Self Care, 16109- Manual therapy, [id number]- Gait training, 97014- Electrical stimulation (unattended), [id number]- Electrical stimulation (manual), [id number]- Traction (mechanical), Patient/Family education, Balance training, Stair training, Dry Needling, Joint mobilization, Joint manipulation, Spinal manipulation, Spinal mobilization, DME instructions, Cryotherapy, and Moist heat.  PLAN FOR NEXT SESSION: 5xSTS, hip extension AROM,  hip PROM, produce HEP   Delphia Grates. Fairly IV, PT, DPT Physical Therapist- [place]  [facility]  12/15/2023, 6:02 PM

## 2023-12-19 ENCOUNTER — Ambulatory Visit: Payer: 59 | Attending: Pain Medicine

## 2023-12-19 DIAGNOSIS — M545 Low back pain, unspecified: Secondary | ICD-10-CM | POA: Insufficient documentation

## 2023-12-19 DIAGNOSIS — G8929 Other chronic pain: Secondary | ICD-10-CM | POA: Diagnosis present

## 2023-12-19 DIAGNOSIS — M25551 Pain in right hip: Secondary | ICD-10-CM | POA: Insufficient documentation

## 2023-12-19 DIAGNOSIS — M25552 Pain in left hip: Secondary | ICD-10-CM | POA: Diagnosis present

## 2023-12-19 DIAGNOSIS — M6281 Muscle weakness (generalized): Secondary | ICD-10-CM | POA: Diagnosis present

## 2023-12-19 DIAGNOSIS — R262 Difficulty in walking, not elsewhere classified: Secondary | ICD-10-CM | POA: Insufficient documentation

## 2023-12-19 NOTE — Therapy (Signed)
OUTPATIENT PHYSICAL THERAPY THORACOLUMBAR TREATMENT   Patient Name: Kristie Olson MRN: [id number] DOB:1956-06-16, 68 y.o., female Today's Date: 12/19/2023  END OF SESSION:  PT End of Session - 12/19/23 1302     Visit Number 2    Number of Visits 17    Date for PT Re-Evaluation 02/09/24    PT Start Time 1301    PT Stop Time 1340    PT Time Calculation (min) 39 min    Activity Tolerance Patient tolerated treatment well    Behavior During Therapy [facility] for tasks assessed/performed             Past Medical History:  Diagnosis Date   Asthma    Coronary artery disease    Diabetes mellitus without complication (HCC) 1990   Fibromyalgia    Hypertension    PAD (peripheral artery disease) (HCC) 09/03/2022   Past Surgical History:  Procedure Laterality Date   BREAST EXCISIONAL BIOPSY Left 1980's   neg   BREAST MASS EXCISION Left    age 66"s, benign   CESAREAN SECTION     CHOLECYSTECTOMY  2009   LOWER EXTREMITY ANGIOGRAPHY Left 09/03/2022   Procedure: Lower Extremity Angiography;  Surgeon: Annice Needy, MD;  Location: [facility] INVASIVE CV [facility];  Service: Cardiovascular;  Laterality: Left;   LOWER EXTREMITY ANGIOGRAPHY Right 09/08/2022   Procedure: Lower Extremity Angiography;  Surgeon: Annice Needy, MD;  Location: [facility];  Service: Cardiovascular;  Laterality: Right;   LOWER EXTREMITY ANGIOGRAPHY Left 01/18/2023   Procedure: Lower Extremity Angiography;  Surgeon: Renford Dills, MD;  Location: [facility] IN[facility];  Service: Cardiovascular;  Laterality: Left;   LOWER EXTREMITY ANGIOGRAPHY Left 02/14/2023   Procedure: Lower Extremity Angiography;  Surgeon: Annice Needy, MD;  Location: [facility];  Service: Cardiovascular;  Laterality: Left;   LOWER EXTREMITY ANGIOGRAPHY Left 06/27/2023   Procedure: Lower Extremity Angiography;  Surgeon: Annice Needy, MD;  Location: [facility];  Service: Cardiovascular;  Laterality: Left;   Patient Active Problem  List   Diagnosis Date Noted   Lumbar facet arthropathy 12/12/2023   Lumbar facet joint pain 12/12/2023   Osteoarthritis of facet joint of lumbar spine 12/12/2023   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   Spondylosis without myelopathy or radiculopathy, lumbosacral region 12/12/2023   Chronic anticoagulation (Plavix) 12/12/2023   Atherosclerotic peripheral vascular disease (HCC) 12/12/2023   Class 3 severe obesity with body mass index (BMI) of 40.0 to 44.9 in adult [facility]) 12/12/2023   Chronic hip pain (2ry area of Pain) (Bilateral) 12/12/2023   Chronic feet pain (3ry area of Pain) (Bilateral) 12/12/2023   Burning pain 12/12/2023   Chronic painful diabetic neuropathy (HCC) 12/12/2023   Chronic low back pain (1ry area of Pain) (Bilateral) w/o sciatica 12/12/2023   Chronic pain syndrome 06/27/2023   Pharmacologic therapy 06/27/2023   Disorder of skeletal system 06/27/2023   Problems influencing health status 06/27/2023   Abnormal MRI, lumbar spine (11/20/2022) 06/27/2023   PAD (peripheral artery disease) (HCC) 09/03/2022   Claudication of lower extremities (Bilateral) (HCC) 09/03/2022   Critical limb ischemia of lower extremities (Bilateral) (HCC)    Cervical spondylosis without myelopathy 01/16/2021   Disorder of bursae of shoulder region 01/16/2021   Right bundle branch block 09/18/2020   Skin macule or macular rash 09/18/2020   Vulvar itching 09/18/2020   Asthma 09/16/2020   Peripheral neuropathy 09/16/2020   Pancreatitis 09/16/2020   Anxiety and depression 09/16/2020   Type 2  diabetes mellitus (HCC) 09/16/2020   Benign essential hypertension 07/31/2020   Hematuria 07/31/2020   Proteinuria 07/31/2020   Type II or unspecified type diabetes mellitus with renal manifestations, not stated as uncontrolled 07/31/2020   Acute pancreatitis 04/28/2020   Diabetes mellitus with hyperglycemia (HCC)    Dehydration    Depression    Bronchitis 11/26/2018   Cervical radiculopathy  08/14/2018   Type 2 diabetes mellitus with hyperglycemia (HCC) 06/20/2018   Breast abscess of female 03/29/2018   Transient ischemic attack 08/05/2017   Slurred speech 07/27/2017   Dizziness 02/22/2017   Long term current use of insulin (HCC) 11/01/2013   Type 2 diabetes mellitus with diabetic neuropathy (HCC) 04/18/2013   Hyperlipidemia 07/19/2008   Hypertension 10/25/2007    PCP: Lorin Picket [facility]  REFERRING PROVIDER: Delano Metz, MD  REFERRING DIAG: M25.551,M25.552,G89.29 (ICD-10-CM) - Chronic hip pain, bilateral  Rationale for Evaluation and Treatment: Rehabilitation  THERAPY DIAG:  Pain in right hip  Pain in left hip  Chronic bilateral low back pain without sciatica  Muscle weakness (generalized)  Difficulty in walking, not elsewhere classified  ONSET DATE: B hip pain and lower back pain chronic for many years  SUBJECTIVE:                                                                                                                                                                                           SUBJECTIVE STATEMENT: Pt reports no pain in back currently. Pain in her feet from neuropathy.   PERTINENT HISTORY:  Pt reports B LBP where pain begins in lower back and refers into her groin. Sometimes happens bilaterally but R side is more painful than L side. Has history of LE neuropathy from feet to her knees. Pain is stiff and tight bilaterally. Pain feels deep. Pain begins in lower back then refers to hips. Laying down improves LBP  but hurts her hips. Standing aggravates hips and lower back after about 5 minutes. Pain worst in the morning and improves with motion. Has hx of ESI's in lower back but did not provide relief. Worst pain 8-9/10 NPS on average. Best pain 4/10 NPS. Cold weather makes her pain worse. Many near falls but no falls. Hurts with any standing and walking tasks. B hip pain worsens with laying down.  PAIN:  Are you having pain?  Yes: NPRS scale: 5/10 NPS Pain location: across lower back, central lower back, B hips into groins Pain description: tight, achey Aggravating factors: laying down, sitting, standing, walking Relieving factors: N/A  PRECAUTIONS: Fall  RED FLAGS: None   WEIGHT BEARING RESTRICTIONS: No  FALLS:  Has patient  fallen in last 6 months? No  LIVING ENVIRONMENT: Lives with: lives with their family and lives with their spouse Lives in: House/apartment Stairs: Yes: External: 5 steps; bilateral but cannot reach both Has following equipment at home: Quad cane small base, Walker - 4 wheeled, and bed side commode  OCCUPATION: Disability/retired  PLOF: Independent  PATIENT GOALS: Be able to more active and independent.  NEXT MD VISIT: 01/12/24  OBJECTIVE:  Note: Objective measures were completed at Evaluation unless otherwise noted.  DIAGNOSTIC FINDINGS:  Pending MRI in EMR. Will post results when available.   PATIENT SURVEYS:  Modified Oswestry 74%   COGNITION: Overall cognitive status: Within functional limits for tasks assessed     SENSATION: Light touch: WFL. Impaired at feet due to neuropathy.  POSTURE: rounded shoulders, forward head, decreased lumbar lordosis, and increased thoracic kyphosis  PALPATION: TTP at R sided SIJ, lumbar paraspinals L5-S1 and upper gluteals on R and L. TTP at bilateral greater trochanters.   LUMBAR ROM:   AROM eval  Flexion 20%  Extension 10%*  Right lateral flexion 50%*  Left lateral flexion 50%*  Right rotation 80%  Left rotation 80%   (Blank rows = not tested)  LOWER EXTREMITY ROM:     Active  Right eval Left eval  Hip flexion 75 75  Hip extension 0 (10* PROM) 0 (15* PROM)  Hip abduction    Hip adduction    Hip internal rotation 25* 20*  Hip external rotation 20 25*  Knee flexion    Knee extension    Ankle dorsiflexion    Ankle plantarflexion    Ankle inversion    Ankle eversion     (Blank rows = not tested)  LOWER  EXTREMITY MMT:    MMT Right eval Left eval  Hip flexion 4 4  Hip extension 2 2  Hip abduction 3 3  Hip adduction    Hip internal rotation 4* 4*  Hip external rotation 4* 4*  Knee flexion 4 4  Knee extension 4 4  Ankle dorsiflexion 3+ 3+  Ankle plantarflexion    Ankle inversion    Ankle eversion     (Blank rows = not tested)  LUMBAR SPECIAL TESTS:  FABER test: Negative FADDIR: Positive bilaterally  HIP/SIJ SPECIAL TESTS: Sacral thrust: positive Hip scour: positive R and L Hip distraction: relief on LLE but not RLE.  FUNCTIONAL TESTS:  Timed up and go (TUG): 38.5 seconds   5xSTS: deferred to next session  JOINT MOBILITY: Femoroacetabular AP hypomobile with concordant pain sign bilaterally.   GAIT: Distance walked: 10' Assistive device utilized: Counselling psychologist Level of assistance: CGA Comments: Decreased step lengths and R sided antalgic gait. Limited hip extension bialterally with limited step through gait.   TREATMENT DATE: 12/19/23   Physical Performance measurements:  5xSTS: 48.25 seconds 10 meter gait speed: .189 m/s  Discussed current scores compared to norms. Educated pt she is at an increased risk for falls based off of values.    There.ex:   Prone lying:   Hip extension: R/L AROM: 0 degrees. R/L PROM: 15/10 degrees  Hip extension AROM: 2/5 bilat.   Hook lying:   LTR's: 2x12/LE   Bridge. Unable to perform. Discontinued.  Glut squeeze in hook lying: 2x8     Provided HEP. Reviewed reps/sets/frequency.  PATIENT EDUCATION:  Education details: POC, prognosis Person educated: Patient Education method: Explanation Education comprehension: verbalized understanding  HOME EXERCISE PROGRAM: Access Code: [email or web address] URL: [email or web address] Date: 12/19/2023 Prepared by: Ronnie Derby  Exercises - Supine Lower Trunk  Rotation  - 1 x daily - 4-5 x weekly - 2 sets - 12 reps - Supine Posterior Pelvic Tilt  - 1 x daily - 4-5 x weekly - 2 sets - 12 reps - Sit to Stand  - 1 x daily - 3-4 x weekly - 3 sets - 6 reps   ASSESSMENT:  CLINICAL IMPRESSION: Pt arriving for first treatment session after evaluation. Continuing with further physical tests/measures. Pt scoring well below strength and gait speed norms for age group and sex further indicating high falls risk and significant limitations in functional mobility. Pt especially weak in glut max with inability to actively perform hip extension against gravity and in bridge position. Pt does have BLE numbness in LE's in hook lying attempting glut bridge likely with lumbar extension compensation causing N/T in LE's consistent with neurogenic claudication of the lumbar spine. As this eases with posterior tilts providing lumbar flexion. Pt will benefit from skilled PT services to address these deficits and maximize independence with functional mobility and reduce falls risk.   OBJECTIVE IMPAIRMENTS: Abnormal gait, decreased activity tolerance, decreased mobility, difficulty walking, decreased ROM, decreased strength, hypomobility, postural dysfunction, obesity, and pain.   ACTIVITY LIMITATIONS: carrying, lifting, bending, sitting, standing, squatting, sleeping, stairs, transfers, bed mobility, bathing, dressing, hygiene/grooming, and locomotion level  PARTICIPATION LIMITATIONS: cleaning, laundry, and community activity  PERSONAL FACTORS: Age, Behavior pattern, Fitness, Past/current experiences, Time since onset of injury/illness/exacerbation, and 3+ comorbidities: DM, Fibromyalgia, HTN, Peripheral Neuropathy, PAD, depression/anxiety, HLD  are also affecting patient's functional outcome.   REHAB POTENTIAL: Poor Chronicity of symptoms, significant PMH  CLINICAL DECISION MAKING: Evolving/moderate complexity  EVALUATION COMPLEXITY: Moderate   GOALS: Goals reviewed with  patient? No  SHORT TERM GOALS: Target date: 01/12/24  Pt will be independent with HEP to improve lumbar and hip AROM and LE strength. Baseline: 12/15/23: provide at next session Goal status: INITIAL  LONG TERM GOALS: Target date: 02/09/24  Pt will improve mODI by at least 13 points to demonstrate clinically significant reduction in disability due to her LBP.  Baseline: 12/15/23: 74% Goal status: INITIAL  2.  Pt will improve TUG to 20 seconds or less to demonstrate improved independence in transfers and reduced falls risk with household and community ADL's. Baseline: 12/15/23: 38.5 seconds Goal status: INITIAL  3.  Pt will improve 5xSTS to aged matched norms of 11.4 seconds or less to demonstrate clinically significant improvement in leg strength.  Baseline: 12/15/23: deferred to next session Goal status: INITIAL  4.  Pt will report worst pain as a 6/10 NPS or less to demonstrate clinically significant reduction in hip and lower back pain.  Baseline: 12/15/23: ranges from 8-10/10 at worst Goal status: INITIAL  5.  Pt will improve R/L hip flexion, IR, and ER by at least 5 degrees in all planes to assist in reduction of joint related pain. Baseline: 12/15/23:  Hip flexion 75 75  Hip extension    Hip abduction    Hip adduction    Hip internal rotation 25* 20*  Hip external rotation 20 25*    PLAN:  PT FREQUENCY: 1-2x/week  PT DURATION: 8 weeks  PLANNED INTERVENTIONS: 97164- PT Re-evaluation, 97110-Therapeutic exercises, 97530- Therapeutic activity, 97112- Neuromuscular re-education, 97535- Self Care, 95621- Manual therapy, [id number]- Gait training, 97014-  Electrical stimulation (unattended), [id number]- Electrical stimulation (manual), 54098- Traction (mechanical), Patient/Family education, Balance training, Stair training, Dry Needling, Joint mobilization, Joint manipulation, Spinal manipulation, Spinal mobilization, DME instructions, Cryotherapy, and Moist heat.  PLAN FOR NEXT SESSION:  Gentle, graded hip/lumbar mobility. Compound strength exercises in pain free ranges.    Delphia Grates. Fairly IV, PT, DPT Physical Therapist- [place]  [facility]  12/19/2023, 2:31 PM

## 2023-12-20 ENCOUNTER — Encounter: Payer: Self-pay | Admitting: Pain Medicine

## 2023-12-20 DIAGNOSIS — E559 Vitamin D deficiency, unspecified: Secondary | ICD-10-CM | POA: Insufficient documentation

## 2023-12-21 ENCOUNTER — Ambulatory Visit: Payer: 59

## 2023-12-21 DIAGNOSIS — M25551 Pain in right hip: Secondary | ICD-10-CM

## 2023-12-21 DIAGNOSIS — R262 Difficulty in walking, not elsewhere classified: Secondary | ICD-10-CM

## 2023-12-21 DIAGNOSIS — M6281 Muscle weakness (generalized): Secondary | ICD-10-CM

## 2023-12-21 DIAGNOSIS — G8929 Other chronic pain: Secondary | ICD-10-CM

## 2023-12-21 DIAGNOSIS — M25552 Pain in left hip: Secondary | ICD-10-CM

## 2023-12-21 NOTE — Therapy (Signed)
 OUTPATIENT PHYSICAL THERAPY THORACOLUMBAR TREATMENT   Patient Name: Kristie Olson MRN: [id number] DOB:Mar 16, 1956, 68 y.o., female Today's Date: 12/21/2023  END OF SESSION:  PT End of Session - 12/21/23 1259     Visit Number 3    Number of Visits 17    Date for PT Re-Evaluation 02/09/24    PT Start Time 1300    PT Stop Time 1344    PT Time Calculation (min) 44 min    Activity Tolerance Patient tolerated treatment well    Behavior During Therapy [facility] for tasks assessed/performed             Past Medical History:  Diagnosis Date   Asthma    Coronary artery disease    Diabetes mellitus without complication (HCC) 1990   Fibromyalgia    Hypertension    PAD (peripheral artery disease) (HCC) 09/03/2022   Past Surgical History:  Procedure Laterality Date   BREAST EXCISIONAL BIOPSY Left 1980's   neg   BREAST MASS EXCISION Left    age 55s, benign   CESAREAN SECTION     CHOLECYSTECTOMY  2009   LOWER EXTREMITY ANGIOGRAPHY Left 09/03/2022   Procedure: Lower Extremity Angiography;  Surgeon: Marea Selinda RAMAN, MD;  Location: [facility] INVASIVE CV LAB;  Service: Cardiovascular;  Laterality: Left;   LOWER EXTREMITY ANGIOGRAPHY Right 09/08/2022   Procedure: Lower Extremity Angiography;  Surgeon: Marea Selinda RAMAN, MD;  Location: [facility] INVASIVE CV LAB;  Service: Cardiovascular;  Laterality: Right;   LOWER EXTREMITY ANGIOGRAPHY Left 01/18/2023   Procedure: Lower Extremity Angiography;  Surgeon: Jama Cordella MATSU, MD;  Location: [facility] INVASIVE CV LAB;  Service: Cardiovascular;  Laterality: Left;   LOWER EXTREMITY ANGIOGRAPHY Left 02/14/2023   Procedure: Lower Extremity Angiography;  Surgeon: Marea Selinda RAMAN, MD;  Location: [facility] INVASIVE CV LAB;  Service: Cardiovascular;  Laterality: Left;   LOWER EXTREMITY ANGIOGRAPHY Left 06/27/2023   Procedure: Lower Extremity Angiography;  Surgeon: Marea Selinda RAMAN, MD;  Location: [facility] INVASIVE CV LAB;  Service: Cardiovascular;  Laterality: Left;   Patient Active Problem  List   Diagnosis Date Noted   Vitamin D  deficiency 12/20/2023   Lumbar facet arthropathy 12/12/2023   Lumbar facet joint pain 12/12/2023   Osteoarthritis of facet joint of lumbar spine 12/12/2023   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   Spondylosis without myelopathy or radiculopathy, lumbosacral region 12/12/2023   Chronic anticoagulation (Plavix ) 12/12/2023   Atherosclerotic peripheral vascular disease (HCC) 12/12/2023   Class 3 severe obesity with body mass index (BMI) of 40.0 to 44.9 in adult [facility]) 12/12/2023   Chronic hip pain (2ry area of Pain) (Bilateral) 12/12/2023   Chronic feet pain (3ry area of Pain) (Bilateral) 12/12/2023   Burning pain 12/12/2023   Chronic painful diabetic neuropathy (HCC) 12/12/2023   Chronic low back pain (1ry area of Pain) (Bilateral) w/o sciatica 12/12/2023   Chronic pain syndrome 06/27/2023   Pharmacologic therapy 06/27/2023   Disorder of skeletal system 06/27/2023   Problems influencing health status 06/27/2023   Abnormal MRI, lumbar spine (11/20/2022) 06/27/2023   PAD (peripheral artery disease) (HCC) 09/03/2022   Claudication of lower extremities (Bilateral) (HCC) 09/03/2022   Critical limb ischemia of lower extremities (Bilateral) (HCC)    Cervical spondylosis without myelopathy 01/16/2021   Disorder of bursae of shoulder region 01/16/2021   Right bundle branch block 09/18/2020   Skin macule or macular rash 09/18/2020   Vulvar itching 09/18/2020   Asthma 09/16/2020   Peripheral neuropathy 09/16/2020   Pancreatitis 09/16/2020   Anxiety and  depression 09/16/2020   Type 2 diabetes mellitus (HCC) 09/16/2020   Benign essential hypertension 07/31/2020   Hematuria 07/31/2020   Proteinuria 07/31/2020   Type II or unspecified type diabetes mellitus with renal manifestations, not stated as uncontrolled 07/31/2020   Acute pancreatitis 04/28/2020   Diabetes mellitus with hyperglycemia (HCC)    Dehydration    Depression    Bronchitis 11/26/2018    Cervical radiculopathy 08/14/2018   Type 2 diabetes mellitus with hyperglycemia (HCC) 06/20/2018   Breast abscess of female 03/29/2018   Transient ischemic attack 08/05/2017   Slurred speech 07/27/2017   Dizziness 02/22/2017   Long term current use of insulin  (HCC) 11/01/2013   Type 2 diabetes mellitus with diabetic neuropathy (HCC) 04/18/2013   Hyperlipidemia 07/19/2008   Hypertension 10/25/2007    PCP: [facility]  REFERRING PROVIDER: Tanya Glisson, MD  REFERRING DIAG: M25.551,M25.552,G89.29 (ICD-10-CM) - Chronic hip pain, bilateral  Rationale for Evaluation and Treatment: Rehabilitation  THERAPY DIAG:  Pain in right hip  Pain in left hip  Chronic bilateral low back pain without sciatica  Muscle weakness (generalized)  Difficulty in walking, not elsewhere classified  ONSET DATE: B hip pain and lower back pain chronic for many years  SUBJECTIVE:                                                                                                                                                                                           SUBJECTIVE STATEMENT: Pt reports having excessive pain after last PT session. Currently pain in her thighs about 4/10 NPS. Trialed HEP and does have pain with exercises at home.   PERTINENT HISTORY:  Pt reports B LBP where pain begins in lower back and refers into her groin. Sometimes happens bilaterally but R side is more painful than L side. Has history of LE neuropathy from feet to her knees. Pain is stiff and tight bilaterally. Pain feels deep. Pain begins in lower back then refers to hips. Laying down improves LBP  but hurts her hips. Standing aggravates hips and lower back after about 5 minutes. Pain worst in the morning and improves with motion. Has hx of ESI's in lower back but did not provide relief. Worst pain 8-9/10 NPS on average. Best pain 4/10 NPS. Cold weather makes her pain worse. Many near falls but no falls.  Hurts with any standing and walking tasks. B hip pain worsens with laying down.  PAIN:  Are you having pain? Yes: NPRS scale: 4/10 NPS Pain location: across lower back, central lower back, B hips into groins Pain description: tight, achey Aggravating factors: laying down, sitting, standing, walking Relieving  factors: N/A  PRECAUTIONS: Fall  RED FLAGS: None   WEIGHT BEARING RESTRICTIONS: No  FALLS:  Has patient fallen in last 6 months? No  LIVING ENVIRONMENT: Lives with: lives with their family and lives with their spouse Lives in: House/apartment Stairs: Yes: External: 5 steps; bilateral but cannot reach both Has following equipment at home: Quad cane small base, Walker - 4 wheeled, and bed side commode  OCCUPATION: Disability/retired  PLOF: Independent  PATIENT GOALS: Be able to more active and independent.  NEXT MD VISIT: 01/12/24  OBJECTIVE:  Note: Objective measures were completed at Evaluation unless otherwise noted.  DIAGNOSTIC FINDINGS:  Pending MRI in EMR. Will post results when available.   PATIENT SURVEYS:  Modified Oswestry 74%   COGNITION: Overall cognitive status: Within functional limits for tasks assessed     SENSATION: Light touch: WFL. Impaired at feet due to neuropathy.  POSTURE: rounded shoulders, forward head, decreased lumbar lordosis, and increased thoracic kyphosis  PALPATION: TTP at R sided SIJ, lumbar paraspinals L5-S1 and upper gluteals on R and L. TTP at bilateral greater trochanters.   LUMBAR ROM:   AROM eval  Flexion 20%  Extension 10%*  Right lateral flexion 50%*  Left lateral flexion 50%*  Right rotation 80%  Left rotation 80%   (Blank rows = not tested)  LOWER EXTREMITY ROM:     Active  Right eval Left eval  Hip flexion 75 75  Hip extension 0 (10* PROM) 0 (15* PROM)  Hip abduction    Hip adduction    Hip internal rotation 25* 20*  Hip external rotation 20 25*  Knee flexion    Knee extension    Ankle  dorsiflexion    Ankle plantarflexion    Ankle inversion    Ankle eversion     (Blank rows = not tested)  LOWER EXTREMITY MMT:    MMT Right eval Left eval  Hip flexion 4 4  Hip extension 2 2  Hip abduction 3 3  Hip adduction    Hip internal rotation 4* 4*  Hip external rotation 4* 4*  Knee flexion 4 4  Knee extension 4 4  Ankle dorsiflexion 3+ 3+  Ankle plantarflexion    Ankle inversion    Ankle eversion     (Blank rows = not tested)  LUMBAR SPECIAL TESTS:  FABER test: Negative FADDIR: Positive bilaterally  HIP/SIJ SPECIAL TESTS: Sacral thrust: positive Hip scour: positive R and L Hip distraction: relief on LLE but not RLE.  FUNCTIONAL TESTS:  Timed up and go (TUG): 38.5 seconds   5xSTS: deferred to next session  JOINT MOBILITY: Femoroacetabular AP hypomobile with concordant pain sign bilaterally.   GAIT: Distance walked: 10' Assistive device utilized: Counselling psychologist Level of assistance: CGA Comments: Decreased step lengths and R sided antalgic gait. Limited hip extension bialterally with limited step through gait.   TREATMENT DATE: 12/21/23   There.ex:  Nu-Step L1 for 3 min for LE hip/knee mobility. Not billed.   Hook lying:   LTR's: 2x12/LE   Alternating SAQ's: 1x8/LE. 1x3/LE.   Seated Heel raises: 3x8/LE.   There.Act: STS in chair with airex pad: 2x5   Seated in chair with airex pad: scap retractions with blue TB for back strengthening, 3x8. Mod VC's for form/technique maintaining seated neutral posture with back unsupported.  STS from chair with airex pad with gait 10' turning around cone. X5 laps, CGA. Use of QC in RUE.  Performing log roll technique to transfer from supine to sitting.  Educated on performing at home to improve back pain and functional transfers.   Education and adjustment on QC for appropriate height for pt. Pt reports ease of use and more comfortable with adjustment with gait.                                                                     PATIENT EDUCATION:  Education details: POC, prognosis Person educated: Patient Education method: Explanation Education comprehension: verbalized understanding  HOME EXERCISE PROGRAM: Access Code: [email or web address] URL: [email or web address] Date: 12/19/2023 Prepared by: Dorina Kingfisher  Exercises - Supine Lower Trunk Rotation  - 1 x daily - 4-5 x weekly - 2 sets - 12 reps - Supine Posterior Pelvic Tilt  - 1 x daily - 4-5 x weekly - 2 sets - 12 reps - Sit to Stand  - 1 x daily - 3-4 x weekly - 3 sets - 6 reps   ASSESSMENT:  CLINICAL IMPRESSION: Continuing PT POC with focus on lumbar/hip ROM, LE strength, and overall improving functional capabilities with gait and transfers for home and community ADL's. Pt overall has better tolerance for upright activities as laying down exercises reproduces lumbar mediated pain and N/T in LE's. Pt remains reliant on S/BUE support for transfers and from elevated surfaces for less pain provocation and due to LE weakness. Pt remains with significant gait deficits and LE weakness/pain limiting ability to complete Adl's leading to high falls risk. Pt will benefit from skilled PT services to address these deficits and maximize independence with functional mobility and reduce falls risk.   OBJECTIVE IMPAIRMENTS: Abnormal gait, decreased activity tolerance, decreased mobility, difficulty walking, decreased ROM, decreased strength, hypomobility, postural dysfunction, obesity, and pain.   ACTIVITY LIMITATIONS: carrying, lifting, bending, sitting, standing, squatting, sleeping, stairs, transfers, bed mobility, bathing, dressing, hygiene/grooming, and locomotion level  PARTICIPATION LIMITATIONS: cleaning, laundry, and community activity  PERSONAL FACTORS: Age, Behavior pattern, Fitness, Past/current experiences, Time since onset of injury/illness/exacerbation, and 3+ comorbidities: DM, Fibromyalgia, HTN, Peripheral Neuropathy, PAD,  depression/anxiety, HLD  are also affecting patient's functional outcome.   REHAB POTENTIAL: Poor Chronicity of symptoms, significant PMH  CLINICAL DECISION MAKING: Evolving/moderate complexity  EVALUATION COMPLEXITY: Moderate   GOALS: Goals reviewed with patient? No  SHORT TERM GOALS: Target date: 01/12/24  Pt will be independent with HEP to improve lumbar and hip AROM and LE strength. Baseline: 12/15/23: provide at next session Goal status: INITIAL  LONG TERM GOALS: Target date: 02/09/24  Pt will improve mODI by at least 13 points to demonstrate clinically significant reduction in disability due to her LBP.  Baseline: 12/15/23: 74% Goal status: INITIAL  2.  Pt will improve TUG to 20 seconds or less to demonstrate improved independence in transfers and reduced falls risk with household and community ADL's. Baseline: 12/15/23: 38.5 seconds Goal status: INITIAL  3.  Pt will improve 5xSTS to aged matched norms of 11.4 seconds or less to demonstrate clinically significant improvement in leg strength.  Baseline: 12/15/23: deferred to next session Goal status: INITIAL  4.  Pt will report worst pain as a 6/10 NPS or less to demonstrate clinically significant reduction in hip and lower back pain.  Baseline: 12/15/23: ranges from 8-10/10 at worst Goal status: INITIAL  5.  Pt will improve R/L hip  flexion, IR, and ER by at least 5 degrees in all planes to assist in reduction of joint related pain. Baseline: 12/15/23:  Hip flexion 75 75  Hip extension    Hip abduction    Hip adduction    Hip internal rotation 25* 20*  Hip external rotation 20 25*    PLAN:  PT FREQUENCY: 1-2x/week  PT DURATION: 8 weeks  PLANNED INTERVENTIONS: 97164- PT Re-evaluation, 97110-Therapeutic exercises, 97530- Therapeutic activity, 97112- Neuromuscular re-education, 97535- Self Care, [id number]- Manual therapy, [id number]- Gait training, 97014- Electrical stimulation (unattended), [id number]- Electrical stimulation  (manual), [id number]- Traction (mechanical), Patient/Family education, Balance training, Stair training, Dry Needling, Joint mobilization, Joint manipulation, Spinal manipulation, Spinal mobilization, DME instructions, Cryotherapy, and Moist heat.  PLAN FOR NEXT SESSION: seated, standing, and gait tasks. Transfers from elevated surfaces. Compound strength exercises in pain free ranges.    Dorina HERO. Fairly IV, PT, DPT Physical Therapist- [place]  [facility]  12/21/2023, 2:27 PM

## 2023-12-26 ENCOUNTER — Ambulatory Visit: Payer: 59

## 2023-12-28 ENCOUNTER — Ambulatory Visit: Payer: 59

## 2023-12-29 ENCOUNTER — Inpatient Hospital Stay
Admission: EM | Admit: 2023-12-29 | Discharge: 2023-12-31 | DRG: 193 | Disposition: A | Discharge status: Home / Self Care | Payer: 59 | Attending: Internal Medicine | Admitting: Internal Medicine

## 2023-12-29 ENCOUNTER — Emergency Department: Payer: 59

## 2023-12-29 DIAGNOSIS — Z833 Family history of diabetes mellitus: Secondary | ICD-10-CM | POA: Diagnosis not present

## 2023-12-29 DIAGNOSIS — Z7951 Long term (current) use of inhaled steroids: Secondary | ICD-10-CM

## 2023-12-29 DIAGNOSIS — R0600 Dyspnea, unspecified: Secondary | ICD-10-CM

## 2023-12-29 DIAGNOSIS — Z9049 Acquired absence of other specified parts of digestive tract: Secondary | ICD-10-CM

## 2023-12-29 DIAGNOSIS — E66813 Obesity, class 3: Secondary | ICD-10-CM | POA: Diagnosis present

## 2023-12-29 DIAGNOSIS — Z886 Allergy status to analgesic agent status: Secondary | ICD-10-CM

## 2023-12-29 DIAGNOSIS — J101 Influenza due to other identified influenza virus with other respiratory manifestations: Secondary | ICD-10-CM | POA: Diagnosis not present

## 2023-12-29 DIAGNOSIS — E1151 Type 2 diabetes mellitus with diabetic peripheral angiopathy without gangrene: Secondary | ICD-10-CM | POA: Diagnosis present

## 2023-12-29 DIAGNOSIS — Z794 Long term (current) use of insulin: Secondary | ICD-10-CM

## 2023-12-29 DIAGNOSIS — J45901 Unspecified asthma with (acute) exacerbation: Secondary | ICD-10-CM | POA: Diagnosis present

## 2023-12-29 DIAGNOSIS — Z803 Family history of malignant neoplasm of breast: Secondary | ICD-10-CM | POA: Diagnosis not present

## 2023-12-29 DIAGNOSIS — F17210 Nicotine dependence, cigarettes, uncomplicated: Secondary | ICD-10-CM | POA: Diagnosis present

## 2023-12-29 DIAGNOSIS — Z7902 Long term (current) use of antithrombotics/antiplatelets: Secondary | ICD-10-CM

## 2023-12-29 DIAGNOSIS — Z79899 Other long term (current) drug therapy: Secondary | ICD-10-CM | POA: Diagnosis not present

## 2023-12-29 DIAGNOSIS — I739 Peripheral vascular disease, unspecified: Secondary | ICD-10-CM | POA: Diagnosis present

## 2023-12-29 DIAGNOSIS — F17200 Nicotine dependence, unspecified, uncomplicated: Secondary | ICD-10-CM | POA: Insufficient documentation

## 2023-12-29 DIAGNOSIS — F32A Depression, unspecified: Secondary | ICD-10-CM | POA: Diagnosis present

## 2023-12-29 DIAGNOSIS — Z7984 Long term (current) use of oral hypoglycemic drugs: Secondary | ICD-10-CM

## 2023-12-29 DIAGNOSIS — E119 Type 2 diabetes mellitus without complications: Secondary | ICD-10-CM

## 2023-12-29 DIAGNOSIS — J209 Acute bronchitis, unspecified: Secondary | ICD-10-CM | POA: Diagnosis present

## 2023-12-29 DIAGNOSIS — I10 Essential (primary) hypertension: Secondary | ICD-10-CM | POA: Diagnosis present

## 2023-12-29 DIAGNOSIS — Z885 Allergy status to narcotic agent status: Secondary | ICD-10-CM | POA: Diagnosis not present

## 2023-12-29 DIAGNOSIS — Z7982 Long term (current) use of aspirin: Secondary | ICD-10-CM

## 2023-12-29 DIAGNOSIS — Z809 Family history of malignant neoplasm, unspecified: Secondary | ICD-10-CM

## 2023-12-29 DIAGNOSIS — J9601 Acute respiratory failure with hypoxia: Secondary | ICD-10-CM | POA: Diagnosis present

## 2023-12-29 DIAGNOSIS — M797 Fibromyalgia: Secondary | ICD-10-CM | POA: Diagnosis present

## 2023-12-29 DIAGNOSIS — I2510 Atherosclerotic heart disease of native coronary artery without angina pectoris: Secondary | ICD-10-CM | POA: Diagnosis present

## 2023-12-29 LAB — CBC
HCT: 39.1 % (ref 36.0–46.0)
HCT: 39.9 % (ref 36.0–46.0)
Hemoglobin: 13.2 g/dL (ref 12.0–15.0)
Hemoglobin: 13.2 g/dL (ref 12.0–15.0)
MCH: 31 pg (ref 26.0–34.0)
MCH: 31.1 pg (ref 26.0–34.0)
MCHC: 33.1 g/dL (ref 30.0–36.0)
MCHC: 33.8 g/dL (ref 30.0–36.0)
MCV: 92.2 fL (ref 80.0–100.0)
MCV: 93.7 fL (ref 80.0–100.0)
Platelets: 384 10*3/uL (ref 150–400)
Platelets: 389 10*3/uL (ref 150–400)
RBC: 4.24 MIL/uL (ref 3.87–5.11)
RBC: 4.26 MIL/uL (ref 3.87–5.11)
RDW: 13.8 % (ref 11.5–15.5)
RDW: 13.8 % (ref 11.5–15.5)
WBC: 4.8 10*3/uL (ref 4.0–10.5)
WBC: 4.9 10*3/uL (ref 4.0–10.5)
nRBC: 0 % (ref 0.0–0.2)
nRBC: 0 % (ref 0.0–0.2)

## 2023-12-29 LAB — BASIC METABOLIC PANEL
Anion gap: 12 (ref 5–15)
BUN: 10 mg/dL (ref 8–23)
CO2: 29 mmol/L (ref 22–32)
Calcium: 9.3 mg/dL (ref 8.9–10.3)
Chloride: 97 mmol/L — ABNORMAL LOW (ref 98–111)
Creatinine, Ser: 0.78 mg/dL (ref 0.44–1.00)
GFR, Estimated: >60 mL/min (ref >60)
Glucose, Bld: 166 mg/dL — ABNORMAL HIGH (ref 70–99)
Potassium: 2.9 mmol/L — ABNORMAL LOW (ref 3.5–5.1)
Sodium: 138 mmol/L (ref 135–145)

## 2023-12-29 LAB — RESP PANEL BY RT-PCR (RSV, FLU A&B, COVID)  RVPGX2
Influenza A by PCR: POSITIVE — AB
Influenza B by PCR: NEGATIVE
Resp Syncytial Virus by PCR: NEGATIVE
SARS Coronavirus 2 by RT PCR: NEGATIVE

## 2023-12-29 LAB — CREATININE, SERUM
Creatinine, Ser: 0.73 mg/dL (ref 0.44–1.00)
GFR, Estimated: >60 mL/min (ref >60)

## 2023-12-29 MED ORDER — INSULIN ASPART 100 UNIT/ML IJ SOLN
0.0000 [IU] | Freq: Every day | INTRAMUSCULAR | Status: DC
  Administered 2023-12-29: 4 [IU] via SUBCUTANEOUS
  Filled 2023-12-29: qty 1

## 2023-12-29 MED ORDER — ENOXAPARIN SODIUM 40 MG/0.4ML IJ SOSY: 40.0000 mg | PREFILLED_SYRINGE | INTRAMUSCULAR | Status: DC

## 2023-12-29 MED ORDER — INSULIN ASPART 100 UNIT/ML IJ SOLN
0.0000 [IU] | Freq: Three times a day (TID) | INTRAMUSCULAR | Status: DC
  Administered 2023-12-30: 18 [IU] via SUBCUTANEOUS
  Administered 2023-12-30: 20 [IU] via SUBCUTANEOUS
  Administered 2023-12-30: 11 [IU] via SUBCUTANEOUS
  Administered 2023-12-31: 7 [IU] via SUBCUTANEOUS
  Filled 2023-12-29 (×4): qty 1

## 2023-12-29 MED ORDER — ACETAMINOPHEN 325 MG PO TABS
650.0000 mg | ORAL_TABLET | Freq: Four times a day (QID) | ORAL | Status: DC | PRN
  Administered 2023-12-31: 650 mg via ORAL
  Filled 2023-12-29: qty 2

## 2023-12-29 MED ORDER — METHYLPREDNISOLONE SODIUM SUCC 125 MG IJ SOLR
125.0000 mg | Freq: Once | INTRAMUSCULAR | Status: AC
  Administered 2023-12-29: 125 mg via INTRAVENOUS
  Filled 2023-12-29: qty 2

## 2023-12-29 MED ORDER — OSELTAMIVIR PHOSPHATE 75 MG PO CAPS
75.0000 mg | ORAL_CAPSULE | Freq: Two times a day (BID) | ORAL | Status: DC
  Administered 2023-12-30 – 2023-12-31 (×4): 75 mg via ORAL
  Filled 2023-12-29 (×4): qty 1

## 2023-12-29 MED ORDER — FLUOXETINE HCL 20 MG PO CAPS
40.0000 mg | ORAL_CAPSULE | Freq: Every day | ORAL | Status: DC
  Administered 2023-12-30 – 2023-12-31 (×2): 40 mg via ORAL
  Filled 2023-12-29 (×2): qty 2

## 2023-12-29 MED ORDER — IPRATROPIUM-ALBUTEROL 0.5-2.5 (3) MG/3ML IN SOLN
3.0000 mL | Freq: Four times a day (QID) | RESPIRATORY_TRACT | Status: DC
  Administered 2023-12-29 – 2023-12-31 (×5): 3 mL via RESPIRATORY_TRACT
  Filled 2023-12-29 (×6): qty 3

## 2023-12-29 MED ORDER — HYDROCHLOROTHIAZIDE 25 MG PO TABS
25.0000 mg | ORAL_TABLET | Freq: Every day | ORAL | Status: DC
  Administered 2023-12-30 – 2023-12-31 (×2): 25 mg via ORAL
  Filled 2023-12-29 (×2): qty 1

## 2023-12-29 MED ORDER — INSULIN GLARGINE-YFGN 100 UNIT/ML ~~LOC~~ SOLN
20.0000 [IU] | Freq: Every day | SUBCUTANEOUS | Status: DC
  Administered 2023-12-29 – 2023-12-31 (×2): 20 [IU] via SUBCUTANEOUS
  Filled 2023-12-29 (×3): qty 0.2

## 2023-12-29 MED ORDER — LISINOPRIL-HYDROCHLOROTHIAZIDE 20-25 MG PO TABS: 1.0000 | ORAL_TABLET | Freq: Every day | ORAL | Status: DC

## 2023-12-29 MED ORDER — HYDROCODONE-ACETAMINOPHEN 5-325 MG PO TABS
1.0000 | ORAL_TABLET | ORAL | Status: DC | PRN
  Administered 2023-12-29: 1 via ORAL
  Administered 2023-12-30: 2 via ORAL
  Administered 2023-12-30: 1 via ORAL
  Administered 2023-12-30: 2 via ORAL
  Filled 2023-12-29: qty 1
  Filled 2023-12-29: qty 2
  Filled 2023-12-29: qty 1
  Filled 2023-12-29: qty 2

## 2023-12-29 MED ORDER — ATORVASTATIN CALCIUM 20 MG PO TABS
10.0000 mg | ORAL_TABLET | Freq: Every day | ORAL | Status: DC
  Administered 2023-12-29 – 2023-12-31 (×3): 10 mg via ORAL
  Filled 2023-12-29 (×3): qty 1

## 2023-12-29 MED ORDER — IPRATROPIUM-ALBUTEROL 0.5-2.5 (3) MG/3ML IN SOLN
3.0000 mL | Freq: Once | RESPIRATORY_TRACT | Status: AC
  Administered 2023-12-29: 3 mL via RESPIRATORY_TRACT
  Filled 2023-12-29: qty 3

## 2023-12-29 MED ORDER — PANTOPRAZOLE SODIUM 40 MG PO TBEC
40.0000 mg | DELAYED_RELEASE_TABLET | Freq: Every day | ORAL | Status: DC
  Administered 2023-12-30 – 2023-12-31 (×2): 40 mg via ORAL
  Filled 2023-12-29 (×2): qty 1

## 2023-12-29 MED ORDER — CLOPIDOGREL BISULFATE 75 MG PO TABS
75.0000 mg | ORAL_TABLET | Freq: Every day | ORAL | Status: DC
  Administered 2023-12-30 – 2023-12-31 (×2): 75 mg via ORAL
  Filled 2023-12-29 (×2): qty 1

## 2023-12-29 MED ORDER — LISINOPRIL 20 MG PO TABS
20.0000 mg | ORAL_TABLET | Freq: Every day | ORAL | Status: DC
  Administered 2023-12-30 – 2023-12-31 (×2): 20 mg via ORAL
  Filled 2023-12-29: qty 2
  Filled 2023-12-29: qty 1

## 2023-12-29 MED ORDER — ACETAMINOPHEN 650 MG RE SUPP: 650.0000 mg | Freq: Four times a day (QID) | RECTAL | Status: DC | PRN

## 2023-12-29 MED ORDER — ONDANSETRON HCL 4 MG PO TABS: 4.0000 mg | ORAL_TABLET | Freq: Four times a day (QID) | ORAL | Status: DC | PRN

## 2023-12-29 MED ORDER — ALBUTEROL SULFATE (2.5 MG/3ML) 0.083% IN NEBU
2.5000 mg | INHALATION_SOLUTION | RESPIRATORY_TRACT | Status: DC | PRN
  Administered 2023-12-29 – 2023-12-30 (×2): 2.5 mg via RESPIRATORY_TRACT
  Filled 2023-12-29 (×2): qty 3

## 2023-12-29 MED ORDER — PREDNISONE 20 MG PO TABS
40.0000 mg | ORAL_TABLET | Freq: Every day | ORAL | Status: DC
  Administered 2023-12-30 – 2023-12-31 (×2): 40 mg via ORAL
  Filled 2023-12-29 (×2): qty 2

## 2023-12-29 MED ORDER — ENOXAPARIN SODIUM 60 MG/0.6ML IJ SOSY
50.0000 mg | PREFILLED_SYRINGE | INTRAMUSCULAR | Status: DC
  Administered 2023-12-29 – 2023-12-31 (×2): 50 mg via SUBCUTANEOUS
  Filled 2023-12-29 (×2): qty 0.6

## 2023-12-29 MED ORDER — NICOTINE 14 MG/24HR TD PT24
14.0000 mg | MEDICATED_PATCH | Freq: Every day | TRANSDERMAL | Status: DC
  Administered 2023-12-30 – 2023-12-31 (×2): 14 mg via TRANSDERMAL
  Filled 2023-12-29 (×3): qty 1

## 2023-12-29 MED ORDER — TIZANIDINE HCL 2 MG PO TABS
2.0000 mg | ORAL_TABLET | Freq: Two times a day (BID) | ORAL | Status: DC | PRN
Start: 2023-12-29 — End: 2023-12-31

## 2023-12-29 MED ORDER — GUAIFENESIN ER 600 MG PO TB12
600.0000 mg | ORAL_TABLET | Freq: Two times a day (BID) | ORAL | Status: DC
Start: 2023-12-29 — End: 2023-12-31
  Administered 2023-12-29 – 2023-12-31 (×4): 600 mg via ORAL
  Filled 2023-12-29 (×5): qty 1

## 2023-12-29 MED ORDER — PREDNISONE 20 MG PO TABS
60.0000 mg | ORAL_TABLET | Freq: Once | ORAL | Status: AC
  Administered 2023-12-29: 60 mg via ORAL
  Filled 2023-12-29: qty 3

## 2023-12-29 MED ORDER — ONDANSETRON HCL 4 MG/2ML IJ SOLN: 4.0000 mg | Freq: Four times a day (QID) | INTRAMUSCULAR | Status: DC | PRN

## 2023-12-29 MED ORDER — AMITRIPTYLINE HCL 10 MG PO TABS: 10.0000 mg | ORAL_TABLET | Freq: Every evening | ORAL | Status: DC | PRN

## 2023-12-29 NOTE — ED Provider Notes (Signed)
[facility] Provider Note    Event Date/Time   First MD Initiated Contact with Patient 12/29/23 1631     (approximate)  History   Chief Complaint: Cough and Generalized Body Aches  HPI  Kristie Olson is a 68 y.o. female with a past medical history of asthma, diabetes, hypertension, presents to the emergency department for 4 days of cough congestion and shortness of breath.  According to the patient for the past 4 days she has been coughing with congestion, subjective fever and feeling short of breath.  Patient is a daily smoker but denies any known history of COPD although has been told in the past that she might have it.  Patient does state a history of asthma.  Patient states her son is here with there for similar symptoms as well.  Denies any chest pain.  Physical Exam   Triage Vital Signs: ED Triage Vitals  Encounter Vitals Group     BP --      Systolic BP Percentile --      Diastolic BP Percentile --      Pulse Rate 12/29/23 1539 (!) 106     Resp 12/29/23 1539 (!) 22     Temp 12/29/23 1539 98.2 F (36.8 C)     Temp Source 12/29/23 1539 Oral     SpO2 12/29/23 1538 92 %     Weight 12/29/23 1540 207 lb (93.9 kg)     Height 12/29/23 1540 5' (1.524 m)     Head Circumference --      Peak Flow --      Pain Score 12/29/23 1540 10     Pain Loc --      Pain Education --      Exclude from Growth Chart --     Most recent vital signs: Vitals:   12/29/23 1538 12/29/23 1539  Pulse:  (!) 106  Resp:  (!) 22  Temp:  98.2 F (36.8 C)  SpO2: 92% 92%    General: Awake, no distress.  CV:  Good peripheral perfusion.  Regular rate and rhythm  Resp:  Normal effort.  Equal breath sounds bilaterally.  Mild expiratory wheeze bilaterally. Abd:  No distention.  Soft, nontender.    ED Results / Procedures / Treatments   RADIOLOGY  I have reviewed interpret the chest x-ray images.  No obvious consolidation seen on my evaluation.   MEDICATIONS ORDERED IN  ED: Medications  ipratropium-albuterol (DUONEB) 0.5-2.5 (3) MG/3ML nebulizer solution 3 mL (has no administration in time range)  predniSONE (DELTASONE) tablet 60 mg (has no administration in time range)  ipratropium-albuterol (DUONEB) 0.5-2.5 (3) MG/3ML nebulizer solution 3 mL (has no administration in time range)  ipratropium-albuterol (DUONEB) 0.5-2.5 (3) MG/3ML nebulizer solution 3 mL (has no administration in time range)     IMPRESSION / MDM / ASSESSMENT AND PLAN / ED COURSE  I reviewed the triage vital signs and the nursing notes.  Patient's presentation is most consistent with acute presentation with potential threat to life or bodily function.  Patient presents to the emergency department for 3 to 4 days of cough congestion subjective fever shortness of breath.  Patient does have mild expiratory wheeze bilaterally.  We will treat with breathing treatments.  Will obtain a respiratory panel as well as a chest x-ray and continue to closely monitor.  Patient's respiratory panel is positive for influenza A likely explaining the patient's symptoms.  Chest x-ray pending.  Chest x-ray shows no acute process.  We will check labs start an IV dose Solu-Medrol.  Patient continues to sat around 90% on room air occasionally dipping as low as 88 and as high as 92%.  Patient continues with shortness of breath and cough.  Given the patient's intermittent hypoxia we will admit to the hospital service for influenza A likely exacerbated by underlying asthma or possibly undiagnosed COPD given long smoking history.  FINAL CLINICAL IMPRESSION(S) / ED DIAGNOSES   Dyspnea Influenza A  Note:  This document was prepared using Dragon voice recognition software and may include unintentional dictation errors.   Minna Antis, MD 12/29/23 [place]

## 2023-12-29 NOTE — Assessment & Plan Note (Signed)
Continue fluoxetine and amitriptyline.

## 2023-12-29 NOTE — ED Notes (Signed)
Pt placed on 2L [place] by EDP.

## 2023-12-29 NOTE — Progress Notes (Signed)
PHARMACIST - PHYSICIAN COMMUNICATION  CONCERNING:  Enoxaparin (Lovenox) for DVT Prophylaxis    RECOMMENDATION: Patient was prescribed enoxaprin 40mg  q24 hours for VTE prophylaxis.   Filed Weights   12/29/23 1540  Weight: 93.9 kg (207 lb)    Body mass index is 40.43 kg/m.  Estimated Creatinine Clearance: 68 mL/min (by C-G formula based on SCr of 0.72 mg/dL).   Based on [facility] policy patient is candidate for enoxaparin 0.5mg /kg TBW SQ every 24 hours based on BMI being >30.   DESCRIPTION: Pharmacy has adjusted enoxaparin dose per [facility] policy.  Patient is now receiving enoxaparin 50 mg every 24 hours    Jaquise Faux Rodriguez-Guzman PharmD, BCPS 12/29/2023 7:25 PM

## 2023-12-29 NOTE — Progress Notes (Unsigned)
PROVIDER NOTE: Information contained herein reflects review and annotations entered in association with encounter. Interpretation of such information and data should be left to medically-trained personnel. Information provided to patient can be located elsewhere in the medical record under "Patient Instructions". Document created using STT-dictation technology, any transcriptional errors that may result from process are unintentional.    Patient: Kristie Olson  Service Category: E/M  Provider: Oswaldo Done, MD  DOB: [birth year]  DOS: 01/02/2024  Referring Provider: [facility]  MRN: [id number]  Specialty: Interventional Pain Management  PCP: [facility], [facility]  Type: Established Patient  Setting: Ambulatory outpatient    Location: Office  Delivery: Face-to-face     Primary Reason(s) for Visit: Encounter for evaluation before starting new chronic pain management plan of care (Level of risk: moderate) CC: No chief complaint on file.  HPI  Kristie Olson is a 68 y.o. year old, female patient, who comes today for a follow-up evaluation to review the test results and decide on a treatment plan. She has Slurred speech; Breast abscess of female; Bronchitis; Acute pancreatitis; Hypertension; Diabetes mellitus with hyperglycemia (HCC); Dehydration; Depression; Acute bronchitis with asthma with acute exacerbation; Benign essential hypertension; Cervical radiculopathy; Cervical spondylosis without myelopathy; Dizziness; Hematuria; Hyperlipidemia; Long term current use of insulin (HCC); Peripheral neuropathy; Proteinuria; Right bundle branch block; Skin macule or macular rash; Transient ischemic attack; Type 2 diabetes mellitus with diabetic neuropathy (HCC); Type 2 diabetes mellitus with hyperglycemia (HCC); Vulvar itching; Pancreatitis; Anxiety and depression; Type II or unspecified type diabetes mellitus with renal manifestations, not stated as uncontrolled; Insulin dependent type 2  diabetes mellitus (HCC); Disorder of bursae of shoulder region; PAD (peripheral artery disease) (HCC); Claudication of lower extremities (Bilateral) (HCC); Critical limb ischemia of lower extremities (Bilateral) (HCC); Chronic pain syndrome; Pharmacologic therapy; Disorder of skeletal system; Problems influencing health status; Abnormal MRI, lumbar spine (11/20/2022); Lumbar facet arthropathy; Lumbar facet joint pain; Osteoarthritis of facet joint of lumbar spine; Osteoarthritis of lumbar spine; Spondylosis without myelopathy or radiculopathy, lumbosacral region; Chronic anticoagulation (Plavix); Atherosclerotic peripheral vascular disease (HCC); Class 3 severe obesity with body mass index (BMI) of 40.0 to 44.9 in adult [facility]); Chronic hip pain (2ry area of Pain) (Bilateral); Chronic feet pain (3ry area of Pain) (Bilateral); Burning pain; Chronic painful diabetic neuropathy (HCC); Chronic low back pain (1ry area of Pain) (Bilateral) w/o sciatica; Vitamin D deficiency; Influenza A; Acute respiratory failure with hypoxia (HCC); Tobacco use disorder; and Obesity, Class III, BMI 40-49.9 (morbid obesity) (HCC) on their problem list. Her primarily concern today is the No chief complaint on file.  Pain Assessment: Location:     Radiating:   Onset:   Duration:   Quality:   Severity:  /10 (subjective, self-reported pain score)  Effect on ADL:   Timing:   Modifying factors:   BP:    HR:    Kristie Olson comes in today for a follow-up visit after her initial evaluation on 12/12/2023. Today we went over the results of her tests. These were explained in "Layman's terms". During today's appointment we went over my diagnostic impression, as well as the proposed treatment plan.  Review of initial evaluation (12/12/2023): "The patient, with a history of peripheral vascular disease and diabetes, presented to the pain clinic for the first time in over a decade. The primary complaint was severe lower back pain that radiates  to the groin area, exacerbated by standing and certain movements. The pain was described as being on both sides of the spine  and extending into the groin area. The patient reported that the pain subsides when sitting, but certain movements can trigger it. Lying down was reported to cause mild pain, but the most severe pain was experienced when standing.   The patient also reported a burning sensation and pain in both feet, which was described as constant. The distal portion of the patient's legs, from the knee down, was reported to be numb. This numbness and burning sensation in the feet started in the year 2024. The patient also reported numbness in the back of both legs, but not much pain unless walking or standing for extended periods.   The patient had a history of stent placements in both legs due to peripheral vascular disease. The most recent check on the stents was in December of the previous year, and they were reported to be working. The patient was on daily Plavix, prescribed by her vascular surgeon, since her first surgery.   The patient had previously received injections for back pain, which were reported to have provided no relief. The patient had not had any back surgeries or physical therapy for her back or leg pain. The patient had a home visit from a physical therapist the previous year.   The patient's weight was reported to fluctuate, and she expressed a desire to lose weight to alleviate back pain. The patient's current weight was 207 pounds, and her height was 5 feet 3/4 inches. The patient was also a diabetic and was on insulin.   The patient's goal was to be able to walk around and do activities without pain. She expressed frustration at her current limitations due to pain and was eager for any treatment that could provide relief."  Review of diagnostic tests ordered on 12/12/2023: Lab work:  Diagnostic imaging:   Discussed the use of AI scribe software for clinical note  transcription with the patient, who gave verbal consent to proceed.  History of Present Illness          Patient presented with interventional treatment options. Ms. Bilello was informed that I will not be providing medication management. Pharmacotherapy evaluation including recommendations may be offered, if specifically requested.   Controlled Substance Pharmacotherapy Assessment REMS (Risk Evaluation and Mitigation Strategy)  Opioid Analgesic: None MME/day: 0 mg/day  Pill Count: None expected due to no prior prescriptions written by our practice. No notes on file Pharmacokinetics: Liberation and absorption (onset of action): WNL Distribution (time to peak effect): WNL Metabolism and excretion (duration of action): WNL         Pharmacodynamics: Desired effects: Analgesia: Ms. Radi reports >50% benefit. Functional ability: Patient reports that medication allows her to accomplish basic ADLs Clinically meaningful improvement in function (CMIF): Sustained CMIF goals met Perceived effectiveness: Described as relatively effective, allowing for increase in activities of daily living (ADL) Undesirable effects: Side-effects or Adverse reactions: None reported Monitoring: [place] PMP: PDMP reviewed during this encounter. Online review of the past 75-month period previously conducted. Not applicable at this point since we have not taken over the patient's medication management yet. List of other Serum/Urine Drug Screening Test(s):  No results found for: "AMPHSCRSER", "BARBSCRSER", "BENZOSCRSER", "COCAINSCRSER", "COCAINSCRNUR", "PCPSCRSER", "THCSCRSER", "THCU", "CANNABQUANT", "OPIATESCRSER", "OXYSCRSER", "PROPOXSCRSER", "ETH", "CBDTHCR", "D8THCCBX", "D9THCCBX" List of all UDS test(s) done:  Lab Results  Component Value Date   SUMMARY FINAL 12/12/2023   Last UDS on record: Summary  Date Value Ref Range Status  12/12/2023 FINAL  Final    Comment:     ==================================================================== Compliance Drug  Analysis, Ur ==================================================================== Test                             Result       Flag       Units  Drug Present and Declared for Prescription Verification   Fluoxetine                     PRESENT      EXPECTED   Norfluoxetine                  PRESENT      EXPECTED    Norfluoxetine is an expected metabolite of fluoxetine.    Acetaminophen                  PRESENT      EXPECTED  Drug Present not Declared for Prescription Verification   Diphenhydramine                PRESENT      UNEXPECTED  Drug Absent but Declared for Prescription Verification   Tizanidine                     Not Detected UNEXPECTED    Tizanidine, as indicated in the declared medication list, is not    always detected even when used as directed.    Amitriptyline                  Not Detected UNEXPECTED   Salicylate                     Not Detected UNEXPECTED    Aspirin, as indicated in the declared medication list, is not always    detected even when used as directed.  ==================================================================== Test                      Result    Flag   Units      Ref Range   Creatinine              51               mg/dL      >=19 ==================================================================== Declared Medications:  The flagging and interpretation on this report are based on the  following declared medications.  Unexpected results may arise from  inaccuracies in the declared medications.   **Note: The testing scope of this panel includes these medications:   Amitriptyline (Elavil)  Fluoxetine (Prozac)   **Note: The testing scope of this panel does not include small to  moderate amounts of these reported medications:   Acetaminophen (Tylenol)  Aspirin  Tizanidine (Zanaflex)   **Note: The testing scope of this panel does not include the   following reported medications:   Albuterol (Ventolin HFA)  Atorvastatin (Lipitor)  Clopidogrel (Plavix)  Esomeprazole (Nexium)  Fluticasone (Advair)  Glipizide  Hydrochlorothiazide (Hyzaar)  Insulin  Losartan (Hyzaar)  Pantoprazole (Protonix)  Salmeterol (Advair)  Triamcinolone (Kenalog) ==================================================================== For clinical consultation, please call [phone]. ====================================================================    UDS interpretation: No unexpected findings.          Medication Assessment Form: Not applicable. No opioids. Treatment compliance: Not applicable Risk Assessment Profile: Aberrant behavior: See initial evaluations. None observed or detected today Comorbid factors increasing risk of overdose: See initial evaluation. No additional risks detected today Opioid risk tool (ORT):     12/12/2023   11:26  AM  Opioid Risk   Alcohol 3  Opioid Risk Tool Scoring 3  Opioid Risk Interpretation Low Risk    ORT Scoring interpretation table:  Score <3 = Low Risk for SUD  Score between 4-7 = Moderate Risk for SUD  Score >8 = High Risk for Opioid Abuse   Risk of substance use disorder (SUD): Low  Risk Mitigation Strategies:  Patient opioid safety counseling: No controlled substances prescribed. Patient-Prescriber Agreement (PPA): No agreement signed.  Controlled substance notification to other providers: None required. No opioid therapy.  Pharmacologic Plan: Non-opioid analgesic therapy offered. Interventional alternatives discussed.             Laboratory Chemistry Profile   Renal Lab Results  Component Value Date   BUN 15 12/31/2023   CREATININE 0.75 12/31/2023   GFRAA >60 07/01/2020   GFRNONAA >60 12/31/2023   PROTEINUR 100 (A) 02/14/2023     Electrolytes Lab Results  Component Value Date   NA 138 12/31/2023   K 3.3 (L) 12/31/2023   CL 99 12/31/2023   CALCIUM 9.1 12/31/2023   MG 1.9  12/31/2023     Hepatic Lab Results  Component Value Date   AST 20 12/12/2023   ALT 14 12/12/2023   ALBUMIN 4.1 12/12/2023   ALKPHOS 81 12/12/2023   LIPASE 69 (H) 04/28/2020     ID Lab Results  Component Value Date   HIV Non Reactive 12/29/2023   SARSCOV2NAA NEGATIVE 12/29/2023     Bone Lab Results  Component Value Date   VD25OH 13.48 (L) 12/12/2023     Endocrine Lab Results  Component Value Date   GLUCOSE 138 (H) 12/31/2023   GLUCOSEU NEGATIVE 02/14/2023   HGBA1C 9.6 (H) 12/29/2023     Neuropathy Lab Results  Component Value Date   VITAMINB12 151 (L) 12/12/2023   HGBA1C 9.6 (H) 12/29/2023   HIV Non Reactive 12/29/2023     CNS No results found for: "COLORCSF", "APPEARCSF", "RBCCOUNTCSF", "WBCCSF", "POLYSCSF", "LYMPHSCSF", "EOSCSF", "PROTEINCSF", "GLUCCSF", "JCVIRUS", "CSFOLI", "IGGCSF", "LABACHR", "ACETBL"   Inflammation (CRP: Acute  ESR: Chronic) Lab Results  Component Value Date   CRP 0.5 12/12/2023   ESRSEDRATE 28 12/12/2023     Rheumatology No results found for: "RF", "ANA", "LABURIC", "URICUR", "LYMEIGGIGMAB", "LYMEABIGMQN", "HLAB27"   Coagulation Lab Results  Component Value Date   INR 1.0 09/02/2022   LABPROT 13.0 09/02/2022   APTT 36 09/02/2022   PLT 393 12/31/2023     Cardiovascular Lab Results  Component Value Date   TROPONINI <0.03 11/26/2018   HGB 13.1 12/31/2023   HCT 38.1 12/31/2023     Screening Lab Results  Component Value Date   SARSCOV2NAA NEGATIVE 12/29/2023   HIV Non Reactive 12/29/2023     Cancer No results found for: "CEA", "CA125", "LABCA2"   Allergens No results found for: "ALMOND", "APPLE", "ASPARAGUS", "AVOCADO", "BANANA", "BARLEY", "BASIL", "BAYLEAF", "GREENBEAN", "LIMABEAN", "WHITEBEAN", "BEEFIGE", "REDBEET", "BLUEBERRY", "BROCCOLI", "CABBAGE", "MELON", "CARROT", "CASEIN", "CASHEWNUT", "CAULIFLOWER", "CELERY"     Note: Lab results reviewed.  Recent Diagnostic Imaging Review  Lumbosacral Imaging: Lumbar MR  wo contrast: Results for orders placed during the hospital encounter of 11/18/22 MR LUMBAR SPINE WO CONTRAST  Narrative CLINICAL DATA:  Low back pain extending into both legs for 1-2 years.  EXAM: MRI LUMBAR SPINE WITHOUT CONTRAST  TECHNIQUE: Multiplanar, multisequence MR imaging of the lumbar spine was performed. No intravenous contrast was administered.  COMPARISON:  None Available.  FINDINGS: Segmentation: Transitional anatomy with sacralization of the L5 vertebral body.  Alignment:  Physiologic.  Vertebrae: No acute fracture, evidence of discitis, or aggressive bone lesion.  Conus medullaris and cauda equina: Conus extends to the L1 level. Conus and cauda equina appear normal.  Paraspinal and other soft tissues: No acute paraspinal abnormality.  Disc levels:  Disc spaces: Disc desiccation throughout the lumbar spine. No significant disc height loss.  T12-L1: No significant disc bulge. No neural foraminal stenosis. No central canal stenosis. Mild bilateral facet arthropathy.  L1-L2: No significant disc bulge. No neural foraminal stenosis. No central canal stenosis. Mild bilateral facet arthropathy.  L2-L3: No significant disc bulge. No neural foraminal stenosis. No central canal stenosis. Mild-moderate bilateral facet arthropathy.  L3-L4: Mild broad-based disc bulge. Moderate bilateral facet arthropathy, right worse than left. Mild spinal stenosis. Mild right foraminal stenosis. No left foraminal stenosis.  L4-L5: Mild broad-based disc bulge. Moderate left and mild right facet arthropathy. No foraminal or central canal stenosis.  L5-S1: No significant disc bulge. No neural foraminal stenosis. No central canal stenosis.  IMPRESSION: 1. Mild lumbar spine spondylosis as described above. 2. No acute osseous injury of the lumbar spine.   Electronically Signed By: Elige Ko M.D. On: 11/20/2022 08:46  Lumbar DG Bending views: Results for orders placed  during the hospital encounter of 12/12/23 DG Lumbar Spine Complete W/Bend  Narrative CLINICAL DATA:  Low back pain.  EXAM: LUMBAR SPINE - COMPLETE WITH BENDING VIEWS  COMPARISON:  Lumbar MRI 11/18/2022  FINDINGS: L5 is sacralized with 4 true non rib-bearing lumbar vertebra. Small L5-S1 disc space. The alignment is normal. No change in alignment on flexion or extension. Vertebral body heights are normal. Moderate multilevel facet hypertrophy. Slight L3-L4 disc space narrowing. No visible pars defects. Aortic atherosclerosis.  IMPRESSION: 1. 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 2. Moderate multilevel facet hypertrophy. 3. Slight L3-L4 disc space narrowing.   Electronically Signed By: Narda Rutherford M.D. On: 12/20/2023 10:53  Hip Imaging: Hip-B DG Bilateral (5V): Results for orders placed during the hospital encounter of 12/12/23 DG HIPS BILAT W OR W/O PELVIS MIN 5 VIEWS  Narrative CLINICAL DATA:  Chronic bilateral hip pain.  EXAM: DG HIP (WITH OR WITHOUT PELVIS) 5+V BILAT  COMPARISON:  None Available.  FINDINGS: Mild right hip joint space narrowing. There is moderate bilateral acetabular spurring. No fracture. No evidence of erosion or avascular necrosis. No focal bone lesion or bone destruction. The pubic symphysis and sacroiliac joints are congruent, mild degenerative change. Bilateral tubal ligation clips in the pelvis. Bilateral lower extremity vascular stents.  IMPRESSION: Mild osteoarthritis of both hips, right greater than left.   Electronically Signed By: Narda Rutherford M.D. On: 12/20/2023 10:54  Complexity Note: Imaging results reviewed.                         Meds   Current Outpatient Medications:    acetaminophen (TYLENOL) 325 MG tablet, Take 2 tablets (650 mg total) by mouth every 6 (six) hours as needed for mild pain (or Fever >/= 101)., Disp: , Rfl:    ADVAIR DISKUS 250-50 MCG/ACT AEPB, Inhale 1 puff into the lungs  in the morning and at bedtime., Disp: , Rfl:    albuterol (PROVENTIL HFA;VENTOLIN HFA) 108 (90 Base) MCG/ACT inhaler, Inhale 2 puffs into the lungs every 4 (four) hours as needed for wheezing or shortness of breath., Disp: , Rfl:    amitriptyline (ELAVIL) 100 MG tablet, Take 10 mg by mouth at bedtime as needed for sleep., Disp: , Rfl:  aspirin EC 81 MG tablet, Take 1 tablet (81 mg total) by mouth daily. Swallow whole., Disp: 30 tablet, Rfl: 0   Aspirin-Salicylamide-Caffeine (BC HEADACHE POWDER PO), Take by mouth., Disp: , Rfl:    atorvastatin (LIPITOR) 10 MG tablet, Take 1 tablet (10 mg total) by mouth daily., Disp: 30 tablet, Rfl: 0   clopidogrel (PLAVIX) 75 MG tablet, Take 1 tablet (75 mg total) by mouth daily., Disp: 30 tablet, Rfl: 0   Continuous Blood Gluc Sensor (FREESTYLE LIBRE 2 SENSOR) MISC, USE AS DIRECTED EVERY 2 WEEKS, Disp: , Rfl:    FLUoxetine (PROZAC) 40 MG capsule, Take 1 capsule by mouth daily., Disp: , Rfl:    gabapentin (NEURONTIN) 400 MG capsule, Take 400 mg by mouth 3 (three) times daily., Disp: , Rfl:    glipiZIDE (GLUCOTROL) 10 MG tablet, Take 10 mg by mouth daily as needed., Disp: , Rfl:    guaiFENesin (MUCINEX) 600 MG 12 hr tablet, Take 1 tablet (600 mg total) by mouth 2 (two) times daily for 10 days., Disp: 20 tablet, Rfl: 0   insulin NPH-regular Human (70-30) 100 UNIT/ML injection, Inject 25-70 Units into the skin in the morning and at bedtime. 70 qam 25 qhs, Disp: , Rfl:    lactulose (CHRONULAC) 10 GM/15ML solution, SMARTSIG:Milliliter(s) By Mouth, Disp: , Rfl:    lisinopril-hydrochlorothiazide (ZESTORETIC) 20-25 MG tablet, Take 1 tablet by mouth daily., Disp: , Rfl:    oseltamivir (TAMIFLU) 75 MG capsule, Take 1 capsule (75 mg total) by mouth 2 (two) times daily for 3 days., Disp: 6 capsule, Rfl: 0   pantoprazole (PROTONIX) 40 MG tablet, Take 40 mg by mouth daily., Disp: , Rfl:    polyethylene glycol (MIRALAX / GLYCOLAX) 17 g packet, Take 17 g by mouth daily., Disp: 14  each, Rfl: 0   predniSONE (DELTASONE) 20 MG tablet, Take 2 tablets (40 mg total) by mouth daily with breakfast for 3 days., Disp: 6 tablet, Rfl: 0   tiZANidine (ZANAFLEX) 4 MG tablet, Take 2-4 mg by mouth every 12 (twelve) hours as needed for muscle spasms., Disp: , Rfl:    TRADJENTA 5 MG TABS tablet, Take 5 mg by mouth daily., Disp: , Rfl:    triamcinolone cream (KENALOG) 0.1 %, Apply topically., Disp: , Rfl:   ROS  Constitutional: Denies any fever or chills Gastrointestinal: No reported hemesis, hematochezia, vomiting, or acute GI distress Musculoskeletal: Denies any acute onset joint swelling, redness, loss of ROM, or weakness Neurological: No reported episodes of acute onset apraxia, aphasia, dysarthria, agnosia, amnesia, paralysis, loss of coordination, or loss of consciousness  Allergies  Ms. Litzinger is allergic to ibuprofen, nsaids, and percocet [oxycodone-acetaminophen].  PFSH  Drug: Ms. Espinola  reports that she does not currently use drugs. Alcohol:  reports no history of alcohol use. Tobacco:  reports that she has been smoking cigarettes. She has a 46 pack-year smoking history. She has never used smokeless tobacco. Medical:  has a past medical history of Asthma, Coronary artery disease, Diabetes mellitus without complication (HCC) (1990), Fibromyalgia, Hypertension, and PAD (peripheral artery disease) (HCC) (09/03/2022). Surgical: Ms. Marrone  has a past surgical history that includes Cesarean section; Breast mass excision (Left); Cholecystectomy (2009); Breast excisional biopsy (Left, 1980's); Lower Extremity Angiography (Left, 09/03/2022); Lower Extremity Angiography (Right, 09/08/2022); Lower Extremity Angiography (Left, 01/18/2023); Lower Extremity Angiography (Left, 02/14/2023); and Lower Extremity Angiography (Left, 06/27/2023). Family: family history includes Breast cancer in her paternal aunt; Breast cancer (age of onset: 21) in her sister; Cancer in her father,  mother, and sister;  Diabetes in her mother.  Constitutional Exam  General appearance: Well nourished, well developed, and well hydrated. In no apparent acute distress There were no vitals filed for this visit. BMI Assessment: Estimated body mass index is 40.43 kg/m as calculated from the following:   Height as of 12/29/23: 5' (1.524 m).   Weight as of 12/29/23: 207 lb (93.9 kg).  BMI interpretation table: BMI level Category Range association with higher incidence of chronic pain  <18 kg/m2 Underweight   18.5-24.9 kg/m2 Ideal body weight   25-29.9 kg/m2 Overweight Increased incidence by 20%  30-34.9 kg/m2 Obese (Class I) Increased incidence by 68%  35-39.9 kg/m2 Severe obesity (Class II) Increased incidence by 136%  >40 kg/m2 Extreme obesity (Class III) Increased incidence by 254%   Patient's current BMI Ideal Body weight  There is no height or weight on file to calculate BMI. Ideal body weight: 45.5 kg (100 lb 4.9 oz) Adjusted ideal body weight: 64.9 kg (142 lb 15.8 oz)   BMI Readings from Last 4 Encounters:  12/29/23 40.43 kg/m  12/12/23 40.43 kg/m  08/03/23 40.43 kg/m  06/27/23 40.43 kg/m   Wt Readings from Last 4 Encounters:  12/29/23 207 lb (93.9 kg)  12/12/23 207 lb (93.9 kg)  08/03/23 207 lb (93.9 kg)  06/27/23 207 lb (93.9 kg)    Psych/Mental status: Alert, oriented x 3 (person, place, & time)       Eyes: PERLA Respiratory: No evidence of acute respiratory distress  Assessment & Plan  Primary Diagnosis & Pertinent Problem List: The primary encounter diagnosis was Chronic low back pain (1ry area of Pain) (Bilateral) w/o sciatica. Diagnoses of Chronic hip pain (2ry area of Pain) (Bilateral), Chronic feet pain (3ry area of Pain) (Bilateral), Abnormal MRI, lumbar spine (11/20/2022), Lumbar facet joint pain, and Chronic anticoagulation (Plavix) were also pertinent to this visit. Visit Diagnosis: 1. Chronic low back pain (1ry area of Pain) (Bilateral) w/o sciatica   2. Chronic hip pain  (2ry area of Pain) (Bilateral)   3. Chronic feet pain (3ry area of Pain) (Bilateral)   4. Abnormal MRI, lumbar spine (11/20/2022)   5. Lumbar facet joint pain   6. Chronic anticoagulation (Plavix)    Problems updated and reviewed during this visit: No problems updated.  Plan of Care  Assessment and Plan           Pharmacotherapy (Medications Ordered): No orders of the defined types were placed in this encounter.  Procedure Orders    No procedure(s) ordered today   Lab Orders  No laboratory test(s) ordered today   Imaging Orders  No imaging studies ordered today   Referral Orders  No referral(s) requested today    Pharmacological management:  Opioid Analgesics: I will not be prescribing any opioids at this time Membrane stabilizer: I will not be prescribing any at this time Muscle relaxant: I will not be prescribing any at this time NSAID: I will not be prescribing any at this time Other analgesic(s): I will not be prescribing any at this time      Interventional Therapies  Risk Factors  Considerations  Medical Comorbidities:  Plavix Anticoagulation: (Stop: 7-10 days  Restart: 2 hours)  CAD  T2IDDM  BA  HTN  RBBB  Anxiety/Depression  FM  MO (BMI>40)     Planned  Pending:   Referral to neurology for lower extremity EMG/PNCV (diabetic peripheral neuropathy vs ischemic neuropathy)   Under consideration:   Diagnostic bilateral lumbar facet MBB #1  Completed:   None at this time   Therapeutic  Palliative (PRN) options:   None established   Completed by other providers:   Diagnostic bilateral L3-L5 lumbar facet MBB x1 (04/07/2023) by Filomena Jungling, MD [facility])  Diagnostic bilateral L5-S1 TFESI x2 (12/30/2022, 03/04/2023) by Filomena Jungling, MD [facility])      Provider-requested follow-up: No follow-ups on file. Recent Visits Date Type Provider Dept  12/12/23 Office Visit Delano Metz, MD [facility]  Showing recent visits  within past 90 days and meeting all other requirements Future Appointments Date Type Provider Dept  01/02/24 Appointment Delano Metz, MD [facility]  Showing future appointments within next 90 days and meeting all other requirements   Primary Care Physician: Center, [facility]  Duration of encounter: *** minutes.  Total time on encounter, as per AMA guidelines included both the face-to-face and non-face-to-face time personally spent by the physician and/or other qualified health care professional(s) on the day of the encounter (includes time in activities that require the physician or other qualified health care professional and does not include time in activities normally performed by clinical staff). Physician's time may include the following activities when performed: Preparing to see the patient (e.g., pre-charting review of records, searching for previously ordered imaging, lab work, and nerve conduction tests) Review of prior analgesic pharmacotherapies. Reviewing PMP Interpreting ordered tests (e.g., lab work, imaging, nerve conduction tests) Performing post-procedure evaluations, including interpretation of diagnostic procedures Obtaining and/or reviewing separately obtained history Performing a medically appropriate examination and/or evaluation Counseling and educating the patient/family/caregiver Ordering medications, tests, or procedures Referring and communicating with other health care professionals (when not separately reported) Documenting clinical information in the electronic or other health record Independently interpreting results (not separately reported) and communicating results to the patient/ family/caregiver Care coordination (not separately reported)  Note by: Oswaldo Done, MD (TTS technology used. I apologize for any typographical errors that were not detected and corrected.) Date: 01/02/2024; Time: 3:32 PM

## 2023-12-29 NOTE — H&P (Signed)
History and Physical    Patient: Kristie Olson NGE:[id number] DOB: 05-19-1956 DOA: 12/29/2023 DOS: the patient was seen and examined on 12/29/2023 PCP: Center, [facility]  Patient coming from: Home  Chief Complaint:  Chief Complaint  Patient presents with   Cough   Generalized Body Aches    HPI: Kristie Olson is a 68 y.o. female with medical history significant for Insulin-dependent type 2 DM, CAD, HTN and PVD, tobacco use disorder, asthma and recurrent pancreatitis, who presents to the ED with a 4-day history of cough and congestion fever and now shortness of breath and wheezing.  She denies chest pain, lower extremity pain or swelling. ED course: Afebrile, heart rate up to the 120s, tachypneic to 25, initially requiring 2 L to maintain sats in the mid 90s with desaturations to 88 when O2 is removed even following treatment. Influenza A positive with normal CBC.  BMP notable for potassium 2.9.  Chest x-ray nonacute  Patient treated with DuoNebs and methylprednisolone but continued to desat when O2 was removed, improving with O2 at 2 L. Hospitalist consulted for admission.   Review of Systems: As mentioned in the history of present illness. All other systems reviewed and are negative.  Past Medical History:  Diagnosis Date   Asthma    Coronary artery disease    Diabetes mellitus without complication (HCC) 1990   Fibromyalgia    Hypertension    PAD (peripheral artery disease) (HCC) 09/03/2022   Past Surgical History:  Procedure Laterality Date   BREAST EXCISIONAL BIOPSY Left 1980's   neg   BREAST MASS EXCISION Left    age 61"s, benign   CESAREAN SECTION     CHOLECYSTECTOMY  2009   LOWER EXTREMITY ANGIOGRAPHY Left 09/03/2022   Procedure: Lower Extremity Angiography;  Surgeon: Annice Needy, MD;  Location: [facility] INVASIVE CV LAB;  Service: Cardiovascular;  Laterality: Left;   LOWER EXTREMITY ANGIOGRAPHY Right 09/08/2022   Procedure: Lower Extremity Angiography;   Surgeon: Annice Needy, MD;  Location: [facility] INVASIVE CV LAB;  Service: Cardiovascular;  Laterality: Right;   LOWER EXTREMITY ANGIOGRAPHY Left 01/18/2023   Procedure: Lower Extremity Angiography;  Surgeon: Renford Dills, MD;  Location: [facility] INVASIVE CV LAB;  Service: Cardiovascular;  Laterality: Left;   LOWER EXTREMITY ANGIOGRAPHY Left 02/14/2023   Procedure: Lower Extremity Angiography;  Surgeon: Annice Needy, MD;  Location: [facility] INVASIVE CV LAB;  Service: Cardiovascular;  Laterality: Left;   LOWER EXTREMITY ANGIOGRAPHY Left 06/27/2023   Procedure: Lower Extremity Angiography;  Surgeon: Annice Needy, MD;  Location: [facility] INVASIVE CV LAB;  Service: Cardiovascular;  Laterality: Left;   Social History:  reports that she has been smoking cigarettes. She has a 46 pack-year smoking history. She has never used smokeless tobacco. She reports that she does not currently use drugs. She reports that she does not drink alcohol.  Allergies  Allergen Reactions   Ibuprofen Other (See Comments)    STOMACHACHE   Nsaids     Other reaction(s): Other (See Comments) PUD   Percocet [Oxycodone-Acetaminophen] Itching    Patient states "Percocet makes me itch all over"    Family History  Problem Relation Age of Onset   Cancer Mother    Diabetes Mother    Cancer Father    Cancer Sister    Breast cancer Sister 62   Breast cancer Paternal Aunt     Prior to Admission medications   Medication Sig Start Date End Date Taking? Authorizing Provider  acetaminophen (TYLENOL)  325 MG tablet Take 2 tablets (650 mg total) by mouth every 6 (six) hours as needed for mild pain (or Fever >/= 101). 09/10/22   Leeroy Bock, MD  ADVAIR DISKUS 250-50 MCG/ACT AEPB Inhale 1 puff into the lungs in the morning and at bedtime. 04/19/23   [provider]  albuterol (PROVENTIL HFA;VENTOLIN HFA) 108 (90 Base) MCG/ACT inhaler Inhale 2 puffs into the lungs every 4 (four) hours as needed for wheezing or shortness of breath.     [provider]  amitriptyline (ELAVIL) 100 MG tablet Take 10 mg by mouth at bedtime as needed for sleep.    [provider]  aspirin EC 81 MG tablet Take 1 tablet (81 mg total) by mouth daily. Swallow whole. 09/11/22   Leeroy Bock, MD  Aspirin-Salicylamide-Caffeine (BC HEADACHE POWDER PO) Take by mouth.    [provider]  atorvastatin (LIPITOR) 10 MG tablet Take 1 tablet (10 mg total) by mouth daily. 09/10/22 10/10/22  Leeroy Bock, MD  clopidogrel (PLAVIX) 75 MG tablet Take 1 tablet (75 mg total) by mouth daily. 06/21/23   Georgiana Spinner, NP  Continuous Blood Gluc Sensor (FREESTYLE LIBRE 2 SENSOR) MISC USE AS DIRECTED EVERY 2 WEEKS 12/30/22   [provider]  esomeprazole (NEXIUM) 20 MG capsule Take 20 mg by mouth daily at 12 noon.    [provider]  FLUoxetine (PROZAC) 40 MG capsule Take 1 capsule by mouth daily.    [provider]  glipiZIDE (GLUCOTROL) 10 MG tablet Take 10 mg by mouth daily as needed.    [provider]  insulin NPH-regular Human (70-30) 100 UNIT/ML injection Inject 25-70 Units into the skin in the morning and at bedtime. 70 qam 25 qhs 08/24/12   [provider]  lisinopril-hydrochlorothiazide (ZESTORETIC) 20-25 MG tablet Take 1 tablet by mouth daily. 08/31/22   [provider]  pantoprazole (PROTONIX) 40 MG tablet Take 40 mg by mouth daily. 04/19/23   [provider]  tiZANidine (ZANAFLEX) 4 MG tablet Take 2-4 mg by mouth every 12 (twelve) hours as needed for muscle spasms. 01/12/23   [provider]  triamcinolone cream (KENALOG) 0.1 % Apply topically. 12/13/22   [provider]    Physical Exam: Vitals:   12/29/23 1800 12/29/23 1815 12/29/23 1951 12/29/23 2010  BP:   (!) 154/65   Pulse: (!) 126 (!) 116 (!) 119   Resp:   (!) 25   Temp:    98 F (36.7 C)  TempSrc:    Oral  SpO2: 90% 91% 99%   Weight:      Height:       Physical Exam Vitals and  nursing note reviewed.  Constitutional:      General: She is not in acute distress.    Interventions: Nasal cannula in place.  HENT:     Head: Normocephalic and atraumatic.  Cardiovascular:     Rate and Rhythm: Regular rhythm. Tachycardia present.     Heart sounds: Normal heart sounds.  Pulmonary:     Effort: Tachypnea present.     Breath sounds: Wheezing present.  Abdominal:     Palpations: Abdomen is soft.     Tenderness: There is no abdominal tenderness.  Neurological:     Mental Status: Mental status is at baseline.     Labs on Admission: I have personally reviewed following labs and imaging studies  CBC: Recent Labs  Lab 12/29/23 1937  WBC 4.9  4.8  HGB  13.2  13.2  HCT 39.1  39.9  MCV 92.2  93.7  PLT 389  384   Basic Metabolic Panel: Recent Labs  Lab 12/29/23 1937  NA 138  K 2.9*  CL 97*  CO2 29  GLUCOSE 166*  BUN 10  CREATININE 0.78  0.73  CALCIUM 9.3   GFR: Estimated Creatinine Clearance: 68 mL/min (by C-G formula based on SCr of 0.78 mg/dL). Liver Function Tests: No results for input(s): "AST", "ALT", "ALKPHOS", "BILITOT", "PROT", "ALBUMIN" in the last 168 hours. No results for input(s): "LIPASE", "AMYLASE" in the last 168 hours. No results for input(s): "AMMONIA" in the last 168 hours. Coagulation Profile: No results for input(s): "INR", "PROTIME" in the last 168 hours. Cardiac Enzymes: No results for input(s): "CKTOTAL", "CKMB", "CKMBINDEX", "TROPONINI" in the last 168 hours. BNP (last 3 results) No results for input(s): "PROBNP" in the last 8760 hours. HbA1C: No results for input(s): "HGBA1C" in the last 72 hours. CBG: No results for input(s): "GLUCAP" in the last 168 hours. Lipid Profile: No results for input(s): "CHOL", "HDL", "LDLCALC", "TRIG", "CHOLHDL", "LDLDIRECT" in the last 72 hours. Thyroid Function Tests: No results for input(s): "TSH", "T4TOTAL", "FREET4", "T3FREE", "THYROIDAB" in the last 72 hours. Anemia Panel: No results  for input(s): "VITAMINB12", "FOLATE", "FERRITIN", "TIBC", "IRON", "RETICCTPCT" in the last 72 hours. Urine analysis:    Component Value Date/Time   COLORURINE AMBER (A) 02/14/2023 1719   APPEARANCEUR HAZY (A) 02/14/2023 1719   APPEARANCEUR Clear 07/14/2013 2143   LABSPEC >1.046 (H) 02/14/2023 1719   LABSPEC 1.025 07/14/2013 2143   PHURINE 5.0 02/14/2023 1719   GLUCOSEU NEGATIVE 02/14/2023 1719   GLUCOSEU >=500 07/14/2013 2143   HGBUR LARGE (A) 02/14/2023 1719   BILIRUBINUR NEGATIVE 02/14/2023 1719   BILIRUBINUR Negative 07/14/2013 2143   KETONESUR NEGATIVE 02/14/2023 1719   PROTEINUR 100 (A) 02/14/2023 1719   NITRITE POSITIVE (A) 02/14/2023 1719   LEUKOCYTESUR SMALL (A) 02/14/2023 1719   LEUKOCYTESUR 1+ 07/14/2013 2143    Radiological Exams on Admission: DG Chest 2 View Result Date: 12/29/2023 CLINICAL DATA:  Shortness of breath EXAM: CHEST - 2 VIEW COMPARISON:  X-ray 04/28/2020 FINDINGS: No consolidation, pneumothorax or effusion. No edema. Normal cardiopericardial silhouette. Calcified aorta. Dense nodule overlying the left hemithorax was seen as a breast calcification on old CT scan. Degenerative changes along the spine. IMPRESSION: No acute cardiopulmonary disease. Electronically Signed   By: Karen Kays M.D.   On: 12/29/2023 17:14     Data Reviewed: Relevant notes from primary care and specialist visits, past discharge summaries as available in EHR, including Care Everywhere. Prior diagnostic testing as pertinent to current admission diagnoses Updated medications and problem lists for reconciliation ED course, including vitals, labs, imaging, treatment and response to treatment Triage notes, nursing and pharmacy notes and ED provider's notes Notable results as noted in HPI   Assessment and Plan: * Influenza A Acute bronchitis with acute exacerbation of asthma Acute respiratory failure with hypoxia Schedule and as needed nebulized bronchodilators Oral  steroids Tamiflu Supplemental oxygen to keep sats over 92%  Obesity, Class III, BMI 40-49.9 (morbid obesity) (HCC) Complicating factor to overall prognosis and care  Tobacco use disorder Nicotine patch  PAD (peripheral artery disease) (HCC) Continue Plavix and atorvastatin  Insulin dependent type 2 diabetes mellitus (HCC) Controlled  basal insulin with sliding scale coverage  Benign essential hypertension For the most part controlled Continue lisinopril HCTZ  Depression Continue fluoxetine and amitriptyline     DVT prophylaxis: Lovenox  Consults: none  Advance Care Planning:   Code Status: Full Code   Family Communication: husband at bedside  Disposition Plan: Back to previous home environment  Severity of Illness: The appropriate patient status for this patient is INPATIENT. Inpatient status is judged to be reasonable and necessary in order to provide the required intensity of service to ensure the patient's safety. The patient's presenting symptoms, physical exam findings, and initial radiographic and laboratory data in the context of their chronic comorbidities is felt to place them at high risk for further clinical deterioration. Furthermore, it is not anticipated that the patient will be medically stable for discharge from the hospital within 2 midnights of admission.   * I certify that at the point of admission it is my clinical judgment that the patient will require inpatient hospital care spanning beyond 2 midnights from the point of admission due to high intensity of service, high risk for further deterioration and high frequency of surveillance required.*  Author: Andris Baumann, MD 12/29/2023 9:46 PM  For on call review [email or web address].

## 2023-12-29 NOTE — ED Notes (Signed)
O2 turned off per Dr. Lenard Lance to check room air sats.

## 2023-12-29 NOTE — Assessment & Plan Note (Signed)
Acute bronchitis with acute exacerbation of asthma Acute respiratory failure with hypoxia Schedule and as needed nebulized bronchodilators Oral steroids Tamiflu Supplemental oxygen to keep sats over 92%

## 2023-12-29 NOTE — ED Notes (Signed)
CCMD called and pt placed on tele.

## 2023-12-29 NOTE — ED Notes (Signed)
Report given to Corena Pilgrim RN

## 2023-12-29 NOTE — Assessment & Plan Note (Signed)
For the most part controlled Continue lisinopril HCTZ

## 2023-12-29 NOTE — ED Notes (Signed)
First Nurse Note: Pt to ED via [facility] from home for flu like symptoms. Pt was given a Duoneb and albuterol treatment by EMS in route. Pt son also sick with similar symptoms. Pt is in NAD.   BP- 137/105 SpO2- 99% HR- 95 Temp- 99.5

## 2023-12-29 NOTE — ED Triage Notes (Signed)
See First Nurse note.  Pt c/o cough, congestion, and generalized body aches x1 week.  Hx of asthma.    Son has been sick w/ similar symptoms.

## 2023-12-29 NOTE — Assessment & Plan Note (Signed)
Nicotine patch

## 2023-12-29 NOTE — ED Provider Triage Note (Signed)
Emergency Medicine Provider Triage Evaluation Note  Kristie Olson , a 68 y.o. female  was evaluated in triage.  Pt complains of wheezing and cough x 1 week. Son has similar symptoms. Hx of asthma.   Review of Systems  Positive:  Negative:   Physical Exam  SpO2 92%  Gen:   Awake, no distress   Resp:  Normal effort audible expiratory wheezing MSK:   Moves extremities without difficulty  Other:    Medical Decision Making  Medically screening exam initiated at 3:39 PM.  Appropriate orders placed.  Kristie Olson was informed that the remainder of the evaluation will be completed by another provider, this initial triage assessment does not replace that evaluation, and the importance of remaining in the ED until their evaluation is complete.    Romeo Apple, Maren Wiesen A, PA-C 12/29/23 1539

## 2023-12-29 NOTE — Assessment & Plan Note (Signed)
Controlled  basal insulin with sliding scale coverage

## 2023-12-29 NOTE — Assessment & Plan Note (Signed)
Complicating factor to overall prognosis and care

## 2023-12-29 NOTE — Assessment & Plan Note (Signed)
Continue Plavix and atorvastatin

## 2023-12-30 ENCOUNTER — Encounter: Payer: Self-pay | Admitting: Internal Medicine

## 2023-12-30 ENCOUNTER — Other Ambulatory Visit: Payer: Self-pay

## 2023-12-30 DIAGNOSIS — J101 Influenza due to other identified influenza virus with other respiratory manifestations: Secondary | ICD-10-CM | POA: Diagnosis not present

## 2023-12-30 LAB — BASIC METABOLIC PANEL
Anion gap: 10 (ref 5–15)
BUN: 14 mg/dL (ref 8–23)
CO2: 29 mmol/L (ref 22–32)
Calcium: 9.3 mg/dL (ref 8.9–10.3)
Chloride: 100 mmol/L (ref 98–111)
Creatinine, Ser: 0.8 mg/dL (ref 0.44–1.00)
GFR, Estimated: >60 mL/min (ref >60)
Glucose, Bld: 299 mg/dL — ABNORMAL HIGH (ref 70–99)
Potassium: 4.4 mmol/L (ref 3.5–5.1)
Sodium: 139 mmol/L (ref 135–145)

## 2023-12-30 LAB — CBC
HCT: 40.3 % (ref 36.0–46.0)
Hemoglobin: 13.6 g/dL (ref 12.0–15.0)
MCH: 31.3 pg (ref 26.0–34.0)
MCHC: 33.7 g/dL (ref 30.0–36.0)
MCV: 92.9 fL (ref 80.0–100.0)
Platelets: 393 10*3/uL (ref 150–400)
RBC: 4.34 MIL/uL (ref 3.87–5.11)
RDW: 13.6 % (ref 11.5–15.5)
WBC: 5.2 10*3/uL (ref 4.0–10.5)
nRBC: 0 % (ref 0.0–0.2)

## 2023-12-30 LAB — GLUCOSE, CAPILLARY: Glucose-Capillary: 236 mg/dL — ABNORMAL HIGH (ref 70–99)

## 2023-12-30 LAB — HIV ANTIBODY (ROUTINE TESTING W REFLEX): HIV Screen 4th Generation wRfx: NONREACTIVE

## 2023-12-30 LAB — CBG MONITORING, ED
Glucose-Capillary: 259 mg/dL — ABNORMAL HIGH (ref 70–99)
Glucose-Capillary: 410 mg/dL — ABNORMAL HIGH (ref 70–99)

## 2023-12-30 LAB — GLUCOSE, RANDOM: Glucose, Bld: 411 mg/dL — ABNORMAL HIGH (ref 70–99)

## 2023-12-30 LAB — HEMOGLOBIN A1C
Hgb A1c MFr Bld: 9.6 % — ABNORMAL HIGH (ref 4.8–5.6)
Mean Plasma Glucose: 228.82 mg/dL

## 2023-12-30 MED ORDER — LIVING WELL WITH DIABETES BOOK
Freq: Once | Status: DC
  Filled 2023-12-30 (×3): qty 1

## 2023-12-30 MED ORDER — GUAIFENESIN ER 600 MG PO TB12
600.0000 mg | ORAL_TABLET | Freq: Two times a day (BID) | ORAL | Status: DC
  Filled 2023-12-30: qty 1

## 2023-12-30 MED ORDER — INSULIN GLARGINE-YFGN 100 UNIT/ML ~~LOC~~ SOLN
10.0000 [IU] | Freq: Once | SUBCUTANEOUS | Status: AC
  Administered 2023-12-30: 10 [IU] via SUBCUTANEOUS
  Filled 2023-12-30: qty 0.1

## 2023-12-30 MED ORDER — POLYETHYLENE GLYCOL 3350 17 G PO PACK
17.0000 g | PACK | Freq: Every day | ORAL | Status: DC
  Administered 2023-12-30 – 2023-12-31 (×2): 17 g via ORAL
  Filled 2023-12-30 (×2): qty 1

## 2023-12-30 NOTE — ED Notes (Signed)
This RN notified Rosezetta Schlatter, MD of [facility] of 410.

## 2023-12-30 NOTE — Progress Notes (Signed)
Progress Note   Patient: Kristie Olson ZHY:[id number] DOB: 01/29/1956 DOA: 12/29/2023     1 DOS: the patient was seen and examined on 12/30/2023   Brief hospital course: From HPI "Kristie Olson is a 68 y.o. female with medical history significant for Insulin-dependent type 2 DM, CAD, HTN and PVD, tobacco use disorder, asthma and recurrent pancreatitis, who presents to the ED with a 4-day history of cough and congestion fever and now shortness of breath and wheezing.  She denies chest pain, lower extremity pain or swelling. ED course: Afebrile, heart rate up to the 120s, tachypneic to 25, initially requiring 2 L to maintain sats in the mid 90s with desaturations to 88 when O2 is removed even following treatment. Influenza A positive with normal CBC.  BMP notable for potassium 2.9.   Chest x-ray nonacute   Patient treated with DuoNebs and methylprednisolone but continued to desat when O2 was removed, improving with O2 at 2 L. Hospitalist consulted for admission.  "  Assessment and Plan:  Influenza A Acute bronchitis with acute exacerbation of asthma Acute respiratory failure with hypoxia Schedule and as needed nebulized bronchodilators Continue steroid therapy Continue Tamiflu Continue oxygen requirement and wean down as tolerated   Obesity, Class III, BMI 40-49.9 (morbid obesity) (HCC) Complicating factor to overall prognosis and care   Tobacco use disorder Continue nicotine patch   PAD (peripheral artery disease) (HCC) Continue Plavix and atorvastatin   Insulin dependent type 2 diabetes mellitus (HCC) Controlled  Continue basal insulin with sliding scale coverage   Benign essential hypertension For the most part controlled Continue lisinopril HCTZ   Depression Continue fluoxetine and amitriptyline    DVT prophylaxis: Lovenox   Consults: none   Advance Care Planning:   Code Status: Full Code    Family Communication: husband at bedside   Disposition Plan: Back  to previous home environment  Subjective:  Patient seen and examined at bedside this morning Still requiring 2 L of intranasal oxygen Denies nausea vomiting abdominal pain chest pain  Admits to persistent cough   Physical Exam:  Vitals and nursing note reviewed.  Constitutional:      General: She is not in acute distress.    Interventions: Nasal cannula in place.  HENT:     Head: Normocephalic and atraumatic.  Cardiovascular:     Rate and Rhythm: Regular rhythm.     Heart sounds: Normal heart sounds.  Pulmonary: Having wheezing bilaterally Abdominal:     Palpations: Abdomen is soft.     Tenderness: There is no abdominal tenderness.  Neurological:     Mental Status: Mental status is at baseline.    Vitals:   12/30/23 1130 12/30/23 1430 12/30/23 1500 12/30/23 1530  BP: (!) 176/75 128/63 (!) 167/80 (!) 105/90  Pulse: (!) 116 98 (!) 111 98  Resp: (!) 21 20 (!) 31 18  Temp:      TempSrc:      SpO2: 97% 98% 98% 99%  Weight:      Height:        Data Reviewed:    Latest Ref Rng & Units 12/30/2023   12:59 PM 12/30/2023    6:37 AM 12/29/2023    7:37 PM  BMP  Glucose 70 - 99 mg/dL 295  284  132   BUN 8 - 23 mg/dL  14  10   Creatinine 4.40 - 1.00 mg/dL  1.02  7.25    3.66   Sodium 135 - 145 mmol/L  139  138   Potassium 3.5 - 5.1 mmol/L  4.4  2.9   Chloride 98 - 111 mmol/L  100  97   CO2 22 - 32 mmol/L  29  29   Calcium 8.9 - 10.3 mg/dL  9.3  9.3   I have reviewed patient's chest x-ray that did not show any findings of consolidation    Latest Ref Rng & Units 12/30/2023    6:37 AM 12/29/2023    7:37 PM 05/23/2023    2:44 AM  CBC  WBC 4.0 - 10.5 K/uL 5.2  4.8    4.9  9.9   Hemoglobin 12.0 - 15.0 g/dL 91.4  78.2    95.6  21.3   Hematocrit 36.0 - 46.0 % 40.3  39.9    39.1  43.0   Platelets 150 - 400 K/uL 393  384    389  419     Author: Loyce Dys, MD 12/30/2023 3:41 PM  For on call review [email or web address].

## 2023-12-30 NOTE — Inpatient Diabetes Management (Signed)
Inpatient Diabetes Program Recommendations  AACE/ADA: New Consensus Statement on Inpatient Glycemic Control (2015)  Target Ranges:  Prepandial:   less than 140 mg/dL      Peak postprandial:   less than 180 mg/dL (1-2 hours)      Critically ill patients:  140 - 180 mg/dL   Lab Results  Component Value Date   GLUCAP 259 (H) 12/30/2023   HGBA1C 9.6 (H) 12/29/2023    Review of Glycemic Control  Latest Reference Range & Units 12/30/23 07:26  Glucose-Capillary 70 - 99 mg/dL 409 (H)  (H): Data is abnormally high  Diabetes history: DM2 Outpatient Diabetes medications: 70/30 70 units QAM, 25 units QPM, Glipizide 10 mg every day, Tradjenta 5 mg QD Current orders for Inpatient glycemic control: Semglee 20 every day, Novolog 0-20 units TID and 0-5 at bedtime, Prednisone 40 mg QD  Inpatient Diabetes Program Recommendations:    Might consider:  Semglee 20 units BID  Will continue to follow while inpatient.  Thank you, Dulce Sellar, MSN, CDCES Diabetes Coordinator Inpatient Diabetes Program [phone] (team pager from 8a-5p)

## 2023-12-31 DIAGNOSIS — J101 Influenza due to other identified influenza virus with other respiratory manifestations: Secondary | ICD-10-CM | POA: Diagnosis not present

## 2023-12-31 LAB — CBC WITH DIFFERENTIAL/PLATELET
Abs Immature Granulocytes: 0.02 10*3/uL (ref 0.00–0.07)
Basophils Absolute: 0 10*3/uL (ref 0.0–0.1)
Basophils Relative: 0 %
Eosinophils Absolute: 0 10*3/uL (ref 0.0–0.5)
Eosinophils Relative: 0 %
HCT: 38.1 % (ref 36.0–46.0)
Hemoglobin: 13.1 g/dL (ref 12.0–15.0)
Immature Granulocytes: 0 %
Lymphocytes Relative: 39 %
Lymphs Abs: 3 10*3/uL (ref 0.7–4.0)
MCH: 31.3 pg (ref 26.0–34.0)
MCHC: 34.4 g/dL (ref 30.0–36.0)
MCV: 91.1 fL (ref 80.0–100.0)
Monocytes Absolute: 0.7 10*3/uL (ref 0.1–1.0)
Monocytes Relative: 9 %
Neutro Abs: 3.9 10*3/uL (ref 1.7–7.7)
Neutrophils Relative %: 52 %
Platelets: 393 10*3/uL (ref 150–400)
RBC: 4.18 MIL/uL (ref 3.87–5.11)
RDW: 13.6 % (ref 11.5–15.5)
WBC: 7.6 10*3/uL (ref 4.0–10.5)
nRBC: 0 % (ref 0.0–0.2)

## 2023-12-31 LAB — BASIC METABOLIC PANEL
Anion gap: 9 (ref 5–15)
BUN: 15 mg/dL (ref 8–23)
CO2: 30 mmol/L (ref 22–32)
Calcium: 9.1 mg/dL (ref 8.9–10.3)
Chloride: 99 mmol/L (ref 98–111)
Creatinine, Ser: 0.75 mg/dL (ref 0.44–1.00)
GFR, Estimated: >60 mL/min (ref >60)
Glucose, Bld: 138 mg/dL — ABNORMAL HIGH (ref 70–99)
Potassium: 3.3 mmol/L — ABNORMAL LOW (ref 3.5–5.1)
Sodium: 138 mmol/L (ref 135–145)

## 2023-12-31 LAB — GLUCOSE, CAPILLARY
Glucose-Capillary: 118 mg/dL — ABNORMAL HIGH (ref 70–99)
Glucose-Capillary: 219 mg/dL — ABNORMAL HIGH (ref 70–99)
Glucose-Capillary: 92 mg/dL (ref 70–99)

## 2023-12-31 LAB — MAGNESIUM: Magnesium: 1.9 mg/dL (ref 1.7–2.4)

## 2023-12-31 MED ORDER — POTASSIUM CHLORIDE CRYS ER 20 MEQ PO TBCR
40.0000 meq | EXTENDED_RELEASE_TABLET | ORAL | Status: AC
  Administered 2023-12-31 (×2): 40 meq via ORAL
  Filled 2023-12-31 (×2): qty 2

## 2023-12-31 MED ORDER — GUAIFENESIN ER 600 MG PO TB12: 600.0000 mg | ORAL_TABLET | Freq: Two times a day (BID) | ORAL | 0 refills | Status: AC

## 2023-12-31 MED ORDER — OSELTAMIVIR PHOSPHATE 75 MG PO CAPS: 75.0000 mg | ORAL_CAPSULE | Freq: Two times a day (BID) | ORAL | 0 refills | Status: AC

## 2023-12-31 MED ORDER — PREDNISONE 20 MG PO TABS: 40.0000 mg | ORAL_TABLET | Freq: Every day | ORAL | 0 refills | Status: AC

## 2023-12-31 MED ORDER — POLYETHYLENE GLYCOL 3350 17 G PO PACK: 17.0000 g | PACK | Freq: Every day | ORAL | 0 refills | Status: DC

## 2023-12-31 NOTE — Discharge Summary (Signed)
Physician Discharge Summary   Patient: Kristie Olson MRN: [id number] DOB: 18-Apr-1956  Admit date:     12/29/2023  Discharge date: 12/31/23  Discharge Physician: Loyce Dys   PCP: [facility]   Recommendations at discharge:  Follow-up with primary care physician  Discharge Diagnoses: Influenza A Acute bronchitis with acute exacerbation of asthma Acute respiratory failure with hypoxia Obesity, Class III, BMI 40-49.9 (morbid obesity) (HCC) Tobacco use disorder PAD (peripheral artery disease) (HCC) Insulin dependent type 2 diabetes mellitus (HCC) Benign essential hypertension Depression     Hospital Course: Kristie Olson is a 68 y.o. female with medical history significant for Insulin-dependent type 2 DM, CAD, HTN and PVD, tobacco use disorder, asthma and recurrent pancreatitis, who presents to the ED with a 4-day history of cough and congestion fever and now shortness of breath and wheezing.   Influenza A positive  Chest x-ray nonacute.  Patient initially required oxygen and was later weaned off.  Patient is now cleared for discharge today to follow-up with PCP  Consultants: None Procedures performed: None Disposition: Home Diet recommendation:  Cardiac diet DISCHARGE MEDICATION: Allergies as of 12/31/2023       Reactions   Ibuprofen Other (See Comments)   STOMACHACHE   Nsaids    Other reaction(s): Other (See Comments) PUD   Percocet [oxycodone-acetaminophen] Itching   Patient states "Percocet makes me itch all over"        Medication List     STOP taking these medications    esomeprazole 20 MG capsule Commonly known as: NEXIUM       TAKE these medications    acetaminophen 325 MG tablet Commonly known as: TYLENOL Take 2 tablets (650 mg total) by mouth every 6 (six) hours as needed for mild pain (or Fever >/= 101).   Advair Diskus 250-50 MCG/ACT Aepb Generic drug: fluticasone-salmeterol Inhale 1 puff into the lungs in the  morning and at bedtime.   albuterol 108 (90 Base) MCG/ACT inhaler Commonly known as: VENTOLIN HFA Inhale 2 puffs into the lungs every 4 (four) hours as needed for wheezing or shortness of breath.   amitriptyline 100 MG tablet Commonly known as: ELAVIL Take 10 mg by mouth at bedtime as needed for sleep.   aspirin EC 81 MG tablet Take 1 tablet (81 mg total) by mouth daily. Swallow whole.   atorvastatin 10 MG tablet Commonly known as: LIPITOR Take 1 tablet (10 mg total) by mouth daily.   BC HEADACHE POWDER PO Take by mouth.   clopidogrel 75 MG tablet Commonly known as: PLAVIX Take 1 tablet (75 mg total) by mouth daily.   FLUoxetine 40 MG capsule Commonly known as: PROZAC Take 1 capsule by mouth daily.   FreeStyle Libre 2 Sensor Misc USE AS DIRECTED EVERY 2 WEEKS   gabapentin 400 MG capsule Commonly known as: NEURONTIN Take 400 mg by mouth 3 (three) times daily.   glipiZIDE 10 MG tablet Commonly known as: GLUCOTROL Take 10 mg by mouth daily as needed.   guaiFENesin 600 MG 12 hr tablet Commonly known as: MUCINEX Take 1 tablet (600 mg total) by mouth 2 (two) times daily for 10 days.   insulin NPH-regular Human (70-30) 100 UNIT/ML injection Inject 25-70 Units into the skin in the morning and at bedtime. 70 qam 25 qhs   lactulose 10 GM/15ML solution Commonly known as: CHRONULAC SMARTSIG:Milliliter(s) By Mouth   lisinopril-hydrochlorothiazide 20-25 MG tablet Commonly known as: ZESTORETIC Take 1 tablet by mouth daily.   oseltamivir  75 MG capsule Commonly known as: TAMIFLU Take 1 capsule (75 mg total) by mouth 2 (two) times daily for 3 days.   pantoprazole 40 MG tablet Commonly known as: PROTONIX Take 40 mg by mouth daily.   polyethylene glycol 17 g packet Commonly known as: MIRALAX / GLYCOLAX Take 17 g by mouth daily. Start taking on: January 01, 2024   predniSONE 20 MG tablet Commonly known as: DELTASONE Take 2 tablets (40 mg total) by mouth daily with  breakfast for 3 days. Start taking on: January 01, 2024   tiZANidine 4 MG tablet Commonly known as: ZANAFLEX Take 2-4 mg by mouth every 12 (twelve) hours as needed for muscle spasms. What changed: Another medication with the same name was removed. Continue taking this medication, and follow the directions you see here.   Tradjenta 5 MG Tabs tablet Generic drug: linagliptin Take 5 mg by mouth daily.   triamcinolone cream 0.1 % Commonly known as: KENALOG Apply topically.        Follow-up Information     [facility].   Specialty: General Practice Why: As needed Contact information: [address] [place] [address] [phone]                Discharge Exam: Kristie Olson Weights   12/29/23 1540  Weight: 93.9 kg   Vitals and nursing note reviewed.  Constitutional:      General: She is not in acute distress.    Interventions: Nasal cannula in place.  HENT:     Head: Normocephalic and atraumatic.  Cardiovascular:     Rate and Rhythm: Regular rhythm.     Heart sounds: Normal heart sounds.  Pulmonary: Having wheezing bilaterally Abdominal:     Palpations: Abdomen is soft.     Tenderness: There is no abdominal tenderness.  Neurological:     Mental Status: Mental status is at baseline.   Condition at discharge: good  The results of significant diagnostics from this hospitalization (including imaging, microbiology, ancillary and laboratory) are listed below for reference.   Imaging Studies: DG Chest 2 View Result Date: 12/29/2023 CLINICAL DATA:  Shortness of breath EXAM: CHEST - 2 VIEW COMPARISON:  X-ray 04/28/2020 FINDINGS: No consolidation, pneumothorax or effusion. No edema. Normal cardiopericardial silhouette. Calcified aorta. Dense nodule overlying the left hemithorax was seen as a breast calcification on old CT scan. Degenerative changes along the spine. IMPRESSION: No acute cardiopulmonary disease. Electronically Signed   By: Karen Kays M.D.   On: 12/29/2023 17:14   DG HIPS BILAT W OR W/O PELVIS MIN 5 VIEWS Result Date: 12/20/2023 CLINICAL DATA:  Chronic bilateral hip pain. EXAM: DG HIP (WITH OR WITHOUT PELVIS) 5+V BILAT COMPARISON:  None Available. FINDINGS: Mild right hip joint space narrowing. There is moderate bilateral acetabular spurring. No fracture. No evidence of erosion or avascular necrosis. No focal bone lesion or bone destruction. The pubic symphysis and sacroiliac joints are congruent, mild degenerative change. Bilateral tubal ligation clips in the pelvis. Bilateral lower extremity vascular stents. IMPRESSION: Mild osteoarthritis of both hips, right greater than left. Electronically Signed   By: Narda Rutherford M.D.   On: 12/20/2023 10:54   DG Lumbar Spine Complete W/Bend Result Date: 12/20/2023 CLINICAL DATA:  Low back pain. EXAM: LUMBAR SPINE - COMPLETE WITH BENDING VIEWS COMPARISON:  Lumbar MRI 11/18/2022 FINDINGS: L5 is sacralized with 4 true non rib-bearing lumbar vertebra. Small L5-S1 disc space. The alignment is normal. No change in alignment on flexion or extension. Vertebral body  heights are normal. Moderate multilevel facet hypertrophy. Slight L3-L4 disc space narrowing. No visible pars defects. Aortic atherosclerosis. IMPRESSION: 1. 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 2. Moderate multilevel facet hypertrophy. 3. Slight L3-L4 disc space narrowing. Electronically Signed   By: Narda Rutherford M.D.   On: 12/20/2023 10:53    Microbiology: Results for orders placed or performed during the hospital encounter of 12/29/23  Resp panel by RT-PCR (RSV, Flu A&B, Covid) Anterior Nasal Swab     Status: Abnormal   Collection Time: 12/29/23  3:45 PM   Specimen: Anterior Nasal Swab  Result Value Ref Range Status   SARS Coronavirus 2 by RT PCR NEGATIVE NEGATIVE Final    Comment: (NOTE) SARS-CoV-2 target nucleic acids are NOT DETECTED.  The SARS-CoV-2 RNA is generally detectable in upper  respiratory specimens during the acute phase of infection. The lowest concentration of SARS-CoV-2 viral copies this assay can detect is 138 copies/mL. A negative result does not preclude SARS-Cov-2 infection and should not be used as the sole basis for treatment or other patient management decisions. A negative result may occur with  improper specimen collection/handling, submission of specimen other than nasopharyngeal swab, presence of viral mutation(s) within the areas targeted by this assay, and inadequate number of viral copies(<138 copies/mL). A negative result must be combined with clinical observations, patient history, and epidemiological information. The expected result is Negative.  Fact Sheet for Patients:  BloggerCourse.com  Fact Sheet for Healthcare Providers:  SeriousBroker.it  This test is no t yet approved or cleared by the [place] FDA and  has been authorized for detection and/or diagnosis of SARS-CoV-2 by FDA under an Emergency Use Authorization (EUA). This EUA will remain  in effect (meaning this test can be used) for the duration of the COVID-19 declaration under Section 564(b)(1) of the Act, 21 U.S.C.section 360bbb-3(b)(1), unless the authorization is terminated  or revoked sooner.       Influenza A by PCR POSITIVE (A) NEGATIVE Final   Influenza B by PCR NEGATIVE NEGATIVE Final    Comment: (NOTE) The Xpert Xpress SARS-CoV-2/FLU/RSV plus assay is intended as an aid in the diagnosis of influenza from Nasopharyngeal swab specimens and should not be used as a sole basis for treatment. Nasal washings and aspirates are unacceptable for Xpert Xpress SARS-CoV-2/FLU/RSV testing.  Fact Sheet for Patients: BloggerCourse.com  Fact Sheet for Healthcare Providers: SeriousBroker.it  This test is not yet approved or cleared by the [place] FDA and has been  authorized for detection and/or diagnosis of SARS-CoV-2 by FDA under an Emergency Use Authorization (EUA). This EUA will remain in effect (meaning this test can be used) for the duration of the COVID-19 declaration under Section 564(b)(1) of the Act, 21 U.S.C. section 360bbb-3(b)(1), unless the authorization is terminated or revoked.     Resp Syncytial Virus by PCR NEGATIVE NEGATIVE Final    Comment: (NOTE) Fact Sheet for Patients: BloggerCourse.com  Fact Sheet for Healthcare Providers: SeriousBroker.it  This test is not yet approved or cleared by the [place] FDA and has been authorized for detection and/or diagnosis of SARS-CoV-2 by FDA under an Emergency Use Authorization (EUA). This EUA will remain in effect (meaning this test can be used) for the duration of the COVID-19 declaration under Section 564(b)(1) of the Act, 21 U.S.C. section 360bbb-3(b)(1), unless the authorization is terminated or revoked.  Performed at [facility], [address] [place], [place] [address]     Labs: CBC: Recent Labs  Lab 12/29/23 [phone]  12/30/23 0637 12/31/23 0646  WBC 4.9  4.8 5.2 7.6  NEUTROABS  --   --  3.9  HGB 13.2  13.2 13.6 13.1  HCT 39.1  39.9 40.3 38.1  MCV 92.2  93.7 92.9 91.1  PLT 389  384 393 393   Basic Metabolic Panel: Recent Labs  Lab 12/29/23 1937 12/30/23 0637 12/30/23 1259 12/31/23 0646  NA 138 139  --  138  K 2.9* 4.4  --  3.3*  CL 97* 100  --  99  CO2 29 29  --  30  GLUCOSE 166* 299* 411* 138*  BUN 10 14  --  15  CREATININE 0.78  0.73 0.80  --  0.75  CALCIUM 9.3 9.3  --  9.1  MG  --   --   --  1.9   Liver Function Tests: No results for input(s): "AST", "ALT", "ALKPHOS", "BILITOT", "PROT", "ALBUMIN" in the last 168 hours. CBG: Recent Labs  Lab 12/30/23 1245 12/30/23 2027 12/31/23 0036 12/31/23 0733 12/31/23 1141  GLUCAP 410* 236* 92 118* 219*    Discharge time spent:  35  minutes.  Signed: Loyce Dys, MD [facility] 12/31/2023

## 2023-12-31 NOTE — Progress Notes (Signed)
Mobility Specialist - Progress Note     12/31/23 1344  Mobility  Activity Ambulated with assistance in hallway  Level of Assistance +2 (takes two people)  Activity Response Tolerated well  Mobility Referral Yes  Mobility visit 1 Mobility   Pt resting in bed on RA upon entry. Pt STS and ambulates to hallway CGA +2 with chair follow for safety. Pt endorses pain and begins limping after 6 ft to the door due to pain in left hip. Pt maintained upright standing despite pain in hip. Pt returned to chair and left with needs in reach. RN gave instructions on meds and discharge information.   Johnathan Hausen Mobility Specialist 12/31/23, 1:48 PM

## 2023-12-31 NOTE — Progress Notes (Signed)
Discussed scheduled nebulizer treatments. Spouse at bedside. No acute distress noted at this time.

## 2023-12-31 NOTE — Plan of Care (Signed)

## 2023-12-31 NOTE — Progress Notes (Addendum)
Received call from monitor technician, states patient had 1 unsustained run of wide QRS v-tach for 13 beats. Patient sleeping, snoring. Easily aroused, no acute distress noted.   Provider, Manuela Schwartz, notified.

## 2023-12-31 NOTE — Progress Notes (Signed)
Provider, Manuela Schwartz, added magnesium labs for today.

## 2024-01-02 ENCOUNTER — Ambulatory Visit (HOSPITAL_BASED_OUTPATIENT_CLINIC_OR_DEPARTMENT_OTHER): Payer: 59 | Admitting: Pain Medicine

## 2024-01-02 DIAGNOSIS — E538 Deficiency of other specified B group vitamins: Secondary | ICD-10-CM | POA: Insufficient documentation

## 2024-01-02 DIAGNOSIS — M47816 Spondylosis without myelopathy or radiculopathy, lumbar region: Secondary | ICD-10-CM | POA: Insufficient documentation

## 2024-01-02 DIAGNOSIS — Z7901 Long term (current) use of anticoagulants: Secondary | ICD-10-CM

## 2024-01-02 DIAGNOSIS — M5459 Other low back pain: Secondary | ICD-10-CM

## 2024-01-02 DIAGNOSIS — Z91199 Patient's noncompliance with other medical treatment and regimen due to unspecified reason: Secondary | ICD-10-CM

## 2024-01-02 DIAGNOSIS — G8929 Other chronic pain: Secondary | ICD-10-CM

## 2024-01-02 DIAGNOSIS — R937 Abnormal findings on diagnostic imaging of other parts of musculoskeletal system: Secondary | ICD-10-CM

## 2024-01-02 LAB — GLUCOSE, CAPILLARY: Glucose-Capillary: 318 mg/dL — ABNORMAL HIGH (ref 70–99)

## 2024-01-04 ENCOUNTER — Ambulatory Visit: Payer: 59

## 2024-01-09 ENCOUNTER — Ambulatory Visit: Payer: 59

## 2024-01-09 DIAGNOSIS — M25552 Pain in left hip: Secondary | ICD-10-CM

## 2024-01-09 DIAGNOSIS — R262 Difficulty in walking, not elsewhere classified: Secondary | ICD-10-CM

## 2024-01-09 DIAGNOSIS — M6281 Muscle weakness (generalized): Secondary | ICD-10-CM | POA: Diagnosis present

## 2024-01-09 DIAGNOSIS — M545 Low back pain, unspecified: Secondary | ICD-10-CM | POA: Diagnosis present

## 2024-01-09 DIAGNOSIS — G8929 Other chronic pain: Secondary | ICD-10-CM | POA: Diagnosis present

## 2024-01-09 DIAGNOSIS — M25551 Pain in right hip: Secondary | ICD-10-CM

## 2024-01-09 NOTE — Therapy (Signed)
 OUTPATIENT PHYSICAL THERAPY THORACOLUMBAR TREATMENT   Patient Name: Kristie Olson MRN: [id number] DOB:Jun 19, 1956, 68 y.o., female Today's Date: 01/09/2024  END OF SESSION:  PT End of Session - 01/09/24 1306     Visit Number 4    Number of Visits 17    Date for PT Re-Evaluation 02/09/24    PT Start Time 1305    PT Stop Time 1345    PT Time Calculation (min) 40 min    Activity Tolerance Patient tolerated treatment well    Behavior During Therapy [facility] for tasks assessed/performed             Past Medical History:  Diagnosis Date   Asthma    Coronary artery disease    Diabetes mellitus without complication (HCC) 1990   Fibromyalgia    Hypertension    PAD (peripheral artery disease) (HCC) 09/03/2022   Past Surgical History:  Procedure Laterality Date   BREAST EXCISIONAL BIOPSY Left 1980's   neg   BREAST MASS EXCISION Left    age 73"s, benign   CESAREAN SECTION     CHOLECYSTECTOMY  2009   LOWER EXTREMITY ANGIOGRAPHY Left 09/03/2022   Procedure: Lower Extremity Angiography;  Surgeon: Annice Needy, MD;  Location: [facility];  Service: Cardiovascular;  Laterality: Left;   LOWER EXTREMITY ANGIOGRAPHY Right 09/08/2022   Procedure: Lower Extremity Angiography;  Surgeon: Annice Needy, MD;  Location: [facility];  Service: Cardiovascular;  Laterality: Right;   LOWER EXTREMITY ANGIOGRAPHY Left 01/18/2023   Procedure: Lower Extremity Angiography;  Surgeon: Renford Dills, MD;  Location: [facility];  Service: Cardiovascular;  Laterality: Left;   LOWER EXTREMITY ANGIOGRAPHY Left 02/14/2023   Procedure: Lower Extremity Angiography;  Surgeon: Annice Needy, MD;  Location: [facility];  Service: Cardiovascular;  Laterality: Left;   LOWER EXTREMITY ANGIOGRAPHY Left 06/27/2023   Procedure: Lower Extremity Angiography;  Surgeon: Annice Needy, MD;  Location: [facility];  Service: Cardiovascular;  Laterality: Left;   Patient Active Problem  List   Diagnosis Date Noted   Lumbar facet hypertrophy (Multilevel) (Bilateral) 01/02/2024   Vitamin B12 deficiency 01/02/2024   Influenza A 12/29/2023   Acute respiratory failure with hypoxia (HCC) 12/29/2023   Tobacco use disorder 12/29/2023   Obesity, Class III, BMI 40-49.9 (morbid obesity) (HCC) 12/29/2023   Vitamin D deficiency 12/20/2023   Lumbar facet arthropathy 12/12/2023   Lumbar facet joint pain 12/12/2023   Osteoarthritis of facet joint of lumbar spine 12/12/2023   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   Spondylosis without myelopathy or radiculopathy, lumbosacral region 12/12/2023   Chronic anticoagulation (Plavix) 12/12/2023   Atherosclerotic peripheral vascular disease (HCC) 12/12/2023   Class 3 severe obesity with body mass index (BMI) of 40.0 to 44.9 in adult [facility]) 12/12/2023   Chronic hip pain (2ry area of Pain) (Bilateral) 12/12/2023   Chronic feet pain (3ry area of Pain) (Bilateral) 12/12/2023   Burning pain 12/12/2023   Chronic painful diabetic neuropathy (HCC) 12/12/2023   Chronic low back pain (1ry area of Pain) (Bilateral) w/o sciatica 12/12/2023   Chronic pain syndrome 06/27/2023   Pharmacologic therapy 06/27/2023   Disorder of skeletal system 06/27/2023   Problems influencing health status 06/27/2023   Abnormal MRI, lumbar spine (11/20/2022) 06/27/2023   PAD (peripheral artery disease) (HCC) 09/03/2022   Claudication of lower extremities (Bilateral) (HCC) 09/03/2022   Critical limb ischemia of lower extremities (Bilateral) (HCC)    Cervical spondylosis without myelopathy 01/16/2021  Disorder of bursae of shoulder region 01/16/2021   Right bundle branch block 09/18/2020   Skin macule or macular rash 09/18/2020   Vulvar itching 09/18/2020   Acute bronchitis with asthma with acute exacerbation 09/16/2020   Peripheral neuropathy 09/16/2020   Pancreatitis 09/16/2020   Anxiety and depression 09/16/2020   Insulin dependent type 2 diabetes mellitus (HCC)  09/16/2020   Benign essential hypertension 07/31/2020   Hematuria 07/31/2020   Proteinuria 07/31/2020   Type II or unspecified type diabetes mellitus with renal manifestations, not stated as uncontrolled 07/31/2020   Acute pancreatitis 04/28/2020   Diabetes mellitus with hyperglycemia (HCC)    Dehydration    Depression    Bronchitis 11/26/2018   Cervical radiculopathy 08/14/2018   Type 2 diabetes mellitus with hyperglycemia (HCC) 06/20/2018   Breast abscess of female 03/29/2018   Transient ischemic attack 08/05/2017   Slurred speech 07/27/2017   Dizziness 02/22/2017   Long term current use of insulin (HCC) 11/01/2013   Type 2 diabetes mellitus with diabetic neuropathy (HCC) 04/18/2013   Hyperlipidemia 07/19/2008   Hypertension 10/25/2007    PCP: [facility]  REFERRING PROVIDER: Delano Metz, MD  REFERRING DIAG: M25.551,M25.552,G89.29 (ICD-10-CM) - Chronic hip pain, bilateral  Rationale for Evaluation and Treatment: Rehabilitation  THERAPY DIAG:  Pain in right hip  Pain in left hip  Chronic bilateral low back pain without sciatica  Muscle weakness (generalized)  Difficulty in walking, not elsewhere classified  ONSET DATE: B hip pain and lower back pain chronic for many years  SUBJECTIVE:                                                                                                                                                                                           SUBJECTIVE STATEMENT: Pt reports having the flu. Had to go to the hospital. Currently hip pain is 4/10 NPS. Denies falls. Just feels weak.  PERTINENT HISTORY:  Pt reports B LBP where pain begins in lower back and refers into her groin. Sometimes happens bilaterally but R side is more painful than L side. Has history of LE neuropathy from feet to her knees. Pain is stiff and tight bilaterally. Pain feels deep. Pain begins in lower back then refers to hips. Laying down improves LBP   but hurts her hips. Standing aggravates hips and lower back after about 5 minutes. Pain worst in the morning and improves with motion. Has hx of ESI's in lower back but did not provide relief. Worst pain 8-9/10 NPS on average. Best pain 4/10 NPS. Cold weather makes her pain worse. Many near falls but no falls. Hurts with any standing  and walking tasks. B hip pain worsens with laying down.  PAIN:  Are you having pain? Yes: NPRS scale: 4/10 NPS Pain location: across lower back, central lower back, B hips into groins Pain description: tight, achey Aggravating factors: laying down, sitting, standing, walking Relieving factors: N/A  PRECAUTIONS: Fall  RED FLAGS: None   WEIGHT BEARING RESTRICTIONS: No  FALLS:  Has patient fallen in last 6 months? No  LIVING ENVIRONMENT: Lives with: lives with their family and lives with their spouse Lives in: House/apartment Stairs: Yes: External: 5 steps; bilateral but cannot reach both Has following equipment at home: Quad cane small base, Walker - 4 wheeled, and bed side commode  OCCUPATION: Disability/retired  PLOF: Independent  PATIENT GOALS: Be able to more active and independent.  NEXT MD VISIT: 01/12/24  OBJECTIVE:  Note: Objective measures were completed at Evaluation unless otherwise noted.  DIAGNOSTIC FINDINGS:  Pending MRI in EMR. Will post results when available.   PATIENT SURVEYS:  Modified Oswestry 74%   COGNITION: Overall cognitive status: Within functional limits for tasks assessed     SENSATION: Light touch: WFL. Impaired at feet due to neuropathy.  POSTURE: rounded shoulders, forward head, decreased lumbar lordosis, and increased thoracic kyphosis  PALPATION: TTP at R sided SIJ, lumbar paraspinals L5-S1 and upper gluteals on R and L. TTP at bilateral greater trochanters.   LUMBAR ROM:   AROM eval  Flexion 20%  Extension 10%*  Right lateral flexion 50%*  Left lateral flexion 50%*  Right rotation 80%  Left  rotation 80%   (Blank rows = not tested)  LOWER EXTREMITY ROM:     Active  Right eval Left eval  Hip flexion 75 75  Hip extension 0 (10* PROM) 0 (15* PROM)  Hip abduction    Hip adduction    Hip internal rotation 25* 20*  Hip external rotation 20 25*  Knee flexion    Knee extension    Ankle dorsiflexion    Ankle plantarflexion    Ankle inversion    Ankle eversion     (Blank rows = not tested)  LOWER EXTREMITY MMT:    MMT Right eval Left eval  Hip flexion 4 4  Hip extension 2 2  Hip abduction 3 3  Hip adduction    Hip internal rotation 4* 4*  Hip external rotation 4* 4*  Knee flexion 4 4  Knee extension 4 4  Ankle dorsiflexion 3+ 3+  Ankle plantarflexion    Ankle inversion    Ankle eversion     (Blank rows = not tested)  LUMBAR SPECIAL TESTS:  FABER test: Negative FADDIR: Positive bilaterally  HIP/SIJ SPECIAL TESTS: Sacral thrust: positive Hip scour: positive R and L Hip distraction: relief on LLE but not RLE.  FUNCTIONAL TESTS:  Timed up and go (TUG): 38.5 seconds   5xSTS: deferred to next session  JOINT MOBILITY: Femoroacetabular AP hypomobile with concordant pain sign bilaterally.   GAIT: Distance walked: 10' Assistive device utilized: Counselling psychologist Level of assistance: CGA Comments: Decreased step lengths and R sided antalgic gait. Limited hip extension bialterally with limited step through gait.   TREATMENT DATE: 01/09/24   There.Act: STS in chair with airex pad with light BUE support: 2x5   Standing blue TB scap retractions: 3x8. Seated rest break after 2 sets.   Standing with BUE support:   Alternating marches: 2x6, seated rest b/t sets.    B hip abduction: 2x6/LE. TC's on shoulders and pelvis to prevent lateral leaning  compensation.    Provided updated HEP with standing based therex. Pt has limitations due to pain completing hook lying based exercises thus discontinued. Educated pt and spouse on safe ways to complete exercises at  home to reduce falls risk and how to safely anchor blue TB.                                                                     PATIENT EDUCATION:  Education details: POC, prognosis Person educated: Patient Education method: Explanation Education comprehension: verbalized understanding  HOME EXERCISE PROGRAM: Access Code: [email or web address] URL: [email or web address] Date: 12/19/2023 Prepared by: Ronnie Derby  Exercises - Supine Lower Trunk Rotation  - 1 x daily - 4-5 x weekly - 2 sets - 12 reps - Supine Posterior Pelvic Tilt  - 1 x daily - 4-5 x weekly - 2 sets - 12 reps - Sit to Stand  - 1 x daily - 3-4 x weekly - 3 sets - 6 reps   ASSESSMENT:  CLINICAL IMPRESSION: Continuing PT POC with focus on standing based therex. Pt with less pain response tolerating standing, sitting, and transfer activities. Pt relies on seated rest breaks due to hip pain and increased fatigue as pt is recovering from the flu. Pt has excellent tolerance for today's session despite regular seated rest breaks. Has regular need for BUE support to perform her exercises due to difficulty maintaining balance. Updated HEP to reflect increased physical activity as pt has not been performing supine HEP due to pain. Spouse and pt educated on safe form/technique and reduced falls risk with updated HEP. Pt remains with significant gait deficits and LE weakness/pain limiting ability to complete Adl's leading to high falls risk. Pt will benefit from skilled PT services to address these deficits and maximize independence with functional mobility and reduce falls risk.    OBJECTIVE IMPAIRMENTS: Abnormal gait, decreased activity tolerance, decreased mobility, difficulty walking, decreased ROM, decreased strength, hypomobility, postural dysfunction, obesity, and pain.   ACTIVITY LIMITATIONS: carrying, lifting, bending, sitting, standing, squatting, sleeping, stairs, transfers, bed mobility, bathing, dressing,  hygiene/grooming, and locomotion level  PARTICIPATION LIMITATIONS: cleaning, laundry, and community activity  PERSONAL FACTORS: Age, Behavior pattern, Fitness, Past/current experiences, Time since onset of injury/illness/exacerbation, and 3+ comorbidities: DM, Fibromyalgia, HTN, Peripheral Neuropathy, PAD, depression/anxiety, HLD  are also affecting patient's functional outcome.   REHAB POTENTIAL: Poor Chronicity of symptoms, significant PMH  CLINICAL DECISION MAKING: Evolving/moderate complexity  EVALUATION COMPLEXITY: Moderate   GOALS: Goals reviewed with patient? No  SHORT TERM GOALS: Target date: 01/12/24  Pt will be independent with HEP to improve lumbar and hip AROM and LE strength. Baseline: 12/15/23: provide at next session Goal status: INITIAL  LONG TERM GOALS: Target date: 02/09/24  Pt will improve mODI by at least 13 points to demonstrate clinically significant reduction in disability due to her LBP.  Baseline: 12/15/23: 74% Goal status: INITIAL  2.  Pt will improve TUG to 20 seconds or less to demonstrate improved independence in transfers and reduced falls risk with household and community ADL's. Baseline: 12/15/23: 38.5 seconds Goal status: INITIAL  3.  Pt will improve 5xSTS to aged matched norms of 11.4 seconds or less to demonstrate clinically significant improvement in leg strength.  Baseline: 12/15/23: deferred to next session  Goal status: INITIAL  4.  Pt will report worst pain as a 6/10 NPS or less to demonstrate clinically significant reduction in hip and lower back pain.  Baseline: 12/15/23: ranges from 8-10/10 at worst Goal status: INITIAL  5.  Pt will improve R/L hip flexion, IR, and ER by at least 5 degrees in all planes to assist in reduction of joint related pain. Baseline: 12/15/23:  Hip flexion 75 75  Hip extension    Hip abduction    Hip adduction    Hip internal rotation 25* 20*  Hip external rotation 20 25*    PLAN:  PT FREQUENCY:  1-2x/week  PT DURATION: 8 weeks  PLANNED INTERVENTIONS: 97164- PT Re-evaluation, 97110-Therapeutic exercises, 97530- Therapeutic activity, 97112- Neuromuscular re-education, 97535- Self Care, [id number]- Manual therapy, [id number]- Gait training, 97014- Electrical stimulation (unattended), [id number]- Electrical stimulation (manual), [id number]- Traction (mechanical), Patient/Family education, Balance training, Stair training, Dry Needling, Joint mobilization, Joint manipulation, Spinal manipulation, Spinal mobilization, DME instructions, Cryotherapy, and Moist heat.  PLAN FOR NEXT SESSION: seated, standing, and gait tasks. Transfers from elevated surfaces. Compound strength exercises in pain free ranges.    Delphia Grates. Fairly IV, PT, DPT Physical Therapist- [place]  [facility]  01/09/2024, 2:49 PM

## 2024-01-11 ENCOUNTER — Ambulatory Visit: Payer: 59

## 2024-01-11 DIAGNOSIS — M25551 Pain in right hip: Secondary | ICD-10-CM

## 2024-01-11 DIAGNOSIS — M545 Low back pain, unspecified: Secondary | ICD-10-CM

## 2024-01-11 DIAGNOSIS — R262 Difficulty in walking, not elsewhere classified: Secondary | ICD-10-CM

## 2024-01-11 DIAGNOSIS — M6281 Muscle weakness (generalized): Secondary | ICD-10-CM

## 2024-01-11 DIAGNOSIS — M25552 Pain in left hip: Secondary | ICD-10-CM

## 2024-01-11 NOTE — Therapy (Signed)
 OUTPATIENT PHYSICAL THERAPY THORACOLUMBAR TREATMENT   Patient Name: Kristie Olson MRN: [id number] DOB:12/09/1955, 68 y.o., female Today's Date: 01/11/2024  END OF SESSION:  PT End of Session - 01/11/24 1310     Visit Number 5    Number of Visits 17    Date for PT Re-Evaluation 02/09/24    PT Start Time 1310    PT Stop Time 1342    PT Time Calculation (min) 32 min    Activity Tolerance Patient tolerated treatment well    Behavior During Therapy [facility] for tasks assessed/performed             Past Medical History:  Diagnosis Date   Asthma    Coronary artery disease    Diabetes mellitus without complication (HCC) 1990   Fibromyalgia    Hypertension    PAD (peripheral artery disease) (HCC) 09/03/2022   Past Surgical History:  Procedure Laterality Date   BREAST EXCISIONAL BIOPSY Left 1980's   neg   BREAST MASS EXCISION Left    age 25"s, benign   CESAREAN SECTION     CHOLECYSTECTOMY  2009   LOWER EXTREMITY ANGIOGRAPHY Left 09/03/2022   Procedure: Lower Extremity Angiography;  Surgeon: Annice Needy, MD;  Location: [facility];  Service: Cardiovascular;  Laterality: Left;   LOWER EXTREMITY ANGIOGRAPHY Right 09/08/2022   Procedure: Lower Extremity Angiography;  Surgeon: Annice Needy, MD;  Location: [facility];  Service: Cardiovascular;  Laterality: Right;   LOWER EXTREMITY ANGIOGRAPHY Left 01/18/2023   Procedure: Lower Extremity Angiography;  Surgeon: Renford Dills, MD;  Location: [facility];  Service: Cardiovascular;  Laterality: Left;   LOWER EXTREMITY ANGIOGRAPHY Left 02/14/2023   Procedure: Lower Extremity Angiography;  Surgeon: Annice Needy, MD;  Location: [facility];  Service: Cardiovascular;  Laterality: Left;   LOWER EXTREMITY ANGIOGRAPHY Left 06/27/2023   Procedure: Lower Extremity Angiography;  Surgeon: Annice Needy, MD;  Location: [facility];  Service: Cardiovascular;  Laterality: Left;   Patient Active Problem  List   Diagnosis Date Noted   Lumbar facet hypertrophy (Multilevel) (Bilateral) 01/02/2024   Vitamin B12 deficiency 01/02/2024   Influenza A 12/29/2023   Acute respiratory failure with hypoxia (HCC) 12/29/2023   Tobacco use disorder 12/29/2023   Obesity, Class III, BMI 40-49.9 (morbid obesity) (HCC) 12/29/2023   Vitamin D deficiency 12/20/2023   Lumbar facet arthropathy 12/12/2023   Lumbar facet joint pain 12/12/2023   Osteoarthritis of facet joint of lumbar spine 12/12/2023   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   Spondylosis without myelopathy or radiculopathy, lumbosacral region 12/12/2023   Chronic anticoagulation (Plavix) 12/12/2023   Atherosclerotic peripheral vascular disease (HCC) 12/12/2023   Class 3 severe obesity with body mass index (BMI) of 40.0 to 44.9 in adult [facility]) 12/12/2023   Chronic hip pain (2ry area of Pain) (Bilateral) 12/12/2023   Chronic feet pain (3ry area of Pain) (Bilateral) 12/12/2023   Burning pain 12/12/2023   Chronic painful diabetic neuropathy (HCC) 12/12/2023   Chronic low back pain (1ry area of Pain) (Bilateral) w/o sciatica 12/12/2023   Chronic pain syndrome 06/27/2023   Pharmacologic therapy 06/27/2023   Disorder of skeletal system 06/27/2023   Problems influencing health status 06/27/2023   Abnormal MRI, lumbar spine (11/20/2022) 06/27/2023   PAD (peripheral artery disease) (HCC) 09/03/2022   Claudication of lower extremities (Bilateral) (HCC) 09/03/2022   Critical limb ischemia of lower extremities (Bilateral) (HCC)    Cervical spondylosis without myelopathy 01/16/2021  Disorder of bursae of shoulder region 01/16/2021   Right bundle branch block 09/18/2020   Skin macule or macular rash 09/18/2020   Vulvar itching 09/18/2020   Acute bronchitis with asthma with acute exacerbation 09/16/2020   Peripheral neuropathy 09/16/2020   Pancreatitis 09/16/2020   Anxiety and depression 09/16/2020   Insulin dependent type 2 diabetes mellitus (HCC)  09/16/2020   Benign essential hypertension 07/31/2020   Hematuria 07/31/2020   Proteinuria 07/31/2020   Type II or unspecified type diabetes mellitus with renal manifestations, not stated as uncontrolled 07/31/2020   Acute pancreatitis 04/28/2020   Diabetes mellitus with hyperglycemia (HCC)    Dehydration    Depression    Bronchitis 11/26/2018   Cervical radiculopathy 08/14/2018   Type 2 diabetes mellitus with hyperglycemia (HCC) 06/20/2018   Breast abscess of female 03/29/2018   Transient ischemic attack 08/05/2017   Slurred speech 07/27/2017   Dizziness 02/22/2017   Long term current use of insulin (HCC) 11/01/2013   Type 2 diabetes mellitus with diabetic neuropathy (HCC) 04/18/2013   Hyperlipidemia 07/19/2008   Hypertension 10/25/2007    PCP: Lorin Picket [facility]  REFERRING PROVIDER: Delano Metz, MD  REFERRING DIAG: M25.551,M25.552,G89.29 (ICD-10-CM) - Chronic hip pain, bilateral  Rationale for Evaluation and Treatment: Rehabilitation  THERAPY DIAG:  Pain in right hip  Pain in left hip  Chronic bilateral low back pain without sciatica  Muscle weakness (generalized)  Difficulty in walking, not elsewhere classified  ONSET DATE: B hip pain and lower back pain chronic for many years  SUBJECTIVE:                                                                                                                                                                                           SUBJECTIVE STATEMENT: Pt reports having 3-4/10 NPS pain in R hip.   PERTINENT HISTORY:  Pt reports B LBP where pain begins in lower back and refers into her groin. Sometimes happens bilaterally but R side is more painful than L side. Has history of LE neuropathy from feet to her knees. Pain is stiff and tight bilaterally. Pain feels deep. Pain begins in lower back then refers to hips. Laying down improves LBP  but hurts her hips. Standing aggravates hips and lower back after  about 5 minutes. Pain worst in the morning and improves with motion. Has hx of ESI's in lower back but did not provide relief. Worst pain 8-9/10 NPS on average. Best pain 4/10 NPS. Cold weather makes her pain worse. Many near falls but no falls. Hurts with any standing and walking tasks. B hip pain worsens with laying down.  PAIN:  Are you having pain? Yes: NPRS scale: 4/10 NPS Pain location: across lower back, central lower back, B hips into groins Pain description: tight, achey Aggravating factors: laying down, sitting, standing, walking Relieving factors: N/A  PRECAUTIONS: Fall  RED FLAGS: None   WEIGHT BEARING RESTRICTIONS: No  FALLS:  Has patient fallen in last 6 months? No  LIVING ENVIRONMENT: Lives with: lives with their family and lives with their spouse Lives in: House/apartment Stairs: Yes: External: 5 steps; bilateral but cannot reach both Has following equipment at home: Quad cane small base, Walker - 4 wheeled, and bed side commode  OCCUPATION: Disability/retired  PLOF: Independent  PATIENT GOALS: Be able to more active and independent.  NEXT MD VISIT: 01/12/24  OBJECTIVE:  Note: Objective measures were completed at Evaluation unless otherwise noted.  DIAGNOSTIC FINDINGS:  Pending MRI in EMR. Will post results when available.   PATIENT SURVEYS:  Modified Oswestry 74%   COGNITION: Overall cognitive status: Within functional limits for tasks assessed     SENSATION: Light touch: WFL. Impaired at feet due to neuropathy.  POSTURE: rounded shoulders, forward head, decreased lumbar lordosis, and increased thoracic kyphosis  PALPATION: TTP at R sided SIJ, lumbar paraspinals L5-S1 and upper gluteals on R and L. TTP at bilateral greater trochanters.   LUMBAR ROM:   AROM eval  Flexion 20%  Extension 10%*  Right lateral flexion 50%*  Left lateral flexion 50%*  Right rotation 80%  Left rotation 80%   (Blank rows = not tested)  LOWER EXTREMITY ROM:      Active  Right eval Left eval  Hip flexion 75 75  Hip extension 0 (10* PROM) 0 (15* PROM)  Hip abduction    Hip adduction    Hip internal rotation 25* 20*  Hip external rotation 20 25*  Knee flexion    Knee extension    Ankle dorsiflexion    Ankle plantarflexion    Ankle inversion    Ankle eversion     (Blank rows = not tested)  LOWER EXTREMITY MMT:    MMT Right eval Left eval  Hip flexion 4 4  Hip extension 2 2  Hip abduction 3 3  Hip adduction    Hip internal rotation 4* 4*  Hip external rotation 4* 4*  Knee flexion 4 4  Knee extension 4 4  Ankle dorsiflexion 3+ 3+  Ankle plantarflexion    Ankle inversion    Ankle eversion     (Blank rows = not tested)  LUMBAR SPECIAL TESTS:  FABER test: Negative FADDIR: Positive bilaterally  HIP/SIJ SPECIAL TESTS: Sacral thrust: positive Hip scour: positive R and L Hip distraction: relief on LLE but not RLE.  FUNCTIONAL TESTS:  Timed up and go (TUG): 38.5 seconds   5xSTS: deferred to next session  JOINT MOBILITY: Femoroacetabular AP hypomobile with concordant pain sign bilaterally.   GAIT: Distance walked: 10' Assistive device utilized: Counselling psychologist Level of assistance: CGA Comments: Decreased step lengths and R sided antalgic gait. Limited hip extension bialterally with limited step through gait.   TREATMENT DATE: 01/11/24   There.Act: STS in chair with light SUE support: 2x5, Supervision.   Side stepping along cubical for hip abduction strengthening, dynamic balance in frontal plane. CGA. 4x8'. Seated rest. SBA, with BUE support for steadying.  Side step alternating marches 2x8' with BUE support.   X3 hurdle step overs for hip flexor strength, QC sequencing step to pattern. X2 laps   4" step ups. X2 leading with LUE  with seated rest after. X2 leading with RUE. Using QC throughout. Min VC's for LE placement and QC sequencing. CGA.                                                                    PATIENT EDUCATION:  Education details: POC, prognosis Person educated: Patient Education method: Explanation Education comprehension: verbalized understanding  HOME EXERCISE PROGRAM: Access Code: [email or web address] URL: [email or web address] Date: 12/19/2023 Prepared by: Ronnie Derby  Exercises - Supine Lower Trunk Rotation  - 1 x daily - 4-5 x weekly - 2 sets - 12 reps - Supine Posterior Pelvic Tilt  - 1 x daily - 4-5 x weekly - 2 sets - 12 reps - Sit to Stand  - 1 x daily - 3-4 x weekly - 3 sets - 6 reps   ASSESSMENT:  CLINICAL IMPRESSION: Continuing PT POC with focus on standing based functional activity and strength exercises. Pt able to stand today with SUE support and without elevation from airex pad. Also beginning to address foot clearance and SLS with hurdle and step up obstacle navigation to also address LE weakness and community walking ADL's. Pt remains very weak in RLE needing QC, close PT support and PRN SUE support on surfaces due to her balance. Pt remains with significant gait deficits and LE weakness/pain limiting ability to complete Adl's leading to high falls risk. Pt will benefit from skilled PT services to address these deficits and maximize independence with functional mobility and reduce falls risk.   OBJECTIVE IMPAIRMENTS: Abnormal gait, decreased activity tolerance, decreased mobility, difficulty walking, decreased ROM, decreased strength, hypomobility, postural dysfunction, obesity, and pain.   ACTIVITY LIMITATIONS: carrying, lifting, bending, sitting, standing, squatting, sleeping, stairs, transfers, bed mobility, bathing, dressing, hygiene/grooming, and locomotion level  PARTICIPATION LIMITATIONS: cleaning, laundry, and community activity  PERSONAL FACTORS: Age, Behavior pattern, Fitness, Past/current experiences, Time since onset of injury/illness/exacerbation, and 3+ comorbidities: DM, Fibromyalgia, HTN, Peripheral Neuropathy, PAD,  depression/anxiety, HLD  are also affecting patient's functional outcome.   REHAB POTENTIAL: Poor Chronicity of symptoms, significant PMH  CLINICAL DECISION MAKING: Evolving/moderate complexity  EVALUATION COMPLEXITY: Moderate   GOALS: Goals reviewed with patient? No  SHORT TERM GOALS: Target date: 01/12/24  Pt will be independent with HEP to improve lumbar and hip AROM and LE strength. Baseline: 12/15/23: provide at next session; 01/11/24: intermittent compliance Goal status: Partially met  LONG TERM GOALS: Target date: 02/09/24  Pt will improve mODI by at least 13 points to demonstrate clinically significant reduction in disability due to her LBP.  Baseline: 12/15/23: 74% Goal status: INITIAL  2.  Pt will improve TUG to 20 seconds or less to demonstrate improved independence in transfers and reduced falls risk with household and community ADL's. Baseline: 12/15/23: 38.5 seconds Goal status: INITIAL  3.  Pt will improve 5xSTS to aged matched norms of 11.4 seconds or less to demonstrate clinically significant improvement in leg strength.  Baseline: 12/15/23: deferred to next session Goal status: INITIAL  4.  Pt will report worst pain as a 6/10 NPS or less to demonstrate clinically significant reduction in hip and lower back pain.  Baseline: 12/15/23: ranges from 8-10/10 at worst Goal status: INITIAL  5.  Pt will improve R/L hip flexion, IR, and ER by at  least 5 degrees in all planes to assist in reduction of joint related pain. Baseline: 12/15/23:  Hip flexion 75 75  Hip extension    Hip abduction    Hip adduction    Hip internal rotation 25* 20*  Hip external rotation 20 25*    PLAN:  PT FREQUENCY: 1-2x/week  PT DURATION: 8 weeks  PLANNED INTERVENTIONS: 97164- PT Re-evaluation, 97110-Therapeutic exercises, 97530- Therapeutic activity, 97112- Neuromuscular re-education, 97535- Self Care, 91478- Manual therapy, [id number]- Gait training, 97014- Electrical stimulation  (unattended), [id number]- Electrical stimulation (manual), [id number]- Traction (mechanical), Patient/Family education, Balance training, Stair training, Dry Needling, Joint mobilization, Joint manipulation, Spinal manipulation, Spinal mobilization, DME instructions, Cryotherapy, and Moist heat.  PLAN FOR NEXT SESSION: Standing, and gait tasks. Transfers from elevated surfaces. Compound strength exercises in pain free ranges. Introducing obstacle navigation with QC.   Delphia Grates. Fairly IV, PT, DPT Physical Therapist- [place]  [facility]  01/11/2024, 1:57 PM

## 2024-01-16 ENCOUNTER — Ambulatory Visit: Payer: 59

## 2024-01-18 ENCOUNTER — Ambulatory Visit: Payer: 59 | Attending: Pain Medicine

## 2024-01-18 DIAGNOSIS — G8929 Other chronic pain: Secondary | ICD-10-CM | POA: Diagnosis present

## 2024-01-18 DIAGNOSIS — M545 Low back pain, unspecified: Secondary | ICD-10-CM | POA: Insufficient documentation

## 2024-01-18 DIAGNOSIS — M25552 Pain in left hip: Secondary | ICD-10-CM | POA: Diagnosis present

## 2024-01-18 DIAGNOSIS — M6281 Muscle weakness (generalized): Secondary | ICD-10-CM | POA: Insufficient documentation

## 2024-01-18 DIAGNOSIS — R262 Difficulty in walking, not elsewhere classified: Secondary | ICD-10-CM | POA: Insufficient documentation

## 2024-01-18 DIAGNOSIS — M25551 Pain in right hip: Secondary | ICD-10-CM | POA: Insufficient documentation

## 2024-01-18 NOTE — Therapy (Signed)
 OUTPATIENT PHYSICAL THERAPY THORACOLUMBAR TREATMENT   Patient Name: Kristie Olson MRN: [id number] DOB:1956/06/28, 68 y.o., female Today's Date: 01/18/2024  END OF SESSION:  PT End of Session - 01/18/24 1304     Visit Number 6    Number of Visits 17    Date for PT Re-Evaluation 02/09/24    PT Start Time 1303    PT Stop Time 1319    PT Time Calculation (min) 16 min    Equipment Utilized During Treatment Gait belt    Activity Tolerance Patient tolerated treatment well    Behavior During Therapy WFL for tasks assessed/performed             Past Medical History:  Diagnosis Date   Asthma    Coronary artery disease    Diabetes mellitus without complication (HCC) 1990   Fibromyalgia    Hypertension    PAD (peripheral artery disease) (HCC) 09/03/2022   Past Surgical History:  Procedure Laterality Date   BREAST EXCISIONAL BIOPSY Left 1980's   neg   BREAST MASS EXCISION Left    age 40"s, benign   CESAREAN SECTION     CHOLECYSTECTOMY  2009   LOWER EXTREMITY ANGIOGRAPHY Left 09/03/2022   Procedure: Lower Extremity Angiography;  Surgeon: Annice Needy, MD;  Location: [facility];  Service: Cardiovascular;  Laterality: Left;   LOWER EXTREMITY ANGIOGRAPHY Right 09/08/2022   Procedure: Lower Extremity Angiography;  Surgeon: Annice Needy, MD;  Location: [facility];  Service: Cardiovascular;  Laterality: Right;   LOWER EXTREMITY ANGIOGRAPHY Left 01/18/2023   Procedure: Lower Extremity Angiography;  Surgeon: Renford Dills, MD;  Location: [facility];  Service: Cardiovascular;  Laterality: Left;   LOWER EXTREMITY ANGIOGRAPHY Left 02/14/2023   Procedure: Lower Extremity Angiography;  Surgeon: Annice Needy, MD;  Location: [facility];  Service: Cardiovascular;  Laterality: Left;   LOWER EXTREMITY ANGIOGRAPHY Left 06/27/2023   Procedure: Lower Extremity Angiography;  Surgeon: Annice Needy, MD;  Location: [facility];  Service: Cardiovascular;   Laterality: Left;   Patient Active Problem List   Diagnosis Date Noted   Lumbar facet hypertrophy (Multilevel) (Bilateral) 01/02/2024   Vitamin B12 deficiency 01/02/2024   Influenza A 12/29/2023   Acute respiratory failure with hypoxia (HCC) 12/29/2023   Tobacco use disorder 12/29/2023   Obesity, Class III, BMI 40-49.9 (morbid obesity) (HCC) 12/29/2023   Vitamin D deficiency 12/20/2023   Lumbar facet arthropathy 12/12/2023   Lumbar facet joint pain 12/12/2023   Osteoarthritis of facet joint of lumbar spine 12/12/2023   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   Spondylosis without myelopathy or radiculopathy, lumbosacral region 12/12/2023   Chronic anticoagulation (Plavix) 12/12/2023   Atherosclerotic peripheral vascular disease (HCC) 12/12/2023   Class 3 severe obesity with body mass index (BMI) of 40.0 to 44.9 in adult [facility]) 12/12/2023   Chronic hip pain (2ry area of Pain) (Bilateral) 12/12/2023   Chronic feet pain (3ry area of Pain) (Bilateral) 12/12/2023   Burning pain 12/12/2023   Chronic painful diabetic neuropathy (HCC) 12/12/2023   Chronic low back pain (1ry area of Pain) (Bilateral) w/o sciatica 12/12/2023   Chronic pain syndrome 06/27/2023   Pharmacologic therapy 06/27/2023   Disorder of skeletal system 06/27/2023   Problems influencing health status 06/27/2023   Abnormal MRI, lumbar spine (11/20/2022) 06/27/2023   PAD (peripheral artery disease) (HCC) 09/03/2022   Claudication of lower extremities (Bilateral) (HCC) 09/03/2022   Critical limb ischemia of lower extremities (Bilateral) (HCC)  Cervical spondylosis without myelopathy 01/16/2021   Disorder of bursae of shoulder region 01/16/2021   Right bundle branch block 09/18/2020   Skin macule or macular rash 09/18/2020   Vulvar itching 09/18/2020   Acute bronchitis with asthma with acute exacerbation 09/16/2020   Peripheral neuropathy 09/16/2020   Pancreatitis 09/16/2020   Anxiety and depression 09/16/2020    Insulin dependent type 2 diabetes mellitus (HCC) 09/16/2020   Benign essential hypertension 07/31/2020   Hematuria 07/31/2020   Proteinuria 07/31/2020   Type II or unspecified type diabetes mellitus with renal manifestations, not stated as uncontrolled 07/31/2020   Acute pancreatitis 04/28/2020   Diabetes mellitus with hyperglycemia (HCC)    Dehydration    Depression    Bronchitis 11/26/2018   Cervical radiculopathy 08/14/2018   Type 2 diabetes mellitus with hyperglycemia (HCC) 06/20/2018   Breast abscess of female 03/29/2018   Transient ischemic attack 08/05/2017   Slurred speech 07/27/2017   Dizziness 02/22/2017   Long term current use of insulin (HCC) 11/01/2013   Type 2 diabetes mellitus with diabetic neuropathy (HCC) 04/18/2013   Hyperlipidemia 07/19/2008   Hypertension 10/25/2007    PCP: Lorin [facility]  REFERRING PROVIDER: Delano Metz, MD  REFERRING DIAG: M25.551,M25.552,G89.29 (ICD-10-CM) - Chronic hip pain, bilateral  Rationale for Evaluation and Treatment: Rehabilitation  THERAPY DIAG:  Pain in right hip  Pain in left hip  Chronic bilateral low back pain without sciatica  Muscle weakness (generalized)  Difficulty in walking, not elsewhere classified  ONSET DATE: B hip pain and lower back pain chronic for many years  SUBJECTIVE:                                                                                                                                                                                           SUBJECTIVE STATEMENT: Pt reports having 8/10 NPS in R hip. Generally has pain throughout her body due to the weather. States her blood sugars are elevated prior to coming to PT.   PERTINENT HISTORY:  Pt reports B LBP where pain begins in lower back and refers into her groin. Sometimes happens bilaterally but R side is more painful than L side. Has history of LE neuropathy from feet to her knees. Pain is stiff and tight bilaterally.  Pain feels deep. Pain begins in lower back then refers to hips. Laying down improves LBP  but hurts her hips. Standing aggravates hips and lower back after about 5 minutes. Pain worst in the morning and improves with motion. Has hx of ESI's in lower back but did not provide relief. Worst pain 8-9/10 NPS on average. Best pain 4/10 NPS. Cold  weather makes her pain worse. Many near falls but no falls. Hurts with any standing and walking tasks. B hip pain worsens with laying down.  PAIN:  Are you having pain? Yes: NPRS scale: 8/10 NPS Pain location: across lower back, central lower back, B hips into groins Pain description: tight, achey Aggravating factors: laying down, sitting, standing, walking Relieving factors: N/A  PRECAUTIONS: Fall  RED FLAGS: None   WEIGHT BEARING RESTRICTIONS: No  FALLS:  Has patient fallen in last 6 months? No  LIVING ENVIRONMENT: Lives with: lives with their family and lives with their spouse Lives in: House/apartment Stairs: Yes: External: 5 steps; bilateral but cannot reach both Has following equipment at home: Quad cane small base, Walker - 4 wheeled, and bed side commode  OCCUPATION: Disability/retired  PLOF: Independent  PATIENT GOALS: Be able to more active and independent.  NEXT MD VISIT: 01/12/24  OBJECTIVE:  Note: Objective measures were completed at Evaluation unless otherwise noted.  DIAGNOSTIC FINDINGS:  Pending MRI in EMR. Will post results when available.   PATIENT SURVEYS:  Modified Oswestry 74%   COGNITION: Overall cognitive status: Within functional limits for tasks assessed     SENSATION: Light touch: WFL. Impaired at feet due to neuropathy.  POSTURE: rounded shoulders, forward head, decreased lumbar lordosis, and increased thoracic kyphosis  PALPATION: TTP at R sided SIJ, lumbar paraspinals L5-S1 and upper gluteals on R and L. TTP at bilateral greater trochanters.   LUMBAR ROM:   AROM eval  Flexion 20%  Extension 10%*   Right lateral flexion 50%*  Left lateral flexion 50%*  Right rotation 80%  Left rotation 80%   (Blank rows = not tested)  LOWER EXTREMITY ROM:     Active  Right eval Left eval  Hip flexion 75 75  Hip extension 0 (10* PROM) 0 (15* PROM)  Hip abduction    Hip adduction    Hip internal rotation 25* 20*  Hip external rotation 20 25*  Knee flexion    Knee extension    Ankle dorsiflexion    Ankle plantarflexion    Ankle inversion    Ankle eversion     (Blank rows = not tested)  LOWER EXTREMITY MMT:    MMT Right eval Left eval  Hip flexion 4 4  Hip extension 2 2  Hip abduction 3 3  Hip adduction    Hip internal rotation 4* 4*  Hip external rotation 4* 4*  Knee flexion 4 4  Knee extension 4 4  Ankle dorsiflexion 3+ 3+  Ankle plantarflexion    Ankle inversion    Ankle eversion     (Blank rows = not tested)  LUMBAR SPECIAL TESTS:  FABER test: Negative FADDIR: Positive bilaterally  HIP/SIJ SPECIAL TESTS: Sacral thrust: positive Hip scour: positive R and L Hip distraction: relief on LLE but not RLE.  FUNCTIONAL TESTS:  Timed up and go (TUG): 38.5 seconds   5xSTS: deferred to next session  JOINT MOBILITY: Femoroacetabular AP hypomobile with concordant pain sign bilaterally.   GAIT: Distance walked: 10' Assistive device utilized: Counselling psychologist Level of assistance: CGA Comments: Decreased step lengths and R sided antalgic gait. Limited hip extension bialterally with limited step through gait.   TREATMENT DATE: 01/11/24   There.Act: STS in chair with TRX straps with airex pad in seat: 3x5, SBA.   Standing blue TB scap retraction: x12  PATIENT EDUCATION:  Education details: POC, prognosis Person educated: Patient Education method: Explanation Education comprehension: verbalized understanding  HOME  EXERCISE PROGRAM: Access Code: [email or web address] URL: [email or web address] Date: 12/19/2023 Prepared by: Ronnie Derby  Exercises - Supine  Lower Trunk Rotation  - 1 x daily - 4-5 x weekly - 2 sets - 12 reps - Supine Posterior Pelvic Tilt  - 1 x daily - 4-5 x weekly - 2 sets - 12 reps - Sit to Stand  - 1 x daily - 3-4 x weekly - 3 sets - 6 reps   ASSESSMENT:  CLINICAL IMPRESSION: Session unfortunately limited today due to pt's recorded blood sugar. Pt reporting feeling unwell with fatigue, pain, feeling sweaty and clammy. Blood sugar in clinic recorded at 334. Pt and author in agreement to discontinue PT today. Pt and spouse understanding to continue to monitor blood glucose to ensure down trending numbers and monitor symptoms. If symptoms not improved encouraged pt to contact PCP. Pt and spouse understanding. Pt remains with many medical barriers limiting good prognosis with PT so far due to acute illness and chronic comorbidities. Pt will benefit from skilled PT services to address these deficits and maximize independence with functional mobility and reduce falls risk.    OBJECTIVE IMPAIRMENTS: Abnormal gait, decreased activity tolerance, decreased mobility, difficulty walking, decreased ROM, decreased strength, hypomobility, postural dysfunction, obesity, and pain.   ACTIVITY LIMITATIONS: carrying, lifting, bending, sitting, standing, squatting, sleeping, stairs, transfers, bed mobility, bathing, dressing, hygiene/grooming, and locomotion level  PARTICIPATION LIMITATIONS: cleaning, laundry, and community activity  PERSONAL FACTORS: Age, Behavior pattern, Fitness, Past/current experiences, Time since onset of injury/illness/exacerbation, and 3+ comorbidities: DM, Fibromyalgia, HTN, Peripheral Neuropathy, PAD, depression/anxiety, HLD  are also affecting patient's functional outcome.   REHAB POTENTIAL: Poor Chronicity of symptoms, significant PMH  CLINICAL DECISION MAKING: Evolving/moderate complexity  EVALUATION COMPLEXITY: Moderate   GOALS: Goals reviewed with patient? No  SHORT TERM GOALS: Target date: 01/12/24  Pt will  be independent with HEP to improve lumbar and hip AROM and LE strength. Baseline: 12/15/23: provide at next session; 01/11/24: intermittent compliance Goal status: Partially met  LONG TERM GOALS: Target date: 02/09/24  Pt will improve mODI by at least 13 points to demonstrate clinically significant reduction in disability due to her LBP.  Baseline: 12/15/23: 74% Goal status: INITIAL  2.  Pt will improve TUG to 20 seconds or less to demonstrate improved independence in transfers and reduced falls risk with household and community ADL's. Baseline: 12/15/23: 38.5 seconds Goal status: INITIAL  3.  Pt will improve 5xSTS to aged matched norms of 11.4 seconds or less to demonstrate clinically significant improvement in leg strength.  Baseline: 12/15/23: deferred to next session Goal status: INITIAL  4.  Pt will report worst pain as a 6/10 NPS or less to demonstrate clinically significant reduction in hip and lower back pain.  Baseline: 12/15/23: ranges from 8-10/10 at worst Goal status: INITIAL  5.  Pt will improve R/L hip flexion, IR, and ER by at least 5 degrees in all planes to assist in reduction of joint related pain. Baseline: 12/15/23:  Hip flexion 75 75  Hip extension    Hip abduction    Hip adduction    Hip internal rotation 25* 20*  Hip external rotation 20 25*    PLAN:  PT FREQUENCY: 1-2x/week  PT DURATION: 8 weeks  PLANNED INTERVENTIONS: 97164- PT Re-evaluation, 97110-Therapeutic exercises, 97530- Therapeutic activity, 97112- Neuromuscular re-education, 97535- Self Care, 16109- Manual therapy, [id number]- Gait training, 97014- Electrical stimulation (unattended), [id number]- Electrical stimulation (manual), [id number]- Traction (mechanical), Patient/Family education, Balance training, Stair training, Dry Needling, Joint mobilization, Joint manipulation,  Spinal manipulation, Spinal mobilization, DME instructions, Cryotherapy, and Moist heat.  PLAN FOR NEXT SESSION: Standing, and gait tasks.  Transfers from elevated surfaces. Compound strength exercises in pain free ranges. Introducing obstacle navigation with QC.   Delphia Grates. Fairly IV, PT, DPT Physical Therapist- [place]  [facility]  01/18/2024, 1:30 PM

## 2024-01-24 ENCOUNTER — Ambulatory Visit: Payer: 59

## 2024-01-24 DIAGNOSIS — M25551 Pain in right hip: Secondary | ICD-10-CM

## 2024-01-24 DIAGNOSIS — R262 Difficulty in walking, not elsewhere classified: Secondary | ICD-10-CM

## 2024-01-24 DIAGNOSIS — M545 Low back pain, unspecified: Secondary | ICD-10-CM

## 2024-01-24 DIAGNOSIS — M25552 Pain in left hip: Secondary | ICD-10-CM

## 2024-01-24 DIAGNOSIS — M6281 Muscle weakness (generalized): Secondary | ICD-10-CM

## 2024-01-24 NOTE — Therapy (Signed)
 OUTPATIENT PHYSICAL THERAPY THORACOLUMBAR TREATMENT   Patient Name: Kristie Olson MRN: [id number] DOB:[birth year], 68 y.o., female Today's Date: 01/24/2024  END OF SESSION:  PT End of Session - 01/24/24 1351     Visit Number 7    Number of Visits 17    Date for PT Re-Evaluation 02/09/24    PT Start Time 1351    PT Stop Time 1430    PT Time Calculation (min) 39 min    Equipment Utilized During Treatment Gait belt    Activity Tolerance Patient tolerated treatment well    Behavior During Therapy WFL for tasks assessed/performed             Past Medical History:  Diagnosis Date   Asthma    Coronary artery disease    Diabetes mellitus without complication (HCC) 1990   Fibromyalgia    Hypertension    PAD (peripheral artery disease) (HCC) 09/03/2022   Past Surgical History:  Procedure Laterality Date   BREAST EXCISIONAL BIOPSY Left 1980's   neg   BREAST MASS EXCISION Left    age 87"s, benign   CESAREAN SECTION     CHOLECYSTECTOMY  2009   LOWER EXTREMITY ANGIOGRAPHY Left 09/03/2022   Procedure: Lower Extremity Angiography;  Surgeon: Annice Needy, MD;  Location: [facility];  Service: Cardiovascular;  Laterality: Left;   LOWER EXTREMITY ANGIOGRAPHY Right 09/08/2022   Procedure: Lower Extremity Angiography;  Surgeon: Annice Needy, MD;  Location: [facility];  Service: Cardiovascular;  Laterality: Right;   LOWER EXTREMITY ANGIOGRAPHY Left 01/18/2023   Procedure: Lower Extremity Angiography;  Surgeon: Renford Dills, MD;  Location: [facility];  Service: Cardiovascular;  Laterality: Left;   LOWER EXTREMITY ANGIOGRAPHY Left 02/14/2023   Procedure: Lower Extremity Angiography;  Surgeon: Annice Needy, MD;  Location: [facility];  Service: Cardiovascular;  Laterality: Left;   LOWER EXTREMITY ANGIOGRAPHY Left 06/27/2023   Procedure: Lower Extremity Angiography;  Surgeon: Annice Needy, MD;  Location: [facility];  Service:  Cardiovascular;  Laterality: Left;   Patient Active Problem List   Diagnosis Date Noted   Lumbar facet hypertrophy (Multilevel) (Bilateral) 01/02/2024   Vitamin B12 deficiency 01/02/2024   Influenza A 12/29/2023   Acute respiratory failure with hypoxia (HCC) 12/29/2023   Tobacco use disorder 12/29/2023   Obesity, Class III, BMI 40-49.9 (morbid obesity) (HCC) 12/29/2023   Vitamin D deficiency 12/20/2023   Lumbar facet arthropathy 12/12/2023   Lumbar facet joint pain 12/12/2023   Osteoarthritis of facet joint of lumbar spine 12/12/2023   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   Spondylosis without myelopathy or radiculopathy, lumbosacral region 12/12/2023   Chronic anticoagulation (Plavix) 12/12/2023   Atherosclerotic peripheral vascular disease (HCC) 12/12/2023   Class 3 severe obesity with body mass index (BMI) of 40.0 to 44.9 in adult [facility]) 12/12/2023   Chronic hip pain (2ry area of Pain) (Bilateral) 12/12/2023   Chronic feet pain (3ry area of Pain) (Bilateral) 12/12/2023   Burning pain 12/12/2023   Chronic painful diabetic neuropathy (HCC) 12/12/2023   Chronic low back pain (1ry area of Pain) (Bilateral) w/o sciatica 12/12/2023   Chronic pain syndrome 06/27/2023   Pharmacologic therapy 06/27/2023   Disorder of skeletal system 06/27/2023   Problems influencing health status 06/27/2023   Abnormal MRI, lumbar spine (11/20/2022) 06/27/2023   PAD (peripheral artery disease) (HCC) 09/03/2022   Claudication of lower extremities (Bilateral) (HCC) 09/03/2022   Critical limb ischemia of lower extremities (Bilateral) (HCC)  Cervical spondylosis without myelopathy 01/16/2021   Disorder of bursae of shoulder region 01/16/2021   Right bundle branch block 09/18/2020   Skin macule or macular rash 09/18/2020   Vulvar itching 09/18/2020   Acute bronchitis with asthma with acute exacerbation 09/16/2020   Peripheral neuropathy 09/16/2020   Pancreatitis 09/16/2020   Anxiety and depression  09/16/2020   Insulin dependent type 2 diabetes mellitus (HCC) 09/16/2020   Benign essential hypertension 07/31/2020   Hematuria 07/31/2020   Proteinuria 07/31/2020   Type II or unspecified type diabetes mellitus with renal manifestations, not stated as uncontrolled 07/31/2020   Acute pancreatitis 04/28/2020   Diabetes mellitus with hyperglycemia (HCC)    Dehydration    Depression    Bronchitis 11/26/2018   Cervical radiculopathy 08/14/2018   Type 2 diabetes mellitus with hyperglycemia (HCC) 06/20/2018   Breast abscess of female 03/29/2018   Transient ischemic attack 08/05/2017   Slurred speech 07/27/2017   Dizziness 02/22/2017   Long term current use of insulin (HCC) 11/01/2013   Type 2 diabetes mellitus with diabetic neuropathy (HCC) 04/18/2013   Hyperlipidemia 07/19/2008   Hypertension 10/25/2007    PCP: [facility]  REFERRING PROVIDER: Delano Metz, MD  REFERRING DIAG: M25.551,M25.552,G89.29 (ICD-10-CM) - Chronic hip pain, bilateral  Rationale for Evaluation and Treatment: Rehabilitation  THERAPY DIAG:  Pain in right hip  Pain in left hip  Chronic bilateral low back pain without sciatica  Muscle weakness (generalized)  Difficulty in walking, not elsewhere classified  ONSET DATE: B hip pain and lower back pain chronic for many years  SUBJECTIVE:                                                                                                                                                                                           SUBJECTIVE STATEMENT: Pt reports having 5/10 NPS in RLE currently. Blood sugar 130 currently.   PERTINENT HISTORY:  Pt reports B LBP where pain begins in lower back and refers into her groin. Sometimes happens bilaterally but R side is more painful than L side. Has history of LE neuropathy from feet to her knees. Pain is stiff and tight bilaterally. Pain feels deep. Pain begins in lower back then refers to hips.  Laying down improves LBP  but hurts her hips. Standing aggravates hips and lower back after about 5 minutes. Pain worst in the morning and improves with motion. Has hx of ESI's in lower back but did not provide relief. Worst pain 8-9/10 NPS on average. Best pain 4/10 NPS. Cold weather makes her pain worse. Many near falls but no falls. Hurts with any standing and walking  tasks. B hip pain worsens with laying down.  PAIN:  Are you having pain? Yes: NPRS scale: 5/10 NPS Pain location: across lower back, central lower back, B hips into groins Pain description: tight, achey Aggravating factors: laying down, sitting, standing, walking Relieving factors: N/A  PRECAUTIONS: Fall  RED FLAGS: None   WEIGHT BEARING RESTRICTIONS: No  FALLS:  Has patient fallen in last 6 months? No  LIVING ENVIRONMENT: Lives with: lives with their family and lives with their spouse Lives in: House/apartment Stairs: Yes: External: 5 steps; bilateral but cannot reach both Has following equipment at home: Quad cane small base, Walker - 4 wheeled, and bed side commode  OCCUPATION: Disability/retired  PLOF: Independent  PATIENT GOALS: Be able to more active and independent.  NEXT MD VISIT: 01/12/24  OBJECTIVE:  Note: Objective measures were completed at Evaluation unless otherwise noted.  DIAGNOSTIC FINDINGS:  Pending MRI in EMR. Will post results when available.   PATIENT SURVEYS:  Modified Oswestry 74%   COGNITION: Overall cognitive status: Within functional limits for tasks assessed     SENSATION: Light touch: WFL. Impaired at feet due to neuropathy.  POSTURE: rounded shoulders, forward head, decreased lumbar lordosis, and increased thoracic kyphosis  PALPATION: TTP at R sided SIJ, lumbar paraspinals L5-S1 and upper gluteals on R and L. TTP at bilateral greater trochanters.   LUMBAR ROM:   AROM eval  Flexion 20%  Extension 10%*  Right lateral flexion 50%*  Left lateral flexion 50%*  Right  rotation 80%  Left rotation 80%   (Blank rows = not tested)  LOWER EXTREMITY ROM:     Active  Right eval Left eval  Hip flexion 75 75  Hip extension 0 (10* PROM) 0 (15* PROM)  Hip abduction    Hip adduction    Hip internal rotation 25* 20*  Hip external rotation 20 25*  Knee flexion    Knee extension    Ankle dorsiflexion    Ankle plantarflexion    Ankle inversion    Ankle eversion     (Blank rows = not tested)  LOWER EXTREMITY MMT:    MMT Right eval Left eval  Hip flexion 4 4  Hip extension 2 2  Hip abduction 3 3  Hip adduction    Hip internal rotation 4* 4*  Hip external rotation 4* 4*  Knee flexion 4 4  Knee extension 4 4  Ankle dorsiflexion 3+ 3+  Ankle plantarflexion    Ankle inversion    Ankle eversion     (Blank rows = not tested)  LUMBAR SPECIAL TESTS:  FABER test: Negative FADDIR: Positive bilaterally  HIP/SIJ SPECIAL TESTS: Sacral thrust: positive Hip scour: positive R and L Hip distraction: relief on LLE but not RLE.  FUNCTIONAL TESTS:  Timed up and go (TUG): 38.5 seconds   5xSTS: deferred to next session  JOINT MOBILITY: Femoroacetabular AP hypomobile with concordant pain sign bilaterally.   GAIT: Distance walked: 10' Assistive device utilized: Counselling psychologist Level of assistance: CGA Comments: Decreased step lengths and R sided antalgic gait. Limited hip extension bialterally with limited step through gait.   TREATMENT DATE: 01/24/24   There.Act: STS in chair with TRX straps scap retraction in standing: 2x6, SBA.   Side stepping along cubicle with 2# AW's on: x6 laps, light BUE support, SBA.   2x4 asc/desc steps. Each set leading with R then L LE. BUE support, supervision.  Gait 50' using QC. VC's for foot clearance and increasing gait speed, CGA.  There.Ex:  Seated, alternating LAQ's: 2# AW's, 2x6 during seated rest breaks.   Standing heel raises with BUE support: 2x8. VC's for form/technique, full range of  motion.   PATIENT EDUCATION:  Education details: POC, prognosis Person educated: Patient Education method: Explanation Education comprehension: verbalized understanding  HOME EXERCISE PROGRAM: Access Code: [email or web address] URL: [email or web address] Date: 12/19/2023 Prepared by: Ronnie Derby  Exercises - Supine Lower Trunk Rotation  - 1 x daily - 4-5 x weekly - 2 sets - 12 reps - Supine Posterior Pelvic Tilt  - 1 x daily - 4-5 x weekly - 2 sets - 12 reps - Sit to Stand  - 1 x daily - 3-4 x weekly - 3 sets - 6 reps   ASSESSMENT:  CLINICAL IMPRESSION: Continuing PT POC address functional weakness. Utilization of compound exercises to address multiple areas of weakness and pain. Pt with good energy levels today and able to tolerate session with reduced sitting rest breaks and greater standing rest breaks instead. Continuing to encourage improved gait speed and mechanics in home to reduce falls risk and improve functional capacity for household and short community errands. Pt remains with moderate pain, poor standing and gait endurance, and Le weakness leading to increased falls risk and need for assist with ADL's and IADL's. Pt will benefit from skilled PT services to address these deficits and maximize independence with functional mobility and reduce falls risk.   OBJECTIVE IMPAIRMENTS: Abnormal gait, decreased activity tolerance, decreased mobility, difficulty walking, decreased ROM, decreased strength, hypomobility, postural dysfunction, obesity, and pain.   ACTIVITY LIMITATIONS: carrying, lifting, bending, sitting, standing, squatting, sleeping, stairs, transfers, bed mobility, bathing, dressing, hygiene/grooming, and locomotion level  PARTICIPATION LIMITATIONS: cleaning, laundry, and community activity  PERSONAL FACTORS: Age, Behavior pattern, Fitness, Past/current experiences, Time since onset of injury/illness/exacerbation, and 3+ comorbidities: DM, Fibromyalgia, HTN,  Peripheral Neuropathy, PAD, depression/anxiety, HLD  are also affecting patient's functional outcome.   REHAB POTENTIAL: Poor Chronicity of symptoms, significant PMH  CLINICAL DECISION MAKING: Evolving/moderate complexity  EVALUATION COMPLEXITY: Moderate   GOALS: Goals reviewed with patient? No  SHORT TERM GOALS: Target date: 01/12/24  Pt will be independent with HEP to improve lumbar and hip AROM and LE strength. Baseline: 12/15/23: provide at next session; 01/11/24: intermittent compliance Goal status: Partially met  LONG TERM GOALS: Target date: 02/09/24  Pt will improve mODI by at least 13 points to demonstrate clinically significant reduction in disability due to her LBP.  Baseline: 12/15/23: 74% Goal status: INITIAL  2.  Pt will improve TUG to 20 seconds or less to demonstrate improved independence in transfers and reduced falls risk with household and community ADL's. Baseline: 12/15/23: 38.5 seconds Goal status: INITIAL  3.  Pt will improve 5xSTS to aged matched norms of 11.4 seconds or less to demonstrate clinically significant improvement in leg strength.  Baseline: 12/15/23: deferred to next session Goal status: INITIAL  4.  Pt will report worst pain as a 6/10 NPS or less to demonstrate clinically significant reduction in hip and lower back pain.  Baseline: 12/15/23: ranges from 8-10/10 at worst Goal status: INITIAL  5.  Pt will improve R/L hip flexion, IR, and ER by at least 5 degrees in all planes to assist in reduction of joint related pain. Baseline: 12/15/23:  Hip flexion 75 75  Hip extension    Hip abduction    Hip adduction    Hip internal rotation 25* 20*  Hip external rotation 20 25*    PLAN:  PT FREQUENCY: 1-2x/week  PT DURATION: 8 weeks  PLANNED INTERVENTIONS: 97164- PT Re-evaluation, 97110-Therapeutic exercises, 97530- Therapeutic activity, [id number]- Neuromuscular re-education, 97535- Self Care, [id number]- Manual therapy, [id number]- Gait training, 97014-  Electrical stimulation (unattended), [id number]- Electrical stimulation (manual), [id number]- Traction (mechanical), Patient/Family education, Balance training, Stair training, Dry Needling, Joint mobilization, Joint manipulation, Spinal manipulation, Spinal mobilization, DME instructions, Cryotherapy, and Moist heat.  PLAN FOR NEXT SESSION: Standing, and gait tasks. Transfers from progressively lower surfaces. Compound strength exercises in pain free ranges. Introducing obstacle navigation with QC.   Delphia Grates. Fairly IV, PT, DPT Physical Therapist- [place]  [facility]  01/24/2024, 2:45 PM

## 2024-01-24 NOTE — Progress Notes (Unsigned)
 PROVIDER NOTE: Information contained herein reflects review and annotations entered in association with encounter. Interpretation of such information and data should be left to medically-trained personnel. Information provided to patient can be located elsewhere in the medical record under "Patient Instructions". Document created using STT-dictation technology, any transcriptional errors that may result from process are unintentional.    Patient: Kristie Olson  Service Category: E/M  Provider: Oswaldo Done, MD  DOB: Mar 25, 1956  DOS: 01/25/2024  Referring Provider: [facility]  MRN: [id number]  Specialty: Interventional Pain Management  PCP: [facility]  Type: Established Patient  Setting: Ambulatory outpatient    Location: Office  Delivery: Face-to-face     Primary Reason(s) for Visit: Encounter for evaluation before starting new chronic pain management plan of care (Level of risk: moderate) CC: Peripheral Neuropathy  HPI  Kristie Olson is a 68 y.o. year old, female patient, who comes today for a follow-up evaluation to review the test results and decide on a treatment plan. She has Slurred speech; Breast abscess of female; Bronchitis; Acute pancreatitis; Hypertension; Diabetes mellitus with hyperglycemia (HCC); Dehydration; Depression; Acute bronchitis with asthma with acute exacerbation; Benign essential hypertension; Cervical radiculopathy; Cervical spondylosis without myelopathy; Dizziness; Hematuria; Hyperlipidemia; Long term current use of insulin (HCC); Peripheral neuropathy; Proteinuria; Right bundle branch block; Skin macule or macular rash; Transient ischemic attack; Type 2 diabetes mellitus with diabetic neuropathy (HCC); Type 2 diabetes mellitus with hyperglycemia (HCC); Vulvar itching; Pancreatitis; Anxiety and depression; Type II or unspecified type diabetes mellitus with renal manifestations, not stated as uncontrolled; Insulin dependent type 2 diabetes  mellitus (HCC); Other specified joint disorders, unspecified shoulder; PAD (peripheral artery disease) (HCC); Claudication of lower extremities (Bilateral) (HCC); Critical limb ischemia of lower extremities (Bilateral) (HCC); Chronic pain syndrome; Pharmacologic therapy; Disorder of skeletal system; Problems influencing health status; Abnormal MRI, lumbar spine (11/20/2022); Lumbar facet arthropathy; Lumbar facet joint pain; Osteoarthritis of facet joint of lumbar spine; Osteoarthritis of lumbar spine; Spondylosis without myelopathy or radiculopathy, lumbosacral region; Chronic anticoagulation (Plavix); Atherosclerotic peripheral vascular disease (HCC); Class 3 severe obesity with body mass index (BMI) of 40.0 to 44.9 in adult [facility]); Chronic hip pain (2ry area of Pain) (Bilateral); Chronic feet pain (3ry area of Pain) (Bilateral); Burning pain; Chronic painful diabetic neuropathy (HCC); Chronic low back pain (1ry area of Pain) (Bilateral) w/o sciatica; Vitamin D deficiency; Influenza A; Acute respiratory failure with hypoxia (HCC); Tobacco use disorder; Obesity, Class III, BMI 40-49.9 (morbid obesity) (HCC); Lumbar facet hypertrophy (Multilevel) (Bilateral); and Vitamin B12 deficiency on their problem list. Her primarily concern today is the Peripheral Neuropathy  Pain Assessment: Location: Lower Back Radiating: lower back to feet, includes groin Onset: More than a month ago Duration: Neuropathic pain Quality: Sharp, Aching, Squeezing, Constant, Other (Comment), Numbness (my feet feel heavy, "I can't tell if my shoes are on or off") Severity: 6 /10 (subjective, self-reported pain score)  Effect on ADL: prolonged walking, sleeping, adls Timing: Constant Modifying factors: rest BP: (!) 148/83  HR: 84  Kristie Olson comes in today for a follow-up visit after her initial evaluation on 01/02/2024. Today we went over the results of her tests. These were explained in "Layman's terms". During today's appointment  we went over my diagnostic impression, as well as the proposed treatment plan.  Review of initial evaluation (12/12/2023): "The patient, with a history of peripheral vascular disease and diabetes, presented to the pain clinic for the first time in over a decade. The primary complaint was severe lower back  pain that radiates to the groin area, exacerbated by standing and certain movements. The pain was described as being on both sides of the spine and extending into the groin area. The patient reported that the pain subsides when sitting, but certain movements can trigger it. Lying down was reported to cause mild pain, but the most severe pain was experienced when standing (clear association with weightbearing) (BMI= 40.43 kg/m).   The patient also reported a burning sensation and pain in both feet, which was described as constant. The distal portion of the patient's legs, from the knee down, was reported to be numb. This numbness and burning sensation in the feet started in the year 2024. The patient also reported numbness in the back of both legs, but not much pain unless walking or standing for extended periods.   The patient had a history of stent placements in both legs due to peripheral vascular disease.  The patient is also on Plavix anticoagulation.  The most recent check on the stents was in December of the previous year, and they were reported to be working. The patient was on daily Plavix, prescribed by her vascular surgeon, since her first surgery.   The patient had previously received injections for back pain, which were reported to have provided no relief. The patient had not had any back surgeries or physical therapy for her back or leg pain. The patient had a home visit from a physical therapist the previous year.   The patient's weight was reported to fluctuate, and she expressed a desire to lose weight to alleviate back pain. The patient's current weight was 207 pounds, and her height was  5 feet 3/4 inches. The patient was also a diabetic and was on insulin.   The patient's goal was to be able to walk around and do activities without pain. She expressed frustration at her current limitations due to pain and was eager for any treatment that could provide relief."  Review of diagnostic studies ordered on 12/12/2023:  Diagnostic lab work: Lab work demonstrated the patient to have a vitamin D deficiency at 13.48 ng/mL (normal 30-100).  Vitamin B12 levels were also demonstrated to be low at 151 pg/mL (normal 182-914) (vitamin B12 deficiency).  In addition a recent basic metabolic panel demonstrated the patient's potassium to be low at 2.9.  And a hemoglobin A1c of (6th area of Pain) Diagnostic imaging: Diagnostic x-rays of both hips demonstrated mild bilateral osteoarthritis with the right being greater than the left (R>L).  Diagnostic x-rays of the lumbar spine with bending views demonstrated a transitional lumbosacral anatomy with sacralization of L5.  Moderate multilevel facet hypertrophy.  Slight L3-4 disc space narrowing.  In addition, on 12/12/2023 referrals were sent to physical therapy and neurology for EMG/PNCV of lower extremities.  In addition a request for clearance to stop her Plavix was also sent.  History of Present Illness   The patient presents for chronic pain management and follow-up after recent hospitalization for the flu.  She has a history of poor blood flow to the legs and is on Plavix. She also has diabetes and experiences severe low back pain radiating to both groin areas, which worsens with standing and weight-bearing activities. Her BMI is 40.4, which may contribute to her pain. She has undergone multiple injections for pain management, including bilateral L5 S1 epidural steroid injections and bilateral L3 through L5 medial branch blocks, with limited relief.  She describes persistent pain in her legs and feet, including a burning  sensation and heaviness, which  sometimes keeps her awake at night. Her feet hurt so badly that she cries at night. She has been informed of severe nerve damage in her legs, but is awaiting an official report from a neurologist.  X-rays have confirmed osteoarthritis in both hips, with the right hip being worse. She also has a transitional lumbosacral anatomy and multilevel bilateral facet hypertrophy, which may contribute to her pain. Her pain is multifactorial, involving different areas of her body.  She has a vitamin D deficiency with a level of 13.48, which may exacerbate her pain.  She reports a recent hospitalization due to a severe case of the flu, which has since resolved. She is currently breathing well, and her cough and wheezing have subsided.      Patient presented with interventional treatment options. Ms. Laubscher was informed that I will not be providing medication management. Pharmacotherapy evaluation including recommendations may be offered, if specifically requested.   Controlled Substance Pharmacotherapy Assessment REMS (Risk Evaluation and Mitigation Strategy)  Opioid Analgesic: No chronic opioid analgesics therapy prescribed by our practice. None MME/day: 0 mg/day  Pill Count: None expected due to no prior prescriptions written by our practice. No notes on file Pharmacokinetics: Liberation and absorption (onset of action): WNL Distribution (time to peak effect): WNL Metabolism and excretion (duration of action): WNL         Pharmacodynamics: Desired effects: Analgesia: Ms. Gelber reports >50% benefit. Functional ability: Patient reports that medication allows her to accomplish basic ADLs Clinically meaningful improvement in function (CMIF): Sustained CMIF goals met Perceived effectiveness: Described as relatively effective, allowing for increase in activities of daily living (ADL) Undesirable effects: Side-effects or Adverse reactions: None reported Monitoring: [place] PMP: PDMP reviewed during this  encounter. Online review of the past 67-month period previously conducted. Not applicable at this point since we have not taken over the patient's medication management yet. List of other Serum/Urine Drug Screening Test(s):  No results found for: "AMPHSCRSER", "BARBSCRSER", "BENZOSCRSER", "COCAINSCRSER", "COCAINSCRNUR", "PCPSCRSER", "THCSCRSER", "THCU", "CANNABQUANT", "OPIATESCRSER", "OXYSCRSER", "PROPOXSCRSER", "ETH", "CBDTHCR", "D8THCCBX", "D9THCCBX" List of all UDS test(s) done:  Lab Results  Component Value Date   SUMMARY FINAL 12/12/2023   Last UDS on record: Summary  Date Value Ref Range Status  12/12/2023 FINAL  Final    Comment:    ==================================================================== Compliance Drug Analysis, Ur ==================================================================== Test                             Result       Flag       Units  Drug Present and Declared for Prescription Verification   Fluoxetine                     PRESENT      EXPECTED   Norfluoxetine                  PRESENT      EXPECTED    Norfluoxetine is an expected metabolite of fluoxetine.    Acetaminophen                  PRESENT      EXPECTED  Drug Present not Declared for Prescription Verification   Diphenhydramine                PRESENT      UNEXPECTED  Drug Absent but Declared for Prescription Verification   Tizanidine  Not Detected UNEXPECTED    Tizanidine, as indicated in the declared medication list, is not    always detected even when used as directed.    Amitriptyline                  Not Detected UNEXPECTED   Salicylate                     Not Detected UNEXPECTED    Aspirin, as indicated in the declared medication list, is not always    detected even when used as directed.  ==================================================================== Test                      Result    Flag   Units      Ref Range   Creatinine              51                mg/dL      >=16 ==================================================================== Declared Medications:  The flagging and interpretation on this report are based on the  following declared medications.  Unexpected results may arise from  inaccuracies in the declared medications.   **Note: The testing scope of this panel includes these medications:   Amitriptyline (Elavil)  Fluoxetine (Prozac)   **Note: The testing scope of this panel does not include small to  moderate amounts of these reported medications:   Acetaminophen (Tylenol)  Aspirin  Tizanidine (Zanaflex)   **Note: The testing scope of this panel does not include the  following reported medications:   Albuterol (Ventolin HFA)  Atorvastatin (Lipitor)  Clopidogrel (Plavix)  Esomeprazole (Nexium)  Fluticasone (Advair)  Glipizide  Hydrochlorothiazide (Hyzaar)  Insulin  Losartan (Hyzaar)  Pantoprazole (Protonix)  Salmeterol (Advair)  Triamcinolone (Kenalog) ==================================================================== For clinical consultation, please call [phone]. ====================================================================    UDS interpretation: No unexpected findings.          Medication Assessment Form: Not applicable. No opioids. Treatment compliance: Not applicable Risk Assessment Profile: Aberrant behavior: See initial evaluations. None observed or detected today Comorbid factors increasing risk of overdose: See initial evaluation. No additional risks detected today Opioid risk tool (ORT):     12/12/2023   11:26 AM  Opioid Risk   Alcohol 3  Opioid Risk Tool Scoring 3  Opioid Risk Interpretation Low Risk    ORT Scoring interpretation table:  Score <3 = Low Risk for SUD  Score between 4-7 = Moderate Risk for SUD  Score >8 = High Risk for Opioid Abuse   Risk of substance use disorder (SUD): Low  Risk Mitigation Strategies:  Patient opioid safety counseling: No controlled  substances prescribed. Patient-Prescriber Agreement (PPA): No agreement signed.  Controlled substance notification to other providers: None required. No opioid therapy.  Pharmacologic Plan: Non-opioid analgesic therapy offered. Interventional alternatives discussed.             Laboratory Chemistry Profile   Renal Lab Results  Component Value Date   BUN 15 12/31/2023   CREATININE 0.75 12/31/2023   GFRAA >60 07/01/2020   GFRNONAA >60 12/31/2023   PROTEINUR 100 (A) 02/14/2023     Electrolytes Lab Results  Component Value Date   NA 138 12/31/2023   K 3.3 (L) 12/31/2023   CL 99 12/31/2023   CALCIUM 9.1 12/31/2023   MG 1.9 12/31/2023     Hepatic Lab Results  Component Value Date   AST 20 12/12/2023   ALT 14 12/12/2023  ALBUMIN 4.1 12/12/2023   ALKPHOS 81 12/12/2023   LIPASE 69 (H) 04/28/2020     ID Lab Results  Component Value Date   HIV Non Reactive 12/29/2023   SARSCOV2NAA NEGATIVE 12/29/2023     Bone Lab Results  Component Value Date   VD25OH 13.48 (L) 12/12/2023     Endocrine Lab Results  Component Value Date   GLUCOSE 138 (H) 12/31/2023   GLUCOSEU NEGATIVE 02/14/2023   HGBA1C 9.6 (H) 12/29/2023     Neuropathy Lab Results  Component Value Date   VITAMINB12 151 (L) 12/12/2023   HGBA1C 9.6 (H) 12/29/2023   HIV Non Reactive 12/29/2023     CNS No results found for: "COLORCSF", "APPEARCSF", "RBCCOUNTCSF", "WBCCSF", "POLYSCSF", "LYMPHSCSF", "EOSCSF", "PROTEINCSF", "GLUCCSF", "JCVIRUS", "CSFOLI", "IGGCSF", "LABACHR", "ACETBL"   Inflammation (CRP: Acute  ESR: Chronic) Lab Results  Component Value Date   CRP 0.5 12/12/2023   ESRSEDRATE 28 12/12/2023     Rheumatology No results found for: "RF", "ANA", "LABURIC", "URICUR", "LYMEIGGIGMAB", "LYMEABIGMQN", "HLAB27"   Coagulation Lab Results  Component Value Date   INR 1.0 09/02/2022   LABPROT 13.0 09/02/2022   APTT 36 09/02/2022   PLT 393 12/31/2023     Cardiovascular Lab Results  Component  Value Date   TROPONINI <0.03 11/26/2018   HGB 13.1 12/31/2023   HCT 38.1 12/31/2023     Screening Lab Results  Component Value Date   SARSCOV2NAA NEGATIVE 12/29/2023   HIV Non Reactive 12/29/2023     Cancer No results found for: "CEA", "CA125", "LABCA2"   Allergens No results found for: "ALMOND", "APPLE", "ASPARAGUS", "AVOCADO", "BANANA", "BARLEY", "BASIL", "BAYLEAF", "GREENBEAN", "LIMABEAN", "WHITEBEAN", "BEEFIGE", "REDBEET", "BLUEBERRY", "BROCCOLI", "CABBAGE", "MELON", "CARROT", "CASEIN", "CASHEWNUT", "CAULIFLOWER", "CELERY"     Note: Lab results reviewed.  Recent Diagnostic Imaging Review  Lumbosacral Imaging: Lumbar MR wo contrast: Results for orders placed during the hospital encounter of 11/18/22 MR LUMBAR SPINE WO CONTRAST  Narrative CLINICAL DATA:  Low back pain extending into both legs for 1-2 years.  EXAM: MRI LUMBAR SPINE WITHOUT CONTRAST  TECHNIQUE: Multiplanar, multisequence MR imaging of the lumbar spine was performed. No intravenous contrast was administered.  COMPARISON:  None Available.  FINDINGS: Segmentation: Transitional anatomy with sacralization of the L5 vertebral body.  Alignment:  Physiologic.  Vertebrae: No acute fracture, evidence of discitis, or aggressive bone lesion.  Conus medullaris and cauda equina: Conus extends to the L1 level. Conus and cauda equina appear normal.  Paraspinal and other soft tissues: No acute paraspinal abnormality.  Disc levels:  Disc spaces: Disc desiccation throughout the lumbar spine. No significant disc height loss.  T12-L1: No significant disc bulge. No neural foraminal stenosis. No central canal stenosis. Mild bilateral facet arthropathy.  L1-L2: No significant disc bulge. No neural foraminal stenosis. No central canal stenosis. Mild bilateral facet arthropathy.  L2-L3: No significant disc bulge. No neural foraminal stenosis. No central canal stenosis. Mild-moderate bilateral facet  arthropathy.  L3-L4: Mild broad-based disc bulge. Moderate bilateral facet arthropathy, right worse than left. Mild spinal stenosis. Mild right foraminal stenosis. No left foraminal stenosis.  L4-L5: Mild broad-based disc bulge. Moderate left and mild right facet arthropathy. No foraminal or central canal stenosis.  L5-S1: No significant disc bulge. No neural foraminal stenosis. No central canal stenosis.  IMPRESSION: 1. Mild lumbar spine spondylosis as described above. 2. No acute osseous injury of the lumbar spine.   Electronically Signed By: Elige Ko M.D. On: 11/20/2022 08:46  Lumbar DG Bending views: Results for orders placed during the hospital  encounter of 12/12/23 DG Lumbar Spine Complete W/Bend  Narrative CLINICAL DATA:  Low back pain.  EXAM: LUMBAR SPINE - COMPLETE WITH BENDING VIEWS  COMPARISON:  Lumbar MRI 11/18/2022  FINDINGS: L5 is sacralized with 4 true non rib-bearing lumbar vertebra. Small L5-S1 disc space. The alignment is normal. No change in alignment on flexion or extension. Vertebral body heights are normal. Moderate multilevel facet hypertrophy. Slight L3-L4 disc space narrowing. No visible pars defects. Aortic atherosclerosis.  IMPRESSION: 1. 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 2. Moderate multilevel facet hypertrophy. 3. Slight L3-L4 disc space narrowing.   Electronically Signed By: Narda Rutherford M.D. On: 12/20/2023 10:53  Hip Imaging: Hip-B DG Bilateral (5V): Results for orders placed during the hospital encounter of 12/12/23 DG HIPS BILAT W OR W/O PELVIS MIN 5 VIEWS  Narrative CLINICAL DATA:  Chronic bilateral hip pain.  EXAM: DG HIP (WITH OR WITHOUT PELVIS) 5+V BILAT  COMPARISON:  None Available.  FINDINGS: Mild right hip joint space narrowing. There is moderate bilateral acetabular spurring. No fracture. No evidence of erosion or avascular necrosis. No focal bone lesion or bone destruction.  The pubic symphysis and sacroiliac joints are congruent, mild degenerative change. Bilateral tubal ligation clips in the pelvis. Bilateral lower extremity vascular stents.  IMPRESSION: Mild osteoarthritis of both hips, right greater than left.   Electronically Signed By: Narda Rutherford M.D. On: 12/20/2023 10:54  Complexity Note: Imaging results reviewed.                         Meds   Current Outpatient Medications:    acetaminophen (TYLENOL) 325 MG tablet, Take 2 tablets (650 mg total) by mouth every 6 (six) hours as needed for mild pain (or Fever >/= 101)., Disp: , Rfl:    ADVAIR DISKUS 250-50 MCG/ACT AEPB, Inhale 1 puff into the lungs in the morning and at bedtime., Disp: , Rfl:    albuterol (PROVENTIL HFA;VENTOLIN HFA) 108 (90 Base) MCG/ACT inhaler, Inhale 2 puffs into the lungs every 4 (four) hours as needed for wheezing or shortness of breath., Disp: , Rfl:    amitriptyline (ELAVIL) 100 MG tablet, Take 10 mg by mouth at bedtime as needed for sleep., Disp: , Rfl:    aspirin EC 81 MG tablet, Take 1 tablet (81 mg total) by mouth daily. Swallow whole., Disp: 30 tablet, Rfl: 0   Aspirin-Salicylamide-Caffeine (BC HEADACHE POWDER PO), Take by mouth., Disp: , Rfl:    atorvastatin (LIPITOR) 10 MG tablet, Take 1 tablet (10 mg total) by mouth daily., Disp: 30 tablet, Rfl: 0   Cholecalciferol (VITAMIN D3) 125 MCG (5000 UT) CAPS, Take 1 capsule (5,000 Units total) by mouth daily with breakfast. Take along with calcium and magnesium., Disp: 90 capsule, Rfl: 0   clopidogrel (PLAVIX) 75 MG tablet, Take 1 tablet (75 mg total) by mouth daily., Disp: 30 tablet, Rfl: 0   Continuous Blood Gluc Sensor (FREESTYLE LIBRE 2 SENSOR) MISC, USE AS DIRECTED EVERY 2 WEEKS, Disp: , Rfl:    [START ON 01/26/2024] ergocalciferol (VITAMIN D2) 1.25 MG (50000 UT) capsule, Take 1 capsule (50,000 Units total) by mouth 2 (two) times a week. X 6 weeks., Disp: 12 capsule, Rfl: 0   FLUoxetine (PROZAC) 40 MG capsule, Take 1  capsule by mouth daily., Disp: , Rfl:    gabapentin (NEURONTIN) 400 MG capsule, Take 400 mg by mouth 3 (three) times daily., Disp: , Rfl:    glipiZIDE (GLUCOTROL) 10 MG tablet, Take 10  mg by mouth daily as needed., Disp: , Rfl:    insulin NPH-regular Human (70-30) 100 UNIT/ML injection, Inject 25-70 Units into the skin in the morning and at bedtime. 70 qam 25 qhs, Disp: , Rfl:    lisinopril-hydrochlorothiazide (ZESTORETIC) 20-25 MG tablet, Take 1 tablet by mouth daily., Disp: , Rfl:    TRADJENTA 5 MG TABS tablet, Take 5 mg by mouth daily., Disp: , Rfl:   ROS  Constitutional: Denies any fever or chills Gastrointestinal: No reported hemesis, hematochezia, vomiting, or acute GI distress Musculoskeletal: Denies any acute onset joint swelling, redness, loss of ROM, or weakness Neurological: No reported episodes of acute onset apraxia, aphasia, dysarthria, agnosia, amnesia, paralysis, loss of coordination, or loss of consciousness  Allergies  Ms. Vallery is allergic to ibuprofen, nsaids, and percocet [oxycodone-acetaminophen].  PFSH  Drug: Ms. Laverdiere  reports that she does not currently use drugs. Alcohol:  reports no history of alcohol use. Tobacco:  reports that she has been smoking cigarettes. She has a 46 pack-year smoking history. She has never used smokeless tobacco. Medical:  has a past medical history of Asthma, Coronary artery disease, Diabetes mellitus without complication (HCC) (1990), Fibromyalgia, Hypertension, and PAD (peripheral artery disease) (HCC) (09/03/2022). Surgical: Ms. Serratore  has a past surgical history that includes Cesarean section; Breast mass excision (Left); Cholecystectomy (2009); Breast excisional biopsy (Left, 1980's); Lower Extremity Angiography (Left, 09/03/2022); Lower Extremity Angiography (Right, 09/08/2022); Lower Extremity Angiography (Left, 01/18/2023); Lower Extremity Angiography (Left, 02/14/2023); and Lower Extremity Angiography (Left, 06/27/2023). Family: family  history includes Breast cancer in her paternal aunt; Breast cancer (age of onset: 66) in her sister; Cancer in her father, mother, and sister; Diabetes in her mother.  Constitutional Exam  General appearance: Well nourished, well developed, and well hydrated. In no apparent acute distress Vitals:   01/25/24 1104  BP: (!) 148/83  Pulse: 84  Resp: 17  Temp: 98.1 F (36.7 C)  TempSrc: Temporal  SpO2: 96%  Weight: 210 lb (95.3 kg)  Height: 5' (1.524 m)   BMI Assessment: Estimated body mass index is 41.01 kg/m as calculated from the following:   Height as of this encounter: 5' (1.524 m).   Weight as of this encounter: 210 lb (95.3 kg).  BMI interpretation table: BMI level Category Range association with higher incidence of chronic pain  <18 kg/m2 Underweight   18.5-24.9 kg/m2 Ideal body weight   25-29.9 kg/m2 Overweight Increased incidence by 20%  30-34.9 kg/m2 Obese (Class I) Increased incidence by 68%  35-39.9 kg/m2 Severe obesity (Class II) Increased incidence by 136%  >40 kg/m2 Extreme obesity (Class III) Increased incidence by 254%   Patient's current BMI Ideal Body weight  Body mass index is 41.01 kg/m. Ideal body weight: 45.5 kg (100 lb 4.9 oz) Adjusted ideal body weight: 65.4 kg (144 lb 3 oz)   BMI Readings from Last 4 Encounters:  01/25/24 41.01 kg/m  12/29/23 40.43 kg/m  12/12/23 40.43 kg/m  08/03/23 40.43 kg/m   Wt Readings from Last 4 Encounters:  01/25/24 210 lb (95.3 kg)  12/29/23 207 lb (93.9 kg)  12/12/23 207 lb (93.9 kg)  08/03/23 207 lb (93.9 kg)    Psych/Mental status: Alert, oriented x 3 (person, place, & time)       Eyes: PERLA Respiratory: No evidence of acute respiratory distress  Assessment & Plan  Primary Diagnosis & Pertinent Problem List: The primary encounter diagnosis was Chronic low back pain (1ry area of Pain) (Bilateral) w/o sciatica. Diagnoses of Chronic hip  pain (2ry area of Pain) (Bilateral), Chronic feet pain (3ry area of Pain)  (Bilateral), Abnormal MRI, lumbar spine (11/20/2022), Lumbar facet joint pain, Lumbar facet hypertrophy (Multilevel) (Bilateral), Lumbar facet arthropathy, Osteoarthritis of facet joint of lumbar spine, Osteoarthritis of lumbar spine, Spondylosis without myelopathy or radiculopathy, lumbosacral region, Type 2 diabetes mellitus with diabetic neuropathy, unspecified whether long term insulin use (HCC), Vitamin B12 deficiency, Vitamin D deficiency, Obesity, Class III, BMI 40-49.9 (morbid obesity) (HCC), and Chronic anticoagulation (Plavix) were also pertinent to this visit. Visit Diagnosis: 1. Chronic low back pain (1ry area of Pain) (Bilateral) w/o sciatica   2. Chronic hip pain (2ry area of Pain) (Bilateral)   3. Chronic feet pain (3ry area of Pain) (Bilateral)   4. Abnormal MRI, lumbar spine (11/20/2022)   5. Lumbar facet joint pain   6. Lumbar facet hypertrophy (Multilevel) (Bilateral)   7. Lumbar facet arthropathy   8. Osteoarthritis of facet joint of lumbar spine   9. Osteoarthritis of lumbar spine   10. Spondylosis without myelopathy or radiculopathy, lumbosacral region   11. Type 2 diabetes mellitus with diabetic neuropathy, unspecified whether long term insulin use (HCC)   12. Vitamin B12 deficiency   13. Vitamin D deficiency   14. Obesity, Class III, BMI 40-49.9 (morbid obesity) (HCC)   15. Chronic anticoagulation (Plavix)    Problems updated and reviewed during this visit: Problem  Other Specified Joint Disorders, Unspecified Shoulder  Type II Or Unspecified Type Diabetes Mellitus With Renal Manifestations, Not Stated As Uncontrolled  Type 2 Diabetes Mellitus With Hyperglycemia (Hcc)   Plan of Care  Assessment and Plan    Chronic low back pain with radiculopathy   Chronic low back pain radiates to the groin bilaterally and worsens with weight-bearing activities. Previous treatments, including bilateral L5-S1 epidural steroid injections and bilateral L3-L5 medial branch block,  provided limited relief. X-rays reveal multilevel bilateral facet hypertrophy and transitional lumbosacral anatomy, contributing to the pain. A BMI of 40.4 may exacerbate spinal and joint arthritis. The pain is likely multifactorial, involving mechanical and inflammatory components. Injections may offer temporary relief but are unlikely to eliminate pain permanently. Weight management and avoiding activities that exacerbate pain are crucial. Plan for three-level transforaminal epidural steroid injections with sedation. She should monitor pain relief during and after numbing medication. Advise avoiding heavy lifting and twisting at the waist. Recommend weight loss and discuss with her primary care physician.  Hip osteoarthritis   X-rays confirm osteoarthritis in both hips, with the right hip worse than the left, likely contributing to groin and leg pain. The pain is multifactorial, involving different body areas. Weight management is important to reduce joint stress. Discuss weight management with her primary care physician.  Peripheral neuropathy   She experiences burning sensations and heaviness in the feet, likely due to diabetes and poor blood flow, possibly related to diabetic neuropathy and peripheral vascular disease. Consider treatment with Qutenza patches for foot pain relief, which may provide relief for up to six months.  Vitamin D deficiency   Lab work shows a vitamin D level of 13.48, which can increase pain perception and affect bone health. Correcting the deficiency is important to reduce pain sensitivity and maintain bone strength. Prescribe prescription-strength vitamin D to be taken twice a week for six weeks, followed by over-the-counter vitamin D supplementation.  Follow-up   She is scheduled for follow-up treatments and evaluations to manage chronic pain and related conditions. Coordination with other specialists is necessary for comprehensive care. Coordinate with the neurologist  for  the official report on nerve conduction tests. Schedule follow-up for transforaminal epidural steroid injections. Ensure communication with the prescribing physician regarding blood thinner management before procedures.      Pharmacotherapy (Medications Ordered): Meds ordered this encounter  Medications   ergocalciferol (VITAMIN D2) 1.25 MG (50000 UT) capsule    Sig: Take 1 capsule (50,000 Units total) by mouth 2 (two) times a week. X 6 weeks.    Dispense:  12 capsule    Refill:  0    Fill one day early if pharmacy is closed on scheduled refill date. May substitute for generic, or similar, if available.   Cholecalciferol (VITAMIN D3) 125 MCG (5000 UT) CAPS    Sig: Take 1 capsule (5,000 Units total) by mouth daily with breakfast. Take along with calcium and magnesium.    Dispense:  90 capsule    Refill:  0    Fill 1 day early if pharmacy is closed on scheduled refill date. Generic permitted. Do not send renewal requests.   Procedure Orders         LUMBAR FACET(MEDIAL BRANCH NERVE BLOCK) MBNB         NEUROLYSIS     Lab Orders  No laboratory test(s) ordered today   Imaging Orders  No imaging studies ordered today   Referral Orders  No referral(s) requested today    Pharmacological management:  Opioid Analgesics: I will not be prescribing any opioids at this time Membrane stabilizer: I will not be prescribing any at this time Muscle relaxant: I will not be prescribing any at this time NSAID: I will not be prescribing any at this time Other analgesic(s): I will not be prescribing any at this time      Interventional Therapies  Risk Factors  Considerations  Medical Comorbidities:  Plavix Anticoagulation: (Stop: 7-10 days  Restart: 2 hours)  CAD  T2IDDM  BA  HTN  RBBB  Anxiety/Depression  FM  MO (BMI>40)          Planned  Pending:   Diagnostic bilateral lumbar facet MBB #1  Therapeutic bilateral Qutenza treatment #1    Under consideration:   Diagnostic  bilateral lumbar facet MBB #1  Therapeutic bilateral Qutenza treatment #1    Completed:   None at this time   Therapeutic  Palliative (PRN) options:   None established   Completed by other providers:   Diagnostic bilateral L3-L5 lumbar facet MBB x1 (04/07/2023) by Filomena Jungling, MD [facility])  Diagnostic bilateral L5-S1 TFESI x2 (12/30/2022, 03/04/2023) by Filomena Jungling, MD [facility])  Referral to neurology for lower extremity EMG/PNCV (diabetic peripheral neuropathy vs ischemic neuropathy) (study apparently done by Dr. Theora Master)  (01/09/2024) [facility] neurology)      Provider-requested follow-up: Return for (ECT): (B) L-FCT Blk #1, (Blood Thinner Protocol). Recent Visits Date Type Provider Dept  12/12/23 Office Visit Delano Metz, MD [facility]  Showing recent visits within past 90 days and meeting all other requirements Today's Visits Date Type Provider Dept  01/25/24 Office Visit Delano Metz, MD [facility]  Showing today's visits and meeting all other requirements Future Appointments Date Type Provider Dept  02/16/24 Appointment Delano Metz, MD [facility]  Showing future appointments within next 90 days and meeting all other requirements   Primary Care Physician: [facility]  Duration of encounter: 62 minutes.  Total time on encounter, as per AMA guidelines included both the face-to-face and non-face-to-face time personally spent by the physician and/or other  qualified health care professional(s) on the day of the encounter (includes time in activities that require the physician or other qualified health care professional and does not include time in activities normally performed by clinical staff). Physician's time may include the following activities when performed: Preparing to see the patient (e.g., pre-charting review of records, searching for previously ordered imaging, lab work, and nerve  conduction tests) Review of prior analgesic pharmacotherapies. Reviewing PMP Interpreting ordered tests (e.g., lab work, imaging, nerve conduction tests) Performing post-procedure evaluations, including interpretation of diagnostic procedures Obtaining and/or reviewing separately obtained history Performing a medically appropriate examination and/or evaluation Counseling and educating the patient/family/caregiver Ordering medications, tests, or procedures Referring and communicating with other health care professionals (when not separately reported) Documenting clinical information in the electronic or other health record Independently interpreting results (not separately reported) and communicating results to the patient/ family/caregiver Care coordination (not separately reported)  Note by: Oswaldo Done, MD (TTS technology used. I apologize for any typographical errors that were not detected and corrected.) Date: 01/25/2024; Time: 12:02 PM

## 2024-01-25 ENCOUNTER — Ambulatory Visit: Payer: 59 | Attending: Pain Medicine | Admitting: Pain Medicine

## 2024-01-25 ENCOUNTER — Encounter: Payer: Self-pay | Admitting: Pain Medicine

## 2024-01-25 VITALS — BP 148/83 | HR 84 | Temp 98.1°F | Resp 17 | Ht 60.0 in | Wt 210.0 lb

## 2024-01-25 DIAGNOSIS — M545 Low back pain, unspecified: Secondary | ICD-10-CM | POA: Diagnosis present

## 2024-01-25 DIAGNOSIS — M25551 Pain in right hip: Secondary | ICD-10-CM | POA: Insufficient documentation

## 2024-01-25 DIAGNOSIS — M79671 Pain in right foot: Secondary | ICD-10-CM | POA: Diagnosis present

## 2024-01-25 DIAGNOSIS — M47817 Spondylosis without myelopathy or radiculopathy, lumbosacral region: Secondary | ICD-10-CM | POA: Insufficient documentation

## 2024-01-25 DIAGNOSIS — M25552 Pain in left hip: Secondary | ICD-10-CM | POA: Diagnosis present

## 2024-01-25 DIAGNOSIS — M79672 Pain in left foot: Secondary | ICD-10-CM | POA: Insufficient documentation

## 2024-01-25 DIAGNOSIS — E538 Deficiency of other specified B group vitamins: Secondary | ICD-10-CM | POA: Insufficient documentation

## 2024-01-25 DIAGNOSIS — M5459 Other low back pain: Secondary | ICD-10-CM | POA: Diagnosis present

## 2024-01-25 DIAGNOSIS — E1140 Type 2 diabetes mellitus with diabetic neuropathy, unspecified: Secondary | ICD-10-CM | POA: Insufficient documentation

## 2024-01-25 DIAGNOSIS — G8929 Other chronic pain: Secondary | ICD-10-CM | POA: Diagnosis present

## 2024-01-25 DIAGNOSIS — R937 Abnormal findings on diagnostic imaging of other parts of musculoskeletal system: Secondary | ICD-10-CM | POA: Insufficient documentation

## 2024-01-25 DIAGNOSIS — E559 Vitamin D deficiency, unspecified: Secondary | ICD-10-CM | POA: Diagnosis present

## 2024-01-25 DIAGNOSIS — Z7901 Long term (current) use of anticoagulants: Secondary | ICD-10-CM | POA: Insufficient documentation

## 2024-01-25 DIAGNOSIS — M47816 Spondylosis without myelopathy or radiculopathy, lumbar region: Secondary | ICD-10-CM | POA: Diagnosis present

## 2024-01-25 MED ORDER — VITAMIN D3 125 MCG (5000 UT) PO CAPS: 5000.0000 [IU] | ORAL_CAPSULE | Freq: Every day | ORAL | 0 refills | Status: AC

## 2024-01-25 MED ORDER — ERGOCALCIFEROL 1.25 MG (50000 UT) PO CAPS: 50000.0000 [IU] | ORAL_CAPSULE | ORAL | 0 refills | Status: AC

## 2024-01-25 NOTE — Patient Instructions (Signed)
 ______________________________________________________________________    Blood Thinners  IMPORTANT NOTICE:  If you take any of these, make sure to notify the nursing staff.  Failure to do so may result in serious injury.  Recommended time intervals to stop and restart blood-thinners, before & after invasive procedures  Generic Name Brand Name Pre-procedure: Stop medication for this amount of time before your procedure: Post-procedure: Wait this amount of time after the procedure before restarting your medication:  Abciximab Reopro 15 days 2 hrs  Alteplase Activase 10 days 10 days  Anagrelide Agrylin    Apixaban Eliquis 3 days 6 hrs  Cilostazol Pletal 3 days 5 hrs  Clopidogrel Plavix 7-10 days 2 hrs  Dabigatran Pradaxa 5 days 6 hrs  Dalteparin Fragmin 24 hours 4 hrs  Dipyridamole Aggrenox 11days 2 hrs  Edoxaban Lixiana; Savaysa 3 days 2 hrs  Enoxaparin  Lovenox 24 hours 4 hrs  Eptifibatide Integrillin 8 hours 2 hrs  Fondaparinux  Arixtra 72 hours 12 hrs  Hydroxychloroquine Plaquenil 11 days   Prasugrel Effient 7-10 days 6 hrs  Reteplase Retavase 10 days 10 days  Rivaroxaban Xarelto 3 days 6 hrs  Ticagrelor Brilinta 5-7 days 6 hrs  Ticlopidine Ticlid 10-14 days 2 hrs  Tinzaparin Innohep 24 hours 4 hrs  Tirofiban Aggrastat 8 hours 2 hrs  Warfarin Coumadin 5 days 2 hrs   Other medications with blood-thinning effects  NOTE: Consider stopping these if you have prolonged bleeding despite not taking any of the above blood thinners. Otherwise ask your provider and this will be decided on a case-by-case basis.  Product indications Generic (Brand) names Note  Cholesterol Lipitor Stop 4 days before procedure  Blood thinner (injectable) Heparin (LMW or LMWH Heparin) Stop 24 hours before procedure  Cancer Ibrutinib (Imbruvica) Stop 7 days before procedure  Malaria/Rheumatoid Hydroxychloroquine (Plaquenil) Stop 11 days before procedure  Thrombolytics  10 days before or after procedures    Over-the-counter (OTC) Products with blood-thinning effects  NOTE: Consider stopping these if you have prolonged bleeding despite not taking any of the above blood thinners. Otherwise ask your provider and this will be decided on a case-by-case basis.  Product Common names Stop Time  Aspirin > 325 mg Goody Powders, Excedrin, etc. 11 days  Aspirin <= 81 mg  7 days  Fish oil  4 days  Garlic supplements  7 days  Ginkgo biloba  36 hours  Ginseng  24 hours  NSAIDs Ibuprofen, Naprosyn, etc. 3 days  Vitamin E  4 days   ______________________________________________________________________     ______________________________________________________________________    Procedure instructions  Stop blood-thinners  Do not eat or drink fluids (other than water) for 6 hours before your procedure  No water for 2 hours before your procedure  Take your blood pressure medicine with a sip of water  Arrive 30 minutes before your appointment  If sedation is planned, bring suitable driver. Pennie Banter, Benedetto Goad, & public transportation are NOT APPROVED)  Carefully read the "Preparing for your procedure" detailed instructions  If you have questions call us at [phone]  Procedure appointments are for procedures only. NO medication refills or new problem evaluations.   ______________________________________________________________________      ______________________________________________________________________    Preparing for your procedure  Appointments: If you think you may not be able to keep your appointment, call 24-48 hours in advance to cancel. We need time to make it available to others.  Procedure visits are for procedures only. During your procedure appointment there will be: NO Prescription Refills*. NO medication  changes or discussions*. NO discussion of disability issues*. NO unrelated pain problem evaluations*. NO evaluations to order other pain procedures*. *These will  be addressed at a separate and distinct evaluation encounter on the provider's evaluation schedule and not during procedure days.  Instructions: Food intake: Avoid eating anything solid for at least 8 hours prior to your procedure. Clear liquid intake: You may take clear liquids such as water up to 2 hours prior to your procedure. (No carbonated drinks. No soda.) Transportation: Unless otherwise stated by your physician, bring a driver. (Driver cannot be a Market researcher, Pharmacist, community, or any other form of public transportation.) Morning Medicines: Except for blood thinners, take all of your other morning medications with a sip of water. Make sure to take your heart and blood pressure medicines. If your blood pressure's lower number is above 100, the case will be rescheduled. Blood thinners: Make sure to stop your blood thinners as instructed.  If you take a blood thinner, but were not instructed to stop it, call our office [phone] and ask to talk to a nurse. Not stopping a blood thinner prior to certain procedures could lead to serious complications. Diabetics on insulin: Notify the staff so that you can be scheduled 1st case in the morning. If your diabetes requires high dose insulin, take only  of your normal insulin dose the morning of the procedure and notify the staff that you have done so. Preventing infections: Shower with an antibacterial soap the morning of your procedure.  Build-up your immune system: Take 1000 mg of Vitamin C with every meal (3 times a day) the day prior to your procedure. Antibiotics: Inform the nursing staff if you are taking any antibiotics or if you have any conditions that may require antibiotics prior to procedures. (Example: recent joint implants)   Pregnancy: If you are pregnant make sure to notify the nursing staff. Not doing so may result in injury to the fetus, including death.  Sickness: If you have a cold, fever, or any active infections, call and cancel or reschedule  your procedure. Receiving steroids while having an infection may result in complications. Arrival: You must be in the facility at least 30 minutes prior to your scheduled procedure. Tardiness: Your scheduled time is also the cutoff time. If you do not arrive at least 15 minutes prior to your procedure, you will be rescheduled.  Children: Do not bring any children with you. Make arrangements to keep them home. Dress appropriately: There is always a possibility that your clothing may get soiled. Avoid long dresses. Valuables: Do not bring any jewelry or valuables.  Reasons to call and reschedule or cancel your procedure: (Following these recommendations will minimize the risk of a serious complication.) Surgeries: Avoid having procedures within 2 weeks of any surgery. (Avoid for 2 weeks before or after any surgery). Flu Shots: Avoid having procedures within 2 weeks of a flu shots or . (Avoid for 2 weeks before or after immunizations). Barium: Avoid having a procedure within 7-10 days after having had a radiological study involving the use of radiological contrast. (Myelograms, Barium swallow or enema study). Heart attacks: Avoid any elective procedures or surgeries for the initial 6 months after a "Myocardial Infarction" (Heart Attack). Blood thinners: It is imperative that you stop these medications before procedures. Let us know if you if you take any blood thinner.  Infection: Avoid procedures during or within two weeks of an infection (including chest colds or gastrointestinal problems). Symptoms associated with infections include: Localized  redness, fever, chills, night sweats or profuse sweating, burning sensation when voiding, cough, congestion, stuffiness, runny nose, sore throat, diarrhea, nausea, vomiting, cold or Flu symptoms, recent or current infections. It is specially important if the infection is over the area that we intend to treat. Heart and lung problems: Symptoms that may suggest an  active cardiopulmonary problem include: cough, chest pain, breathing difficulties or shortness of breath, dizziness, ankle swelling, uncontrolled high or unusually low blood pressure, and/or palpitations. If you are experiencing any of these symptoms, cancel your procedure and contact your primary care physician for an evaluation.  Remember:  Regular Business hours are:  Monday to Thursday 8:00 AM to 4:00 PM  Provider's Schedule: Delano Metz, MD:  Procedure days: Tuesday and Thursday 7:30 AM to 4:00 PM  Edward Jolly, MD:  Procedure days: Monday and Wednesday 7:30 AM to 4:00 PM Last  Updated: 10/25/2023 ______________________________________________________________________      ______________________________________________________________________    General Risks and Possible Complications  Patient Responsibilities: It is important that you read this as it is part of your informed consent. It is our duty to inform you of the risks and possible complications associated with treatments offered to you. It is your responsibility as a patient to read this and to ask questions about anything that is not clear or that you believe was not covered in this document.  Patient's Rights: You have the right to refuse treatment. You also have the right to change your mind, even after initially having agreed to have the treatment done. However, under this last option, if you wait until the last second to change your mind, you may be charged for the materials used up to that point.  Introduction: Medicine is not an Visual merchandiser. Everything in Medicine, including the lack of treatment(s), carries the potential for danger, harm, or loss (which is by definition: Risk). In Medicine, a complication is a secondary problem, condition, or disease that can aggravate an already existing one. All treatments carry the risk of possible complications. The fact that a side effects or complications occurs, does not  imply that the treatment was conducted incorrectly. It must be clearly understood that these can happen even when everything is done following the highest safety standards.  No treatment: You can choose not to proceed with the proposed treatment alternative. The "PRO(s)" would include: avoiding the risk of complications associated with the therapy. The "CON(s)" would include: not getting any of the treatment benefits. These benefits fall under one of three categories: diagnostic; therapeutic; and/or palliative. Diagnostic benefits include: getting information which can ultimately lead to improvement of the disease or symptom(s). Therapeutic benefits are those associated with the successful treatment of the disease. Finally, palliative benefits are those related to the decrease of the primary symptoms, without necessarily curing the condition (example: decreasing the pain from a flare-up of a chronic condition, such as incurable terminal cancer).  General Risks and Complications: These are associated to most interventional treatments. They can occur alone, or in combination. They fall under one of the following six (6) categories: no benefit or worsening of symptoms; bleeding; infection; nerve damage; allergic reactions; and/or death. No benefits or worsening of symptoms: In Medicine there are no guarantees, only probabilities. No healthcare provider can ever guarantee that a medical treatment will work, they can only state the probability that it may. Furthermore, there is always the possibility that the condition may worsen, either directly, or indirectly, as a consequence of the treatment. Bleeding: This is more  common if the patient is taking a blood thinner, either prescription or over the counter (example: Goody Powders, Fish oil, Aspirin, Garlic, etc.), or if suffering a condition associated with impaired coagulation (example: Hemophilia, cirrhosis of the liver, low platelet counts, etc.). However, even  if you do not have one on these, it can still happen. If you have any of these conditions, or take one of these drugs, make sure to notify your treating physician. Infection: This is more common in patients with a compromised immune system, either due to disease (example: diabetes, cancer, human immunodeficiency virus [HIV], etc.), or due to medications or treatments (example: therapies used to treat cancer and rheumatological diseases). However, even if you do not have one on these, it can still happen. If you have any of these conditions, or take one of these drugs, make sure to notify your treating physician. Nerve Damage: This is more common when the treatment is an invasive one, but it can also happen with the use of medications, such as those used in the treatment of cancer. The damage can occur to small secondary nerves, or to large primary ones, such as those in the spinal cord and brain. This damage may be temporary or permanent and it may lead to impairments that can range from temporary numbness to permanent paralysis and/or brain death. Allergic Reactions: Any time a substance or material comes in contact with our body, there is the possibility of an allergic reaction. These can range from a mild skin rash (contact dermatitis) to a severe systemic reaction (anaphylactic reaction), which can result in death. Death: In general, any medical intervention can result in death, most of the time due to an unforeseen complication. ______________________________________________________________________    Capsaicin Topical System What is this medication? CAPSAICIN (cap SAY sin) treats nerve pain. It works by making your skin feel warm or cool, which blocks pain signals going to the brain. This medicine may be used for other purposes; ask your health care provider or pharmacist if you have questions. COMMON BRAND NAME(S): Qutenza What should I tell my care team before I take this medication? They need  to know if you have any of these conditions: Have had a heart attack or stroke High blood pressure Large areas of burned or damaged skin An unusual or allergic reaction to capsaicin, hot peppers, other medications, foods, dyes, or preservatives Pregnant or trying to get pregnant Breastfeeding How should I use this medication? This medication is for external use only. It is applied by your care team in a hospital or clinic setting. Talk to your care team about the use of this medication in children. Special care may be needed. Overdosage: If you think you have taken too much of this medicine contact a poison control center or emergency room at once. NOTE: This medicine is only for you. Do not share this medicine with others. What if I miss a dose? This does not apply. What may interact with this medication? Interactions are not expected. Do not use any other skin products on the affected area without asking your care team. This list may not describe all possible interactions. Give your health care provider a list of all the medicines, herbs, non-prescription drugs, or dietary supplements you use. Also tell them if you smoke, drink alcohol, or use illegal drugs. Some items may interact with your medicine. What should I watch for while using this medication? Your condition will be monitored carefully while you are receiving this medication. Tell your care  team if your symptoms do not start to get better or if they get worse. Talk to your care team about how to treat discomfort. You may place a cooling pack from the refrigerator (not the freezer) on the area. Do not place it directly on the skin. Try not to touch the area where the patch was applied. If you do, wash your hands with soap and water right away. Your skin may be sensitive to heat for a few days after treatment. Avoid hot baths or showers, heating pads, and direct sunlight on the treated area. What side effects may I notice from  receiving this medication? Side effects that you should report to your care team as soon as possible: Allergic reactions--skin rash, itching, hives, swelling of the face, lips, tongue, or throat Burning, itching, crusting, or peeling of treated skin Increase in blood pressure Numbness, decrease in sense of touch or sensation Side effects that usually do not require medical attention (report these to your care team if they continue or are bothersome): Mild skin irritation, redness, or dryness This list may not describe all possible side effects. Call your doctor for medical advice about side effects. You may report side effects to FDA at 1-800-FDA-1088. Where should I keep my medication? This medication is given in a hospital or clinic. It will not be stored at home. NOTE: This sheet is a summary. It may not cover all possible information. If you have questions about this medicine, talk to your doctor, pharmacist, or health care provider.  2024 [employer] Standard (2023-10-14 00:00:00)

## 2024-01-26 ENCOUNTER — Ambulatory Visit: Payer: 59

## 2024-01-31 ENCOUNTER — Ambulatory Visit: Payer: 59

## 2024-02-02 ENCOUNTER — Ambulatory Visit: Payer: 59

## 2024-02-07 ENCOUNTER — Ambulatory Visit: Payer: 59

## 2024-02-07 ENCOUNTER — Telehealth: Payer: Self-pay

## 2024-02-07 NOTE — Telephone Encounter (Signed)
 Called patient about missed PT appointment. No answer but LVM about next follow up appointment.

## 2024-02-09 ENCOUNTER — Ambulatory Visit: Payer: 59

## 2024-02-09 ENCOUNTER — Telehealth: Payer: Self-pay

## 2024-02-09 NOTE — Telephone Encounter (Signed)
 Called pt about missed appointment. Pt wishing to discontinue PT due to changes being made in primary therapist. Encouraged pt to continue but patient is not interested. Educated pt on process to initiate PT again if patient has future interest. Pt understanding.

## 2024-02-14 ENCOUNTER — Ambulatory Visit: Payer: 59

## 2024-02-15 NOTE — Progress Notes (Unsigned)
 PROVIDER NOTE: Interpretation of information contained herein should be left to medically-trained personnel. Specific patient instructions are provided elsewhere under "Patient Instructions" section of medical record. This document was created in part using STT-dictation technology, any transcriptional errors that may result from this process are unintentional.  Patient: Kristie Olson Type: Established DOB: 1956/02/03 MRN: [id number] PCP: [facility]  Service: Procedure DOS: 02/16/2024 Setting: Ambulatory Location: Ambulatory outpatient facility Delivery: Face-to-face Provider: Oswaldo Done, MD Specialty: Interventional Pain Management Specialty designation: 09 Location: Outpatient facility Ref. Prov.: [facility]       Interventional Therapy   Type: Lumbar Facet, Medial Branch Block(s) (w/ fluoroscopic mapping) #1  Laterality: Bilateral  Level: L2, L3, L4, L5, and S1 Medial Branch Level(s). Injecting these levels blocks the L3-4, L4-5, and L5-S1 lumbar facet joints. Imaging: Fluoroscopic guidance Spinal ([id number]) Anesthesia: Local anesthesia (1-2% Lidocaine) Anxiolysis: IV Versed 3.0 mg Sedation: Moderate Sedation None required. No Fentanyl administered.         DOS: 02/16/2024 Performed by: Oswaldo Done, MD  Primary Purpose: Diagnostic/Therapeutic Indications: Low back pain severe enough to impact quality of life or function. 1. Chronic low back pain (1ry area of Pain) (Bilateral) w/o sciatica   2. Lumbar facet joint pain   3. Lumbar facet arthropathy   4. Lumbar facet hypertrophy (Multilevel) (Bilateral)   5. Osteoarthritis of facet joint of lumbar spine   6. Spondylosis without myelopathy or radiculopathy, lumbosacral region   7. Chronic anticoagulation (Plavix)    NAS-11 Pain score:   Pre-procedure: 7 /10   Post-procedure: 0-No pain/10     Position / Prep / Materials:  Position: Prone  Prep solution: ChloraPrep (2%  chlorhexidine gluconate and 70% isopropyl alcohol) Area Prepped: Posterolateral Lumbosacral Spine (Wide prep: From the lower border of the scapula down to the end of the tailbone and from flank to flank.)  Materials:  Tray: Block Needle(s):  Type: Spinal  Gauge (G): 22  Length: 5-in Qty: 4      H&P (Pre-op Assessment):  Kristie Olson is a 68 y.o. (year old), female patient, seen today for interventional treatment. She  has a past surgical history that includes Cesarean section; Breast mass excision (Left); Cholecystectomy (2009); Breast excisional biopsy (Left, 1980's); Lower Extremity Angiography (Left, 09/03/2022); Lower Extremity Angiography (Right, 09/08/2022); Lower Extremity Angiography (Left, 01/18/2023); Lower Extremity Angiography (Left, 02/14/2023); and Lower Extremity Angiography (Left, 06/27/2023). Kristie Olson has a current medication list which includes the following prescription(s): acetaminophen, advair diskus, albuterol, amitriptyline, aspirin ec, aspirin-salicylamide-caffeine, vitamin d3, clopidogrel, freestyle libre 2 sensor, ergocalciferol, fluoxetine, gabapentin, glipizide, insulin nph-regular human, lisinopril-hydrochlorothiazide, tradjenta, and atorvastatin, and the following Facility-Administered Medications: fentanyl. Her primarily concern today is the Back Pain (lower)  Initial Vital Signs:  Pulse/HCG Rate: 82ECG Heart Rate: 96 (nsr) Temp: 99.1 F (37.3 C) Resp: 14 BP: 135/62 SpO2: 97 %  BMI: Estimated body mass index is 41.01 kg/m as calculated from the following:   Height as of this encounter: 5' (1.524 m).   Weight as of this encounter: 210 lb (95.3 kg).  Risk Assessment: Allergies: Reviewed. She is allergic to ibuprofen, nsaids, and percocet [oxycodone-acetaminophen].  Allergy Precautions: None required Coagulopathies: Reviewed. None identified.  Blood-thinner therapy: None at this time Active Infection(s): Reviewed. None identified. Kristie Olson is afebrile  Site  Confirmation: Kristie Olson was asked to confirm the procedure and laterality before marking the site Procedure checklist: Completed Consent: Before the procedure and under the influence of no sedative(s), amnesic(s), or anxiolytics, the patient  was informed of the treatment options, risks and possible complications. To fulfill our ethical and legal obligations, as recommended by the American Medical Association's Code of Ethics, I have informed the patient of my clinical impression; the nature and purpose of the treatment or procedure; the risks, benefits, and possible complications of the intervention; the alternatives, including doing nothing; the risk(s) and benefit(s) of the alternative treatment(s) or procedure(s); and the risk(s) and benefit(s) of doing nothing. The patient was provided information about the general risks and possible complications associated with the procedure. These may include, but are not limited to: failure to achieve desired goals, infection, bleeding, organ or nerve damage, allergic reactions, paralysis, and death. In addition, the patient was informed of those risks and complications associated to Spine-related procedures, such as failure to decrease pain; infection (i.e.: Meningitis, epidural or intraspinal abscess); bleeding (i.e.: epidural hematoma, subarachnoid hemorrhage, or any other type of intraspinal or peri-dural bleeding); organ or nerve damage (i.e.: Any type of peripheral nerve, nerve root, or spinal cord injury) with subsequent damage to sensory, motor, and/or autonomic systems, resulting in permanent pain, numbness, and/or weakness of one or several areas of the body; allergic reactions; (i.e.: anaphylactic reaction); and/or death. Furthermore, the patient was informed of those risks and complications associated with the medications. These include, but are not limited to: allergic reactions (i.e.: anaphylactic or anaphylactoid reaction(s)); adrenal axis suppression;  blood sugar elevation that in diabetics may result in ketoacidosis or comma; water retention that in patients with history of congestive heart failure may result in shortness of breath, pulmonary edema, and decompensation with resultant heart failure; weight gain; swelling or edema; medication-induced neural toxicity; particulate matter embolism and blood vessel occlusion with resultant organ, and/or nervous system infarction; and/or aseptic necrosis of one or more joints. Finally, the patient was informed that Medicine is not an exact science; therefore, there is also the possibility of unforeseen or unpredictable risks and/or possible complications that may result in a catastrophic outcome. The patient indicated having understood very clearly. We have given the patient no guarantees and we have made no promises. Enough time was given to the patient to ask questions, all of which were answered to the patient's satisfaction. Ms. Robbs has indicated that she wanted to continue with the procedure. Attestation: I, the ordering provider, attest that I have discussed with the patient the benefits, risks, side-effects, alternatives, likelihood of achieving goals, and potential problems during recovery for the procedure that I have provided informed consent. Date  Time: 02/16/2024  9:35 AM  Pre-Procedure Preparation:  Monitoring: As per clinic protocol. Respiration, ETCO2, SpO2, BP, heart rate and rhythm monitor placed and checked for adequate function Safety Precautions: Patient was assessed for positional comfort and pressure points before starting the procedure. Time-out: I initiated and conducted the "Time-out" before starting the procedure, as per protocol. The patient was asked to participate by confirming the accuracy of the "Time Out" information. Verification of the correct person, site, and procedure were performed and confirmed by me, the nursing staff, and the patient. "Time-out" conducted as per Joint  Commission's Universal Protocol (UP.01.01.01). Time: 1028 Start Time: 1028 hrs.  Description of Procedure:          Laterality: (see above) Targeted Levels: (see above)  Safety Precautions: Aspiration looking for blood return was conducted prior to all injections. At no point did we inject any substances, as a needle was being advanced. Before injecting, the patient was told to immediately notify me if she was experiencing any  new onset of "ringing in the ears, or metallic taste in the mouth". No attempts were made at seeking any paresthesias. Safe injection practices and needle disposal techniques used. Medications properly checked for expiration dates. SDV (single dose vial) medications used. After the completion of the procedure, all disposable equipment used was discarded in the proper designated medical waste containers. Local Anesthesia: Protocol guidelines were followed. The patient was positioned over the fluoroscopy table. The area was prepped in the usual manner. The time-out was completed. The target area was identified using fluoroscopy. A 12-in long, straight, sterile hemostat was used with fluoroscopic guidance to locate the targets for each level blocked. Once located, the skin was marked with an approved surgical skin marker. Once all sites were marked, the skin (epidermis, dermis, and hypodermis), as well as deeper tissues (fat, connective tissue and muscle) were infiltrated with a small amount of a short-acting local anesthetic, loaded on a 10cc syringe with a 25G, 1.5-in  Needle. An appropriate amount of time was allowed for local anesthetics to take effect before proceeding to the next step. Local Anesthetic: Lidocaine 2.0% The unused portion of the local anesthetic was discarded in the proper designated containers. Technical description of process:  L2 Medial Branch Nerve Block (MBB): The target area for the L2 medial branch is at the junction of the postero-lateral aspect of the  superior articular process and the superior, posterior, and medial edge of the transverse process of L3. Under fluoroscopic guidance, a Quincke needle was inserted until contact was made with os over the superior postero-lateral aspect of the pedicular shadow (target area). After negative aspiration for blood, 0.5 mL of the nerve block solution was injected without difficulty or complication. The needle was removed intact. L3 Medial Branch Nerve Block (MBB): The target area for the L3 medial branch is at the junction of the postero-lateral aspect of the superior articular process and the superior, posterior, and medial edge of the transverse process of L4. Under fluoroscopic guidance, a Quincke needle was inserted until contact was made with os over the superior postero-lateral aspect of the pedicular shadow (target area). After negative aspiration for blood, 0.5 mL of the nerve block solution was injected without difficulty or complication. The needle was removed intact. L4 Medial Branch Nerve Block (MBB): The target area for the L4 medial branch is at the junction of the postero-lateral aspect of the superior articular process and the superior, posterior, and medial edge of the transverse process of L5. Under fluoroscopic guidance, a Quincke needle was inserted until contact was made with os over the superior postero-lateral aspect of the pedicular shadow (target area). After negative aspiration for blood, 0.5 mL of the nerve block solution was injected without difficulty or complication. The needle was removed intact. L5 Medial Branch Nerve Block (MBB): The target area for the L5 medial branch is at the junction of the postero-lateral aspect of the superior articular process and the superior, posterior, and medial edge of the sacral ala. Under fluoroscopic guidance, a Quincke needle was inserted until contact was made with os over the superior postero-lateral aspect of the pedicular shadow (target area). After  negative aspiration for blood, 0.5 mL of the nerve block solution was injected without difficulty or complication. The needle was removed intact. S1 Medial Branch Nerve Block (MBB): The target area for the S1 medial branch is at the posterior and inferior 6 o'clock position of the L5-S1 facet joint. Under fluoroscopic guidance, the Quincke needle inserted for the L5 MBB  was redirected until contact was made with os over the inferior and postero aspect of the sacrum, at the 6 o' clock position under the L5-S1 facet joint (Target area). After negative aspiration for blood, 0.5 mL of the nerve block solution was injected without difficulty or complication. The needle was removed intact.  Once the entire procedure was completed, the treated area was cleaned, making sure to leave some of the prepping solution back to take advantage of its long term bactericidal properties.         Illustration of the posterior view of the lumbar spine and the posterior neural structures. Laminae of L2 through S1 are labeled. DPRL5, dorsal primary ramus of L5; DPRS1, dorsal primary ramus of S1; DPR3, dorsal primary ramus of L3; FJ, facet (zygapophyseal) joint L3-L4; I, inferior articular process of L4; LB1, lateral branch of dorsal primary ramus of L1; IAB, inferior articular branches from L3 medial branch (supplies L4-L5 facet joint); IBP, intermediate branch plexus; MB3, medial branch of dorsal primary ramus of L3; NR3, third lumbar nerve root; S, superior articular process of L5; SAB, superior articular branches from L4 (supplies L4-5 facet joint also); TP3, transverse process of L3.   Facet Joint Innervation (* possible contribution)  L1-2 T12, L1 (L2*)  Medial Branch  L2-3 L1, L2 (L3*)         "          "  L3-4 L2, L3 (L4*)         "          "  L4-5 L3, L4 (L5*)         "          "  L5-S1 L4, L5, S1          "          "    Vitals:   02/16/24 1035 02/16/24 1040 02/16/24 1050 02/16/24 1100  BP: 124/73 (!)  142/81 129/75 (!) 152/86  Pulse:      Resp: 15 17 16 16   Temp:      TempSrc:      SpO2: 100% 100% 99% 100%  Weight:      Height:         End Time: 1038 hrs.  Imaging Guidance (Spinal):          Type of Imaging Technique: Fluoroscopy Guidance (Spinal) Indication(s): Fluoroscopy guidance for needle placement to enhance accuracy in procedures requiring precise needle localization for targeted delivery of medication in or near specific anatomical locations not easily accessible without such real-time imaging assistance. Exposure Time: Please see nurses notes. Contrast: None used. Fluoroscopic Guidance: I was personally present during the use of fluoroscopy. "Tunnel Vision Technique" used to obtain the best possible view of the target area. Parallax error corrected before commencing the procedure. "Direction-depth-direction" technique used to introduce the needle under continuous pulsed fluoroscopy. Once target was reached, antero-posterior, oblique, and lateral fluoroscopic projection used confirm needle placement in all planes. Images permanently stored in EMR. Interpretation: No contrast injected. I personally interpreted the imaging intraoperatively. Adequate needle placement confirmed in multiple planes. Permanent images saved into the patient's record.  Post-operative Assessment:  Post-procedure Vital Signs:  Pulse/HCG Rate: 82100 Temp: 99.1 F (37.3 C) Resp: 16 BP: (!) 152/86 SpO2: 100 %  EBL: None  Complications: No immediate post-treatment complications observed by team, or reported by patient.  Note: The patient tolerated the entire procedure well. A repeat set of vitals were taken after the procedure  and the patient was kept under observation following institutional policy, for this type of procedure. Post-procedural neurological assessment was performed, showing return to baseline, prior to discharge. The patient was provided with post-procedure discharge instructions,  including a section on how to identify potential problems. Should any problems arise concerning this procedure, the patient was given instructions to immediately contact us, at any time, without hesitation. In any case, we plan to contact the patient by telephone for a follow-up status report regarding this interventional procedure.  Comments:  No additional relevant information.  Plan of Care (POC)  Orders:  Orders Placed This Encounter  Procedures   LUMBAR FACET(MEDIAL BRANCH NERVE BLOCK) MBNB    Scheduling Instructions:     Procedure: Lumbar facet block (AKA.: Lumbosacral medial branch nerve block)     Side: Bilateral     Level: L3-4, L4-5, L5-S1, and TBD Facets (L2, L3, L4, L5, S1, and TBD Medial Branch Nerves)     Sedation: Patient's choice.     Timeframe: Today    Where will this procedure be performed?:   [facility]   DG [facility] C-ARM 1-60 MIN NO REPORT    Intraoperative interpretation by procedural physician at [facility].    Standing Status:   Standing    Number of Occurrences:   1    Reason for exam::   Assistance in needle guidance and placement for procedures requiring needle placement in or near specific anatomical locations not easily accessible without such assistance.   Informed Consent Details: Physician/Practitioner Attestation; Transcribe to consent form and obtain patient signature    Nursing Order: Transcribe to consent form and obtain patient signature. Note: Always confirm laterality of pain with Ms. Rutan, before procedure.    Physician/Practitioner attestation of informed consent for procedure/surgical case:   I, the physician/practitioner, attest that I have discussed with the patient the benefits, risks, side effects, alternatives, likelihood of achieving goals and potential problems during recovery for the procedure that I have provided informed consent.    Procedure:   Lumbar Facet Block  under fluoroscopic guidance     Physician/Practitioner performing the procedure:   Jonanthony Nahar A. Laban Emperor MD    Indication/Reason:   Low Back Pain, with our without leg pain, due to Facet Joint Arthralgia (Joint Pain) Spondylosis (Arthritis of the Spine), without myelopathy or radiculopathy (Nerve Damage).   Care order/instruction: Please confirm that the patient has stopped the Plavix (Clopidogrel) x 7-10 days prior to procedure or surgery.    Please confirm that the patient has stopped the Plavix (Clopidogrel) x 7-10 days prior to procedure or surgery.    Standing Status:   Standing    Number of Occurrences:   1   Provide equipment / supplies at bedside    Procedure tray: "Block Tray" (Disposable  single use) Skin infiltration needle: Regular 1.5-in, 25-G, (x1) Block Needle type: Spinal Amount/quantity: 4 Size: Medium (5-inch) Gauge: 22G    Standing Status:   Standing    Number of Occurrences:   1    Specify:   Block Tray   Saline lock IV    Have LR [phone] mL available and administer at 125 mL/hr if patient becomes hypotensive.    Standing Status:   Standing    Number of Occurrences:   1   Bleeding precautions    Standing Status:   Standing    Number of Occurrences:   1   Chronic Opioid Analgesic:   No chronic opioid analgesics therapy prescribed by  our practice. None MME/day: 0 mg/day   Medications ordered for procedure: Meds ordered this encounter  Medications   lidocaine (XYLOCAINE) 2 % (with pres) injection 400 mg   pentafluoroprop-tetrafluoroeth (GEBAUERS) aerosol   midazolam (VERSED) 5 MG/5ML injection 0.5-2 mg    Make sure Flumazenil is available in the pyxis when using this medication. If oversedation occurs, administer 0.2 mg IV over 15 sec. If after 45 sec no response, administer 0.2 mg again over 1 min; may repeat at 1 min intervals; not to exceed 4 doses (1 mg)   fentaNYL (SUBLIMAZE) injection 25-50 mcg    Make sure Narcan is available in the pyxis when using this medication. In the event of  respiratory depression (RR< 8/min): Titrate NARCAN (naloxone) in increments of 0.1 to 0.2 mg IV at 2-3 minute intervals, until desired degree of reversal.   ropivacaine (PF) 2 mg/mL (0.2%) (NAROPIN) injection 18 mL   triamcinolone acetonide (KENALOG-40) injection 80 mg   Medications administered: We administered lidocaine, pentafluoroprop-tetrafluoroeth, midazolam, ropivacaine (PF) 2 mg/mL (0.2%), and triamcinolone acetonide.  See the medical record for exact dosing, route, and time of administration.  Follow-up plan:   Return in about 2 weeks (around 03/01/2024) for (Face2F), (PPE).       Interventional Therapies  Risk Factors  Considerations  Medical Comorbidities:  Plavix Anticoagulation: (Stop: 7-10 days  Restart: 2 hours)  CAD  T2IDDM  BA  HTN  RBBB  Anxiety/Depression  FM  MO (BMI>40)          Planned  Pending:   Diagnostic bilateral lumbar facet MBB #1  Therapeutic bilateral Qutenza treatment #1    Under consideration:   Diagnostic bilateral lumbar facet MBB #1  Therapeutic bilateral Qutenza treatment #1    Completed:   None at this time   Therapeutic  Palliative (PRN) options:   None established   Completed by other providers:   Diagnostic bilateral L3-L5 lumbar facet MBB x1 (04/07/2023) by Filomena Jungling, MD [facility] PMR)  Diagnostic bilateral L5-S1 TFESI x2 (12/30/2022, 03/04/2023) by Filomena Jungling, MD [facility] PMR)  Referral to neurology for lower extremity EMG/PNCV (diabetic peripheral neuropathy vs ischemic neuropathy) (study apparently done by Dr. Theora Master)  (01/09/2024) [facility] neurology)      Recent Visits Date Type Provider Dept  01/25/24 Office Visit Delano Metz, MD [facility]  12/12/23 Office Visit Delano Metz, MD [facility]  Showing recent visits within past 90 days and meeting all other requirements Today's Visits Date Type Provider Dept  02/16/24 Procedure visit Delano Metz, MD [facility]  Showing today's visits and meeting all other requirements Future Appointments Date Type Provider Dept  03/01/24 Appointment Delano Metz, MD [facility]  Showing future appointments within next 90 days and meeting all other requirements  Disposition: Discharge home  Discharge (Date  Time): 02/16/2024; 1109 hrs.   Primary Care Physician: [facility] Location: [facility] Note by: Oswaldo Done, MD (TTS technology used. I apologize for any typographical errors that were not detected and corrected.) Date: 02/16/2024; Time: 11:15 AM  Disclaimer:  Medicine is not an Visual merchandiser. The only guarantee in medicine is that nothing is guaranteed. It is important to note that the decision to proceed with this intervention was based on the information collected from the patient. The Data and conclusions were drawn from the patient's questionnaire, the interview, and the physical examination. Because the information was provided in large part by the patient, it cannot  be guaranteed that it has not been purposely or unconsciously manipulated. Every effort has been made to obtain as much relevant data as possible for this evaluation. It is important to note that the conclusions that lead to this procedure are derived in large part from the available data. Always take into account that the treatment will also be dependent on availability of resources and existing treatment guidelines, considered by other Pain Management Practitioners as being common knowledge and practice, at the time of the intervention. For Medico-Legal purposes, it is also important to point out that variation in procedural techniques and pharmacological choices are the acceptable norm. The indications, contraindications, technique, and results of the above procedure should only be interpreted and judged by a Board-Certified Interventional Pain Specialist with extensive  familiarity and expertise in the same exact procedure and technique.

## 2024-02-16 ENCOUNTER — Encounter: Payer: Self-pay | Admitting: Pain Medicine

## 2024-02-16 ENCOUNTER — Ambulatory Visit: Attending: Pain Medicine | Admitting: Pain Medicine

## 2024-02-16 ENCOUNTER — Ambulatory Visit: Payer: 59

## 2024-02-16 ENCOUNTER — Ambulatory Visit
Admission: RE | Admit: 2024-02-16 | Discharge: 2024-02-16 | Disposition: A | Discharge status: Home / Self Care | Source: Ambulatory Visit | Attending: Pain Medicine | Admitting: Pain Medicine

## 2024-02-16 VITALS — BP 152/86 | HR 82 | Temp 99.1°F | Resp 16 | Ht 60.0 in | Wt 210.0 lb

## 2024-02-16 DIAGNOSIS — M5459 Other low back pain: Secondary | ICD-10-CM | POA: Diagnosis present

## 2024-02-16 DIAGNOSIS — M549 Dorsalgia, unspecified: Secondary | ICD-10-CM

## 2024-02-16 DIAGNOSIS — M47816 Spondylosis without myelopathy or radiculopathy, lumbar region: Secondary | ICD-10-CM | POA: Diagnosis not present

## 2024-02-16 DIAGNOSIS — M545 Low back pain, unspecified: Secondary | ICD-10-CM | POA: Insufficient documentation

## 2024-02-16 DIAGNOSIS — G8929 Other chronic pain: Secondary | ICD-10-CM | POA: Diagnosis not present

## 2024-02-16 DIAGNOSIS — M47817 Spondylosis without myelopathy or radiculopathy, lumbosacral region: Secondary | ICD-10-CM | POA: Diagnosis present

## 2024-02-16 DIAGNOSIS — Z7901 Long term (current) use of anticoagulants: Secondary | ICD-10-CM

## 2024-02-16 MED ORDER — FENTANYL CITRATE (PF) 100 MCG/2ML IJ SOLN: 25.0000 ug | INTRAMUSCULAR | Status: DC | PRN

## 2024-02-16 MED ORDER — MIDAZOLAM HCL 5 MG/5ML IJ SOLN
INTRAMUSCULAR | Status: AC
  Filled 2024-02-16: qty 5

## 2024-02-16 MED ORDER — PENTAFLUOROPROP-TETRAFLUOROETH EX AERO
INHALATION_SPRAY | Freq: Once | CUTANEOUS | Status: AC
  Administered 2024-02-16: 30 via TOPICAL

## 2024-02-16 MED ORDER — MIDAZOLAM HCL 5 MG/5ML IJ SOLN
0.5000 mg | Freq: Once | INTRAMUSCULAR | Status: AC
Start: 2024-02-16 — End: 2024-02-16
  Administered 2024-02-16: 3 mg via INTRAVENOUS

## 2024-02-16 MED ORDER — LIDOCAINE HCL 2 % IJ SOLN
20.0000 mL | Freq: Once | INTRAMUSCULAR | Status: AC
Start: 2024-02-16 — End: 2024-02-16
  Administered 2024-02-16: 400 mg

## 2024-02-16 MED ORDER — ROPIVACAINE HCL 2 MG/ML IJ SOLN
18.0000 mL | Freq: Once | INTRAMUSCULAR | Status: AC
  Administered 2024-02-16: 18 mL via PERINEURAL

## 2024-02-16 MED ORDER — ROPIVACAINE HCL 2 MG/ML IJ SOLN
INTRAMUSCULAR | Status: AC
  Filled 2024-02-16: qty 20

## 2024-02-16 MED ORDER — FENTANYL CITRATE (PF) 100 MCG/2ML IJ SOLN
INTRAMUSCULAR | Status: AC
Start: 2024-02-16 — End: ?
  Filled 2024-02-16: qty 2

## 2024-02-16 MED ORDER — TRIAMCINOLONE ACETONIDE 40 MG/ML IJ SUSP
INTRAMUSCULAR | Status: AC
  Filled 2024-02-16: qty 2

## 2024-02-16 MED ORDER — LIDOCAINE HCL 2 % IJ SOLN
INTRAMUSCULAR | Status: AC
  Filled 2024-02-16: qty 20

## 2024-02-16 MED ORDER — TRIAMCINOLONE ACETONIDE 40 MG/ML IJ SUSP
80.0000 mg | Freq: Once | INTRAMUSCULAR | Status: AC
Start: 2024-02-16 — End: 2024-02-16
  Administered 2024-02-16: 80 mg

## 2024-02-16 NOTE — Patient Instructions (Addendum)
 Moderate Conscious Sedation, Adult Sedation is the use of medicines to help you relax and not feel pain. Moderate conscious sedation is a type of sedation that makes you less alert than normal. You are still able to respond to instructions, touch, or both. This type of sedation is used during short medical and dental procedures. It is milder than deep sedation, which is a type of sedation you cannot be easily woken up from. It is also milder than general anesthesia, which is the use of medicines to make you fall asleep. Moderate conscious sedation lets you return to your normal activities sooner. Tell a health care provider about: Any allergies you have. All medicines you are taking, including vitamins, herbs, steroids, eye drops, creams, and over-the-counter medicines. Any problems you or family members have had with anesthesia. Any bleeding problems you have. Any surgeries you have had. Any medical conditions you have. Whether you are pregnant or may be pregnant. Any recent alcohol, tobacco, or drug use. What are the risks? Your health care provider will talk with you about risks. These may include: Oversedation. This is when you get too much medicine. Nausea or vomiting. Allergic reaction to medicines. Trouble breathing. If this happens, a breathing tube may be used. It will be removed when you can breathe better on your own. Heart trouble. Lung trouble. Emergence delirium. This is when you feel confused while the sedation wears off. This gets better with time. What happens before the procedure? When to stop eating and drinking Follow instructions from your health care provider about what you may eat and drink. These may include: 8 hours before your procedure Stop eating most foods. Do not eat meat, fried foods, or fatty foods. Eat only light foods, such as toast or crackers. All liquids are okay except energy drinks and alcohol. 6 hours before your procedure Stop eating. Drink only  clear liquids, such as water, clear fruit juice, black coffee, plain tea, and sports drinks. Do not drink energy drinks or alcohol. 2 hours before your procedure Stop drinking all liquids. You may be allowed to take medicines with small sips of water. If you do not follow your health care provider's instructions, your procedure may be delayed or canceled. Medicines Ask your health care provider about: Changing or stopping your regular medicines. These include any diabetes medicines or blood thinners you take. Taking medicines such as aspirin and ibuprofen. These medicines can thin your blood. Do not take them unless your health care provider tells you to. Taking over-the-counter medicines, vitamins, herbs, and supplements. Tests and exams You may have an exam or testing. You may have a blood or urine sample taken. General instructions Do not use any products that contain nicotine or tobacco for at least 4 weeks before the procedure. These products include cigarettes, chewing tobacco, and vaping devices, such as e-cigarettes. If you need help quitting, ask your health care provider. If you will be going home right after the procedure, plan to have a responsible adult: Take you home from the hospital or clinic. You will not be allowed to drive. Care for you for the time you are told. What happens during the procedure?  You will be given the sedative. It may be given: As a pill you can take by mouth. It can also be put into the rectum. As a spray through the nose. As an injection into muscle. As an injection into a vein through an IV. You may be given oxygen as needed. Your blood pressure, heart  rate, breathing rate, and blood oxygen level will be monitored during the procedure. The medical or dental procedure will be done. The procedure may vary among health care providers and hospitals. What happens after the procedure? Your blood pressure, heart rate, breathing rate, and blood oxygen  level will be monitored until you leave the hospital or clinic. You will get fluids through an IV as needed. Do not drive or operate machinery until your health care provider says that it is safe. This information is not intended to replace advice given to you by your health care provider. Make sure you discuss any questions you have with your health care provider. Document Revised: 05/17/2022 Document Reviewed: 05/17/2022 [employer] Patient Education  2024 [employer] ______________________________________________________________________    Post-Procedure Discharge Instructions  Instructions: Apply ice:  Purpose: This will minimize any swelling and discomfort after procedure.  When: Day of procedure, as soon as you get home. How: Fill a plastic sandwich bag with crushed ice. Cover it with a small towel and apply to injection site. How long: (15 min on, 15 min off) Apply for 15 minutes then remove x 15 minutes.  Repeat sequence on day of procedure, until you go to bed. Apply heat:  Purpose: To treat any soreness and discomfort from the procedure. When: Starting the next day after the procedure. How: Apply heat to procedure site starting the day following the procedure. How long: May continue to repeat daily, until discomfort goes away. Food intake: Start with clear liquids (like water) and advance to regular food, as tolerated.  Physical activities: Keep activities to a minimum for the first 8 hours after the procedure. After that, then as tolerated. Driving: If you have received any sedation, be responsible and do not drive. You are not allowed to drive for 24 hours after having sedation. Blood thinner: (Applies only to those taking blood thinners) You may restart your blood thinner 6 hours after your procedure. Insulin: (Applies only to Diabetic patients taking insulin) As soon as you can eat, you may resume your normal dosing schedule. Infection prevention: Keep procedure site clean and  dry. Shower daily and clean area with soap and water. Post-procedure Pain Diary: Extremely important that this be done correctly and accurately. Recorded information will be used to determine the next step in treatment. For the purpose of accuracy, follow these rules: Evaluate only the area treated. Do not report or include pain from an untreated area. For the purpose of this evaluation, ignore all other areas of pain, except for the treated area. After your procedure, avoid taking a long nap and attempting to complete the pain diary after you wake up. Instead, set your alarm clock to go off every hour, on the hour, for the initial 8 hours after the procedure. Document the duration of the numbing medicine, and the relief you are getting from it. Do not go to sleep and attempt to complete it later. It will not be accurate. If you received sedation, it is likely that you were given a medication that may cause amnesia. Because of this, completing the diary at a later time may cause the information to be inaccurate. This information is needed to plan your care. Follow-up appointment: Keep your post-procedure follow-up evaluation appointment after the procedure (usually 2 weeks for most procedures, 6 weeks for radiofrequencies). DO NOT FORGET to bring you pain diary with you.   Expect: (What should I expect to see with my procedure?) From numbing medicine (AKA: Local Anesthetics): Numbness or decrease  in pain. You may also experience some weakness, which if present, could last for the duration of the local anesthetic. Onset: Full effect within 15 minutes of injected. Duration: It will depend on the type of local anesthetic used. On the average, 1 to 8 hours.  From steroids (Applies only if steroids were used): Decrease in swelling or inflammation. Once inflammation is improved, relief of the pain will follow. Onset of benefits: Depends on the amount of swelling present. The more swelling, the longer it will  take for the benefits to be seen. In some cases, up to 10 days. Duration: Steroids will stay in the system x 2 weeks. Duration of benefits will depend on multiple posibilities including persistent irritating factors. Side-effects: If present, they may typically last 2 weeks (the duration of the steroids). Frequent: Cramps (if they occur, drink Gatorade and take over-the-counter Magnesium 450-500 mg once to twice a day); water retention with temporary weight gain; increases in blood sugar; decreased immune system response; increased appetite. Occasional: Facial flushing (red, warm cheeks); mood swings; menstrual changes. Uncommon: Long-term decrease or suppression of natural hormones; bone thinning. (These are more common with higher doses or more frequent use. This is why we prefer that our patients avoid having any injection therapies in other practices.)  Very Rare: Severe mood changes; psychosis; aseptic necrosis. From procedure: Some discomfort is to be expected once the numbing medicine wears off. This should be minimal if ice and heat are applied as instructed.  Call if: (When should I call?) You experience numbness and weakness that gets worse with time, as opposed to wearing off. New onset bowel or bladder incontinence. (Applies only to procedures done in the spine)  Emergency Numbers: Durning business hours (Monday - Thursday, 8:00 AM - 4:00 PM) (Friday, 9:00 AM - 12:00 Noon): [phone] After hours: [phone] NOTE: If you are having a problem and are unable connect with, or to talk to a provider, then go to your nearest urgent care or emergency department. If the problem is serious and urgent, please call 911. ______________________________________________________________________      ______________________________________________________________________    Blood Thinners  IMPORTANT NOTICE:  If you take any of these, make sure to notify the nursing staff.  Failure to do  so may result in serious injury.  Recommended time intervals to stop and restart blood-thinners, before & after invasive procedures  Generic Name Brand Name Pre-procedure: Stop medication for this amount of time before your procedure: Post-procedure: Wait this amount of time after the procedure before restarting your medication:  Abciximab Reopro 15 days 2 hrs  Alteplase Activase 10 days 10 days  Anagrelide Agrylin    Apixaban Eliquis 3 days 6 hrs  Cilostazol Pletal 3 days 5 hrs  Clopidogrel Plavix 7-10 days 2 hrs  Dabigatran Pradaxa 5 days 6 hrs  Dalteparin Fragmin 24 hours 4 hrs  Dipyridamole Aggrenox 11days 2 hrs  Edoxaban Lixiana; Savaysa 3 days 2 hrs  Enoxaparin  Lovenox 24 hours 4 hrs  Eptifibatide Integrillin 8 hours 2 hrs  Fondaparinux  Arixtra 72 hours 12 hrs  Hydroxychloroquine Plaquenil 11 days   Prasugrel Effient 7-10 days 6 hrs  Reteplase Retavase 10 days 10 days  Rivaroxaban Xarelto 3 days 6 hrs  Ticagrelor Brilinta 5-7 days 6 hrs  Ticlopidine Ticlid 10-14 days 2 hrs  Tinzaparin Innohep 24 hours 4 hrs  Tirofiban Aggrastat 8 hours 2 hrs  Warfarin Coumadin 5 days 2 hrs   Other medications with blood-thinning effects  NOTE: Consider  stopping these if you have prolonged bleeding despite not taking any of the above blood thinners. Otherwise ask your provider and this will be decided on a case-by-case basis.  Product indications Generic (Brand) names Note  Cholesterol Lipitor Stop 4 days before procedure  Blood thinner (injectable) Heparin (LMW or LMWH Heparin) Stop 24 hours before procedure  Cancer Ibrutinib (Imbruvica) Stop 7 days before procedure  Malaria/Rheumatoid Hydroxychloroquine (Plaquenil) Stop 11 days before procedure  Thrombolytics  10 days before or after procedures   Over-the-counter (OTC) Products with blood-thinning effects  NOTE: Consider stopping these if you have prolonged bleeding despite not taking any of the above blood thinners. Otherwise ask your  provider and this will be decided on a case-by-case basis.  Product Common names Stop Time  Aspirin > 325 mg Goody Powders, Excedrin, etc. 11 days  Aspirin <= 81 mg  7 days  Fish oil  4 days  Garlic supplements  7 days  Ginkgo biloba  36 hours  Ginseng  24 hours  NSAIDs Ibuprofen, Naprosyn, etc. 3 days  Vitamin E  4 days   ______________________________________________________________________

## 2024-02-17 ENCOUNTER — Telehealth: Payer: Self-pay

## 2024-02-17 NOTE — Telephone Encounter (Signed)
 Attempt to contact patient for post-procedure f/u. No answer and LVM.

## 2024-03-01 ENCOUNTER — Ambulatory Visit: Admitting: Pain Medicine

## 2024-03-15 ENCOUNTER — Encounter: Payer: Self-pay | Admitting: Pain Medicine

## 2024-03-15 ENCOUNTER — Ambulatory Visit: Attending: Pain Medicine | Admitting: Pain Medicine

## 2024-03-15 VITALS — BP 138/73 | HR 76 | Temp 97.2°F | Resp 16 | Ht 63.0 in | Wt 207.0 lb

## 2024-03-15 DIAGNOSIS — M545 Low back pain, unspecified: Secondary | ICD-10-CM | POA: Diagnosis present

## 2024-03-15 DIAGNOSIS — M47816 Spondylosis without myelopathy or radiculopathy, lumbar region: Secondary | ICD-10-CM | POA: Diagnosis present

## 2024-03-15 DIAGNOSIS — Z09 Encounter for follow-up examination after completed treatment for conditions other than malignant neoplasm: Secondary | ICD-10-CM

## 2024-03-15 DIAGNOSIS — M5459 Other low back pain: Secondary | ICD-10-CM | POA: Diagnosis present

## 2024-03-15 DIAGNOSIS — M25552 Pain in left hip: Secondary | ICD-10-CM | POA: Diagnosis present

## 2024-03-15 DIAGNOSIS — M719 Bursopathy, unspecified: Secondary | ICD-10-CM | POA: Insufficient documentation

## 2024-03-15 DIAGNOSIS — Z7901 Long term (current) use of anticoagulants: Secondary | ICD-10-CM | POA: Diagnosis present

## 2024-03-15 DIAGNOSIS — G8929 Other chronic pain: Secondary | ICD-10-CM

## 2024-03-15 DIAGNOSIS — M79672 Pain in left foot: Secondary | ICD-10-CM | POA: Insufficient documentation

## 2024-03-15 DIAGNOSIS — M25551 Pain in right hip: Secondary | ICD-10-CM | POA: Insufficient documentation

## 2024-03-15 DIAGNOSIS — M79671 Pain in right foot: Secondary | ICD-10-CM | POA: Diagnosis present

## 2024-03-15 DIAGNOSIS — M47817 Spondylosis without myelopathy or radiculopathy, lumbosacral region: Secondary | ICD-10-CM | POA: Diagnosis present

## 2024-03-15 NOTE — Patient Instructions (Addendum)
 ______________________________________________________________________    Procedure instructions  Stop blood-thinners  Do not eat or drink fluids (other than water) for 6 hours before your procedure  No water for 2 hours before your procedure  Take your blood pressure medicine with a sip of water  Arrive 30 minutes before your appointment  If sedation is planned, bring suitable driver. Teddie Favre, [employer], & public transportation are NOT APPROVED)  Carefully read the "Preparing for your procedure" detailed instructions  If you have questions call us  at [phone]  Procedure appointments are for procedures only. NO medication refills or new problem evaluations.   ______________________________________________________________________      ______________________________________________________________________    Preparing for your procedure  Appointments: If you think you may not be able to keep your appointment, call 24-48 hours in advance to cancel. We need time to make it available to others.  Procedure visits are for procedures only. During your procedure appointment there will be: NO Prescription Refills*. NO medication changes or discussions*. NO discussion of disability issues*. NO unrelated pain problem evaluations*. NO evaluations to order other pain procedures*. *These will be addressed at a separate and distinct evaluation encounter on the provider's evaluation schedule and not during procedure days.  Instructions: Food intake: Avoid eating anything solid for at least 8 hours prior to your procedure. Clear liquid intake: You may take clear liquids such as water up to 2 hours prior to your procedure. (No carbonated drinks. No soda.) Transportation: Unless otherwise stated by your physician, bring a driver. (Driver cannot be a Market researcher, Pharmacist, community, or any other form of public transportation.) Morning Medicines: Except for blood thinners, take all of your other morning  medications with a sip of water. Make sure to take your heart and blood pressure medicines. If your blood pressure's lower number is above 100, the case will be rescheduled. Blood thinners: Make sure to stop your blood thinners as instructed.  If you take a blood thinner, but were not instructed to stop it, call our office [phone] and ask to talk to a nurse. Not stopping a blood thinner prior to certain procedures could lead to serious complications. Diabetics on insulin : Notify the staff so that you can be scheduled 1st case in the morning. If your diabetes requires high dose insulin , take only  of your normal insulin  dose the morning of the procedure and notify the staff that you have done so. Preventing infections: Shower with an antibacterial soap the morning of your procedure.  Build-up your immune system: Take 1000 mg of Vitamin C with every meal (3 times a day) the day prior to your procedure. Antibiotics: Inform the nursing staff if you are taking any antibiotics or if you have any conditions that may require antibiotics prior to procedures. (Example: recent joint implants)   Pregnancy: If you are pregnant make sure to notify the nursing staff. Not doing so may result in injury to the fetus, including death.  Sickness: If you have a cold, fever, or any active infections, call and cancel or reschedule your procedure. Receiving steroids while having an infection may result in complications. Arrival: You must be in the facility at least 30 minutes prior to your scheduled procedure. Tardiness: Your scheduled time is also the cutoff time. If you do not arrive at least 15 minutes prior to your procedure, you will be rescheduled.  Children: Do not bring any children with you. Make arrangements to keep them home. Dress appropriately: There is always a possibility that your clothing may get  soiled. Avoid long dresses. Valuables: Do not bring any jewelry or valuables.  Reasons to call and  reschedule or cancel your procedure: (Following these recommendations will minimize the risk of a serious complication.) Surgeries: Avoid having procedures within 2 weeks of any surgery. (Avoid for 2 weeks before or after any surgery). Flu Shots: Avoid having procedures within 2 weeks of a flu shots or . (Avoid for 2 weeks before or after immunizations). Barium: Avoid having a procedure within 7-10 days after having had a radiological study involving the use of radiological contrast. (Myelograms, Barium swallow or enema study). Heart attacks: Avoid any elective procedures or surgeries for the initial 6 months after a "Myocardial Infarction" (Heart Attack). Blood thinners: It is imperative that you stop these medications before procedures. Let us  know if you if you take any blood thinner.  Infection: Avoid procedures during or within two weeks of an infection (including chest colds or gastrointestinal problems). Symptoms associated with infections include: Localized redness, fever, chills, night sweats or profuse sweating, burning sensation when voiding, cough, congestion, stuffiness, runny nose, sore throat, diarrhea, nausea, vomiting, cold or Flu symptoms, recent or current infections. It is specially important if the infection is over the area that we intend to treat. Heart and lung problems: Symptoms that may suggest an active cardiopulmonary problem include: cough, chest pain, breathing difficulties or shortness of breath, dizziness, ankle swelling, uncontrolled high or unusually low blood pressure, and/or palpitations. If you are experiencing any of these symptoms, cancel your procedure and contact your primary care physician for an evaluation.  Remember:  Regular Business hours are:  Monday to Thursday 8:00 AM to 4:00 PM  Provider's Schedule: Renaldo Caroli, MD:  Procedure days: Tuesday and Thursday 7:30 AM to 4:00 PM  Cephus Collin, MD:  Procedure days: Monday and Wednesday 7:30 AM to 4:00  PM Last  Updated: 10/25/2023 ______________________________________________________________________      ______________________________________________________________________    General Risks and Possible Complications  Patient Responsibilities: It is important that you read this as it is part of your informed consent. It is our duty to inform you of the risks and possible complications associated with treatments offered to you. It is your responsibility as a patient to read this and to ask questions about anything that is not clear or that you believe was not covered in this document.  Patient's Rights: You have the right to refuse treatment. You also have the right to change your mind, even after initially having agreed to have the treatment done. However, under this last option, if you wait until the last second to change your mind, you may be charged for the materials used up to that point.  Introduction: Medicine is not an Visual merchandiser. Everything in Medicine, including the lack of treatment(s), carries the potential for danger, harm, or loss (which is by definition: Risk). In Medicine, a complication is a secondary problem, condition, or disease that can aggravate an already existing one. All treatments carry the risk of possible complications. The fact that a side effects or complications occurs, does not imply that the treatment was conducted incorrectly. It must be clearly understood that these can happen even when everything is done following the highest safety standards.  No treatment: You can choose not to proceed with the proposed treatment alternative. The "PRO(s)" would include: avoiding the risk of complications associated with the therapy. The "CON(s)" would include: not getting any of the treatment benefits. These benefits fall under one of three categories: diagnostic; therapeutic; and/or  palliative. Diagnostic benefits include: getting information which can ultimately lead to  improvement of the disease or symptom(s). Therapeutic benefits are those associated with the successful treatment of the disease. Finally, palliative benefits are those related to the decrease of the primary symptoms, without necessarily curing the condition (example: decreasing the pain from a flare-up of a chronic condition, such as incurable terminal cancer).  General Risks and Complications: These are associated to most interventional treatments. They can occur alone, or in combination. They fall under one of the following six (6) categories: no benefit or worsening of symptoms; bleeding; infection; nerve damage; allergic reactions; and/or death. No benefits or worsening of symptoms: In Medicine there are no guarantees, only probabilities. No healthcare provider can ever guarantee that a medical treatment will work, they can only state the probability that it may. Furthermore, there is always the possibility that the condition may worsen, either directly, or indirectly, as a consequence of the treatment. Bleeding: This is more common if the patient is taking a blood thinner, either prescription or over the counter (example: Goody Powders, Fish oil, Aspirin , Garlic, etc.), or if suffering a condition associated with impaired coagulation (example: Hemophilia, cirrhosis of the liver, low platelet counts, etc.). However, even if you do not have one on these, it can still happen. If you have any of these conditions, or take one of these drugs, make sure to notify your treating physician. Infection: This is more common in patients with a compromised immune system, either due to disease (example: diabetes, cancer, human immunodeficiency virus [HIV], etc.), or due to medications or treatments (example: therapies used to treat cancer and rheumatological diseases). However, even if you do not have one on these, it can still happen. If you have any of these conditions, or take one of these drugs, make sure to notify  your treating physician. Nerve Damage: This is more common when the treatment is an invasive one, but it can also happen with the use of medications, such as those used in the treatment of cancer. The damage can occur to small secondary nerves, or to large primary ones, such as those in the spinal cord and brain. This damage may be temporary or permanent and it may lead to impairments that can range from temporary numbness to permanent paralysis and/or brain death. Allergic Reactions: Any time a substance or material comes in contact with our body, there is the possibility of an allergic reaction. These can range from a mild skin rash (contact dermatitis) to a severe systemic reaction (anaphylactic reaction), which can result in death. Death: In general, any medical intervention can result in death, most of the time due to an unforeseen complication. ______________________________________________________________________      ______________________________________________________________________    Blood Thinners  IMPORTANT NOTICE:  If you take any of these, make sure to notify the nursing staff.  Failure to do so may result in serious injury.  Recommended time intervals to stop and restart blood-thinners, before & after invasive procedures  Generic Name Brand Name Pre-procedure: Stop medication for this amount of time before your procedure: Post-procedure: Wait this amount of time after the procedure before restarting your medication:  Abciximab Reopro 15 days 2 hrs  Alteplase Activase 10 days 10 days  Anagrelide Agrylin    Apixaban Eliquis 3 days 6 hrs  Cilostazol Pletal 3 days 5 hrs  Clopidogrel  Plavix  7-10 days 2 hrs  Dabigatran Pradaxa 5 days 6 hrs  Dalteparin Fragmin 24 hours 4 hrs  Dipyridamole Aggrenox 11days 2  hrs  Edoxaban Lixiana; Savaysa 3 days 2 hrs  Enoxaparin   Lovenox  24 hours 4 hrs  Eptifibatide Integrillin 8 hours 2 hrs  Fondaparinux  Arixtra 72 hours 12 hrs   Hydroxychloroquine Plaquenil 11 days   Prasugrel Effient 7-10 days 6 hrs  Reteplase Retavase 10 days 10 days  Rivaroxaban Xarelto 3 days 6 hrs  Ticagrelor Brilinta 5-7 days 6 hrs  Ticlopidine Ticlid 10-14 days 2 hrs  Tinzaparin Innohep 24 hours 4 hrs  Tirofiban Aggrastat 8 hours 2 hrs  Warfarin Coumadin 5 days 2 hrs   Other medications with blood-thinning effects  NOTE: Consider stopping these if you have prolonged bleeding despite not taking any of the above blood thinners. Otherwise ask your provider and this will be decided on a case-by-case basis.  Product indications Generic (Brand) names Note  Cholesterol Lipitor Stop 4 days before procedure  Blood thinner (injectable) Heparin  (LMW or LMWH Heparin ) Stop 24 hours before procedure  Cancer Ibrutinib (Imbruvica) Stop 7 days before procedure  Malaria/Rheumatoid Hydroxychloroquine (Plaquenil) Stop 11 days before procedure  Thrombolytics  10 days before or after procedures   Over-the-counter (OTC) Products with blood-thinning effects  NOTE: Consider stopping these if you have prolonged bleeding despite not taking any of the above blood thinners. Otherwise ask your provider and this will be decided on a case-by-case basis.  Product Common names Stop Time  Aspirin  > 325 mg Goody Powders, Excedrin, etc. 11 days  Aspirin  <= 81 mg  7 days  Fish oil  4 days  Garlic supplements  7 days  Ginkgo biloba  36 hours  Ginseng  24 hours  NSAIDs Ibuprofen, Naprosyn, etc. 3 days  Vitamin E  4 days   ______________________________________________________________________   Stop Plavix  7 days before your procedure.

## 2024-03-15 NOTE — Progress Notes (Signed)
 PROVIDER NOTE: Interpretation of information contained herein should be left to medically-trained personnel. Specific patient instructions are provided elsewhere under "Patient Instructions" section of medical record. This document was created in part using AI and STT-dictation technology, any transcriptional errors that may result from this process are unintentional.  Patient: Kristie Olson  Service: E/M   PCP: [facility]  DOB: [birth year]  DOS: 03/15/2024  Provider: Candi Chafe, MD  MRN: [id number]  Delivery: Face-to-face  Specialty: Interventional Pain Management  Type: Established Patient  Setting: Ambulatory outpatient facility  Specialty designation: 09  Referring Prov.: [facility]*  Location: Outpatient office facility       HPI  Ms. Kristie Olson, a 68 y.o. year old female, is here today because of her Chronic bilateral low back pain without sciatica [M54.50, G89.29]. Kristie Olson's primary complain today is Back Pain (lower)  Pertinent problems: Kristie Olson has Cervical radiculopathy; Cervical spondylosis without myelopathy; Peripheral neuropathy; Type 2 diabetes mellitus with diabetic neuropathy (HCC); Other specified joint disorders, unspecified shoulder; PAD (peripheral artery disease) (HCC); Claudication of lower extremities (Bilateral) (HCC); Critical limb ischemia of lower extremities (Bilateral) (HCC); Chronic pain syndrome; Abnormal MRI, lumbar spine (11/20/2022); Lumbar facet arthropathy; Lumbar facet joint pain; Osteoarthritis of facet joint of lumbar spine; Osteoarthritis of lumbar spine; Spondylosis without myelopathy or radiculopathy, lumbosacral region; Atherosclerotic peripheral vascular disease (HCC); Chronic hip pain (2ry area of Pain) (Bilateral); Chronic feet pain (3ry area of Pain) (Bilateral); Burning pain; Chronic painful diabetic neuropathy (HCC); Chronic low back pain (1ry area of Pain) (Bilateral) w/o sciatica; and Lumbar facet  hypertrophy (Multilevel) (Bilateral) on their pertinent problem list. Pain Assessment: Severity of Chronic pain is reported as a 10-Worst pain ever/10. Location: Back Lower/both groin, left hip. Onset: More than a month ago. Quality: Aching, Sharp. Timing: Constant. Modifying factor(s): positioning. Vitals:  height is 5\' 3"  (1.6 m) and weight is 207 lb (93.9 kg). Her temporal temperature is 97.2 F (36.2 C) (abnormal). Her blood pressure is 138/73 and her pulse is 76. Her respiration is 16 and oxygen saturation is 99%.  BMI: Estimated body mass index is 36.67 kg/m as calculated from the following:   Height as of this encounter: 5\' 3"  (1.6 m).   Weight as of this encounter: 207 lb (93.9 kg). Last encounter: 01/25/2024. Last procedure: 02/16/2024.  Reason for encounter: post-procedure evaluation and assessment.   Discussed the use of AI scribe software for clinical note transcription with the patient, who gave verbal consent to proceed.  History of Present Illness   Kristie Olson is a 68 year old female with osteoarthritis of the spine who presents with back and leg pain.  She experiences pain in her legs and feet. The pain in her legs improved following a numbing block procedure, but the pain in her feet persists.  She has a history of osteoarthritis in her spine, which contributes to her back pain.  She previously attempted physical therapy for her back pain, but it did not provide relief and she did not complete the therapy.      Post-procedure evaluation   Type: Lumbar Facet, Medial Branch Block(s) (w/ fluoroscopic mapping) #1  Laterality: Bilateral  Level: L2, L3, L4, L5, and S1 Medial Branch Level(s). Injecting these levels blocks the L3-4, L4-5, and L5-S1 lumbar facet joints. Imaging: Fluoroscopic guidance Spinal ([id number]) Anesthesia: Local anesthesia (1-2% Lidocaine ) Anxiolysis: IV Versed  3.0 mg Sedation: Moderate Sedation None required. No Fentanyl  administered.  DOS:  02/16/2024 Performed by: Candi Chafe, MD  Primary Purpose: Diagnostic/Therapeutic Indications: Low back pain severe enough to impact quality of life or function. 1. Chronic low back pain (1ry area of Pain) (Bilateral) w/o sciatica   2. Lumbar facet joint pain   3. Lumbar facet arthropathy   4. Lumbar facet hypertrophy (Multilevel) (Bilateral)   5. Osteoarthritis of facet joint of lumbar spine   6. Spondylosis without myelopathy or radiculopathy, lumbosacral region   7. Chronic anticoagulation (Plavix )    NAS-11 Pain score:   Pre-procedure: 7 /10   Post-procedure: 0-No pain/10    Effectiveness:  Initial hour after procedure: 100 %. Subsequent 4-6 hours post-procedure: 100 %. Analgesia past initial 6 hours:  (80  lasted 3-4 days). Ongoing improvement:  Analgesic: The patient indicates having attained 100% relief of the pain for the duration of the local anesthetic followed by a decrease to an 80% improvement that lasted for approximately 3 to 4 days at which time then it slowly started coming back. Function: Transient improvement ROM: Transient improvement   Pharmacotherapy Assessment  Analgesic: No chronic opioid analgesics therapy prescribed by our practice. None MME/day: 0 mg/day   Monitoring: [place] PMP: PDMP reviewed during this encounter.       Pharmacotherapy: No side-effects or adverse reactions reported. Compliance: No problems identified. Effectiveness: Clinically acceptable.  No notes on file  No results found for: "CBDTHCR" No results found for: "D8THCCBX" No results found for: "D9THCCBX"  UDS:  Summary  Date Value Ref Range Status  12/12/2023 FINAL  Final    Comment:    ==================================================================== Compliance Drug Analysis, Ur ==================================================================== Test                             Result       Flag       Units  Drug Present and Declared for Prescription  Verification   Fluoxetine                      PRESENT      EXPECTED   Norfluoxetine                  PRESENT      EXPECTED    Norfluoxetine is an expected metabolite of fluoxetine .    Acetaminophen                   PRESENT      EXPECTED  Drug Present not Declared for Prescription Verification   Diphenhydramine                 PRESENT      UNEXPECTED  Drug Absent but Declared for Prescription Verification   Tizanidine                      Not Detected UNEXPECTED    Tizanidine , as indicated in the declared medication list, is not    always detected even when used as directed.    Amitriptyline                   Not Detected UNEXPECTED   Salicylate                     Not Detected UNEXPECTED    Aspirin , as indicated in the declared medication list, is not always    detected even when used as directed.  ==================================================================== Test  Result    Flag   Units      Ref Range   Creatinine              51               mg/dL      >=91 ==================================================================== Declared Medications:  The flagging and interpretation on this report are based on the  following declared medications.  Unexpected results may arise from  inaccuracies in the declared medications.   **Note: The testing scope of this panel includes these medications:   Amitriptyline  (Elavil )  Fluoxetine  (Prozac )   **Note: The testing scope of this panel does not include small to  moderate amounts of these reported medications:   Acetaminophen  (Tylenol )  Aspirin   Tizanidine  (Zanaflex )   **Note: The testing scope of this panel does not include the  following reported medications:   Albuterol  (Ventolin  HFA)  Atorvastatin  (Lipitor)  Clopidogrel  (Plavix )  Esomeprazole (Nexium)  Fluticasone  (Advair)  Glipizide  Hydrochlorothiazide  (Hyzaar)  Insulin   Losartan (Hyzaar)  Pantoprazole  (Protonix )  Salmeterol (Advair)   Triamcinolone  (Kenalog ) ==================================================================== For clinical consultation, please call [phone]. ====================================================================       ROS  Constitutional: Denies any fever or chills Gastrointestinal: No reported hemesis, hematochezia, vomiting, or acute GI distress Musculoskeletal: Denies any acute onset joint swelling, redness, loss of ROM, or weakness Neurological: No reported episodes of acute onset apraxia, aphasia, dysarthria, agnosia, amnesia, paralysis, loss of coordination, or loss of consciousness  Medication Review  Aspirin -Salicylamide-Caffeine, FLUoxetine , FreeStyle Libre 2 Sensor, Vitamin D3, acetaminophen , albuterol , amitriptyline , aspirin  EC, atorvastatin , clopidogrel , fluticasone -salmeterol, gabapentin , glipiZIDE, insulin  NPH-regular Human, linagliptin, and lisinopril -hydrochlorothiazide   History Review  Allergy: Kristie Olson is allergic to ibuprofen, nsaids, and percocet [oxycodone -acetaminophen ]. Drug: Kristie Olson  reports that she does not currently use drugs. Alcohol:  reports no history of alcohol use. Tobacco:  reports that she has been smoking cigarettes. She has a 46 pack-year smoking history. She has never used smokeless tobacco. Social: Ms. Suh  reports that she has been smoking cigarettes. She has a 46 pack-year smoking history. She has never used smokeless tobacco. She reports that she does not currently use drugs. She reports that she does not drink alcohol. Medical:  has a past medical history of Asthma, Coronary artery disease, Diabetes mellitus without complication (HCC) (1990), Fibromyalgia, Hypertension, and PAD (peripheral artery disease) (HCC) (09/03/2022). Surgical: Kristie Olson  has a past surgical history that includes Cesarean section; Breast mass excision (Left); Cholecystectomy (2009); Breast excisional biopsy (Left, 1980's); Lower Extremity Angiography (Left,  09/03/2022); Lower Extremity Angiography (Right, 09/08/2022); Lower Extremity Angiography (Left, 01/18/2023); Lower Extremity Angiography (Left, 02/14/2023); and Lower Extremity Angiography (Left, 06/27/2023). Family: family history includes Breast cancer in her paternal aunt; Breast cancer (age of onset: 87) in her sister; Cancer in her father, mother, and sister; Diabetes in her mother.  Laboratory Chemistry Profile   Renal Lab Results  Component Value Date   BUN 15 12/31/2023   CREATININE 0.75 12/31/2023   GFRAA >60 07/01/2020   GFRNONAA >60 12/31/2023    Hepatic Lab Results  Component Value Date   AST 20 12/12/2023   ALT 14 12/12/2023   ALBUMIN 4.1 12/12/2023   ALKPHOS 81 12/12/2023   LIPASE 69 (H) 04/28/2020    Electrolytes Lab Results  Component Value Date   NA 138 12/31/2023   K 3.3 (L) 12/31/2023   CL 99 12/31/2023   CALCIUM  9.1 12/31/2023   MG 1.9 12/31/2023    Bone Lab Results  Component  Value Date   VD25OH 13.48 (L) 12/12/2023    Inflammation (CRP: Acute Phase) (ESR: Chronic Phase) Lab Results  Component Value Date   CRP 0.5 12/12/2023   ESRSEDRATE 28 12/12/2023         Note: Above Lab results reviewed.  Recent Imaging Review  DG PAIN CLINIC C-ARM 1-60 MIN NO REPORT Fluoro was used, but no Radiologist interpretation will be provided.  Please refer to "NOTES" tab for provider progress note. Note: Reviewed        Physical Exam  General appearance: Well nourished, well developed, and well hydrated. In no apparent acute distress Mental status: Alert, oriented x 3 (person, place, & time)       Respiratory: No evidence of acute respiratory distress Eyes: PERLA Vitals: BP 138/73 (Cuff Size: Normal)   Pulse 76   Temp (!) 97.2 F (36.2 C) (Temporal)   Resp 16   Ht 5\' 3"  (1.6 m)   Wt 207 lb (93.9 kg)   SpO2 99%   BMI 36.67 kg/m  BMI: Estimated body mass index is 36.67 kg/m as calculated from the following:   Height as of this encounter: 5\' 3"  (1.6 m).    Weight as of this encounter: 207 lb (93.9 kg). Ideal: Ideal body weight: 52.4 kg (115 lb 8.3 oz) Adjusted ideal body weight: 69 kg (152 lb 1.8 oz)  Assessment   Diagnosis Status  1. Chronic low back pain (1ry area of Pain) (Bilateral) w/o sciatica   2. Lumbar facet joint pain   3. Chronic hip pain (2ry area of Pain) (Bilateral)   4. Lumbar facet hypertrophy (Multilevel) (Bilateral)   5. Lumbar facet arthropathy   6. Spondylosis without myelopathy or radiculopathy, lumbosacral region   7. Chronic feet pain (3ry area of Pain) (Bilateral)   8. Postop check   9. Chronic anticoagulation (Plavix )    Controlled Controlled Controlled   Updated Problems: Problem  Disorder of Bursae of Shoulder Region    Plan of Care  Problem-specific:  Assessment and Plan    Osteoarthritis of spine   Osteoarthritis of the spine is confirmed by relief of leg pain following a diagnostic nerve block, while foot pain persists, indicating it is unrelated to spinal arthritis. The condition is due to degenerative changes and exacerbated by obesity. Previous physical therapy was ineffective, a prerequisite for further interventions per insurance requirements. Insurance mandates two diagnostic injections before considering radiofrequency ablation. Repeat the diagnostic nerve block to assess for consistent pain relief. If pain relief is consistent but transient, plan for radiofrequency ablation of affected joints. Document previous physical therapy attempts for insurance purposes.  Obesity   Obesity contributes to spinal osteoarthritis. Weight reduction is essential to prevent further joint damage and enhance pain management efficacy. She is advised to consult with a primary care physician for weight management, as it is beyond the scope of this specialty. Emphasize the importance of weight reduction and advise consultation with a primary care physician for weight management.       Kristie Olson has a current  medication list which includes the following long-term medication(s): advair diskus, albuterol , fluoxetine , glipizide, insulin  nph-regular human, tradjenta, and atorvastatin .  Pharmacotherapy (Medications Ordered): No orders of the defined types were placed in this encounter.  Orders:  Orders Placed This Encounter  Procedures   LUMBAR FACET(MEDIAL BRANCH NERVE BLOCK) MBNB    Diagnosis: Lumbar Facet Syndrome (M47.816); Lumbosacral Facet Syndrome (M47.817); Lumbar Facet Joint Pain (M54.59) Medical Necessity Statement: 1.Severe chronic axial low back  pain causing functional impairment documented by ongoing pain scale assessments. 2.Pain present for longer than 3 months (Chronic) documented to have failed noninvasive conservative therapies. 3.Absence of untreated radiculopathy. 4.There is no radiological evidence of untreated fractures, tumor, infection, or deformity.  Physical Examination Findings: Positive Kemp Maneuver: (Y)  Positive Lumbar Hyperextension-Rotation provocative test: (Y)    Standing Status:   Future    Expiration Date:   06/15/2024    Scheduling Instructions:     Procedure: Lumbar facet Block     Type: Medial Branch Block     Side: Bilateral     Purpose: Diagnostic/Therapeutic     Level(s): L3-4, L4-5, and L5-S1 Facets (L2, L3, L4, L5, and S1 Medial Branch)     Sedation: With Sedation.     Timeframe: As soon as schedule allows.    Where will this procedure be performed?:   [facility]   Nursing Instructions:    Please complete this patient's postprocedure evaluation.    Scheduling Instructions:     Please complete this patient's postprocedure evaluation.   Blood Thinner Instructions to Nursing    Always make sure patient has clearance from prescribing physician to stop blood thinners for interventional therapies. If the patient requires a Lovenox -bridge therapy, make sure arrangements are made to institute it with the assistance of the PCP.    Scheduling  Instructions:     Have Kristie Olson stop the Plavix  (Clopidogrel ) x 7-10 days prior to procedure or surgery.   Follow-up plan:   Return for (ECT):(B) L-FCT Blk #2, (Blood Thinner Protocol).     Interventional Therapies  Risk Factors  Considerations  Medical Comorbidities:  Plavix  Anticoagulation: (Stop: 7-10 days  Restart: 2 hours)  CAD  T2IDDM  BA  HTN  RBBB  Anxiety/Depression  FM  MO (BMI>40)          Planned  Pending:   Diagnostic bilateral lumbar facet MBB #1  Therapeutic bilateral Qutenza treatment #1    Under consideration:   Diagnostic bilateral lumbar facet MBB #1  Therapeutic bilateral Qutenza treatment #1    Completed:   None at this time   Therapeutic  Palliative (PRN) options:   None established   Completed by other providers:   Diagnostic bilateral L3-L5 lumbar facet MBB x1 (04/07/2023) by Candi Chafe, MD [facility])  Diagnostic bilateral L5-S1 TFESI x2 (12/30/2022, 03/04/2023) by Candi Chafe, MD [facility])  Referral to neurology for lower extremity EMG/PNCV (diabetic peripheral neuropathy vs ischemic neuropathy) (study apparently done by Dr. Cullen Dose)  (01/09/2024) [facility] neurology)      Recent Visits Date Type Provider Dept  02/16/24 Procedure visit Renaldo Caroli, MD [facility]  01/25/24 Office Visit Renaldo Caroli, MD [facility]  Showing recent visits within past 90 days and meeting all other requirements Today's Visits Date Type Provider Dept  03/15/24 Office Visit Renaldo Caroli, MD [facility]  Showing today's visits and meeting all other requirements Future Appointments Date Type Provider Dept  03/29/24 Appointment Renaldo Caroli, MD [facility]  Showing future appointments within next 90 days and meeting all other requirements  I discussed the assessment and treatment plan with the patient. The patient was provided an opportunity to ask questions and all were answered.  The patient agreed with the plan and demonstrated an understanding of the instructions.  Patient advised to call back or seek an in-person evaluation if the symptoms or condition worsens.  Duration of encounter: 30 minutes.  Total time on encounter,  as per AMA guidelines included both the face-to-face and non-face-to-face time personally spent by the physician and/or other qualified health care professional(s) on the day of the encounter (includes time in activities that require the physician or other qualified health care professional and does not include time in activities normally performed by clinical staff). Physician's time may include the following activities when performed: Preparing to see the patient (e.g., pre-charting review of records, searching for previously ordered imaging, lab work, and nerve conduction tests) Review of prior analgesic pharmacotherapies. Reviewing PMP Interpreting ordered tests (e.g., lab work, imaging, nerve conduction tests) Performing post-procedure evaluations, including interpretation of diagnostic procedures Obtaining and/or reviewing separately obtained history Performing a medically appropriate examination and/or evaluation Counseling and educating the patient/family/caregiver Ordering medications, tests, or procedures Referring and communicating with other health care professionals (when not separately reported) Documenting clinical information in the electronic or other health record Independently interpreting results (not separately reported) and communicating results to the patient/ family/caregiver Care coordination (not separately reported)  Note by: Candi Chafe, MD (TTS and AI technology used. I apologize for any typographical errors that were not detected and corrected.) Date: 03/15/2024; Time: 2:02 PM

## 2024-03-21 ENCOUNTER — Other Ambulatory Visit (INDEPENDENT_AMBULATORY_CARE_PROVIDER_SITE_OTHER): Payer: Self-pay | Admitting: Nurse Practitioner

## 2024-03-28 NOTE — Patient Instructions (Signed)

## 2024-03-29 ENCOUNTER — Ambulatory Visit: Attending: Pain Medicine | Admitting: Pain Medicine

## 2024-03-29 ENCOUNTER — Encounter: Payer: Self-pay | Admitting: Pain Medicine

## 2024-03-29 ENCOUNTER — Ambulatory Visit
Admission: RE | Admit: 2024-03-29 | Discharge: 2024-03-29 | Disposition: A | Discharge status: Home / Self Care | Source: Ambulatory Visit | Attending: Pain Medicine | Admitting: Pain Medicine

## 2024-03-29 VITALS — BP 172/78 | HR 73 | Temp 97.1°F | Resp 17 | Ht 60.0 in | Wt 207.0 lb

## 2024-03-29 DIAGNOSIS — M47817 Spondylosis without myelopathy or radiculopathy, lumbosacral region: Secondary | ICD-10-CM

## 2024-03-29 DIAGNOSIS — M47816 Spondylosis without myelopathy or radiculopathy, lumbar region: Secondary | ICD-10-CM | POA: Diagnosis present

## 2024-03-29 DIAGNOSIS — G8929 Other chronic pain: Secondary | ICD-10-CM | POA: Diagnosis not present

## 2024-03-29 DIAGNOSIS — M5459 Other low back pain: Secondary | ICD-10-CM | POA: Insufficient documentation

## 2024-03-29 DIAGNOSIS — Z7901 Long term (current) use of anticoagulants: Secondary | ICD-10-CM

## 2024-03-29 DIAGNOSIS — M545 Low back pain, unspecified: Secondary | ICD-10-CM

## 2024-03-29 MED ORDER — ROPIVACAINE HCL 2 MG/ML IJ SOLN
18.0000 mL | Freq: Once | INTRAMUSCULAR | Status: AC
  Administered 2024-03-29: 18 mL via PERINEURAL

## 2024-03-29 MED ORDER — PENTAFLUOROPROP-TETRAFLUOROETH EX AERO
INHALATION_SPRAY | Freq: Once | CUTANEOUS | Status: AC
Start: 2024-03-29 — End: 2024-03-29
  Administered 2024-03-29: 30 via TOPICAL

## 2024-03-29 MED ORDER — LIDOCAINE HCL 2 % IJ SOLN
INTRAMUSCULAR | Status: AC
  Filled 2024-03-29: qty 20

## 2024-03-29 MED ORDER — TRIAMCINOLONE ACETONIDE 40 MG/ML IJ SUSP
80.0000 mg | Freq: Once | INTRAMUSCULAR | Status: AC
Start: 2024-03-29 — End: 2024-03-29
  Administered 2024-03-29: 80 mg

## 2024-03-29 MED ORDER — FENTANYL CITRATE (PF) 100 MCG/2ML IJ SOLN
INTRAMUSCULAR | Status: AC
  Filled 2024-03-29: qty 2

## 2024-03-29 MED ORDER — MIDAZOLAM HCL 5 MG/5ML IJ SOLN
INTRAMUSCULAR | Status: AC
  Filled 2024-03-29: qty 5

## 2024-03-29 MED ORDER — ROPIVACAINE HCL 2 MG/ML IJ SOLN
INTRAMUSCULAR | Status: AC
  Filled 2024-03-29: qty 20

## 2024-03-29 MED ORDER — TRIAMCINOLONE ACETONIDE 40 MG/ML IJ SUSP
INTRAMUSCULAR | Status: AC
Start: 2024-03-29 — End: ?
  Filled 2024-03-29: qty 2

## 2024-03-29 MED ORDER — FENTANYL CITRATE (PF) 100 MCG/2ML IJ SOLN: 25.0000 ug | INTRAMUSCULAR | Status: DC | PRN

## 2024-03-29 MED ORDER — MIDAZOLAM HCL 5 MG/5ML IJ SOLN
0.5000 mg | Freq: Once | INTRAMUSCULAR | Status: AC
  Administered 2024-03-29: 3 mg via INTRAVENOUS

## 2024-03-29 MED ORDER — LIDOCAINE HCL 2 % IJ SOLN
20.0000 mL | Freq: Once | INTRAMUSCULAR | Status: AC
  Administered 2024-03-29: 400 mg

## 2024-03-29 NOTE — Progress Notes (Signed)
 PROVIDER NOTE: Interpretation of information contained herein should be left to medically-trained personnel. Specific patient instructions are provided elsewhere under "Patient Instructions" section of medical record. This document was created in part using STT-dictation technology, any transcriptional errors that may result from this process are unintentional.  Patient: Kristie Olson Type: Established DOB: Dec 11, 1955 MRN: [id number] PCP: [facility]  Service: Procedure DOS: 03/29/2024 Setting: Ambulatory Location: Ambulatory outpatient facility Delivery: Face-to-face Provider: Candi Chafe, MD Specialty: Interventional Pain Management Specialty designation: 09 Location: Outpatient facility Ref. Prov.: [facility]       Interventional Therapy   Type: Lumbar Facet, Medial Branch Block(s)   #2  Laterality: Bilateral  Level: L2, L3, L4, L5, and S1 Medial Branch Level(s). Injecting these levels blocks the L3-4, L4-5, and L5-S1 lumbar facet joints.  Imaging: Fluoroscopic guidance Spinal ([id number]) Anesthesia: Local anesthesia (1-2% Lidocaine ) Anxiolysis: IV Versed          Sedation: Moderate Sedation                       DOS: 03/29/2024 Performed by: Candi Chafe, MD  Primary Purpose: Diagnostic/Therapeutic Indications: Low back pain severe enough to impact quality of life or function. 1. Chronic low back pain (1ry area of Pain) (Bilateral) w/o sciatica   2. Lumbar facet joint pain   3. Lumbar facet hypertrophy (Multilevel) (Bilateral)   4. Lumbar facet arthropathy   5. Osteoarthritis of facet joint of lumbar spine   6. Lumbar facet joint syndrome   7. Spondylosis without myelopathy or radiculopathy, lumbosacral region   8. Low back pain of over 3 months duration   9. Mechanical low back pain   10. Multifactorial low back pain   11. Chronic anticoagulation (Plavix )    NAS-11 Pain score:   Pre-procedure: 8 /10   Post-procedure: 0-No  pain/10     Position / Prep / Materials:  Position: Prone  Prep solution: ChloraPrep (2% chlorhexidine gluconate and 70% isopropyl alcohol) Area Prepped: Posterolateral Lumbosacral Spine (Wide prep: From the lower border of the scapula down to the end of the tailbone and from flank to flank.)  Materials:  Tray: Block Needle(s):  Type: Spinal  Gauge (G): 22  Length: 5-in Qty: 4     H&P (Pre-op Assessment):  Kristie Olson is a 68 y.o. (year old), female patient, seen today for interventional treatment. She  has a past surgical history that includes Cesarean section; Breast mass excision (Left); Cholecystectomy (2009); Breast excisional biopsy (Left, 1980's); Lower Extremity Angiography (Left, 09/03/2022); Lower Extremity Angiography (Right, 09/08/2022); Lower Extremity Angiography (Left, 01/18/2023); Lower Extremity Angiography (Left, 02/14/2023); and Lower Extremity Angiography (Left, 06/27/2023). Kristie Olson has a current medication list which includes the following prescription(s): acetaminophen , advair diskus, albuterol , amitriptyline , aspirin  ec, aspirin -salicylamide-caffeine, vitamin d3, clopidogrel , freestyle libre 2 sensor, fluoxetine , gabapentin , glipizide, insulin  nph-regular human, lisinopril -hydrochlorothiazide , tradjenta, and atorvastatin , and the following Facility-Administered Medications: fentanyl . Her primarily concern today is the Back Pain (lower)  Initial Vital Signs:  Pulse/HCG Rate: 73ECG Heart Rate: 83 (nsr) Temp: (!) 97.3 F (36.3 C) Resp: 18 BP: (!) 150/82 SpO2: 100 %  BMI: Estimated body mass index is 40.43 kg/m as calculated from the following:   Height as of this encounter: 5' (1.524 m).   Weight as of this encounter: 207 lb (93.9 kg).  Risk Assessment: Allergies: Reviewed. She is allergic to ibuprofen, nsaids, and percocet [oxycodone -acetaminophen ].  Allergy Precautions: None required Coagulopathies: Reviewed. None identified.  Blood-thinner therapy: None at this  time Active Infection(s): Reviewed. None identified. Kristie Olson is afebrile  Site Confirmation: Kristie Olson was asked to confirm the procedure and laterality before marking the site Procedure checklist: Completed Consent: Before the procedure and under the influence of no sedative(s), amnesic(s), or anxiolytics, the patient was informed of the treatment options, risks and possible complications. To fulfill our ethical and legal obligations, as recommended by the American Medical Association's Code of Ethics, I have informed the patient of my clinical impression; the nature and purpose of the treatment or procedure; the risks, benefits, and possible complications of the intervention; the alternatives, including doing nothing; the risk(s) and benefit(s) of the alternative treatment(s) or procedure(s); and the risk(s) and benefit(s) of doing nothing. The patient was provided information about the general risks and possible complications associated with the procedure. These may include, but are not limited to: failure to achieve desired goals, infection, bleeding, organ or nerve damage, allergic reactions, paralysis, and death. In addition, the patient was informed of those risks and complications associated to Spine-related procedures, such as failure to decrease pain; infection (i.e.: Meningitis, epidural or intraspinal abscess); bleeding (i.e.: epidural hematoma, subarachnoid hemorrhage, or any other type of intraspinal or peri-dural bleeding); organ or nerve damage (i.e.: Any type of peripheral nerve, nerve root, or spinal cord injury) with subsequent damage to sensory, motor, and/or autonomic systems, resulting in permanent pain, numbness, and/or weakness of one or several areas of the body; allergic reactions; (i.e.: anaphylactic reaction); and/or death. Furthermore, the patient was informed of those risks and complications associated with the medications. These include, but are not limited to: allergic  reactions (i.e.: anaphylactic or anaphylactoid reaction(s)); adrenal axis suppression; blood sugar elevation that in diabetics may result in ketoacidosis or comma; water retention that in patients with history of congestive heart failure may result in shortness of breath, pulmonary edema, and decompensation with resultant heart failure; weight gain; swelling or edema; medication-induced neural toxicity; particulate matter embolism and blood vessel occlusion with resultant organ, and/or nervous system infarction; and/or aseptic necrosis of one or more joints. Finally, the patient was informed that Medicine is not an exact science; therefore, there is also the possibility of unforeseen or unpredictable risks and/or possible complications that may result in a catastrophic outcome. The patient indicated having understood very clearly. We have given the patient no guarantees and we have made no promises. Enough time was given to the patient to ask questions, all of which were answered to the patient's satisfaction. Kristie Olson has indicated that she wanted to continue with the procedure. Attestation: I, the ordering provider, attest that I have discussed with the patient the benefits, risks, side-effects, alternatives, likelihood of achieving goals, and potential problems during recovery for the procedure that I have provided informed consent. Date  Time: 03/29/2024 10:16 AM  Pre-Procedure Preparation:  Monitoring: As per clinic protocol. Respiration, ETCO2, SpO2, BP, heart rate and rhythm monitor placed and checked for adequate function Safety Precautions: Patient was assessed for positional comfort and pressure points before starting the procedure. Time-out: I initiated and conducted the "Time-out" before starting the procedure, as per protocol. The patient was asked to participate by confirming the accuracy of the "Time Out" information. Verification of the correct person, site, and procedure were performed and  confirmed by me, the nursing staff, and the patient. "Time-out" conducted as per Joint Commission's Universal Protocol (UP.01.01.01). Time: 1058 Start Time: 1058 hrs.  Description of Procedure:          Laterality: (see above) Targeted Levels: (  see above)  Safety Precautions: Aspiration looking for blood return was conducted prior to all injections. At no point did we inject any substances, as a needle was being advanced. Before injecting, the patient was told to immediately notify me if she was experiencing any new onset of "ringing in the ears, or metallic taste in the mouth". No attempts were made at seeking any paresthesias. Safe injection practices and needle disposal techniques used. Medications properly checked for expiration dates. SDV (single dose vial) medications used. After the completion of the procedure, all disposable equipment used was discarded in the proper designated medical waste containers. Local Anesthesia: Protocol guidelines were followed. The patient was positioned over the fluoroscopy table. The area was prepped in the usual manner. The time-out was completed. The target area was identified using fluoroscopy. A 12-in long, straight, sterile hemostat was used with fluoroscopic guidance to locate the targets for each level blocked. Once located, the skin was marked with an approved surgical skin marker. Once all sites were marked, the skin (epidermis, dermis, and hypodermis), as well as deeper tissues (fat, connective tissue and muscle) were infiltrated with a small amount of a short-acting local anesthetic, loaded on a 10cc syringe with a 25G, 1.5-in  Needle. An appropriate amount of time was allowed for local anesthetics to take effect before proceeding to the next step. Local Anesthetic: Lidocaine  2.0% The unused portion of the local anesthetic was discarded in the proper designated containers. Technical description of process:  L2 Medial Branch Nerve Block (MBB): The target  area for the L2 medial branch is at the junction of the postero-lateral aspect of the superior articular process and the superior, posterior, and medial edge of the transverse process of L3. Under fluoroscopic guidance, a Quincke needle was inserted until contact was made with os over the superior postero-lateral aspect of the pedicular shadow (target area). After negative aspiration for blood, 0.5 mL of the nerve block solution was injected without difficulty or complication. The needle was removed intact. L3 Medial Branch Nerve Block (MBB): The target area for the L3 medial branch is at the junction of the postero-lateral aspect of the superior articular process and the superior, posterior, and medial edge of the transverse process of L4. Under fluoroscopic guidance, a Quincke needle was inserted until contact was made with os over the superior postero-lateral aspect of the pedicular shadow (target area). After negative aspiration for blood, 0.5 mL of the nerve block solution was injected without difficulty or complication. The needle was removed intact. L4 Medial Branch Nerve Block (MBB): The target area for the L4 medial branch is at the junction of the postero-lateral aspect of the superior articular process and the superior, posterior, and medial edge of the transverse process of L5. Under fluoroscopic guidance, a Quincke needle was inserted until contact was made with os over the superior postero-lateral aspect of the pedicular shadow (target area). After negative aspiration for blood, 0.5 mL of the nerve block solution was injected without difficulty or complication. The needle was removed intact. L5 Medial Branch Nerve Block (MBB): The target area for the L5 medial branch is at the junction of the postero-lateral aspect of the superior articular process and the superior, posterior, and medial edge of the sacral ala. Under fluoroscopic guidance, a Quincke needle was inserted until contact was made with os  over the superior postero-lateral aspect of the pedicular shadow (target area). After negative aspiration for blood, 0.5 mL of the nerve block solution was injected without difficulty or  complication. The needle was removed intact. S1 Medial Branch Nerve Block (MBB): The target area for the S1 medial branch is at the posterior and inferior 6 o'clock position of the L5-S1 facet joint. Under fluoroscopic guidance, the Quincke needle inserted for the L5 MBB was redirected until contact was made with os over the inferior and postero aspect of the sacrum, at the 6 o' clock position under the L5-S1 facet joint (Target area). After negative aspiration for blood, 0.5 mL of the nerve block solution was injected without difficulty or complication. The needle was removed intact.  Once the entire procedure was completed, the treated area was cleaned, making sure to leave some of the prepping solution back to take advantage of its long term bactericidal properties.         Illustration of the posterior view of the lumbar spine and the posterior neural structures. Laminae of L2 through S1 are labeled. DPRL5, dorsal primary ramus of L5; DPRS1, dorsal primary ramus of S1; DPR3, dorsal primary ramus of L3; FJ, facet (zygapophyseal) joint L3-L4; I, inferior articular process of L4; LB1, lateral branch of dorsal primary ramus of L1; IAB, inferior articular branches from L3 medial branch (supplies L4-L5 facet joint); IBP, intermediate branch plexus; MB3, medial branch of dorsal primary ramus of L3; NR3, third lumbar nerve root; S, superior articular process of L5; SAB, superior articular branches from L4 (supplies L4-5 facet joint also); TP3, transverse process of L3.   Facet Joint Innervation (* possible contribution)  L1-2 T12, L1 (L2*)  Medial Branch  L2-3 L1, L2 (L3*)         "          "  L3-4 L2, L3 (L4*)         "          "  L4-5 L3, L4 (L5*)         "          "  L5-S1 L4, L5, S1          "          "     Vitals:   03/29/24 1055 03/29/24 1100 03/29/24 1107 03/29/24 1116  BP: (!) 165/98 (!) 186/90 (!) 160/85 (!) 179/107  Pulse:      Resp: 17 18 18 16   Temp:    (!) 97.1 F (36.2 C)  TempSrc:    Temporal  SpO2: 100% 100% 100% 99%  Weight:      Height:         End Time: 1107 hrs.  Imaging Guidance (Spinal):          Type of Imaging Technique: Fluoroscopy Guidance (Spinal) Indication(s): Fluoroscopy guidance for needle placement to enhance accuracy in procedures requiring precise needle localization for targeted delivery of medication in or near specific anatomical locations not easily accessible without such real-time imaging assistance. Exposure Time: Please see nurses notes. Contrast: None used. Fluoroscopic Guidance: I was personally present during the use of fluoroscopy. "Tunnel Vision Technique" used to obtain the best possible view of the target area. Parallax error corrected before commencing the procedure. "Direction-depth-direction" technique used to introduce the needle under continuous pulsed fluoroscopy. Once target was reached, antero-posterior, oblique, and lateral fluoroscopic projection used confirm needle placement in all planes. Images permanently stored in EMR. Interpretation: No contrast injected. I personally interpreted the imaging intraoperatively. Adequate needle placement confirmed in multiple planes. Permanent images saved into the patient's record.  Post-operative Assessment:  Post-procedure Vital Signs:  Pulse/HCG Rate: 7378 Temp: (!) 97.1 F (36.2 C) Resp: 16 BP: (!) 179/107 SpO2: 99 %  EBL: None  Complications: No immediate post-treatment complications observed by team, or reported by patient.  Note: The patient tolerated the entire procedure well. A repeat set of vitals were taken after the procedure and the patient was kept under observation following institutional policy, for this type of procedure. Post-procedural neurological assessment was  performed, showing return to baseline, prior to discharge. The patient was provided with post-procedure discharge instructions, including a section on how to identify potential problems. Should any problems arise concerning this procedure, the patient was given instructions to immediately contact us , at any time, without hesitation. In any case, we plan to contact the patient by telephone for a follow-up status report regarding this interventional procedure.  Comments:  No additional relevant information.  Plan of Care (POC)  Orders:  Orders Placed This Encounter  Procedures   LUMBAR FACET(MEDIAL BRANCH NERVE BLOCK) MBNB    Scheduling Instructions:     Procedure: Lumbar facet block (AKA.: Lumbosacral medial branch nerve block)     Side: Bilateral     Level: L3-4, L4-5, L5-S1, and TBD Facets (L2, L3, L4, L5, S1, and TBD Medial Branch Nerves)     Sedation: Patient's choice.     Timeframe: Today    Where will this procedure be performed?:   [facility]   DG PAIN [facility] C-ARM 1-60 MIN NO REPORT    Intraoperative interpretation by procedural physician at [facility].    Standing Status:   Standing    Number of Occurrences:   1    Reason for exam::   Assistance in needle guidance and placement for procedures requiring needle placement in or near specific anatomical locations not easily accessible without such assistance.   Informed Consent Details: Physician/Practitioner Attestation; Transcribe to consent form and obtain patient signature    Nursing Order: Transcribe to consent form and obtain patient signature. Note: Always confirm laterality of pain with Kristie Olson, before procedure.    Physician/Practitioner attestation of informed consent for procedure/surgical case:   I, the physician/practitioner, attest that I have discussed with the patient the benefits, risks, side effects, alternatives, likelihood of achieving goals and potential problems during recovery for the  procedure that I have provided informed consent.    Procedure:   Lumbar Facet Block  under fluoroscopic guidance    Physician/Practitioner performing the procedure:   Marq Rebello A. Barth Borne MD    Indication/Reason:   Low Back Pain, with our without leg pain, due to Facet Joint Arthralgia (Joint Pain) Spondylosis (Arthritis of the Spine), without myelopathy or radiculopathy (Nerve Damage).   Care order/instruction: Please confirm that the patient has stopped the Plavix  (Clopidogrel ) x 7-10 days prior to procedure or surgery.    Please confirm that the patient has stopped the Plavix  (Clopidogrel ) x 7-10 days prior to procedure or surgery.    Standing Status:   Standing    Number of Occurrences:   1   Provide equipment / supplies at bedside    Procedure tray: "Block Tray" (Disposable  single use) Skin infiltration needle: Regular 1.5-in, 25-G, (x1) Block Needle type: Spinal Amount/quantity: 4 Size: Medium (5-inch) Gauge: 22G    Standing Status:   Standing    Number of Occurrences:   1    Specify:   Block Tray   Saline lock IV    Have LR [phone] available and administer at 125 mL/hr if patient becomes hypotensive.  Standing Status:   Standing    Number of Occurrences:   1   Bleeding precautions    Standing Status:   Standing    Number of Occurrences:   1   Chronic Opioid Analgesic:   No chronic opioid analgesics therapy prescribed by our practice. None MME/day: 0 mg/day    Medications ordered for procedure: Meds ordered this encounter  Medications   lidocaine  (XYLOCAINE ) 2 % (with pres) injection 400 mg   pentafluoroprop-tetrafluoroeth (GEBAUERS) aerosol   midazolam  (VERSED ) 5 MG/5ML injection 0.5-2 mg    Make sure Flumazenil is available in the pyxis when using this medication. If oversedation occurs, administer 0.2 mg IV over 15 sec. If after 45 sec no response, administer 0.2 mg again over 1 min; may repeat at 1 min intervals; not to exceed 4 doses (1 mg)   fentaNYL   (SUBLIMAZE ) injection 25-50 mcg    Make sure Narcan is available in the pyxis when using this medication. In the event of respiratory depression (RR< 8/min): Titrate NARCAN (naloxone) in increments of 0.1 to 0.2 mg IV at 2-3 minute intervals, until desired degree of reversal.   ropivacaine  (PF) 2 mg/mL (0.2%) (NAROPIN ) injection 18 mL   triamcinolone  acetonide (KENALOG -40) injection 80 mg   Medications administered: We administered lidocaine , pentafluoroprop-tetrafluoroeth, midazolam , ropivacaine  (PF) 2 mg/mL (0.2%), and triamcinolone  acetonide.  See the medical record for exact dosing, route, and time of administration.  Follow-up plan:   Return in about 2 weeks (around 04/12/2024) for (Face2F), (PPE).       Interventional Therapies  Risk Factors  Considerations  Medical Comorbidities:  Plavix  Anticoagulation: (Stop: 7-10 days  Restart: 2 hours)  CAD  T2IDDM  BA  HTN  RBBB  Anxiety/Depression  FM  MO (BMI>40)          Planned  Pending:   Diagnostic bilateral lumbar facet MBB #1  Therapeutic bilateral Qutenza treatment #1    Under consideration:   Diagnostic bilateral lumbar facet MBB #1  Therapeutic bilateral Qutenza treatment #1    Completed:   None at this time   Therapeutic  Palliative (PRN) options:   None established   Completed by other providers:   Diagnostic bilateral L3-L5 lumbar facet MBB x1 (04/07/2023) by Candi Chafe, MD [facility])  Diagnostic bilateral L5-S1 TFESI x2 (12/30/2022, 03/04/2023) by Candi Chafe, MD [facility]R)  Referral to neurology for lower extremity EMG/PNCV (diabetic peripheral neuropathy vs ischemic neuropathy) (study apparently done by Dr. Cullen Dose)  (01/09/2024) [facility] neurology)       Recent Visits Date Type Provider Dept  03/15/24 Office Visit Renaldo Caroli, MD [facility]  02/16/24 Procedure visit Renaldo Caroli, MD [facility]  01/25/24 Office Visit Renaldo Caroli, MD [facility]  Showing recent visits within past 90 days and meeting all other requirements Today's Visits Date Type Provider Dept  03/29/24 Procedure visit Renaldo Caroli, MD [facility]  Showing today's visits and meeting all other requirements Future Appointments No visits were found meeting these conditions. Showing future appointments within next 90 days and meeting all other requirements  Disposition: Discharge home  Discharge (Date  Time): 03/29/2024;   hrs.   Primary Care Physician: [facility] Location: [facility] Note by: Candi Chafe, MD (TTS technology used. I apologize for any typographical errors that were not detected and corrected.) Date: 03/29/2024; Time: 11:17 AM  Disclaimer:  Medicine is not an Visual merchandiser. The only guarantee in medicine is  that nothing is guaranteed. It is important to note that the decision to proceed with this intervention was based on the information collected from the patient. The Data and conclusions were drawn from the patient's questionnaire, the interview, and the physical examination. Because the information was provided in large part by the patient, it cannot be guaranteed that it has not been purposely or unconsciously manipulated. Every effort has been made to obtain as much relevant data as possible for this evaluation. It is important to note that the conclusions that lead to this procedure are derived in large part from the available data. Always take into account that the treatment will also be dependent on availability of resources and existing treatment guidelines, considered by other Pain Management Practitioners as being common Kristie and practice, at the time of the intervention. For Medico-Legal purposes, it is also important to point out that variation in procedural techniques and pharmacological choices are the acceptable norm. The indications, contraindications,  technique, and results of the above procedure should only be interpreted and judged by a Board-Certified Interventional Pain Specialist with extensive familiarity and expertise in the same exact procedure and technique.

## 2024-03-30 ENCOUNTER — Telehealth: Payer: Self-pay | Admitting: *Deleted

## 2024-03-30 NOTE — Telephone Encounter (Signed)
 No problems post procedure.

## 2024-04-16 ENCOUNTER — Telehealth: Payer: Self-pay

## 2024-04-16 NOTE — Telephone Encounter (Signed)
 Telephone call from patient stating that her procedure was about 2 weeks ago and she is not seeing any benefit. She states that the pain is in her lower back , groin, and under breast. Advised patient to be sure and keep her appointment on 6/11.Aaron Aas

## 2024-04-24 NOTE — Progress Notes (Unsigned)
 PROVIDER NOTE: Interpretation of information contained herein should be left to medically-trained personnel. Specific patient instructions are provided elsewhere under Patient Instructions section of medical record. This document was created in part using AI and STT-dictation technology, any transcriptional errors that may result from this process are unintentional.  Patient: Kristie Olson  Service: E/M   PCP: [facility]  DOB: 28-May-1956  DOS: 04/25/2024  Provider: Candi Chafe, MD  MRN: [id number]  Delivery: Face-to-face  Specialty: Interventional Pain Management  Type: Established Patient  Setting: Ambulatory outpatient facility  Specialty designation: 09  Referring Prov.: [facility], [facility]*  Location: Outpatient office facility       History of present illness (HPI) Ms. Kristie Olson, a 68 y.o. year old female, is here today because of her Chronic bilateral low back pain without sciatica [M54.50, G89.29]. [name]'s primary complain today is Back Pain  Pertinent problems: [name] has Cervical radiculopathy; Cervical spondylosis without myelopathy; Peripheral neuropathy; Type 2 diabetes mellitus with diabetic neuropathy (HCC); Other specified joint disorders, unspecified shoulder; PAD (peripheral artery disease) (HCC); Claudication of lower extremities (Bilateral) (HCC); Critical limb ischemia of lower extremities (Bilateral) (HCC); Chronic pain syndrome; Abnormal MRI, lumbar spine (11/20/2022); Lumbar facet arthropathy; Lumbar facet joint pain; Osteoarthritis of facet joint of lumbar spine; Osteoarthritis of lumbar spine; Spondylosis without myelopathy or radiculopathy, lumbosacral region; Atherosclerotic peripheral vascular disease (HCC); Chronic hip pain (2ry area of Pain) (Bilateral); Chronic feet pain (3ry area of Pain) (Bilateral); Burning pain; Chronic painful diabetic neuropathy (HCC); Chronic low back pain (1ry area of Pain) (Bilateral) w/o sciatica;  Lumbar facet hypertrophy (Multilevel) (Bilateral); Disorder of bursae of shoulder region; Lumbar facet joint syndrome; Low back pain of over 3 months duration; Mechanical low back pain; and Multifactorial low back pain on their pertinent problem list.  Pain Assessment: Severity of Chronic pain is reported as a 8 /10. Location: Back Lower, Right, Left/Radiates from lower back into groin. Onset: More than a month ago. Quality: Aching, Constant, Sharp, Shooting. Timing: Constant. Modifying factor(s): Tylenol  helps out. Vitals:  height is 5' (1.524 m) and weight is 207 lb (93.9 kg). Her temporal temperature is 97.3 F (36.3 C) (abnormal). Her blood pressure is 100/60 and her pulse is 84. Her respiration is 18 and oxygen saturation is 99%.  BMI: Estimated body mass index is 40.43 kg/m as calculated from the following:   Height as of this encounter: 5' (1.524 m).   Weight as of this encounter: 207 lb (93.9 kg).  Last encounter: 03/15/2024. Last procedure: 03/29/2024.  Reason for encounter: post-procedure evaluation and assessment.   Discussed the use of AI scribe software for clinical note transcription with the patient, who gave verbal consent to proceed.  History of Present Illness   Kristie Olson is a 68 year old female with diabetes who presents with worsening back pain and right foot cellulitis.  She recently received her second lumbar epidural steroid injection, which initially provided relief due to numbness in the area. However, the numbness subsided after two days, and the back pain returned, described as 'easing up pain and all of a sudden she comes back with dents'.  She has experienced a significant increase in blood sugar levels following the steroid injection, with levels reaching up to 400 mg/dL. She manages her diabetes with 70 units of insulin  in the morning and 30 units at night, and has been provided with regular insulin  for high blood sugar episodes.  She is experiencing redness  and sores  on her right leg, extending down to her foot, with swelling and loss of skin around the right foot. The swelling is severe enough to cause difficulty walking, and she describes the pain as 'allodynia', where light touch causes significant pain.  She expresses significant distress over her inability to walk without pain, stating that the pain extends from her foot up to her back and groin. She describes feeling as though 'something is eating up my body'. She wants to recover to enjoy outdoor activities, expressing a desire to 'enjoy the springtime' and 'go to the park', but feels limited by her current condition.  No allergies to antibiotics, including penicillin.     Post-Procedure Evaluation   Type: Lumbar Facet, Medial Branch Block(s)   #2  Laterality: Bilateral  Level: L2, L3, L4, L5, and S1 Medial Branch Level(s). Injecting these levels blocks the L3-4, L4-5, and L5-S1 lumbar facet joints.  Imaging: Fluoroscopic guidance Spinal ([id number]) Anesthesia: Local anesthesia (1-2% Lidocaine ) Anxiolysis: IV Versed          Sedation: Moderate Sedation                       DOS: 03/29/2024 Performed by: Candi Chafe, MD  Primary Purpose: Diagnostic/Therapeutic Indications: Low back pain severe enough to impact quality of life or function. 1. Chronic low back pain (1ry area of Pain) (Bilateral) w/o sciatica   2. Lumbar facet joint pain   3. Lumbar facet hypertrophy (Multilevel) (Bilateral)   4. Lumbar facet arthropathy   5. Osteoarthritis of facet joint of lumbar spine   6. Lumbar facet joint syndrome   7. Spondylosis without myelopathy or radiculopathy, lumbosacral region   8. Low back pain of over 3 months duration   9. Mechanical low back pain   10. Multifactorial low back pain   11. Chronic anticoagulation (Plavix )    NAS-11 Pain score:   Pre-procedure: 8 /10   Post-procedure: 0-No pain/10     Effectiveness:  Initial hour after procedure: 100 %. Subsequent 4-6  hours post-procedure: 100 %. Analgesia past initial 6 hours: 100 % x 2-3 days (2-3 days relieft and pain returned and pain overall worsen since procedure). Ongoing improvement:  Analgesic: The patient indicated having attained 100% relief of the pain for the duration of the local anesthetic which in fact continued for an additional 2 to 3 days.  Unfortunately because of her diabetes, the steroids that were used and the block updates caused her to experience hyperglycemia.  In addition since we did the procedure she has developed a recurrence of a right ankle and foot cellulitis.  The patient indicates having contacted her PCP, but having been told that there are no appointments available anytime soon.  I have provided the patient with a temporary prescription for Keflex and I have encouraged her to go to an urgent care facility to have this evaluated and perhaps the antibiotics changed.  Today I have also obtained a CBC and differential before starting those antibiotics. Function: Transient improvement ROM: Transient improvement   Pharmacotherapy Assessment   Analgesic: No chronic opioid analgesics therapy prescribed by our practice. None MME/day: 0 mg/day   Monitoring: [place] PMP: PDMP reviewed during this encounter.       Pharmacotherapy: No side-effects or adverse reactions reported. Compliance: No problems identified. Effectiveness: Clinically acceptable.  No notes on file  UDS:  Summary  Date Value Ref Range Status  12/12/2023 FINAL  Final    Comment:    ==================================================================== Compliance Drug  Analysis, Ur ==================================================================== Test                             Result       Flag       Units  Drug Present and Declared for Prescription Verification   Fluoxetine                      PRESENT      EXPECTED   Norfluoxetine                  PRESENT      EXPECTED    Norfluoxetine is an expected  metabolite of fluoxetine .    Acetaminophen                   PRESENT      EXPECTED  Drug Present not Declared for Prescription Verification   Diphenhydramine                 PRESENT      UNEXPECTED  Drug Absent but Declared for Prescription Verification   Tizanidine                      Not Detected UNEXPECTED    Tizanidine , as indicated in the declared medication list, is not    always detected even when used as directed.    Amitriptyline                   Not Detected UNEXPECTED   Salicylate                     Not Detected UNEXPECTED    Aspirin , as indicated in the declared medication list, is not always    detected even when used as directed.  ==================================================================== Test                      Result    Flag   Units      Ref Range   Creatinine              51               mg/dL      >=40 ==================================================================== Declared Medications:  The flagging and interpretation on this report are based on the  following declared medications.  Unexpected results may arise from  inaccuracies in the declared medications.   **Note: The testing scope of this panel includes these medications:   Amitriptyline  (Elavil )  Fluoxetine  (Prozac )   **Note: The testing scope of this panel does not include small to  moderate amounts of these reported medications:   Acetaminophen  (Tylenol )  Aspirin   Tizanidine  (Zanaflex )   **Note: The testing scope of this panel does not include the  following reported medications:   Albuterol  (Ventolin  HFA)  Atorvastatin  (Lipitor)  Clopidogrel  (Plavix )  Esomeprazole (Nexium)  Fluticasone  (Advair)  Glipizide  Hydrochlorothiazide  (Hyzaar)  Insulin   Losartan (Hyzaar)  Pantoprazole  (Protonix )  Salmeterol (Advair)  Triamcinolone  (Kenalog ) ==================================================================== For clinical consultation, please call [phone]. ====================================================================     No results found for: CBDTHCR No results found for: D8THCCBX No results found for: D9THCCBX  ROS  Constitutional: Denies any fever or chills Gastrointestinal: No reported hemesis, hematochezia, vomiting, or acute GI distress Musculoskeletal: Denies any acute onset joint swelling, redness, loss of ROM, or weakness Neurological: No reported episodes of acute onset apraxia, aphasia, dysarthria, agnosia, amnesia, paralysis, loss of  coordination, or loss of consciousness  Medication Review  Aspirin -Salicylamide-Caffeine, FLUoxetine , FreeStyle Libre 2 Sensor, acetaminophen , albuterol , amitriptyline , aspirin  EC, atorvastatin , cephALEXin, clopidogrel , fluticasone -salmeterol, gabapentin , glipiZIDE, insulin  NPH-regular Human, linagliptin, and lisinopril -hydrochlorothiazide   History Review  Allergy: Ms. Zillmer is allergic to ibuprofen, nsaids, and percocet [oxycodone -acetaminophen ]. Drug: Ms. Eichhorst  reports that she does not currently use drugs. Alcohol:  reports no history of alcohol use. Tobacco:  reports that she has been smoking cigarettes. She has a 46 pack-year smoking history. She has never used smokeless tobacco. Social: Ms. Bawa  reports that she has been smoking cigarettes. She has a 46 pack-year smoking history. She has never used smokeless tobacco. She reports that she does not currently use drugs. She reports that she does not drink alcohol. Medical:  has a past medical history of Asthma, Coronary artery disease, Diabetes mellitus without complication (HCC) (1990), Fibromyalgia, Hypertension, and PAD (peripheral artery disease) (HCC) (09/03/2022). Surgical: Ms. Vanderpol  has a past surgical history that includes Cesarean section; Breast mass excision (Left); Cholecystectomy (2009); Breast excisional biopsy (Left, 1980's); Lower Extremity Angiography (Left, 09/03/2022); Lower Extremity Angiography  (Right, 09/08/2022); Lower Extremity Angiography (Left, 01/18/2023); Lower Extremity Angiography (Left, 02/14/2023); and Lower Extremity Angiography (Left, 06/27/2023). Family: family history includes Breast cancer in her paternal aunt; Breast cancer (age of onset: 16) in her sister; Cancer in her father, mother, and sister; Diabetes in her mother.  Laboratory Chemistry Profile   Renal Lab Results  Component Value Date   BUN 15 12/31/2023   CREATININE 0.75 12/31/2023   GFRAA >60 07/01/2020   GFRNONAA >60 12/31/2023    Hepatic Lab Results  Component Value Date   AST 20 12/12/2023   ALT 14 12/12/2023   ALBUMIN 4.1 12/12/2023   ALKPHOS 81 12/12/2023   LIPASE 69 (H) 04/28/2020    Electrolytes Lab Results  Component Value Date   NA 138 12/31/2023   K 3.3 (L) 12/31/2023   CL 99 12/31/2023   CALCIUM  9.1 12/31/2023   MG 1.9 12/31/2023    Bone Lab Results  Component Value Date   VD25OH 13.48 (L) 12/12/2023    Inflammation (CRP: Acute Phase) (ESR: Chronic Phase) Lab Results  Component Value Date   CRP 0.5 12/12/2023   ESRSEDRATE 28 12/12/2023         Note: Above Lab results reviewed.  Recent Imaging Review  DG PAIN CLINIC C-ARM 1-60 MIN NO REPORT Fluoro was used, but no Radiologist interpretation will be provided.  Please refer to NOTES tab for provider progress note. Note: Reviewed         Physical Exam  General appearance: Well nourished, well developed, and well hydrated. In no apparent acute distress Mental status: Alert, oriented x 3 (person, place, & time)       Respiratory: No evidence of acute respiratory distress Eyes: PERLA Vitals: BP 100/60 (Patient Position: Sitting, Cuff Size: Normal)   Pulse 84   Temp (!) 97.3 F (36.3 C) (Temporal)   Resp 18   Ht 5' (1.524 m)   Wt 207 lb (93.9 kg)   SpO2 99%   BMI 40.43 kg/m  BMI: Estimated body mass index is 40.43 kg/m as calculated from the following:   Height as of this encounter: 5' (1.524 m).   Weight as of  this encounter: 207 lb (93.9 kg). Ideal: Ideal body weight: 45.5 kg (100 lb 4.9 oz) Adjusted ideal body weight: 64.9 kg (142 lb 15.8 oz)  Assessment   Diagnosis Status  1. Chronic low back pain (1ry  area of Pain) (Bilateral) w/o sciatica   2. Lumbar facet joint pain   3. Lumbar facet joint syndrome   4. Low back pain of over 3 months duration   5. Multifactorial low back pain   6. Mechanical low back pain   7. Postop check   8. Cellulitis of leg, right    Transient improvement Persistent Improved   Updated Problems: Problem  Cellulitis of Leg, Right    Plan of Care  Problem-specific:  Assessment and Plan    Cellulitis of right foot   Acute cellulitis of the right foot presents with erythema, edema, and allodynia, likely worsened by hyperglycemia from recent steroid use. She experiences difficulty ambulating and significant pain, indicating a severe infection. Prescribe antibiotics and advise urgent care evaluation and treatment. Order a CBC to assess for leukocytosis.  Allodynia of right foot   Allodynia in the right foot is likely secondary to infection and inflammation from cellulitis, with pain upon light touch indicating heightened sensitivity. Advise urgent care evaluation and treatment of cellulitis.  Type 2 diabetes mellitus with hyperglycemia   Type 2 diabetes with recent hyperglycemia, with blood glucose levels reaching up to 400 mg/dL, likely exacerbated by steroid use. Poor glycemic control increases infection risk and complicates healing. She uses regular insulin  to manage elevated glucose levels. Advise close monitoring of blood glucose levels and continue regular insulin  as needed for hyperglycemia. Emphasize glycemic management to prevent complications.  Chronic low back pain with facet arthropathy   Chronic low back pain is likely due to facet arthropathy. Previous lumbar epidural steroid injection provided temporary relief, confirming pain location.  Radiofrequency ablation is considered for long-term relief but deferred until cellulitis resolution. Ablation will be performed without steroids to avoid exacerbating hyperglycemia. Provide information on radiofrequency ablation and plan for the procedure post-cellulitis resolution.      Ms. Mickayla M Daza has a current medication list which includes the following long-term medication(s): advair diskus, albuterol , fluoxetine , glipizide, insulin  nph-regular human, tradjenta, and atorvastatin .  Pharmacotherapy (Medications Ordered): Meds ordered this encounter  Medications   cephALEXin (KEFLEX) 500 MG capsule    Sig: Take 1 capsule (500 mg total) by mouth 3 (three) times daily for 10 days. Take 1st dose 8 hrs after surgery and every 8 hrs thereafter.    Dispense:  30 capsule    Refill:  0    For implant surgical prophylaxis. Instruct to take 1st dose 8 hrs after surgery. May substitute for generic if available.   Orders:  Orders Placed This Encounter  Procedures   CBC with Differential/Platelet    [facility]c PCP: [facility]    CC Results:   PCP-NURSE [[id number]]    Release to patient:   Immediate   Sedimentation rate    Indication: Disorder of skeletal system (M89.9) Cc PCP: [facility]    CC Results:   PCP-NURSE [[id number]]    Release to patient:   Immediate   C-reactive protein    Indication: Problems influencing health status (Z78.9) Cc PCP: [facility]    CC Results:   PCP-NURSE [[id number]]    Release to patient:   Immediate   Nursing Instructions:    Please complete this patient's postprocedure evaluation.    Scheduling Instructions:     Please complete this patient's postprocedure evaluation.     Interventional Therapies  Risk Factors  Considerations  Medical Comorbidities:  Plavix  Anticoagulation: (Stop: 7-10 days  Restart: 2 hours)  CAD  T2IDDM  BA  HTN  RBBB  Anxiety/Depression  FM  MO (BMI>40)          Planned   Pending:   Therapeutic bilateral lumbar facet RFA #1 (without steroids) (once the patient's cellulitis resolves)    Under consideration:   Therapeutic bilateral lumbar facet RFA #1 (w/o STEROIDS)  Therapeutic bilateral Qutenza treatment #1    Completed:   Diagnostic bilateral lumbar facet MBB x2 (03/29/2024) (100/100/100 x 2 days/0) (hyperglycemia 2ry to steroids)   Therapeutic  Palliative (PRN) options:   None established   Completed by other providers:   Diagnostic bilateral L3-L5 lumbar facet MBB x1 (04/07/2023) by Candi Chafe, MD [facility])  Diagnostic bilateral L5-S1 TFESI x2 (12/30/2022, 03/04/2023) by Candi Chafe, MD [facility] PMR)  Referral to neurology for lower extremity EMG/PNCV (diabetic peripheral neuropathy vs ischemic neuropathy) (study apparently done by Dr. Cullen Dose)  (01/09/2024) [facility] neurology)      Return if symptoms worsen or fail to improve.    Recent Visits Date Type Provider Dept  03/29/24 Procedure visit Renaldo Caroli, MD [facility]  03/15/24 Office Visit Renaldo Caroli, MD [facility]  02/16/24 Procedure visit Renaldo Caroli, MD [facility]  Showing recent visits within past 90 days and meeting all other requirements Today's Visits Date Type Provider Dept  04/25/24 Office Visit Renaldo Caroli, MD [facility]  Showing today's visits and meeting all other requirements Future Appointments No visits were found meeting these conditions. Showing future appointments within next 90 days and meeting all other requirements  I discussed the assessment and treatment plan with the patient. The patient was provided an opportunity to ask questions and all were answered. The patient agreed with the plan and demonstrated an understanding of the instructions.  Patient advised to call back or seek an in-person evaluation if the symptoms or condition worsens.  Duration of encounter: 36 minutes.  Total time  on encounter, as per AMA guidelines included both the face-to-face and non-face-to-face time personally spent by the physician and/or other qualified health care professional(s) on the day of the encounter (includes time in activities that require the physician or other qualified health care professional and does not include time in activities normally performed by clinical staff). Physician's time may include the following activities when performed: Preparing to see the patient (e.g., pre-charting review of records, searching for previously ordered imaging, lab work, and nerve conduction tests) Review of prior analgesic pharmacotherapies. Reviewing PMP Interpreting ordered tests (e.g., lab work, imaging, nerve conduction tests) Performing post-procedure evaluations, including interpretation of diagnostic procedures Obtaining and/or reviewing separately obtained history Performing a medically appropriate examination and/or evaluation Counseling and educating the patient/family/caregiver Ordering medications, tests, or procedures Referring and communicating with other health care professionals (when not separately reported) Documenting clinical information in the electronic or other health record Independently interpreting results (not separately reported) and communicating results to the patient/ family/caregiver Care coordination (not separately reported)  Note by: Candi Chafe, MD (TTS and AI technology used. I apologize for any typographical errors that were not detected and corrected.) Date: 04/25/2024; Time: 12:31 PM

## 2024-04-25 ENCOUNTER — Ambulatory Visit: Attending: Pain Medicine | Admitting: Pain Medicine

## 2024-04-25 ENCOUNTER — Encounter: Payer: Self-pay | Admitting: Pain Medicine

## 2024-04-25 VITALS — BP 100/60 | HR 84 | Temp 97.3°F | Resp 18 | Ht 60.0 in | Wt 207.0 lb

## 2024-04-25 DIAGNOSIS — M47816 Spondylosis without myelopathy or radiculopathy, lumbar region: Secondary | ICD-10-CM | POA: Diagnosis present

## 2024-04-25 DIAGNOSIS — G8929 Other chronic pain: Secondary | ICD-10-CM | POA: Insufficient documentation

## 2024-04-25 DIAGNOSIS — M545 Low back pain, unspecified: Secondary | ICD-10-CM | POA: Diagnosis not present

## 2024-04-25 DIAGNOSIS — M5459 Other low back pain: Secondary | ICD-10-CM | POA: Diagnosis present

## 2024-04-25 DIAGNOSIS — L03115 Cellulitis of right lower limb: Secondary | ICD-10-CM | POA: Diagnosis not present

## 2024-04-25 DIAGNOSIS — Z09 Encounter for follow-up examination after completed treatment for conditions other than malignant neoplasm: Secondary | ICD-10-CM | POA: Insufficient documentation

## 2024-04-25 MED ORDER — CEPHALEXIN 500 MG PO CAPS: 500.0000 mg | ORAL_CAPSULE | Freq: Three times a day (TID) | ORAL | 0 refills | Status: AC

## 2024-04-26 LAB — CBC WITH DIFFERENTIAL/PLATELET
Basophils Absolute: 0 10*3/uL (ref 0.0–0.2)
Basos: 0 %
EOS (ABSOLUTE): 0.1 10*3/uL (ref 0.0–0.4)
Eos: 1 %
Hematocrit: 40.8 % (ref 34.0–46.6)
Hemoglobin: 13.5 g/dL (ref 11.1–15.9)
Immature Grans (Abs): 0.1 10*3/uL (ref 0.0–0.1)
Immature Granulocytes: 1 %
Lymphocytes Absolute: 3.2 10*3/uL — ABNORMAL HIGH (ref 0.7–3.1)
Lymphs: 39 %
MCH: 31.8 pg (ref 26.6–33.0)
MCHC: 33.1 g/dL (ref 31.5–35.7)
MCV: 96 fL (ref 79–97)
Monocytes Absolute: 0.5 10*3/uL (ref 0.1–0.9)
Monocytes: 7 %
Neutrophils Absolute: 4.3 10*3/uL (ref 1.4–7.0)
Neutrophils: 52 %
Platelets: 457 10*3/uL — ABNORMAL HIGH (ref 150–450)
RBC: 4.24 x10E6/uL (ref 3.77–5.28)
RDW: 13.5 % (ref 11.7–15.4)
WBC: 8.1 10*3/uL (ref 3.4–10.8)

## 2024-04-26 LAB — C-REACTIVE PROTEIN: CRP: 4 mg/L (ref 0–10)

## 2024-04-26 LAB — SEDIMENTATION RATE: Sed Rate: 52 mm/h — ABNORMAL HIGH (ref 0–40)

## 2024-04-30 ENCOUNTER — Telehealth (INDEPENDENT_AMBULATORY_CARE_PROVIDER_SITE_OTHER): Payer: Self-pay

## 2024-04-30 NOTE — Telephone Encounter (Signed)
 Patient notified with medical recommendations and will be scheduled on the nurse schedule

## 2024-04-30 NOTE — Telephone Encounter (Signed)
 Patient left a message stating she been having right le swelling with open sores that started about 2-3 weeks ago. For about a week she been having feet and ankle swelling. Patient believes that this started after having her shots in the back and then her sugar started running high. I did advised for patient to contact her PCP but she said is not able to see her provider until August. Please Advise

## 2024-04-30 NOTE — Telephone Encounter (Signed)
 She can come in for unna boots and she can keep her 6/27 visit.  Let her know that she still needs to call her PCP regarding her blood sugar levels as we don't treat that and we cannot offer advice regarding that

## 2024-05-02 ENCOUNTER — Encounter (INDEPENDENT_AMBULATORY_CARE_PROVIDER_SITE_OTHER): Payer: Self-pay

## 2024-05-02 ENCOUNTER — Ambulatory Visit (INDEPENDENT_AMBULATORY_CARE_PROVIDER_SITE_OTHER): Admitting: Nurse Practitioner

## 2024-05-02 VITALS — BP 138/81 | HR 89 | Resp 16

## 2024-05-02 DIAGNOSIS — M7989 Other specified soft tissue disorders: Secondary | ICD-10-CM | POA: Diagnosis not present

## 2024-05-02 NOTE — Progress Notes (Unsigned)
 History of Present Illness  There is no documented history at this time  Assessments & Plan   There are no diagnoses linked to this encounter.    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    Procedure:  3 layer unna wrap was placed Right lower extremity.   Plan:   Follow up in one week.

## 2024-05-03 ENCOUNTER — Encounter (INDEPENDENT_AMBULATORY_CARE_PROVIDER_SITE_OTHER): Payer: Self-pay | Admitting: Nurse Practitioner

## 2024-05-07 ENCOUNTER — Emergency Department
Admission: EM | Admit: 2024-05-07 | Discharge: 2024-05-07 | Disposition: A | Discharge status: Home / Self Care | Attending: Emergency Medicine | Admitting: Emergency Medicine

## 2024-05-07 ENCOUNTER — Other Ambulatory Visit: Payer: Self-pay

## 2024-05-07 ENCOUNTER — Encounter: Payer: Self-pay | Admitting: Emergency Medicine

## 2024-05-07 ENCOUNTER — Emergency Department

## 2024-05-07 DIAGNOSIS — E119 Type 2 diabetes mellitus without complications: Secondary | ICD-10-CM | POA: Insufficient documentation

## 2024-05-07 DIAGNOSIS — N3001 Acute cystitis with hematuria: Secondary | ICD-10-CM | POA: Diagnosis not present

## 2024-05-07 DIAGNOSIS — I2510 Atherosclerotic heart disease of native coronary artery without angina pectoris: Secondary | ICD-10-CM | POA: Insufficient documentation

## 2024-05-07 DIAGNOSIS — R300 Dysuria: Secondary | ICD-10-CM | POA: Diagnosis present

## 2024-05-07 DIAGNOSIS — F17200 Nicotine dependence, unspecified, uncomplicated: Secondary | ICD-10-CM | POA: Diagnosis not present

## 2024-05-07 DIAGNOSIS — I10 Essential (primary) hypertension: Secondary | ICD-10-CM | POA: Diagnosis not present

## 2024-05-07 LAB — URINALYSIS, ROUTINE W REFLEX MICROSCOPIC
Bilirubin Urine: NEGATIVE
Glucose, UA: 50 mg/dL — AB
Ketones, ur: NEGATIVE mg/dL
Nitrite: NEGATIVE
Protein, ur: NEGATIVE mg/dL
Specific Gravity, Urine: 1.008 (ref 1.005–1.030)
Squamous Epithelial / HPF: 0 /HPF (ref 0–5)
pH: 6 (ref 5.0–8.0)

## 2024-05-07 LAB — CBC WITH DIFFERENTIAL/PLATELET
Abs Immature Granulocytes: 0.05 10*3/uL (ref 0.00–0.07)
Basophils Absolute: 0 10*3/uL (ref 0.0–0.1)
Basophils Relative: 0 %
Eosinophils Absolute: 0 10*3/uL (ref 0.0–0.5)
Eosinophils Relative: 0 %
HCT: 42.1 % (ref 36.0–46.0)
Hemoglobin: 13.9 g/dL (ref 12.0–15.0)
Immature Granulocytes: 1 %
Lymphocytes Relative: 38 %
Lymphs Abs: 3.5 10*3/uL (ref 0.7–4.0)
MCH: 31.2 pg (ref 26.0–34.0)
MCHC: 33 g/dL (ref 30.0–36.0)
MCV: 94.4 fL (ref 80.0–100.0)
Monocytes Absolute: 0.7 10*3/uL (ref 0.1–1.0)
Monocytes Relative: 7 %
Neutro Abs: 4.8 10*3/uL (ref 1.7–7.7)
Neutrophils Relative %: 54 %
Platelets: 464 10*3/uL — ABNORMAL HIGH (ref 150–400)
RBC: 4.46 MIL/uL (ref 3.87–5.11)
RDW: 13.9 % (ref 11.5–15.5)
WBC: 9.1 10*3/uL (ref 4.0–10.5)
nRBC: 0 % (ref 0.0–0.2)

## 2024-05-07 LAB — BASIC METABOLIC PANEL WITH GFR
Anion gap: 11 (ref 5–15)
BUN: 11 mg/dL (ref 8–23)
CO2: 26 mmol/L (ref 22–32)
Calcium: 10 mg/dL (ref 8.9–10.3)
Chloride: 100 mmol/L (ref 98–111)
Creatinine, Ser: 0.8 mg/dL (ref 0.44–1.00)
GFR, Estimated: >60 mL/min (ref >60)
Glucose, Bld: 167 mg/dL — ABNORMAL HIGH (ref 70–99)
Potassium: 4 mmol/L (ref 3.5–5.1)
Sodium: 137 mmol/L (ref 135–145)

## 2024-05-07 MED ORDER — LACTATED RINGERS IV BOLUS
1000.0000 mL | Freq: Once | INTRAVENOUS | Status: AC
  Administered 2024-05-07: 1000 mL via INTRAVENOUS

## 2024-05-07 MED ORDER — CEPHALEXIN 500 MG PO CAPS: 500.0000 mg | ORAL_CAPSULE | Freq: Three times a day (TID) | ORAL | 0 refills | Status: AC

## 2024-05-07 MED ORDER — MORPHINE SULFATE (PF) 4 MG/ML IV SOLN
4.0000 mg | Freq: Once | INTRAVENOUS | Status: AC
  Administered 2024-05-07: 4 mg via INTRAVENOUS
  Filled 2024-05-07: qty 1

## 2024-05-07 MED ORDER — PHENAZOPYRIDINE HCL 100 MG PO TABS
95.0000 mg | ORAL_TABLET | Freq: Once | ORAL | Status: AC
  Administered 2024-05-07: 100 mg via ORAL
  Filled 2024-05-07: qty 1

## 2024-05-07 MED ORDER — ACETAMINOPHEN 500 MG PO TABS
1000.0000 mg | ORAL_TABLET | Freq: Once | ORAL | Status: AC
  Administered 2024-05-07: 1000 mg via ORAL
  Filled 2024-05-07: qty 2

## 2024-05-07 MED ORDER — IOHEXOL 300 MG/ML  SOLN
100.0000 mL | Freq: Once | INTRAMUSCULAR | Status: AC | PRN
Start: 2024-05-07 — End: 2024-05-07
  Administered 2024-05-07: 100 mL via INTRAVENOUS

## 2024-05-07 MED ORDER — SODIUM CHLORIDE 0.9 % IV SOLN
1.0000 g | Freq: Once | INTRAVENOUS | Status: AC
  Administered 2024-05-07: 1 g via INTRAVENOUS
  Filled 2024-05-07: qty 10

## 2024-05-07 MED ORDER — OXYCODONE HCL 5 MG PO TABS: 5.0000 mg | ORAL_TABLET | Freq: Three times a day (TID) | ORAL | 0 refills | Status: AC | PRN

## 2024-05-07 MED ORDER — OXYCODONE HCL 5 MG PO TABS
5.0000 mg | ORAL_TABLET | Freq: Once | ORAL | Status: AC
  Administered 2024-05-07: 5 mg via ORAL
  Filled 2024-05-07: qty 1

## 2024-05-07 NOTE — Discharge Instructions (Addendum)
 As we discussed, signs of UTI/urine infection.  You are being discharged with Keflex  antibiotic to take 3 times daily for 5 more days  Oxycodone  as needed for more severe/breakthrough pain or to help with sleep  Use Tylenol  for pain and fevers.  Up to 1000 mg per dose, up to 4 times per day.  Do not take more than 4000 mg of Tylenol /acetaminophen  within 24 hours.SABRA

## 2024-05-07 NOTE — ED Provider Notes (Addendum)
 [facility] Provider Note    Event Date/Time   First MD Initiated Contact with Patient 05/07/24 1642     (approximate)   History   Vaginal Bleeding   HPI  Kristie Olson is a 68 y.o. female who presents to the ED for evaluation of Vaginal Bleeding   Review DC summary from medical admission in February for influenza, asthma and hypoxia.  History of DM, CAD and PAD, HTN, tobacco abuse.  Plavix  without anticoagulation.  Patient presents for evaluation of 12-14 hours of dysuria, hematuria and pelvic pressure.  Notably, she was triaged as having vaginal bleeding but she adamantly denies this and says that she is confident it is urinary.  Furthermore, with triage nursing staff for the patient to gown, remove underwear with a pad that was clean without blood.  Reports passing 2 small clots in her urine and then urine subsequently cleared up with regards to blood, but dysuria and pelvic pressure remains.   Physical Exam   Triage Vital Signs: ED Triage Vitals  Encounter Vitals Group     BP 05/07/24 1640 (!) 118/57     Girls Systolic BP Percentile --      Girls Diastolic BP Percentile --      Boys Systolic BP Percentile --      Boys Diastolic BP Percentile --      Pulse Rate 05/07/24 1640 95     Resp 05/07/24 1641 17     Temp 05/07/24 1640 98.4 F (36.9 C)     Temp Source 05/07/24 1640 Oral     SpO2 05/07/24 1640 97 %     Weight --      Height --      Head Circumference --      Peak Flow --      Pain Score --      Pain Loc --      Pain Education --      Exclude from Growth Chart --     Most recent vital signs: Vitals:   05/07/24 1641 05/07/24 1641  BP:    Pulse: 97   Resp:  17  Temp:    SpO2: 97%     General: Awake, no distress.  CV:  Good peripheral perfusion.  Resp:  Normal effort.  Abd:  No distention.  Suprapubic tenderness is present on palpation, benign upper abdomen.  No peritoneal features. MSK:  No deformity noted.   Neuro:  No focal deficits appreciated. Other:  Chaperoned with RN assistance, external GU exam with normal-appearing external genitalia without signs of bleeding or prolapse.   ED Results / Procedures / Treatments   Labs (all labs ordered are listed, but only abnormal results are displayed) Labs Reviewed  URINALYSIS, ROUTINE W REFLEX MICROSCOPIC - Abnormal; Notable for the following components:      Result Value   Color, Urine STRAW (*)    APPearance CLEAR (*)    Glucose, UA 50 (*)    Hgb urine dipstick MODERATE (*)    Leukocytes,Ua SMALL (*)    Bacteria, UA RARE (*)    All other components within normal limits  CBC WITH DIFFERENTIAL/PLATELET - Abnormal; Notable for the following components:   Platelets 464 (*)    All other components within normal limits  BASIC METABOLIC PANEL WITH GFR - Abnormal; Notable for the following components:   Glucose, Bld 167 (*)    All other components within normal limits  URINE CULTURE    EKG  RADIOLOGY CT abdomen/pelvis interpreted by me without evidence of acute pathology  Official radiology report(s): CT ABDOMEN PELVIS W CONTRAST Result Date: 05/07/2024 CLINICAL DATA:  Increased urinary frequency and incontinence with hematuria EXAM: CT ABDOMEN AND PELVIS WITH CONTRAST TECHNIQUE: Multidetector CT imaging of the abdomen and pelvis was performed using the standard protocol following bolus administration of intravenous contrast. RADIATION DOSE REDUCTION: This exam was performed according to the departmental dose-optimization program which includes automated exposure control, adjustment of the mA and/or kV according to patient size and/or use of iterative reconstruction technique. CONTRAST:  OMNIPAQUE  IOHEXOL  300 MG/ML  SOLN COMPARISON:  CT abdomen and pelvis dated 05/22/2020 FINDINGS: Lower chest: No focal consolidation or pulmonary nodule in the lung bases. No pleural effusion or pneumothorax demonstrated. Partially imaged heart size is  normal. Coronary artery calcifications. Hepatobiliary: No focal hepatic lesions. No intra or extrahepatic biliary ductal dilation. Cholecystectomy. Pancreas: No focal lesions or main ductal dilation. Spleen: Normal in size without focal abnormality. Adrenals/Urinary Tract: Unchanged bilateral adrenals measuring 12 mm on the right and 18 mm and 11 mm on the left, likely adenomas. No specific follow-up imaging recommended. Right upper renal cortical scarring. No suspicious renal mass, calculi or hydronephrosis. No focal bladder wall thickening. Stomach/Bowel: Normal appearance of the stomach. Moderate gas containing duodenal diverticulum arising from the third portion of the duodenum. No evidence of bowel wall thickening, distention, or inflammatory changes. Ascending colonic diverticulosis without acute diverticulitis. Normal appendix. Vascular/Lymphatic: Aortic atherosclerosis. Bilateral common femoral artery stents. Patency is suboptimally evaluated due to narrow vessel caliber. No enlarged abdominal or pelvic lymph nodes. Reproductive: Bilateral adnexal surgical clips.  No adnexal masses. Other: No free fluid, fluid collection, or free air. Musculoskeletal: No acute or abnormal lytic or blastic osseous lesions. Anterior lower abdominal subcutaneous density, likely injection related. IMPRESSION: 1. No acute abdominopelvic findings. No focal bladder wall thickening or intraluminal filling defects. 2.  Aortic Atherosclerosis (ICD10-I70.0). Electronically Signed   By: Limin  Xu M.D.   On: 05/07/2024 19:01    PROCEDURES and INTERVENTIONS:  Procedures  Medications  phenazopyridine (PYRIDIUM) tablet 100 mg (has no administration in time range)  acetaminophen  (TYLENOL ) tablet 1,000 mg (has no administration in time range)  oxyCODONE  (Oxy IR/ROXICODONE ) immediate release tablet 5 mg (has no administration in time range)  lactated ringers  bolus 1,000 mL (1,000 mLs Intravenous New Bag/Given 05/07/24 1700)   morphine  (PF) 4 MG/ML injection 4 mg (4 mg Intravenous Given 05/07/24 1659)  cefTRIAXone (ROCEPHIN) 1 g in sodium chloride  0.9 % 100 mL IVPB (0 g Intravenous Stopped 05/07/24 1748)  iohexol  (OMNIPAQUE ) 300 MG/ML solution 100 mL (100 mLs Intravenous Contrast Given 05/07/24 1811)     IMPRESSION / MDM / ASSESSMENT AND PLAN / ED COURSE  I reviewed the triage vital signs and the nursing notes.  Differential diagnosis includes, but is not limited to, vaginal prolapse, nephritis or sepsis, UTI, endometrial malignancy  {Patient presents with symptoms of an acute illness or injury that is potentially life-threatening.  Patient presents with hematuria, dysuria and pelvic pressure with signs of cystitis suitable for outpatient management.  Borderline elevated heart rate but otherwise reassuring vital signs.  No leukocytosis or metabolic derangements.  No further signs of sepsis.  UA sent for culture and she is started on antibiotics.  CT is reassuring.  I considered admission but believe she be suitable for outpatient management with her husband.  No urinary retention or clot retention noted with postvoid residual with bedside bladder scanner.  Clinical Course as  of 05/07/24 2008  Mon May 07, 2024  1918 Reassessed. Up using the bathroom and feeling better [DS]  2005 Reassessed.  Husband at the bedside.  Discussed CT, workup overall plan of care.  She is appreciative.  Ready to go [DS]    Clinical Course User Index [DS] Claudene Rover, MD     FINAL CLINICAL IMPRESSION(S) / ED DIAGNOSES   Final diagnoses:  Acute cystitis with hematuria     Rx / DC Orders   ED Discharge Orders          Ordered    oxyCODONE  (ROXICODONE ) 5 MG immediate release tablet  Every 8 hours PRN        05/07/24 2007    cephALEXin  (KEFLEX ) 500 MG capsule  3 times daily        05/07/24 2007             Note:  This document was prepared using Dragon voice recognition software and may include unintentional dictation  errors.   Claudene Rover, MD 05/07/24 2009    Claudene Rover, MD 05/07/24 2009

## 2024-05-07 NOTE — ED Triage Notes (Addendum)
 BIB EMS from home.  Increased in urinary frequency and incontinence.  Has been passing  a lot of blood and clots since 3a this morning.  Pt reports pain in her vaginal area as well.  BP 100/70, HR 102.  CBG 82.  Reports hx of diabetes and recently sugars have been high at home.

## 2024-05-08 LAB — URINE CULTURE: Culture: <10000 — AB

## 2024-05-10 ENCOUNTER — Other Ambulatory Visit (INDEPENDENT_AMBULATORY_CARE_PROVIDER_SITE_OTHER): Payer: Self-pay | Admitting: Nurse Practitioner

## 2024-05-10 DIAGNOSIS — I739 Peripheral vascular disease, unspecified: Secondary | ICD-10-CM

## 2024-05-11 ENCOUNTER — Ambulatory Visit (INDEPENDENT_AMBULATORY_CARE_PROVIDER_SITE_OTHER): Payer: 59 | Admitting: Nurse Practitioner

## 2024-05-11 ENCOUNTER — Emergency Department: Admission: EM | Admit: 2024-05-11 | Discharge: 2024-05-11 | Discharge status: Against Medical Advice

## 2024-05-11 ENCOUNTER — Ambulatory Visit (INDEPENDENT_AMBULATORY_CARE_PROVIDER_SITE_OTHER): Payer: 59

## 2024-05-11 VITALS — BP 138/79 | HR 85

## 2024-05-11 DIAGNOSIS — M7989 Other specified soft tissue disorders: Secondary | ICD-10-CM | POA: Diagnosis not present

## 2024-05-11 DIAGNOSIS — I739 Peripheral vascular disease, unspecified: Secondary | ICD-10-CM | POA: Diagnosis not present

## 2024-05-11 DIAGNOSIS — F17200 Nicotine dependence, unspecified, uncomplicated: Secondary | ICD-10-CM | POA: Diagnosis not present

## 2024-05-11 DIAGNOSIS — Z9889 Other specified postprocedural states: Secondary | ICD-10-CM

## 2024-05-13 ENCOUNTER — Encounter (INDEPENDENT_AMBULATORY_CARE_PROVIDER_SITE_OTHER): Payer: Self-pay | Admitting: Nurse Practitioner

## 2024-05-13 NOTE — Progress Notes (Signed)
 Subjective:    Patient ID: Kristie Olson, female    DOB: September 13, 1956, 68 y.o.   MRN: [id number] Chief Complaint  Patient presents with   Follow-up     - fu 6 months + ABI see JD or FB    The patient returns to the office for followup regarding atherosclerotic changes of the lower extremities and review of the noninvasive studies.   There have been no interval changes in lower extremity symptoms. No interval shortening of the patient's claudication distance or development of rest pain symptoms.   She had swelling and wounds, on the bilateral lower extremities.  The swelling is much improved but she still has some small wounds on her right lower extremity.  These reminiscent of wounds that she had several years ago.  Based upon her description these were treated using Xeroform and wraps.  There have been no significant changes to the patient's overall health care.  The patient denies amaurosis fugax or recent TIA symptoms. There are no documented recent neurological changes noted. There is no history of DVT, PE or superficial thrombophlebitis. The patient denies recent episodes of angina or shortness of breath.   ABI Rt=0.98 and Lt=0.90  (previous ABI's Rt=0.95 and Lt=0.90) Duplex ultrasound of the patient has triphasic tibial artery waveforms bilaterally    Review of Systems  Cardiovascular:  Positive for leg swelling.  Skin:  Positive for wound.  All other systems reviewed and are negative.      Objective:   Physical Exam Vitals reviewed.  HENT:     Head: Normocephalic.   Cardiovascular:     Rate and Rhythm: Normal rate.     Pulses:          Dorsalis pedis pulses are detected w/ Doppler on the right side and detected w/ Doppler on the left side.       Posterior tibial pulses are detected w/ Doppler on the right side and detected w/ Doppler on the left side.  Pulmonary:     Effort: Pulmonary effort is normal.   Skin:    General: Skin is warm and dry.    Neurological:     Mental Status: She is alert and oriented to person, place, and time.   Psychiatric:        Mood and Affect: Mood normal.        Behavior: Behavior normal.        Thought Content: Thought content normal.        Judgment: Judgment normal.     BP 138/79   Pulse 85   Past Medical History:  Diagnosis Date   Asthma    Coronary artery disease    Diabetes mellitus without complication (HCC) 1990   Fibromyalgia    Hypertension    PAD (peripheral artery disease) (HCC) 09/03/2022    Social History   Socioeconomic History   Marital status: Married    Spouse name: Not on file   Number of children: Not on file   Years of education: Not on file   Highest education level: Not on file  Occupational History   Not on file  Tobacco Use   Smoking status: Every Day    Current packs/day: 1.00    Average packs/day: 1 pack/day for 46.0 years (46.0 ttl pk-yrs)    Types: Cigarettes   Smokeless tobacco: Never   Tobacco comments:    Since patient was 68 years old  Vaping Use   Vaping status: Never Used  Substance and Sexual  Activity   Alcohol use: No   Drug use: Not Currently   Sexual activity: Not on file  Other Topics Concern   Not on file  Social History Narrative   Lives with husband, Ruebin and son, Fonda. No pets.   Social Drivers of Health   Financial Resource Strain: Medium Risk (09/16/2020)   Received from [facility]   Overall Financial Resource Strain (CARDIA)    Difficulty of Paying Living Expenses: Somewhat hard  Food Insecurity: No Food Insecurity (12/30/2023)   Hunger Vital Sign    Worried About Running Out of Food in the Last Year: Never true    Ran Out of Food in the Last Year: Never true  Transportation Needs: No Transportation Needs (12/30/2023)   PRAPARE - Administrator, Civil Service (Medical): No    Lack of Transportation (Non-Medical): No  Physical Activity: Not on file  Stress: Not on file  Social Connections:  Moderately Integrated (12/30/2023)   Social Connection and Isolation Panel    Frequency of Communication with Friends and Family: More than three times a week    Frequency of Social Gatherings with Friends and Family: Twice a week    Attends Religious Services: More than 4 times per year    Active Member of [employer] or Organizations: No    Attends Banker Meetings: Never    Marital Status: Married  Catering manager Violence: Not At Risk (12/30/2023)   Humiliation, Afraid, Rape, and Kick questionnaire    Fear of Current or Ex-Partner: No    Emotionally Abused: No    Physically Abused: No    Sexually Abused: No    Past Surgical History:  Procedure Laterality Date   BREAST EXCISIONAL BIOPSY Left 1980's   neg   BREAST MASS EXCISION Left    age 77s, benign   CESAREAN SECTION     CHOLECYSTECTOMY  2009   LOWER EXTREMITY ANGIOGRAPHY Left 09/03/2022   Procedure: Lower Extremity Angiography;  Surgeon: Marea Selinda RAMAN, MD;  Location: [facility];  Service: Cardiovascular;  Laterality: Left;   LOWER EXTREMITY ANGIOGRAPHY Right 09/08/2022   Procedure: Lower Extremity Angiography;  Surgeon: Marea Selinda RAMAN, MD;  Location: [facility];  Service: Cardiovascular;  Laterality: Right;   LOWER EXTREMITY ANGIOGRAPHY Left 01/18/2023   Procedure: Lower Extremity Angiography;  Surgeon: Jama Cordella MATSU, MD;  Location: [facility];  Service: Cardiovascular;  Laterality: Left;   LOWER EXTREMITY ANGIOGRAPHY Left 02/14/2023   Procedure: Lower Extremity Angiography;  Surgeon: Marea Selinda RAMAN, MD;  Location: [facility];  Service: Cardiovascular;  Laterality: Left;   LOWER EXTREMITY ANGIOGRAPHY Left 06/27/2023   Procedure: Lower Extremity Angiography;  Surgeon: Marea Selinda RAMAN, MD;  Location: [facility];  Service: Cardiovascular;  Laterality: Left;    Family History  Problem Relation Age of Onset   Cancer Mother    Diabetes Mother    Cancer Father    Cancer Sister     Breast cancer Sister 51   Breast cancer Paternal Aunt     Allergies  Allergen Reactions   Ibuprofen Other (See Comments)    STOMACHACHE   Nsaids     Other reaction(s): Other (See Comments) PUD   Percocet [Oxycodone -Acetaminophen ] Itching    Patient states Percocet makes me itch all over       Latest Ref Rng & Units 05/07/2024    4:39 PM 04/25/2024   10:37 AM 12/31/2023    6:46  AM  CBC  WBC 4.0 - 10.5 K/uL 9.1  8.1  7.6   Hemoglobin 12.0 - 15.0 g/dL 86.0  86.4  86.8   Hematocrit 36.0 - 46.0 % 42.1  40.8  38.1   Platelets 150 - 400 K/uL 464  457  393       CMP     Component Value Date/Time   NA 137 05/07/2024 1639   NA 132 (L) 07/14/2013 2143   K 4.0 05/07/2024 1639   K 4.3 07/14/2013 2143   CL 100 05/07/2024 1639   CL 95 (L) 07/14/2013 2143   CO2 26 05/07/2024 1639   CO2 31 07/14/2013 2143   GLUCOSE 167 (H) 05/07/2024 1639   GLUCOSE 433 (H) 07/14/2013 2143   BUN 11 05/07/2024 1639   BUN 12 07/14/2013 2143   CREATININE 0.80 05/07/2024 1639   CREATININE 0.91 07/14/2013 2143   CALCIUM  10.0 05/07/2024 1639   CALCIUM  9.3 07/14/2013 2143   PROT 7.8 12/12/2023 1418   PROT 7.9 07/14/2013 2143   ALBUMIN 4.1 12/12/2023 1418   ALBUMIN 3.7 07/14/2013 2143   AST 20 12/12/2023 1418   AST 16 07/14/2013 2143   ALT 14 12/12/2023 1418   ALT 20 07/14/2013 2143   ALKPHOS 81 12/12/2023 1418   ALKPHOS 118 07/14/2013 2143   BILITOT 0.3 12/12/2023 1418   BILITOT 0.3 07/14/2013 2143   GFRNONAA >60 05/07/2024 1639   GFRNONAA >60 07/14/2013 2143     No results found.     Assessment & Plan:   1. Peripheral arterial disease with history of revascularization (HCC) (Primary)  Recommend:  The patient has evidence of atherosclerosis of the lower extremities with claudication.  The patient does not voice lifestyle limiting changes at this point in time.  Noninvasive studies do not suggest clinically significant change.  No invasive studies, angiography or surgery at this  time The patient should continue walking and begin a more formal exercise program.  The patient should continue antiplatelet therapy and aggressive treatment of the lipid abnormalities  No changes in the patient's medications at this time  Continued surveillance is indicated as atherosclerosis is likely to progress with time.    The patient will continue follow up with noninvasive studies as ordered.   2. Tobacco use disorder Smoking cessation was discussed, 3-10 minutes spent on this topic specifically  3. Leg swelling The patient currently does have leg swelling which is improving but she also has some small wounds.  She should have adequate ability to heal these based on her noninvasive studies.  Will continue with Unna boots but this will improve her wounds.  However if they worsen or improve does not heal we may consider wound care referral.   Current Outpatient Medications on File Prior to Visit  Medication Sig Dispense Refill   acetaminophen  (TYLENOL ) 325 MG tablet Take 2 tablets (650 mg total) by mouth every 6 (six) hours as needed for mild pain (or Fever >/= 101).     albuterol  (PROVENTIL  HFA;VENTOLIN  HFA) 108 (90 Base) MCG/ACT inhaler Inhale 2 puffs into the lungs every 4 (four) hours as needed for wheezing or shortness of breath.     amitriptyline  (ELAVIL ) 100 MG tablet Take 10 mg by mouth at bedtime as needed for sleep.     aspirin  EC 81 MG tablet Take 1 tablet (81 mg total) by mouth daily. Swallow whole. 30 tablet 0   Aspirin -Salicylamide-Caffeine (BC HEADACHE POWDER PO) Take by mouth.     atorvastatin  (LIPITOR) 10  MG tablet Take 1 tablet (10 mg total) by mouth daily. 30 tablet 0   clopidogrel  (PLAVIX ) 75 MG tablet Take 1 tablet by mouth once daily 90 tablet 1   Continuous Blood Gluc Sensor (FREESTYLE LIBRE 2 SENSOR) MISC USE AS DIRECTED EVERY 2 WEEKS     FLUoxetine  (PROZAC ) 40 MG capsule Take 1 capsule by mouth daily.     gabapentin  (NEURONTIN ) 400 MG capsule Take 400 mg by  mouth 3 (three) times daily.     insulin  NPH-regular Human (70-30) 100 UNIT/ML injection Inject 25-70 Units into the skin in the morning and at bedtime. 70 qam 25 qhs     lisinopril -hydrochlorothiazide  (ZESTORETIC ) 20-25 MG tablet Take 1 tablet by mouth daily.     oxyCODONE  (ROXICODONE ) 5 MG immediate release tablet Take 1 tablet (5 mg total) by mouth every 8 (eight) hours as needed. 7 tablet 0   TRADJENTA 5 MG TABS tablet Take 5 mg by mouth daily.     ADVAIR DISKUS 250-50 MCG/ACT AEPB Inhale 1 puff into the lungs in the morning and at bedtime. (Patient not taking: Reported on 05/11/2024)     glipiZIDE (GLUCOTROL) 10 MG tablet Take 10 mg by mouth daily as needed. (Patient not taking: Reported on 05/11/2024)     No current facility-administered medications on file prior to visit.    There are no Patient Instructions on file for this visit. No follow-ups on file.   Mitzy Naron E Levina Boyack, NP

## 2024-05-14 LAB — VAS US ABI WITH/WO TBI
Left ABI: 0.9
Right ABI: 0.98

## 2024-05-17 ENCOUNTER — Ambulatory Visit (INDEPENDENT_AMBULATORY_CARE_PROVIDER_SITE_OTHER): Admitting: Nurse Practitioner

## 2024-05-17 ENCOUNTER — Encounter (INDEPENDENT_AMBULATORY_CARE_PROVIDER_SITE_OTHER): Payer: Self-pay

## 2024-05-17 VITALS — BP 149/72 | HR 82 | Resp 18

## 2024-05-17 DIAGNOSIS — M7989 Other specified soft tissue disorders: Secondary | ICD-10-CM | POA: Diagnosis not present

## 2024-05-17 NOTE — Progress Notes (Signed)
 History of Present Illness  There is no documented history at this time  Assessments & Plan   There are no diagnoses linked to this encounter.    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    Procedure:  3 layer unna wrap was placed Right lower extremity.   Plan:   Follow up in one week.

## 2024-05-24 ENCOUNTER — Encounter (INDEPENDENT_AMBULATORY_CARE_PROVIDER_SITE_OTHER): Payer: Self-pay | Admitting: Nurse Practitioner

## 2024-05-24 ENCOUNTER — Ambulatory Visit (INDEPENDENT_AMBULATORY_CARE_PROVIDER_SITE_OTHER): Admitting: Nurse Practitioner

## 2024-05-24 VITALS — BP 138/80 | HR 83 | Resp 16

## 2024-05-24 DIAGNOSIS — M7989 Other specified soft tissue disorders: Secondary | ICD-10-CM | POA: Diagnosis not present

## 2024-05-24 NOTE — Progress Notes (Signed)
 History of Present Illness  There is no documented history at this time  Assessments & Plan   There are no diagnoses linked to this encounter.    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    Procedure:  3 layer unna wrap was placed Right lower extremity.   Plan:   Follow up in one week.

## 2024-05-31 ENCOUNTER — Ambulatory Visit (INDEPENDENT_AMBULATORY_CARE_PROVIDER_SITE_OTHER): Admitting: Nurse Practitioner

## 2024-05-31 ENCOUNTER — Encounter (INDEPENDENT_AMBULATORY_CARE_PROVIDER_SITE_OTHER): Payer: Self-pay | Admitting: Nurse Practitioner

## 2024-05-31 VITALS — BP 136/79 | HR 80 | Resp 16

## 2024-05-31 DIAGNOSIS — M7989 Other specified soft tissue disorders: Secondary | ICD-10-CM | POA: Diagnosis not present

## 2024-05-31 NOTE — Progress Notes (Unsigned)
 History of Present Illness  There is no documented history at this time  Assessments & Plan   There are no diagnoses linked to this encounter.    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    Procedure:  3 layer unna wrap was placed Right lower extremity.   Plan:   Follow up in one week.

## 2024-06-01 ENCOUNTER — Encounter (INDEPENDENT_AMBULATORY_CARE_PROVIDER_SITE_OTHER): Payer: Self-pay | Admitting: Nurse Practitioner

## 2024-06-03 NOTE — Progress Notes (Unsigned)
 PROVIDER NOTE: Interpretation of information contained herein should be left to medically-trained personnel. Specific patient instructions are provided elsewhere under Patient Instructions section of medical record. This document was created in part using AI and STT-dictation technology, any transcriptional errors that may result from this process are unintentional.  Patient: Kristie Olson  Service: E/M   PCP: [facility]  DOB: Sep 23, 1956  DOS: 06/04/2024  Provider: Eric DELENA Como, MD  MRN: [id number]  Delivery: Face-to-face  Specialty: Interventional Pain Management  Type: Established Patient  Setting: Ambulatory outpatient facility  Specialty designation: 09  Referring Prov.: [facility], [facility]*  Location: Outpatient office facility       History of present illness (HPI) Kristie Olson, a 68 y.o. year old female, is here today because of her Chronic bilateral low back pain without sciatica [M54.50, G89.29]. Kristie Olson's primary complain today is No chief complaint on file.  Pertinent problems: Kristie Olson has Cervical radiculopathy; Cervical spondylosis without myelopathy; Peripheral neuropathy; Type 2 diabetes mellitus with diabetic neuropathy (HCC); Other specified joint disorders, unspecified shoulder; PAD (peripheral artery disease) (HCC); Claudication of lower extremities (Bilateral) (HCC); Critical limb ischemia of lower extremities (Bilateral) (HCC); Chronic pain syndrome; Abnormal MRI, lumbar spine (11/20/2022); Lumbar facet arthropathy; Lumbar facet joint pain; Osteoarthritis of facet joint of lumbar spine; Osteoarthritis of lumbar spine; Spondylosis without myelopathy or radiculopathy, lumbosacral region; Atherosclerotic peripheral vascular disease (HCC); Chronic hip pain (2ry area of Pain) (Bilateral); Chronic feet pain (3ry area of Pain) (Bilateral); Burning pain; Chronic painful diabetic neuropathy (HCC); Chronic low back pain (1ry area of Pain)  (Bilateral) w/o sciatica; Lumbar facet hypertrophy (Multilevel) (Bilateral); Disorder of bursae of shoulder region; Lumbar facet joint syndrome; Low back pain of over 3 months duration; Mechanical low back pain; and Multifactorial low back pain on their pertinent problem list.  Pain Assessment: Severity of   is reported as a  /10. Location:    / . Onset:  . Quality:  . Timing:  . Modifying factor(s):  SABRA Vitals:  vitals were not taken for this visit.  BMI: Estimated body mass index is 40.43 kg/m as calculated from the following:   Height as of 04/25/24: 5' (1.524 m).   Weight as of 04/25/24: 207 lb (93.9 kg).  Last encounter: 04/25/2024. Last procedure: 03/29/2024.  Reason for encounter: evaluation of worsening, or previously known (established) problem.   Discussed the use of AI scribe software for clinical note transcription with the patient, who gave verbal consent to proceed.  History of Present Illness           Pharmacotherapy Assessment   Analgesic: No chronic opioid analgesics therapy prescribed by our practice. None MME/day: 0 mg/day   Monitoring: [place] PMP: PDMP reviewed during this encounter.       Pharmacotherapy: No side-effects or adverse reactions reported. Compliance: No problems identified. Effectiveness: Clinically acceptable.  No notes on file  UDS:  Summary  Date Value Ref Range Status  12/12/2023 FINAL  Final    Comment:    ==================================================================== Compliance Drug Analysis, Ur ==================================================================== Test                             Result       Flag       Units  Drug Present and Declared for Prescription Verification   Fluoxetine   PRESENT      EXPECTED   Norfluoxetine                  PRESENT      EXPECTED    Norfluoxetine is an expected metabolite of fluoxetine .    Acetaminophen                   PRESENT      EXPECTED  Drug Present not Declared  for Prescription Verification   Diphenhydramine                 PRESENT      UNEXPECTED  Drug Absent but Declared for Prescription Verification   Tizanidine                      Not Detected UNEXPECTED    Tizanidine , as indicated in the declared medication list, is not    always detected even when used as directed.    Amitriptyline                   Not Detected UNEXPECTED   Salicylate                     Not Detected UNEXPECTED    Aspirin , as indicated in the declared medication list, is not always    detected even when used as directed.  ==================================================================== Test                      Result    Flag   Units      Ref Range   Creatinine              51               mg/dL      >=79 ==================================================================== Declared Medications:  The flagging and interpretation on this report are based on the  following declared medications.  Unexpected results may arise from  inaccuracies in the declared medications.   **Note: The testing scope of this panel includes these medications:   Amitriptyline  (Elavil )  Fluoxetine  (Prozac )   **Note: The testing scope of this panel does not include small to  moderate amounts of these reported medications:   Acetaminophen  (Tylenol )  Aspirin   Tizanidine  (Zanaflex )   **Note: The testing scope of this panel does not include the  following reported medications:   Albuterol  (Ventolin  HFA)  Atorvastatin  (Lipitor)  Clopidogrel  (Plavix )  Esomeprazole (Nexium)  Fluticasone  (Advair)  Glipizide  Hydrochlorothiazide  (Hyzaar)  Insulin   Losartan (Hyzaar)  Pantoprazole  (Protonix )  Salmeterol (Advair)  Triamcinolone  (Kenalog ) ==================================================================== For clinical consultation, please call [phone]. ====================================================================     No results found for: CBDTHCR No results found  for: D8THCCBX No results found for: D9THCCBX  ROS  Constitutional: Denies any fever or chills Gastrointestinal: No reported hemesis, hematochezia, vomiting, or acute GI distress Musculoskeletal: Denies any acute onset joint swelling, redness, loss of ROM, or weakness Neurological: No reported episodes of acute onset apraxia, aphasia, dysarthria, agnosia, amnesia, paralysis, loss of coordination, or loss of consciousness  Medication Review  Aspirin -Salicylamide-Caffeine, FLUoxetine , FreeStyle Libre 2 Sensor, acetaminophen , albuterol , amitriptyline , aspirin  EC, atorvastatin , clopidogrel , fluticasone -salmeterol, gabapentin , glipiZIDE, insulin  NPH-regular Human, linagliptin, lisinopril -hydrochlorothiazide , and oxyCODONE   History Review  Allergy: Ms. Almond is allergic to ibuprofen, nsaids, and percocet [oxycodone -acetaminophen ]. Drug: Ms. Fragoso  reports that she does not currently use drugs. Alcohol:  reports no history of alcohol use. Tobacco:  reports that she has been smoking cigarettes. She has  a 46 pack-year smoking history. She has never used smokeless tobacco. Social: Ms. Sausedo  reports that she has been smoking cigarettes. She has a 46 pack-year smoking history. She has never used smokeless tobacco. She reports that she does not currently use drugs. She reports that she does not drink alcohol. Medical:  has a past medical history of Asthma, Coronary artery disease, Diabetes mellitus without complication (HCC) (1990), Fibromyalgia, Hypertension, and PAD (peripheral artery disease) (HCC) (09/03/2022). Surgical: Ms. Arps  has a past surgical history that includes Cesarean section; Breast mass excision (Left); Cholecystectomy (2009); Breast excisional biopsy (Left, 1980's); Lower Extremity Angiography (Left, 09/03/2022); Lower Extremity Angiography (Right, 09/08/2022); Lower Extremity Angiography (Left, 01/18/2023); Lower Extremity Angiography (Left, 02/14/2023); and Lower Extremity  Angiography (Left, 06/27/2023). Family: family history includes Breast cancer in her paternal aunt; Breast cancer (age of onset: 68) in her sister; Cancer in her father, mother, and sister; Diabetes in her mother.  Laboratory Chemistry Profile   Renal Lab Results  Component Value Date   BUN 11 05/07/2024   CREATININE 0.80 05/07/2024   GFRAA >60 07/01/2020   GFRNONAA >60 05/07/2024    Hepatic Lab Results  Component Value Date   AST 20 12/12/2023   ALT 14 12/12/2023   ALBUMIN 4.1 12/12/2023   ALKPHOS 81 12/12/2023   LIPASE 69 (H) 04/28/2020    Electrolytes Lab Results  Component Value Date   NA 137 05/07/2024   K 4.0 05/07/2024   CL 100 05/07/2024   CALCIUM  10.0 05/07/2024   MG 1.9 12/31/2023    Bone Lab Results  Component Value Date   VD25OH 13.48 (L) 12/12/2023    Inflammation (CRP: Acute Phase) (ESR: Chronic Phase) Lab Results  Component Value Date   CRP 4 04/25/2024   ESRSEDRATE 52 (H) 04/25/2024         Note: Above Lab results reviewed.  Recent Imaging Review  VAS US  ABI WITH/WO TBI  LOWER EXTREMITY DOPPLER STUDY  Patient Name:  [place] Doig Stella  Date of Exam:   05/11/2024 Medical Rec #: [id number]        Accession #:    [id number] Date of Birth: December 02, 1955       Patient Gender: F Patient Age:   47 years Exam Location:  [facility] Procedure:      VAS US  ABI WITH/WO TBI Referring Phys: ORVIN DARING  --------------------------------------------------------------------------------   Indications: Claudication, ulceration, and peripheral artery disease.  High Risk Factors: Hypertension, hyperlipidemia, Diabetes.  Other Factors: Prior study showed a >20 mm Hg difference in systolic BP R > L.  Vascular Interventions: 02/14/2023 Mechanical thrombectomy of the left SFA and                         popliteal arteries with the [place] device                         5. Percutaneous transluminal angioplasty of the left SFA                         and  popliteal arteries with 5 mm diameter by 30 and a 5                         mm diameter by 10 cm length Lutonix drug-coated  angioplasty                         Balloon                         6. Stent placement to the left proximal SFA with a 6 mm                         diameter by 15 cm length Viabahn stent                         01/18/2023 Percutaneous transluminal angioplasty and stent                         placement left superficial femoral artery and popliteal                         4. Mechanical thrombectomy of the SFA and popliteal                         using the [place] Rex device.                         Percutaneous transluminal angioplasty of left SFA and                         proximal popliteal artery with a pair 4 mm diameter                         Lutonix drug-coated angioplasty balloons                         5. Stent placement to the left SFA with 6 mm diameter by                         25 cm length Viabahn stent                         Percutaneous transluminal angioplasty of right SFA and                         proximal popliteal artery with 4 mm diameter by 30 cm                         length Lutonix drug-coated angioplasty balloon                         5. Viabahn stent placement x2 to the right SFA and                         proximal popliteal artery with 6 mm diameter by 25 cm                         length and 6 mm diameter by 10 cm length Viabahn stents                           06/27/2023: Aortogram and Selective Left  Lower Extremity                         Angiogram. Mechanical Thrombectomy of the Left SFA and                         Popliteal Artery with the [place] Rex device. PTA of the                         Proximal Left SFA with 5 mm diameter Lutonix drug coated                         angioplasty balloon. Stent placement to the Left                         Popliteal Artery and Mid/Distal SFA with 6 mm diameter                          by 25 cm length Viabahn stent. Stent placement to the                         Left Proximal SFA with 6 mm diameter by 2.5 cm length                         Viabahn stent.  Performing Technologist: Donnice Charnley RVT    Examination Guidelines: A complete evaluation includes at minimum, Doppler waveform signals and systolic blood pressure reading at the level of bilateral brachial, anterior tibial, and posterior tibial arteries, when vessel segments are accessible. Bilateral testing is considered an integral part of a complete examination. Photoelectric Plethysmograph (PPG) waveforms and toe systolic pressure readings are included as required and additional duplex testing as needed. Limited examinations for reoccurring indications may be performed as noted.    ABI Findings: +---------+------------------+-----+---------+--------+ Right    Rt Pressure (mmHg)IndexWaveform Comment  +---------+------------------+-----+---------+--------+ Brachial 147                                      +---------+------------------+-----+---------+--------+ PTA      119               0.81 triphasic         +---------+------------------+-----+---------+--------+ DP       144               0.98 triphasic         +---------+------------------+-----+---------+--------+ Great Toe78                0.53                   +---------+------------------+-----+---------+--------+  +---------+------------------+-----+---------+-------+ Left     Lt Pressure (mmHg)IndexWaveform Comment +---------+------------------+-----+---------+-------+ Brachial 112                                     +---------+------------------+-----+---------+-------+ PTA      130               0.88 triphasic        +---------+------------------+-----+---------+-------+ DP       133  0.90 triphasic         +---------+------------------+-----+---------+-------+ Great Toe76                0.52                  +---------+------------------+-----+---------+-------+  +-------+-----------+-----------+------------+------------+ ABI/TBIToday's ABIToday's TBIPrevious ABIPrevious TBI +-------+-----------+-----------+------------+------------+ Right  0.98       0.53       0.95        0.64         +-------+-----------+-----------+------------+------------+ Left   0.90       0.52       0.90        0.61         +-------+-----------+-----------+------------+------------+     Bilateral ABIs appear essentially unchanged compared to prior study on 11/11/2023.   Summary: Right: Resting right ankle-brachial index is within normal range. The right toe-brachial index is abnormal.  Left: Resting left ankle-brachial index indicates mild left lower extremity arterial disease. The left toe-brachial index is abnormal.  *See table(s) above for measurements and observations.    Electronically signed by Selinda Gu MD on 05/14/2024 at 8:02:15 AM.      Final   Note: Reviewed        Physical Exam  Vitals: There were no vitals taken for this visit. BMI: Estimated body mass index is 40.43 kg/m as calculated from the following:   Height as of 04/25/24: 5' (1.524 m).   Weight as of 04/25/24: 207 lb (93.9 kg). Ideal: Patient weight not recorded General appearance: Well nourished, well developed, and well hydrated. In no apparent acute distress Mental status: Alert, oriented x 3 (person, place, & time)       Respiratory: No evidence of acute respiratory distress Eyes: PERLA   Assessment   Diagnosis Status  1. Chronic low back pain (1ry area of Pain) (Bilateral) w/o sciatica   2. Low back pain of over 3 months duration   3. Lumbar facet joint pain   4. Lumbar facet hypertrophy (Multilevel) (Bilateral)   5. Lumbar facet arthropathy   6. Spondylosis without myelopathy or radiculopathy,  lumbosacral region   7. Chronic anticoagulation (Plavix )    Controlled Controlled Controlled   Updated Problems: No problems updated.  Plan of Care  Problem-specific:  Assessment and Plan            Ms. Regenia Erck Forgey has a current medication list which includes the following long-term medication(s): advair diskus, albuterol , atorvastatin , fluoxetine , glipizide, insulin  nph-regular human, and tradjenta.  Pharmacotherapy (Medications Ordered): No orders of the defined types were placed in this encounter.  Orders:  No orders of the defined types were placed in this encounter.    Interventional Therapies  Risk Factors  Considerations  Medical Comorbidities:  Plavix  Anticoagulation: (Stop: 7-10 days  Restart: 2 hours)  CAD  T2IDDM  BA  HTN  RBBB  Anxiety/Depression  FM  MO (BMI>40)          Planned  Pending:   Therapeutic bilateral lumbar facet RFA #1 (without steroids) (once the patient's cellulitis resolves)    Under consideration:   Therapeutic bilateral lumbar facet RFA #1 (w/o STEROIDS)  Therapeutic bilateral Qutenza treatment #1    Completed:   Diagnostic bilateral lumbar facet MBB x2 (03/29/2024) (100/100/100 x 2 days/0) (hyperglycemia 2ry to steroids)   Therapeutic  Palliative (PRN) options:   None established   Completed by other providers:   Diagnostic bilateral L3-L5 lumbar facet MBB x1 (04/07/2023) by Delon Gins,  MD [facility])  Diagnostic bilateral L5-S1 TFESI x2 (12/30/2022, 03/04/2023) by Delon Gins, MD [facility])  Referral to neurology for lower extremity EMG/PNCV (diabetic peripheral neuropathy vs ischemic neuropathy) (study apparently done by Dr. Arthea Farrow)  (01/09/2024) [facility] neurology)      No follow-ups on file.    Recent Visits Date Type Provider Dept  04/25/24 Office Visit Tanya Glisson, MD [facility]  03/29/24 Procedure visit Tanya Glisson, MD [facility]  03/15/24 Office Visit  Tanya Glisson, MD [facility]  Showing recent visits within past 90 days and meeting all other requirements Future Appointments Date Type Provider Dept  06/04/24 Appointment Tanya Glisson, MD [facility]  Showing future appointments within next 90 days and meeting all other requirements  I discussed the assessment and treatment plan with the patient. The patient was provided an opportunity to ask questions and all were answered. The patient agreed with the plan and demonstrated an understanding of the instructions.  Patient advised to call back or seek an in-person evaluation if the symptoms or condition worsens.  Duration of encounter: *** minutes.  Total time on encounter, as per AMA guidelines included both the face-to-face and non-face-to-face time personally spent by the physician and/or other qualified health care professional(s) on the day of the encounter (includes time in activities that require the physician or other qualified health care professional and does not include time in activities normally performed by clinical staff). Physician's time may include the following activities when performed: Preparing to see the patient (e.g., pre-charting review of records, searching for previously ordered imaging, lab work, and nerve conduction tests) Review of prior analgesic pharmacotherapies. Reviewing PMP Interpreting ordered tests (e.g., lab work, imaging, nerve conduction tests) Performing post-procedure evaluations, including interpretation of diagnostic procedures Obtaining and/or reviewing separately obtained history Performing a medically appropriate examination and/or evaluation Counseling and educating the patient/family/caregiver Ordering medications, tests, or procedures Referring and communicating with other health care professionals (when not separately reported) Documenting clinical information in the electronic or other health  record Independently interpreting results (not separately reported) and communicating results to the patient/ family/caregiver Care coordination (not separately reported)  Note by: Glisson DELENA Tanya, MD (TTS and AI technology used. I apologize for any typographical errors that were not detected and corrected.) Date: 06/04/2024; Time: 8:02 PM

## 2024-06-04 ENCOUNTER — Ambulatory Visit: Attending: Pain Medicine | Admitting: Pain Medicine

## 2024-06-04 ENCOUNTER — Encounter: Payer: Self-pay | Admitting: Pain Medicine

## 2024-06-04 VITALS — BP 134/57 | HR 93 | Temp 97.2°F | Resp 18 | Ht 60.0 in | Wt 207.0 lb

## 2024-06-04 DIAGNOSIS — Z7901 Long term (current) use of anticoagulants: Secondary | ICD-10-CM | POA: Insufficient documentation

## 2024-06-04 DIAGNOSIS — M545 Low back pain, unspecified: Secondary | ICD-10-CM | POA: Insufficient documentation

## 2024-06-04 DIAGNOSIS — L03115 Cellulitis of right lower limb: Secondary | ICD-10-CM | POA: Insufficient documentation

## 2024-06-04 DIAGNOSIS — M47816 Spondylosis without myelopathy or radiculopathy, lumbar region: Secondary | ICD-10-CM | POA: Diagnosis present

## 2024-06-04 DIAGNOSIS — E66813 Obesity, class 3: Secondary | ICD-10-CM | POA: Insufficient documentation

## 2024-06-04 DIAGNOSIS — Z6841 Body Mass Index (BMI) 40.0 and over, adult: Secondary | ICD-10-CM

## 2024-06-04 DIAGNOSIS — M47817 Spondylosis without myelopathy or radiculopathy, lumbosacral region: Secondary | ICD-10-CM | POA: Insufficient documentation

## 2024-06-04 DIAGNOSIS — L03119 Cellulitis of unspecified part of limb: Secondary | ICD-10-CM | POA: Diagnosis present

## 2024-06-04 DIAGNOSIS — G8929 Other chronic pain: Secondary | ICD-10-CM | POA: Diagnosis present

## 2024-06-04 DIAGNOSIS — M5459 Other low back pain: Secondary | ICD-10-CM | POA: Insufficient documentation

## 2024-06-04 DIAGNOSIS — R937 Abnormal findings on diagnostic imaging of other parts of musculoskeletal system: Secondary | ICD-10-CM | POA: Insufficient documentation

## 2024-06-04 NOTE — Progress Notes (Signed)
 Safety precautions to be maintained throughout the outpatient stay will include: orient to surroundings, keep bed in low position, maintain call bell within reach at all times, provide assistance with transfer out of bed and ambulation.

## 2024-06-04 NOTE — Patient Instructions (Addendum)
 ______________________________________________________________________    Procedure instructions  Stop blood-thinners  Do not eat or drink fluids (other than water) for 6 hours before your procedure  No water for 2 hours before your procedure  Take your blood pressure medicine with a sip of water  Arrive 30 minutes before your appointment  If sedation is planned, bring suitable driver. [place], [place], & public transportation are NOT APPROVED)  Carefully read the Preparing for your procedure detailed instructions  If you have questions call us  at [phone]  Procedure appointments are for procedures only.   NO medication refills or new problem evaluations will be done on procedure days.   Only the scheduled, pre-approved procedure and side will be done.   ______________________________________________________________________      ______________________________________________________________________    Preparing for your procedure  Appointments: If you think you may not be able to keep your appointment, call 24-48 hours in advance to cancel. We need time to make it available to others.  Procedure visits are for procedures only. During your procedure appointment there will be: NO Prescription Refills*. NO medication changes or discussions*. NO discussion of disability issues*. NO unrelated pain problem evaluations*. NO evaluations to order other pain procedures*. *These will be addressed at a separate and distinct evaluation encounter on the provider's evaluation schedule and not during procedure days.  Instructions: Food intake: Avoid eating anything solid for at least 8 hours prior to your procedure. Clear liquid intake: You may take clear liquids such as water up to 2 hours prior to your procedure. (No carbonated drinks. No soda.) Transportation: Unless otherwise stated by your physician, bring a driver. (Driver cannot be a Market researcher, Pharmacist, community, or any other form of public  transportation.) Morning Medicines: Except for blood thinners, take all of your other morning medications with a sip of water. Make sure to take your heart and blood pressure medicines. If your blood pressure's lower number is above 100, the case will be rescheduled. Blood thinners: Make sure to stop your blood thinners as instructed.  If you take a blood thinner, but were not instructed to stop it, call our office [phone] and ask to talk to a nurse. Not stopping a blood thinner prior to certain procedures could lead to serious complications. Diabetics on insulin : Notify the staff so that you can be scheduled 1st case in the morning. If your diabetes requires high dose insulin , take only  of your normal insulin  dose the morning of the procedure and notify the staff that you have done so. Preventing infections: Shower with an antibacterial soap the morning of your procedure.  Build-up your immune system: Take 1000 mg of Vitamin C with every meal (3 times a day) the day prior to your procedure. Antibiotics: Inform the nursing staff if you are taking any antibiotics or if you have any conditions that may require antibiotics prior to procedures. (Example: recent joint implants)   Pregnancy: If you are pregnant make sure to notify the nursing staff. Not doing so may result in injury to the fetus, including death.  Sickness: If you have a cold, fever, or any active infections, call and cancel or reschedule your procedure. Receiving steroids while having an infection may result in complications. Arrival: You must be in the facility at least 30 minutes prior to your scheduled procedure. Tardiness: Your scheduled time is also the cutoff time. If you do not arrive at least 15 minutes prior to your procedure, you will be rescheduled.  Children: Do not bring any children  with you. Make arrangements to keep them home. Dress appropriately: There is always a possibility that your clothing may get soiled. Avoid  long dresses. Valuables: Do not bring any jewelry or valuables.  Reasons to call and reschedule or cancel your procedure: (Following these recommendations will minimize the risk of a serious complication.) Surgeries: Avoid having procedures within 2 weeks of any surgery. (Avoid for 2 weeks before or after any surgery). Flu Shots: Avoid having procedures within 2 weeks of a flu shots or . (Avoid for 2 weeks before or after immunizations). Barium: Avoid having a procedure within 7-10 days after having had a radiological study involving the use of radiological contrast. (Myelograms, Barium swallow or enema study). Heart attacks: Avoid any elective procedures or surgeries for the initial 6 months after a Myocardial Infarction (Heart Attack). Blood thinners: It is imperative that you stop these medications before procedures. Let us  know if you if you take any blood thinner.  Infection: Avoid procedures during or within two weeks of an infection (including chest colds or gastrointestinal problems). Symptoms associated with infections include: Localized redness, fever, chills, night sweats or profuse sweating, burning sensation when voiding, cough, congestion, stuffiness, runny nose, sore throat, diarrhea, nausea, vomiting, cold or Flu symptoms, recent or current infections. It is specially important if the infection is over the area that we intend to treat. Heart and lung problems: Symptoms that may suggest an active cardiopulmonary problem include: cough, chest pain, breathing difficulties or shortness of breath, dizziness, ankle swelling, uncontrolled high or unusually low blood pressure, and/or palpitations. If you are experiencing any of these symptoms, cancel your procedure and contact your primary care physician for an evaluation.  Remember:  Regular Business hours are:  Monday to Thursday 8:00 AM to 4:00 PM  Provider's Schedule: Eric Como, MD:  Procedure days: Tuesday and Thursday 7:30  AM to 4:00 PM  Wallie Sherry, MD:  Procedure days: Monday and Wednesday 7:30 AM to 4:00 PM Last  Updated: 10/25/2023 ______________________________________________________________________      ______________________________________________________________________    General Risks and Possible Complications  Patient Responsibilities: It is important that you read this as it is part of your informed consent. It is our duty to inform you of the risks and possible complications associated with treatments offered to you. It is your responsibility as a patient to read this and to ask questions about anything that is not clear or that you believe was not covered in this document.  Patient's Rights: You have the right to refuse treatment. You also have the right to change your mind, even after initially having agreed to have the treatment done. However, under this last option, if you wait until the last second to change your mind, you may be charged for the materials used up to that point.  Introduction: Medicine is not an Visual merchandiser. Everything in Medicine, including the lack of treatment(s), carries the potential for danger, harm, or loss (which is by definition: Risk). In Medicine, a complication is a secondary problem, condition, or disease that can aggravate an already existing one. All treatments carry the risk of possible complications. The fact that a side effects or complications occurs, does not imply that the treatment was conducted incorrectly. It must be clearly understood that these can happen even when everything is done following the highest safety standards.  No treatment: You can choose not to proceed with the proposed treatment alternative. The "PRO(s)" would include: avoiding the risk of complications associated with the therapy. The "CON(s)"  would include: not getting any of the treatment benefits. These benefits fall under one of three categories: diagnostic; therapeutic; and/or  palliative. Diagnostic benefits include: getting information which can ultimately lead to improvement of the disease or symptom(s). Therapeutic benefits are those associated with the successful treatment of the disease. Finally, palliative benefits are those related to the decrease of the primary symptoms, without necessarily curing the condition (example: decreasing the pain from a flare-up of a chronic condition, such as incurable terminal cancer).  General Risks and Complications: These are associated to most interventional treatments. They can occur alone, or in combination. They fall under one of the following six (6) categories: no benefit or worsening of symptoms; bleeding; infection; nerve damage; allergic reactions; and/or death. No benefits or worsening of symptoms: In Medicine there are no guarantees, only probabilities. No healthcare provider can ever guarantee that a medical treatment will work, they can only state the probability that it may. Furthermore, there is always the possibility that the condition may worsen, either directly, or indirectly, as a consequence of the treatment. Bleeding: This is more common if the patient is taking a blood thinner, either prescription or over the counter (example: Goody Powders, Fish oil, Aspirin , Garlic, etc.), or if suffering a condition associated with impaired coagulation (example: Hemophilia, cirrhosis of the liver, low platelet counts, etc.). However, even if you do not have one on these, it can still happen. If you have any of these conditions, or take one of these drugs, make sure to notify your treating physician. Infection: This is more common in patients with a compromised immune system, either due to disease (example: diabetes, cancer, human immunodeficiency virus [HIV], etc.), or due to medications or treatments (example: therapies used to treat cancer and rheumatological diseases). However, even if you do not have one on these, it can still  happen. If you have any of these conditions, or take one of these drugs, make sure to notify your treating physician. Nerve Damage: This is more common when the treatment is an invasive one, but it can also happen with the use of medications, such as those used in the treatment of cancer. The damage can occur to small secondary nerves, or to large primary ones, such as those in the spinal cord and brain. This damage may be temporary or permanent and it may lead to impairments that can range from temporary numbness to permanent paralysis and/or brain death. Allergic Reactions: Any time a substance or material comes in contact with our body, there is the possibility of an allergic reaction. These can range from a mild skin rash (contact dermatitis) to a severe systemic reaction (anaphylactic reaction), which can result in death. Death: In general, any medical intervention can result in death, most of the time due to an unforeseen complication. ______________________________________________________________________     ______________________________________________________________________    Blood Thinners  IMPORTANT NOTICE:  If you take any of these, make sure to notify the nursing staff.  Failure to do so may result in serious injury.  Recommended time intervals to stop and restart blood-thinners, before & after invasive procedures  Generic Name Brand Name Pre-procedure: Stop medication for this amount of time before your procedure: Post-procedure: Wait this amount of time after the procedure before restarting your medication:  Abciximab Reopro 15 days 2 hrs  Alteplase Activase 10 days 10 days  Anagrelide Agrylin    Apixaban Eliquis 3 days 6 hrs  Cilostazol Pletal 3 days 5 hrs  Clopidogrel  Plavix  7-10 days 2 hrs  Dabigatran Pradaxa 5 days 6 hrs  Dalteparin Fragmin 24 hours 4 hrs  Dipyridamole Aggrenox 11days 2 hrs  Edoxaban Lixiana; Savaysa 3 days 2 hrs  Enoxaparin   Lovenox  24 hours 4 hrs   Eptifibatide Integrillin 8 hours 2 hrs  Fondaparinux  Arixtra 72 hours 12 hrs  Hydroxychloroquine Plaquenil 11 days   Prasugrel Effient 7-10 days 6 hrs  Reteplase Retavase 10 days 10 days  Rivaroxaban Xarelto 3 days 6 hrs  Ticagrelor Brilinta 5-7 days 6 hrs  Ticlopidine Ticlid 10-14 days 2 hrs  Tinzaparin Innohep 24 hours 4 hrs  Tirofiban Aggrastat 8 hours 2 hrs  Warfarin Coumadin 5 days 2 hrs   Other medications with blood-thinning effects  NOTE: Consider stopping these if you have prolonged bleeding despite not taking any of the above blood thinners. Otherwise ask your provider and this will be decided on a case-by-case basis.  Product indications Generic (Brand) names Note  Cholesterol Lipitor Stop 4 days before procedure  Blood thinner (injectable) Heparin  (LMW or LMWH Heparin ) Stop 24 hours before procedure  Cancer Ibrutinib (Imbruvica) Stop 7 days before procedure  Malaria/Rheumatoid Hydroxychloroquine (Plaquenil) Stop 11 days before procedure  Thrombolytics  10 days before or after procedures   Over-the-counter (OTC) Products with blood-thinning effects  NOTE: Consider stopping these if you have prolonged bleeding despite not taking any of the above blood thinners. Otherwise ask your provider and this will be decided on a case-by-case basis.  Product Common names Stop Time  Aspirin  > 325 mg Goody Powders, Excedrin, etc. 11 days  Aspirin  <= 81 mg  7 days  Fish oil  4 days  Garlic supplements  7 days  Ginkgo biloba  36 hours  Ginseng  24 hours  NSAIDs Ibuprofen, Naprosyn, etc. 3 days  Vitamin E  4 days   ______________________________________________________________________     ______________________________________________________________________    Patient information on: Body mass index (BMI) and Weight Management  Dear Ms. Olson you are receiving this information because your weight may be adversely affecting your health.   Your current Estimated body mass index  is 40.43 kg/m as calculated from the following:   Height as of this encounter: 5' (1.524 m).   Weight as of this encounter: 207 lb (93.9 kg).  We recommend you talk to your primary care physician about providing or referring you to a supervised weight management program.  Here is some information about weight and the body mass index (BMI) classification:  BMI is a measure of obesity that's calculated by dividing a person's weight in kilograms by their height in meters squared. A person can use an online calculator to determine their BMI. Body mass index (BMI) is a common tool for deciding whether a person has an appropriate body weight.  It measures a person's weight in relation to their height.  According to the [facility] of health (NIH): A BMI of less than 18.5 means that a person is underweight. A BMI of between 18.5 and 24.9 is ideal. A BMI of between 25 and 29.9 is overweight. A BMI over 30 indicates obesity.  Body Mass Index (BMI) Classification BMI level (kg/m2) Category Associated incidence of chronic pain  <18  Underweight   18.5-24.9 Ideal body weight   25-29.9 Overweight  20%  30-34.9 Obese (Class I)  68%  35-39.9 Severe obesity (Class II)  136%  >40 Extreme obesity (Class III)  254%    Morbidly Obese Classification: You will be considered to be Morbidly Obese if your BMI is  above 30 and you have one or more of the following conditions caused or associated to obesity: 1.    Type 2 Diabetes (Leading to cardiovascular diseases (CVD), stroke, peripheral vascular diseases (PVD), retinopathy, nephropathy, and neuropathy) 2.    Cardiovascular Disease (High Blood Pressure; Congestive Heart Failure; High Cholesterol; Coronary Artery Disease; Angina; Arrhythmias, Dysrhythmias, or Heart Attacks) 3.    Breathing problems (Asthma; obesity-hypoventilation syndrome; obstructive sleep apnea; chronic inflammatory airway disease; reactive airway disease; or shortness of breath) 4.     Chronic kidney disease 5.    Liver disease (nonalcoholic fatty liver disease) 6.    High blood pressure 7.    Acid reflux (gastroesophageal reflux disease; heartburn) 8.    Osteoarthritis (OA) (affecting the hip(s), the knee(s) and/or the lower back) (usually requiring knee and/or hip replacements, as well as back surgeries) 9.    Low back pain (Lumbar Facet Syndrome; and/or Degenerative Disc Disease) 10.  Hip pain (Osteoarthritis of hip) (For every 1 lbs of added body weight, there is a 2 lbs increase in pressure inside of each hip articulation. 1:2 mechanical relationship) 11.  Knee pain (Osteoarthritis of knee) (For every 1 lbs of added body weight, there is a 4 lbs increase in pressure inside of each knee articulation. 1:4 mechanical relationship) (patients with a BMI>30 kg/m2 were 6.8 times more likely to develop knee OA than normal-weight individuals) 12.  Cancer: Epidemiological studies have shown that obesity is a risk factor for: post-menopausal breast cancer; cancers of the endometrium, colon and kidney cancer; malignant adenomas of the esophagus. Obese subjects have an approximately 1.5-3.5-fold increased risk of developing these cancers compared with normal-weight subjects, and it has been estimated that between 15 and 45% of these cancers can be attributed to overweight. More recent studies suggest that obesity may also increase the risk of other types of cancer, including pancreatic, hepatic and gallbladder cancer. (Ref: Obesity and cancer. Pischon T, Nthlings U, [employer] H. Proc Nutr Soc. 2008 May;67(2):128-45. doi: 10.1017/S0029665108006976.) The International Agency for Research on Cancer (IARC) has identified 13 cancers associated with overweight and obesity: meningioma, multiple myeloma, adenocarcinoma of the esophagus, and cancers of the thyroid, postmenopausal breast cancer, gallbladder, stomach, liver, pancreas, kidney, ovaries, uterus, colon and rectal (colorectal) cancers. 55 percent  of all cancers diagnosed in women and 24 percent of those diagnosed in men are associated with overweight and obesity.  Recommendation: If you have any of the above conditions it is urgent that you take a step back and concentrate in losing weight. Dedicate 100% of your efforts on this task. Nothing else will improve your health more than bringing your weight down and your BMI to less than 30.   Nutritionist and/or supervised weight-management program: We are aware that most chronic pain patients are unable to exercise secondary to their pain. For this reason, you must rely on proper nutrition and diet in order to lose the weight. We recommend you talk to a nutritionist.   Bariatric surgery: A person might be considered a candidate for bariatric surgery if they meet one of the following BMI criteria:  BMI of 40 or higher: This is considered extreme obesity (Class III). BMI of 35-39.9: This is considered obesity, and the person might also have a serious weight-related health condition, such as high blood pressure, type 2 diabetes, or severe sleep apnea  BMI of 30-34.9: This might be considered if the person has serious weight-related health problems and hasn't had substantial weight loss or improvement in co-morbidities through other  methods   On your own: A realistic goal is to lose 10% of your body weight over a period of 12 months.  If over a period of six (6) months you have unsuccessfully tried to lose weight, then it is time for you to seek professional help and to enter a medically supervised weight management program, and/or undergo bariatric surgery.   Pain management considerations and possible limitations:  1.    Pharmacological Problems: Be advised that the use of opioid analgesics (oxycodone ; hydrocodone ; morphine ; methadone; codeine ; and all of their derivatives) have been associated with decreased metabolism and weight gain.  For this reason, should we see that you are unable to lose  weight while taking these medications, it may become necessary for us  to taper down and indefinitely discontinue them.  2.    Technical Problems: The incidence of successful interventional therapies decreases as the patient's BMI increases. It is much more difficult to accomplish a safe and effective interventional therapy on a patient with a BMI above 35. 3.    Radiation Exposure Problems: The x-rays machine, used to accomplish injection therapies, will automatically increase their x-ray output in order to capture an appropriate bone image. This means that radiation exposure increases exponentially with the patient's BMI. (The higher the BMI, the higher the radiation exposure.) Although the level of radiation used at a given time is still safe to the patient, it is not for the physician and/or assisting staff. Unfortunately, radiation exposure is accumulative. Because physicians and the staff have to do procedures and be exposed on a daily basis, this can result in health problems such as cancer and radiation burns. Radiation exposure to the staff is monitored by the radiation batches that they wear. The exposure levels are reported back to the staff on a quarterly basis. Depending on levels of exposure, physicians and staff may be obligated by law to decrease this exposure. This means that they have the right and obligation to refuse providing therapies where they may be overexposed to radiation. For this reason, physicians may decline to offer therapies such as radiofrequency ablation or implants to patients with a BMI above 40. 4.    Current Trends: Be advised that the current trend is to no longer offer certain therapies to patients with a BMI equal to, or above 35, due to increase perioperative risks, increased technical procedural difficulties, and excessive radiation exposure to healthcare personnel.  Last updated: 08/08/2023 ______________________________________________________________________

## 2024-06-07 ENCOUNTER — Ambulatory Visit (INDEPENDENT_AMBULATORY_CARE_PROVIDER_SITE_OTHER): Admitting: Nurse Practitioner

## 2024-06-07 VITALS — BP 169/83 | HR 85 | Resp 16

## 2024-06-07 DIAGNOSIS — M7989 Other specified soft tissue disorders: Secondary | ICD-10-CM

## 2024-06-07 NOTE — Progress Notes (Unsigned)
 Patient came out of right lower extremity unna wrap. Patient has no open wounds or swelling at this time. Patient will keep upcoming appointment as scheduled.

## 2024-06-08 ENCOUNTER — Encounter (INDEPENDENT_AMBULATORY_CARE_PROVIDER_SITE_OTHER): Payer: Self-pay | Admitting: Nurse Practitioner

## 2024-06-14 ENCOUNTER — Encounter (INDEPENDENT_AMBULATORY_CARE_PROVIDER_SITE_OTHER)

## 2024-06-14 ENCOUNTER — Ambulatory Visit (INDEPENDENT_AMBULATORY_CARE_PROVIDER_SITE_OTHER): Admitting: Nurse Practitioner

## 2024-06-21 ENCOUNTER — Ambulatory Visit (INDEPENDENT_AMBULATORY_CARE_PROVIDER_SITE_OTHER): Admitting: Nurse Practitioner

## 2024-09-05 ENCOUNTER — Emergency Department
Admission: EM | Admit: 2024-09-05 | Discharge: 2024-09-05 | Disposition: A | Discharge status: Home / Self Care | Attending: Emergency Medicine | Admitting: Emergency Medicine

## 2024-09-05 ENCOUNTER — Emergency Department

## 2024-09-05 ENCOUNTER — Encounter: Payer: Self-pay | Admitting: Intensive Care

## 2024-09-05 ENCOUNTER — Other Ambulatory Visit: Payer: Self-pay

## 2024-09-05 DIAGNOSIS — R1030 Lower abdominal pain, unspecified: Secondary | ICD-10-CM | POA: Diagnosis not present

## 2024-09-05 DIAGNOSIS — E119 Type 2 diabetes mellitus without complications: Secondary | ICD-10-CM | POA: Diagnosis not present

## 2024-09-05 DIAGNOSIS — R300 Dysuria: Secondary | ICD-10-CM | POA: Diagnosis present

## 2024-09-05 LAB — COMPREHENSIVE METABOLIC PANEL WITH GFR
ALT: 12 U/L (ref 0–44)
AST: 24 U/L (ref 15–41)
Albumin: 3.6 g/dL (ref 3.5–5.0)
Alkaline Phosphatase: 73 U/L (ref 38–126)
Anion gap: 12 (ref 5–15)
BUN: 8 mg/dL (ref 8–23)
CO2: 23 mmol/L (ref 22–32)
Calcium: 9 mg/dL (ref 8.9–10.3)
Chloride: 103 mmol/L (ref 98–111)
Creatinine, Ser: 0.72 mg/dL (ref 0.44–1.00)
GFR, Estimated: >60 mL/min (ref >60)
Glucose, Bld: 167 mg/dL — ABNORMAL HIGH (ref 70–99)
Potassium: 3.6 mmol/L (ref 3.5–5.1)
Sodium: 138 mmol/L (ref 135–145)
Total Bilirubin: 0.5 mg/dL (ref 0.0–1.2)
Total Protein: 7 g/dL (ref 6.5–8.1)

## 2024-09-05 LAB — URINALYSIS, ROUTINE W REFLEX MICROSCOPIC
Bilirubin Urine: NEGATIVE
Glucose, UA: NEGATIVE mg/dL
Hgb urine dipstick: NEGATIVE
Ketones, ur: NEGATIVE mg/dL
Leukocytes,Ua: NEGATIVE
Nitrite: NEGATIVE
Protein, ur: NEGATIVE mg/dL
Specific Gravity, Urine: 1.006 (ref 1.005–1.030)
pH: 6 (ref 5.0–8.0)

## 2024-09-05 LAB — CBC WITH DIFFERENTIAL/PLATELET
Basophils Absolute: 0 K/uL (ref 0.0–0.1)
Basophils Relative: 0 %
Eosinophils Absolute: 0.1 K/uL (ref 0.0–0.5)
Eosinophils Relative: 1 %
HCT: 36.1 % (ref 36.0–46.0)
Hemoglobin: 11.9 g/dL — ABNORMAL LOW (ref 12.0–15.0)
Lymphocytes Relative: 40 %
Lymphs Abs: 3.8 K/uL (ref 0.7–4.0)
MCH: 31.8 pg (ref 26.0–34.0)
MCHC: 33 g/dL (ref 30.0–36.0)
MCV: 96.5 fL (ref 80.0–100.0)
Monocytes Absolute: 1.2 K/uL — ABNORMAL HIGH (ref 0.1–1.0)
Monocytes Relative: 12 %
Neutro Abs: 4.5 K/uL (ref 1.7–7.7)
Neutrophils Relative %: 47 %
Platelets: 479 K/uL — ABNORMAL HIGH (ref 150–400)
RBC: 3.74 MIL/uL — ABNORMAL LOW (ref 3.87–5.11)
RDW: 14.4 % (ref 11.5–15.5)
Smear Review: NORMAL
WBC: 9.6 K/uL (ref 4.0–10.5)
nRBC: 0 % (ref 0.0–0.2)

## 2024-09-05 NOTE — ED Triage Notes (Addendum)
 Patient c/o pain during urination. Reports she has been on multiple antibiotics for same. Patient reports she can feel a vibration in her body  Patient has chronic back pain and trouble getting around. Patient reports she had an aid and her aid got taken away. Reports she cannot do for herself at home at this time

## 2024-09-05 NOTE — Discharge Instructions (Signed)
 Your urine test is clear.  There are no signs of infection.  The CT scan also did not show any concerning findings in your bladder or kidneys.  Make an appointment to follow-up with the urologist.  Return to the ER for new, worsening, or persistent severe pain when you urinate, inability to urinate, vomiting, blood in the urine, fever, weakness, or any other new or worsening symptoms that concern you.

## 2024-09-05 NOTE — ED Provider Notes (Signed)
 [facility] Provider Note    Event Date/Time   First MD Initiated Contact with Patient 09/05/24 1813     (approximate)   History   Recurrent UTI   HPI  Kristie Olson is a 68 y.o. female with a history of of type 2 diabetes, PAD, osteoarthritis, diabetic neuropathy, chronic back pain who presents with persistent dysuria for the last month, described as a pain when she urinates, but also pain in her bladder and some pain in her flanks and back bilaterally.  The patient stated that she had some blood in the urine before but is not having any currently.  She states that she has had multiple courses of antibiotics including amoxicillin as well as nitrofurantoin, which she completed about 5 days ago.  She reports nausea but no vomiting.  She does not have any fever or chills, but reports generalized malaise and weakness.  The patient also reports some difficulty with her home situation.  She states that she previously had a home health aide, but this was taken away.  She states that she lives with her husband and her son is also present, but they often do not help her with her ADLs.  However, she does not feel in any acute danger at home.  She is able to walk with a walker.  I reviewed the past medical records.  The patient's most recent outpatient encounter in our system was on 7/21 with the pain medicine for evaluation of her chronic conditions.   Physical Exam   Triage Vital Signs: ED Triage Vitals  Encounter Vitals Group     BP 09/05/24 1623 133/72     Girls Systolic BP Percentile --      Girls Diastolic BP Percentile --      Boys Systolic BP Percentile --      Boys Diastolic BP Percentile --      Pulse Rate 09/05/24 1623 (!) 102     Resp 09/05/24 1623 18     Temp 09/05/24 1623 98.2 F (36.8 C)     Temp Source 09/05/24 1623 Oral     SpO2 09/05/24 1623 99 %     Weight 09/05/24 1624 208 lb (94.3 kg)     Height 09/05/24 1624 5' (1.524 m)     Head  Circumference --      Peak Flow --      Pain Score 09/05/24 1624 8     Pain Loc --      Pain Education --      Exclude from Growth Chart --     Most recent vital signs: Vitals:   09/05/24 1623  BP: 133/72  Pulse: (!) 102  Resp: 18  Temp: 98.2 F (36.8 C)  SpO2: 99%    General: Awake, relatively comfortable appearing, no distress.  CV:  Good peripheral perfusion.  Resp:  Normal effort.  Abd:  Soft with no focal tenderness.  No distention.  Other:  5/5 motor strength and intact sensation in bilateral upper and lower extremities.  No jaundice or scleral icterus.  Slightly dry mucous membranes.   ED Results / Procedures / Treatments   Labs (all labs ordered are listed, but only abnormal results are displayed) Labs Reviewed  CBC WITH DIFFERENTIAL/PLATELET - Abnormal; Notable for the following components:      Result Value   RBC 3.74 (*)    Hemoglobin 11.9 (*)    Platelets 479 (*)    Monocytes Absolute 1.2 (*)  All other components within normal limits  COMPREHENSIVE METABOLIC PANEL WITH GFR - Abnormal; Notable for the following components:   Glucose, Bld 167 (*)    All other components within normal limits  URINALYSIS, ROUTINE W REFLEX MICROSCOPIC - Abnormal; Notable for the following components:   Color, Urine STRAW (*)    APPearance CLEAR (*)    All other components within normal limits     EKG     RADIOLOGY  CT renal stone study: I independently viewed and interpreted the images; there are no visible ureteral stones.  Radiology report indicates no acute abnormality.  PROCEDURES:  Critical Care performed: No  Procedures   MEDICATIONS ORDERED IN ED: Medications - No data to display   IMPRESSION / MDM / ASSESSMENT AND PLAN / ED COURSE  I reviewed the triage vital signs and the nursing notes.  68 year old female with PMH as noted above presents with subacute symptoms of dysuria and suprapubic pain which have persisted despite multiple courses of  antibiotics for UTI.  The patient also reports some generalized weakness, increased difficulty with ambulation, and reports that some of her home assistance has been taken away and her family is not always helpful.  Differential diagnosis includes, but is not limited to, UTI, pyelonephritis, ureteral stone, other acute or subacute cystitis, bladder mass, other urologic issue, dehydration, electrolyte abnormality, AKI.  CMP and CBC show no acute findings.  Will obtain a urinalysis and CT for further evaluation.  Patient's presentation is most consistent with acute complicated illness / injury requiring diagnostic workup.  ----------------------------------------- 7:24 PM on 09/05/2024 -----------------------------------------  Urinalysis is clear.  There is no sign of infection.  The CT also shows no acute abnormalities.  On reassessment, the patient continues to be comfortable appearing.  I discussed the results of the workup with her and she is reassured.  I think she will benefit from follow-up with urology due to her persistent dysuria.  There is no indication for continuing antibiotics or other acute treatment.  I had further discussion with the patient about her disposition.  She confirms that she feels safe at home.  She feels comfortable with discharge.  One of her family members is now here to bring her home.  She has no desire for social work evaluation or SNF placement.  At this time, the patient is stable for discharge home.  I gave strict return precautions and she expressed understanding.   FINAL CLINICAL IMPRESSION(S) / ED DIAGNOSES   Final diagnoses:  Dysuria     Rx / DC Orders   ED Discharge Orders     None        Note:  This document was prepared using Dragon voice recognition software and may include unintentional dictation errors.    Jacolyn Pae, MD 09/05/24 1932

## 2024-09-11 ENCOUNTER — Other Ambulatory Visit (INDEPENDENT_AMBULATORY_CARE_PROVIDER_SITE_OTHER): Payer: Self-pay | Admitting: Nurse Practitioner

## 2024-10-18 ENCOUNTER — Other Ambulatory Visit (INDEPENDENT_AMBULATORY_CARE_PROVIDER_SITE_OTHER): Payer: Self-pay | Admitting: Nurse Practitioner

## 2024-10-18 DIAGNOSIS — I739 Peripheral vascular disease, unspecified: Secondary | ICD-10-CM

## 2024-10-30 ENCOUNTER — Encounter (INDEPENDENT_AMBULATORY_CARE_PROVIDER_SITE_OTHER): Payer: Self-pay | Admitting: Nurse Practitioner

## 2024-10-30 ENCOUNTER — Other Ambulatory Visit (INDEPENDENT_AMBULATORY_CARE_PROVIDER_SITE_OTHER)

## 2024-10-30 ENCOUNTER — Ambulatory Visit (INDEPENDENT_AMBULATORY_CARE_PROVIDER_SITE_OTHER): Admitting: Nurse Practitioner

## 2024-10-30 VITALS — BP 116/78 | HR 88 | Resp 17 | Ht 60.0 in | Wt 208.0 lb

## 2024-10-30 DIAGNOSIS — F17200 Nicotine dependence, unspecified, uncomplicated: Secondary | ICD-10-CM

## 2024-10-30 DIAGNOSIS — E0865 Diabetes mellitus due to underlying condition with hyperglycemia: Secondary | ICD-10-CM | POA: Diagnosis not present

## 2024-10-30 DIAGNOSIS — I739 Peripheral vascular disease, unspecified: Secondary | ICD-10-CM

## 2024-10-30 DIAGNOSIS — M5416 Radiculopathy, lumbar region: Secondary | ICD-10-CM | POA: Diagnosis not present

## 2024-10-30 DIAGNOSIS — Z9889 Other specified postprocedural states: Secondary | ICD-10-CM | POA: Diagnosis not present

## 2024-10-30 NOTE — Progress Notes (Signed)
 Subjective:    Patient ID: Kristie Olson, female    DOB: [birth year], 68 y.o.   MRN: [id number] Chief Complaint  Patient presents with   Follow-up    6 months + ABI     HPI  Discussed the use of AI scribe software for clinical note transcription with the patient, who gave verbal consent to proceed.  History of Present Illness Kristie Olson is a 68 year old female who presents with right groin pain.  She experiences intermittent right groin pain that occurs while sitting, lying, and standing. The pain is not associated with activity and persists for a while before subsiding and then returning. No changes in foot temperature or color.  She has a history of back problems and has received multiple injections for pain management, which have not provided relief. Her back pain is severe, limiting her ability to walk more than two minutes before feeling like her back is going to 'break'. She experiences spasms and severe pain after standing for about five minutes.  She has undergone previous evaluations for blood flow, with recent measurements showing a right leg blood flow of 0.92 and left leg of 0.96.  She has severe nerve damage in her feet, leading to symptoms of numbness and pain, consistent with neuropathy. She reports being both numb and in pain.  She is frustrated with her current situation, including financial difficulties and issues with her insurance coverage, which have impacted her access to care and treatments.    Results Diagnostic Lower extremity arterial duplex ABI (10/30/2024): Right ABI 0.92, mild arterial disease, stable compared to prior. Left ABI 0.96, mild arterial disease, stable compared to prior.   Review of Systems     Objective:   Physical Exam  Physical Exam   BP 116/78   Pulse 88   Resp 17   Ht 5' (1.524 m)   Wt 208 lb (94.3 kg)   BMI 40.62 kg/m   Past Medical History:  Diagnosis Date   Asthma    Coronary artery disease    Diabetes  mellitus without complication (HCC) 1990   Fibromyalgia    Hypertension    PAD (peripheral artery disease) 09/03/2022    Social History   Socioeconomic History   Marital status: Married    Spouse name: Not on file   Number of children: Not on file   Years of education: Not on file   Highest education level: Not on file  Occupational History   Not on file  Tobacco Use   Smoking status: Every Day    Current packs/day: 1.00    Average packs/day: 1 pack/day for 46.0 years (46.0 ttl pk-yrs)    Types: Cigarettes   Smokeless tobacco: Never   Tobacco comments:    Since patient was 68 years old  Vaping Use   Vaping status: Never Used  Substance and Sexual Activity   Alcohol use: No   Drug use: Not Currently   Sexual activity: Not on file  Other Topics Concern   Not on file  Social History Narrative   Lives with husband, Ruebin and son, Fonda. No pets.   Social Drivers of Health   Tobacco Use: High Risk (10/30/2024)   Patient History    Smoking Tobacco Use: Every Day    Smokeless Tobacco Use: Never    Passive Exposure: Not on file  Financial Resource Strain: Not on file  Food Insecurity: No Food Insecurity (12/30/2023)   Hunger Vital Sign    Worried  About Running Out of Food in the Last Year: Never true    Ran Out of Food in the Last Year: Never true  Transportation Needs: No Transportation Needs (12/30/2023)   PRAPARE - Administrator, Civil Service (Medical): No    Lack of Transportation (Non-Medical): No  Physical Activity: Not on file  Stress: Not on file  Social Connections: Moderately Integrated (12/30/2023)   Social Connection and Isolation Panel    Frequency of Communication with Friends and Family: More than three times a week    Frequency of Social Gatherings with Friends and Family: Twice a week    Attends Religious Services: More than 4 times per year    Active Member of [employer] or Organizations: No    Attends Banker Meetings: Never     Marital Status: Married  Catering Manager Violence: Not At Risk (12/30/2023)   Humiliation, Afraid, Rape, and Kick questionnaire    Fear of Current or Ex-Partner: No    Emotionally Abused: No    Physically Abused: No    Sexually Abused: No  Depression (PHQ2-9): Low Risk (06/04/2024)   Depression (PHQ2-9)    PHQ-2 Score: 1  Alcohol Screen: Not on file  Housing: Unknown (01/09/2024)   Received from [facility]   Epic    Unable to Pay for Housing in the Last Year: Not on file    Number of Times Moved in the Last Year: Not on file    At any time in the past 12 months, were you homeless or living in a shelter (including now)?: No  Utilities: Not At Risk (12/30/2023)   [employer] Utilities    Threatened with loss of utilities: No  Health Literacy: Not on file    Past Surgical History:  Procedure Laterality Date   BREAST EXCISIONAL BIOPSY Left 1980's   neg   BREAST MASS EXCISION Left    age 86s, benign   CESAREAN SECTION     CHOLECYSTECTOMY  2009   LOWER EXTREMITY ANGIOGRAPHY Left 09/03/2022   Procedure: Lower Extremity Angiography;  Surgeon: Marea Selinda RAMAN, MD;  Location: [facility];  Service: Cardiovascular;  Laterality: Left;   LOWER EXTREMITY ANGIOGRAPHY Right 09/08/2022   Procedure: Lower Extremity Angiography;  Surgeon: Marea Selinda RAMAN, MD;  Location: [facility];  Service: Cardiovascular;  Laterality: Right;   LOWER EXTREMITY ANGIOGRAPHY Left 01/18/2023   Procedure: Lower Extremity Angiography;  Surgeon: Jama Cordella MATSU, MD;  Location: [facility];  Service: Cardiovascular;  Laterality: Left;   LOWER EXTREMITY ANGIOGRAPHY Left 02/14/2023   Procedure: Lower Extremity Angiography;  Surgeon: Marea Selinda RAMAN, MD;  Location: [facility];  Service: Cardiovascular;  Laterality: Left;   LOWER EXTREMITY ANGIOGRAPHY Left 06/27/2023   Procedure: Lower Extremity Angiography;  Surgeon: Marea Selinda RAMAN, MD;  Location: [facility];  Service:  Cardiovascular;  Laterality: Left;    Family History  Problem Relation Age of Onset   Cancer Mother    Diabetes Mother    Cancer Father    Cancer Sister    Breast cancer Sister 42   Breast cancer Paternal Aunt     Allergies[1]     Latest Ref Rng & Units 09/05/2024    4:26 PM 05/07/2024    4:39 PM 04/25/2024   10:37 AM  CBC  WBC 4.0 - 10.5 K/uL 9.6  9.1  8.1   Hemoglobin 12.0 - 15.0 g/dL 88.0  86.0  86.4  Hematocrit 36.0 - 46.0 % 36.1  42.1  40.8   Platelets 150 - 400 K/uL 479  464  457       CMP     Component Value Date/Time   NA 138 09/05/2024 1626   NA 132 (L) 07/14/2013 2143   K 3.6 09/05/2024 1626   K 4.3 07/14/2013 2143   CL 103 09/05/2024 1626   CL 95 (L) 07/14/2013 2143   CO2 23 09/05/2024 1626   CO2 31 07/14/2013 2143   GLUCOSE 167 (H) 09/05/2024 1626   GLUCOSE 433 (H) 07/14/2013 2143   BUN 8 09/05/2024 1626   BUN 12 07/14/2013 2143   CREATININE 0.72 09/05/2024 1626   CREATININE 0.91 07/14/2013 2143   CALCIUM  9.0 09/05/2024 1626   CALCIUM  9.3 07/14/2013 2143   PROT 7.0 09/05/2024 1626   PROT 7.9 07/14/2013 2143   ALBUMIN 3.6 09/05/2024 1626   ALBUMIN 3.7 07/14/2013 2143   AST 24 09/05/2024 1626   AST 16 07/14/2013 2143   ALT 12 09/05/2024 1626   ALT 20 07/14/2013 2143   ALKPHOS 73 09/05/2024 1626   ALKPHOS 118 07/14/2013 2143   BILITOT 0.5 09/05/2024 1626   BILITOT 0.3 07/14/2013 2143   GFRNONAA >60 09/05/2024 1626   GFRNONAA >60 07/14/2013 2143     No results found.     Assessment & Plan:   1. Peripheral arterial disease with history of revascularization (Primary)  Recommend:  The patient has evidence of atherosclerosis of the lower extremities.  The patient does not voice lifestyle limiting changes at this point in time.  Noninvasive studies do not suggest clinically significant change.  No invasive studies, angiography or surgery at this time The patient should continue walking and begin a more formal exercise program.  The  patient should continue antiplatelet therapy and aggressive treatment of the lipid abnormalities  No changes in the patient's medications at this time  Continued surveillance is indicated as atherosclerosis is likely to progress with time.    The patient will continue follow up with noninvasive studies as ordered.   2. Tobacco use disorder Tobacco use disorder Continued tobacco use despite previous advice. Smoking cessation crucial for vascular health. - Advised smoking cessation to improve vascular health.  3. Lumbar radiculitis ntermittent right leg and groin pain likely due to back issues and potential nerve involvement, not blood flow. Previous interventions ineffective. - In reviewing pain management notes, patient was recommended to have radiofrequency ablation once cellulitis resolved.  - Monitor for sudden changes in foot temperature or color.   4. Diabetes mellitus due to underlying condition with hyperglycemia, unspecified whether long term insulin  use (HCC) Continue hypoglycemic medications as already ordered, these medications have been reviewed and there are no changes at this time.  Hgb A1C to be monitored as already arranged by primary service    Medications Ordered Prior to Encounter[2]  There are no Patient Instructions on file for this visit. No follow-ups on file.   Mariadejesus Cade E Antwone Capozzoli, NP      [1]  Allergies Allergen Reactions   Ibuprofen Other (See Comments)    STOMACHACHE   Nsaids     Other reaction(s): Other (See Comments) PUD   Percocet [Oxycodone -Acetaminophen ] Itching    Patient states Percocet makes me itch all over  [2]  Current Outpatient Medications on File Prior to Visit  Medication Sig Dispense Refill   acetaminophen  (TYLENOL ) 325 MG tablet Take 2 tablets (650 mg total) by mouth every 6 (six) hours as needed  for mild pain (or Fever >/= 101).     albuterol  (PROVENTIL  HFA;VENTOLIN  HFA) 108 (90 Base) MCG/ACT inhaler Inhale 2 puffs into the  lungs every 4 (four) hours as needed for wheezing or shortness of breath.     amitriptyline  (ELAVIL ) 100 MG tablet Take 10 mg by mouth at bedtime as needed for sleep.     aspirin  EC 81 MG tablet Take 1 tablet (81 mg total) by mouth daily. Swallow whole. 30 tablet 0   Aspirin -Salicylamide-Caffeine (BC HEADACHE POWDER PO) Take by mouth.     atorvastatin  (LIPITOR) 10 MG tablet Take 1 tablet (10 mg total) by mouth daily. 30 tablet 0   clopidogrel  (PLAVIX ) 75 MG tablet Take 1 tablet by mouth once daily 90 tablet 0   Continuous Blood Gluc Sensor (FREESTYLE LIBRE 2 SENSOR) MISC USE AS DIRECTED EVERY 2 WEEKS     FLUoxetine  (PROZAC ) 40 MG capsule Take 1 capsule by mouth daily.     gabapentin  (NEURONTIN ) 400 MG capsule Take 400 mg by mouth 3 (three) times daily.     glipiZIDE (GLUCOTROL) 10 MG tablet Take 10 mg by mouth daily as needed.     insulin  NPH-regular Human (70-30) 100 UNIT/ML injection Inject 25-70 Units into the skin in the morning and at bedtime. 70 qam 25 qhs     lisinopril -hydrochlorothiazide  (ZESTORETIC ) 20-25 MG tablet Take 1 tablet by mouth daily.     oxyCODONE  (ROXICODONE ) 5 MG immediate release tablet Take 1 tablet (5 mg total) by mouth every 8 (eight) hours as needed. 7 tablet 0   TRADJENTA 5 MG TABS tablet Take 5 mg by mouth daily.     No current facility-administered medications on file prior to visit.

## 2024-10-31 LAB — VAS US ABI WITH/WO TBI
Left ABI: 0.96
Right ABI: 0.92

## 2025-04-30 ENCOUNTER — Ambulatory Visit (INDEPENDENT_AMBULATORY_CARE_PROVIDER_SITE_OTHER): Admitting: Nurse Practitioner

## 2025-04-30 ENCOUNTER — Encounter (INDEPENDENT_AMBULATORY_CARE_PROVIDER_SITE_OTHER)
